# Patient Record
Sex: Female | Born: 1945 | Hispanic: No | Smoking: Former smoker
Health system: Southern US, Community
[De-identification: ages and names within clinical notes are randomized; demographics above are authoritative.]

## PROBLEM LIST (undated history)

## (undated) DIAGNOSIS — R0600 Dyspnea, unspecified: Secondary | ICD-10-CM

## (undated) DIAGNOSIS — I10 Essential (primary) hypertension: Secondary | ICD-10-CM

## (undated) DIAGNOSIS — I4891 Unspecified atrial fibrillation: Secondary | ICD-10-CM

## (undated) DIAGNOSIS — K900 Celiac disease: Secondary | ICD-10-CM

## (undated) DIAGNOSIS — R011 Cardiac murmur, unspecified: Secondary | ICD-10-CM

## (undated) DIAGNOSIS — R0609 Other forms of dyspnea: Secondary | ICD-10-CM

## (undated) DIAGNOSIS — K635 Polyp of colon: Secondary | ICD-10-CM

## (undated) DIAGNOSIS — M1990 Unspecified osteoarthritis, unspecified site: Secondary | ICD-10-CM

## (undated) DIAGNOSIS — I249 Acute ischemic heart disease, unspecified: Secondary | ICD-10-CM

## (undated) DIAGNOSIS — K219 Gastro-esophageal reflux disease without esophagitis: Secondary | ICD-10-CM

## (undated) DIAGNOSIS — Z8489 Family history of other specified conditions: Secondary | ICD-10-CM

## (undated) DIAGNOSIS — G4700 Insomnia, unspecified: Secondary | ICD-10-CM

## (undated) DIAGNOSIS — M4800 Spinal stenosis, site unspecified: Secondary | ICD-10-CM

## (undated) DIAGNOSIS — T7840XA Allergy, unspecified, initial encounter: Secondary | ICD-10-CM

## (undated) HISTORY — DX: Gastro-esophageal reflux disease without esophagitis: K21.9

## (undated) HISTORY — DX: Celiac disease: K90.0

## (undated) HISTORY — PX: CHOLECYSTECTOMY: SHX55

## (undated) HISTORY — DX: Polyp of colon: K63.5

## (undated) HISTORY — DX: Allergy, unspecified, initial encounter: T78.40XA

## (undated) HISTORY — DX: Insomnia, unspecified: G47.00

## (undated) HISTORY — PX: ABDOMINAL HYSTERECTOMY: SHX81

## (undated) HISTORY — DX: Unspecified osteoarthritis, unspecified site: M19.90

## (undated) HISTORY — DX: Other forms of dyspnea: R06.09

---
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
## 1967-06-02 HISTORY — PX: APPENDECTOMY: SHX54

## 1967-06-02 HISTORY — PX: CHOLECYSTECTOMY: SHX55

## 2000-07-20 DIAGNOSIS — I10 Essential (primary) hypertension: Secondary | ICD-10-CM | POA: Insufficient documentation

## 2000-07-20 HISTORY — DX: Essential (primary) hypertension: I10

## 2002-02-24 DIAGNOSIS — J309 Allergic rhinitis, unspecified: Secondary | ICD-10-CM | POA: Insufficient documentation

## 2002-02-24 HISTORY — DX: Allergic rhinitis, unspecified: J30.9

## 2002-02-28 DIAGNOSIS — M19041 Primary osteoarthritis, right hand: Secondary | ICD-10-CM

## 2002-02-28 DIAGNOSIS — G5603 Carpal tunnel syndrome, bilateral upper limbs: Secondary | ICD-10-CM | POA: Insufficient documentation

## 2002-02-28 DIAGNOSIS — M65319 Trigger thumb, unspecified thumb: Secondary | ICD-10-CM

## 2002-02-28 HISTORY — DX: Primary osteoarthritis, right hand: M19.041

## 2002-02-28 HISTORY — DX: Trigger thumb, unspecified thumb: M65.319

## 2002-02-28 HISTORY — DX: Carpal tunnel syndrome, bilateral upper limbs: G56.03

## 2004-03-04 DIAGNOSIS — N812 Incomplete uterovaginal prolapse: Secondary | ICD-10-CM

## 2004-03-04 HISTORY — DX: Incomplete uterovaginal prolapse: N81.2

## 2004-12-25 DIAGNOSIS — J45909 Unspecified asthma, uncomplicated: Secondary | ICD-10-CM

## 2004-12-25 HISTORY — DX: Unspecified asthma, uncomplicated: J45.909

## 2005-04-17 DIAGNOSIS — Z1211 Encounter for screening for malignant neoplasm of colon: Secondary | ICD-10-CM

## 2005-04-17 HISTORY — DX: Encounter for screening for malignant neoplasm of colon: Z12.11

## 2006-02-15 DIAGNOSIS — R053 Chronic cough: Secondary | ICD-10-CM | POA: Insufficient documentation

## 2006-02-15 HISTORY — DX: Chronic cough: R05.3

## 2006-05-20 DIAGNOSIS — I447 Left bundle-branch block, unspecified: Secondary | ICD-10-CM | POA: Insufficient documentation

## 2006-05-20 HISTORY — DX: Left bundle-branch block, unspecified: I44.7

## 2006-07-12 DIAGNOSIS — K900 Celiac disease: Secondary | ICD-10-CM | POA: Insufficient documentation

## 2008-06-01 HISTORY — PX: ABDOMINAL HYSTERECTOMY: SHX81

## 2008-12-20 DIAGNOSIS — Z860101 Personal history of adenomatous and serrated colon polyps: Secondary | ICD-10-CM

## 2008-12-20 DIAGNOSIS — Z86010 Personal history of colonic polyps: Secondary | ICD-10-CM | POA: Insufficient documentation

## 2008-12-20 HISTORY — DX: Personal history of colonic polyps: Z86.010

## 2008-12-20 HISTORY — DX: Personal history of adenomatous and serrated colon polyps: Z86.0101

## 2009-02-27 DIAGNOSIS — N8110 Cystocele, unspecified: Secondary | ICD-10-CM | POA: Insufficient documentation

## 2009-02-27 HISTORY — DX: Cystocele, unspecified: N81.10

## 2009-05-10 DIAGNOSIS — M5090 Cervical disc disorder, unspecified, unspecified cervical region: Secondary | ICD-10-CM | POA: Insufficient documentation

## 2009-05-10 HISTORY — DX: Cervical disc disorder, unspecified, unspecified cervical region: M50.90

## 2014-11-22 DIAGNOSIS — M5416 Radiculopathy, lumbar region: Secondary | ICD-10-CM

## 2014-11-22 DIAGNOSIS — M48061 Spinal stenosis, lumbar region without neurogenic claudication: Secondary | ICD-10-CM

## 2014-11-22 HISTORY — DX: Spinal stenosis, lumbar region without neurogenic claudication: M48.061

## 2014-11-22 HISTORY — DX: Radiculopathy, lumbar region: M54.16

## 2015-01-29 DIAGNOSIS — M51369 Other intervertebral disc degeneration, lumbar region without mention of lumbar back pain or lower extremity pain: Secondary | ICD-10-CM

## 2015-01-29 DIAGNOSIS — M5136 Other intervertebral disc degeneration, lumbar region: Secondary | ICD-10-CM

## 2015-01-29 HISTORY — DX: Other intervertebral disc degeneration, lumbar region: M51.36

## 2015-01-29 HISTORY — DX: Other intervertebral disc degeneration, lumbar region without mention of lumbar back pain or lower extremity pain: M51.369

## 2015-03-21 DIAGNOSIS — I340 Nonrheumatic mitral (valve) insufficiency: Secondary | ICD-10-CM

## 2015-03-21 DIAGNOSIS — Z8669 Personal history of other diseases of the nervous system and sense organs: Secondary | ICD-10-CM

## 2015-03-21 HISTORY — DX: Nonrheumatic mitral (valve) insufficiency: I34.0

## 2015-03-21 HISTORY — DX: Personal history of other diseases of the nervous system and sense organs: Z86.69

## 2015-04-01 DIAGNOSIS — G894 Chronic pain syndrome: Secondary | ICD-10-CM | POA: Insufficient documentation

## 2015-04-01 HISTORY — DX: Chronic pain syndrome: G89.4

## 2015-05-23 DIAGNOSIS — I119 Hypertensive heart disease without heart failure: Secondary | ICD-10-CM | POA: Insufficient documentation

## 2015-05-23 DIAGNOSIS — I517 Cardiomegaly: Secondary | ICD-10-CM

## 2015-05-23 HISTORY — DX: Hypertensive heart disease without heart failure: I11.9

## 2015-05-23 HISTORY — DX: Cardiomegaly: I51.7

## 2015-08-19 DIAGNOSIS — J301 Allergic rhinitis due to pollen: Secondary | ICD-10-CM | POA: Insufficient documentation

## 2015-08-19 DIAGNOSIS — F411 Generalized anxiety disorder: Secondary | ICD-10-CM | POA: Insufficient documentation

## 2015-08-19 HISTORY — DX: Generalized anxiety disorder: F41.1

## 2015-08-19 HISTORY — DX: Allergic rhinitis due to pollen: J30.1

## 2015-11-22 DIAGNOSIS — M810 Age-related osteoporosis without current pathological fracture: Secondary | ICD-10-CM | POA: Insufficient documentation

## 2015-11-22 DIAGNOSIS — M8580 Other specified disorders of bone density and structure, unspecified site: Secondary | ICD-10-CM

## 2015-11-22 HISTORY — DX: Other specified disorders of bone density and structure, unspecified site: M85.80

## 2015-11-22 HISTORY — DX: Age-related osteoporosis without current pathological fracture: M81.0

## 2015-11-26 DIAGNOSIS — G8929 Other chronic pain: Secondary | ICD-10-CM

## 2015-11-26 DIAGNOSIS — M5441 Lumbago with sciatica, right side: Secondary | ICD-10-CM | POA: Insufficient documentation

## 2015-11-26 HISTORY — DX: Other chronic pain: G89.29

## 2015-11-26 HISTORY — DX: Lumbago with sciatica, right side: M54.41

## 2015-12-30 ENCOUNTER — Ambulatory Visit (HOSPITAL_COMMUNITY)
Admission: EM | Admit: 2015-12-30 | Discharge: 2015-12-30 | Disposition: A | Discharge status: Home / Self Care | Payer: MEDICARE | Attending: Family Medicine | Admitting: Family Medicine

## 2015-12-30 ENCOUNTER — Encounter (HOSPITAL_COMMUNITY): Payer: Self-pay | Admitting: Emergency Medicine

## 2015-12-30 DIAGNOSIS — B029 Zoster without complications: Secondary | ICD-10-CM | POA: Diagnosis not present

## 2015-12-30 HISTORY — DX: Essential (primary) hypertension: I10

## 2015-12-30 HISTORY — DX: Spinal stenosis, site unspecified: M48.00

## 2015-12-30 MED ORDER — VALACYCLOVIR HCL 1 G PO TABS: 1000.0000 mg | ORAL_TABLET | Freq: Three times a day (TID) | ORAL | 0 refills | Status: AC

## 2015-12-30 MED ORDER — PREDNISONE 10 MG PO TABS: 20.0000 mg | ORAL_TABLET | Freq: Every day | ORAL | 0 refills | Status: DC

## 2015-12-30 MED ORDER — CAPSAICIN-MENTHOL-METHYL SAL 0.025-1-12 % EX CREA: 1.0000 | TOPICAL_CREAM | Freq: Four times a day (QID) | CUTANEOUS | 2 refills | Status: DC | PRN

## 2015-12-30 NOTE — ED Provider Notes (Signed)
CSN: [id number]     Arrival date & time 12/30/15  1153 History   None    No chief complaint on file.  (Consider location/radiation/quality/duration/timing/severity/associated sxs/prior Treatment) Patient awoke with painful rash right side of back 2 days ago.   The history is provided by the patient.  Rash  Location:  Torso Quality: blistering, burning, itchiness and redness   Severity:  Moderate Onset quality:  Sudden Duration:  2 days Timing:  Constant Progression:  Worsening Chronicity:  New Relieved by:  Nothing Worsened by:  Nothing Ineffective treatments:  None tried   No past medical history on file. No past surgical history on file. No family history on file. Social History  Substance Use Topics  . Smoking status: Not on file  . Smokeless tobacco: Not on file  . Alcohol use Not on file   OB History    No data available     Review of Systems  Constitutional: Negative.   HENT: Negative.   Eyes: Negative.   Respiratory: Negative.   Cardiovascular: Negative.   Gastrointestinal: Negative.   Endocrine: Negative.   Genitourinary: Negative.   Musculoskeletal: Negative.   Skin: Positive for rash.  Allergic/Immunologic: Negative.   Neurological: Positive for numbness.  Hematological: Negative.   Psychiatric/Behavioral: Negative.     Allergies  Review of patient's allergies indicates not on file.  Home Medications   Prior to Admission medications   Not on File   Meds Ordered and Administered this Visit  Medications - No data to display  BP 143/79 (BP Location: Left Arm)   Pulse 76   Temp 97.9 F (36.6 C) (Oral)   Resp 12   SpO2 95%  No data found.   Physical Exam  Constitutional: She appears well-developed and well-nourished.  HENT:  Head: Normocephalic and atraumatic.  Eyes: EOM are normal. Pupils are equal, round, and reactive to light.  Neck: Normal range of motion. Neck supple.  Cardiovascular: Normal rate, regular rhythm and normal  heart sounds.   Pulmonary/Chest: Effort normal and breath sounds normal.  Abdominal: Soft. Bowel sounds are normal.  Skin: Rash noted. There is erythema.  Erythematous vesicle patches right back  Nursing note and vitals reviewed.   Urgent Care Course   Clinical Course    Procedures (including critical care time)  Labs Review Labs Reviewed - No data to display  Imaging Review No results found.   Visual Acuity Review  Right Eye Distance:   Left Eye Distance:   Bilateral Distance:    Right Eye Near:   Left Eye Near:    Bilateral Near:         MDM  Herpes Zoster  Prednisone 10 mg 2 po bid x 4 days #16 Valtrex 1gram one po tid x 7 days #21 Continue using hydrocodone for pain Capsaicin cream apply  Qid prn pain #60 grams w/2rf      Lysbeth Penner, FNP 12/30/15 1305

## 2015-12-30 NOTE — ED Triage Notes (Signed)
Patient reports rash to right side of body noticed 7/29.  Patient concerned for shingles

## 2016-01-01 ENCOUNTER — Encounter (HOSPITAL_COMMUNITY): Payer: Self-pay | Admitting: Emergency Medicine

## 2016-01-01 ENCOUNTER — Ambulatory Visit (HOSPITAL_COMMUNITY)
Admission: EM | Admit: 2016-01-01 | Discharge: 2016-01-01 | Disposition: A | Discharge status: Home / Self Care | Payer: MEDICARE | Attending: Family Medicine | Admitting: Family Medicine

## 2016-01-01 DIAGNOSIS — K5903 Drug induced constipation: Secondary | ICD-10-CM

## 2016-01-01 DIAGNOSIS — B0229 Other postherpetic nervous system involvement: Secondary | ICD-10-CM | POA: Diagnosis not present

## 2016-01-01 MED ORDER — GABAPENTIN 300 MG PO CAPS: 300.0000 mg | ORAL_CAPSULE | Freq: Three times a day (TID) | ORAL | 0 refills | Status: DC

## 2016-01-01 MED ORDER — KETOROLAC TROMETHAMINE 30 MG/ML IJ SOLN
INTRAMUSCULAR | Status: AC
  Filled 2016-01-01: qty 1

## 2016-01-01 MED ORDER — KETOROLAC TROMETHAMINE 30 MG/ML IJ SOLN
30.0000 mg | Freq: Once | INTRAMUSCULAR | Status: AC
  Administered 2016-01-01: 30 mg via INTRAMUSCULAR

## 2016-01-01 NOTE — Discharge Instructions (Signed)
1. Drink at least 8 large glasses of water daily and continue regular activities as                 tolerated.  2. With each subsequent day with dealing with the constipation, please continue all the                ones that was/were started the previous day and add 1 new thing each day  A 1 cup of prune juice and orange juice (warmed) orally twice daily  B. mirlax 17 grams in large glass of water and follow with another glass of water                 up to twice daily.  C.senna-S 2 orally up to twice daily  D.mil of magnesia 30 ml orally up to twice daily  E. Fleet enema rectally as needed  3. Once your bowel has moved, you can cut down on the above to regular bowl habit. Most people should have a movement every 1-2 days 4. NOTE: Do not start a fiber supplement during an episode of constipation.

## 2016-01-01 NOTE — ED Triage Notes (Signed)
PT was seen here Monday and diagnosed with shingles. PT reports the pain medicine has made her constipated and she is having bad abdominal cramps. PT had a small BM today. PT has not taken pain medicine today due to constipation

## 2016-01-01 NOTE — ED Provider Notes (Signed)
CSN: [id number]     Arrival date & time 01/01/16  1218 History   First MD Initiated Contact with Patient 01/01/16 1400     Chief Complaint  Patient presents with  . Herpes Zoster   (Consider location/radiation/quality/duration/timing/severity/associated sxs/prior Treatment) HPI Patient is a 70 year old female with a history of shingles that was diagnosed at the urgent care yesterday. She states that she was offered an injection of ketorolac but declined at that time she would like to have it at this time. She also states that she is somewhat constipated from the hydrocodone that she takes on a regular basis states that she has used over-the-counter symptoms without any relief of symptoms at this time. Past Medical History:  Diagnosis Date  . Hypertension   . Spinal stenosis    Past Surgical History:  Procedure Laterality Date  . ABDOMINAL HYSTERECTOMY    . CHOLECYSTECTOMY     No family history on file. Social History  Substance Use Topics  . Smoking status: Former Smoker    Quit date: 12/31/1997  . Smokeless tobacco: Never Used  . Alcohol use No   OB History    No data available     Review of Systems  Denies: HEADACHE, NAUSEA,   CHEST PAIN, CONGESTION, DYSURIA, SHORTNESS OF BREATH  Allergies  Codeine  Home Medications   Prior to Admission medications   Medication Sig Start Date End Date Taking? Authorizing Provider  AMLODIPINE BESYLATE PO Take 25 mg by mouth.    Historical Provider, MD  Capsaicin-Menthol-Methyl Sal (CAPSAICIN-METHYL SAL-MENTHOL) 0.025-1-12 % CREA Apply 1 Tube topically 4 (four) times daily as needed. 12/30/15   Lysbeth Penner, FNP  hydrochlorothiazide (HYDRODIURIL) 50 MG tablet Take 50 mg by mouth daily.    Historical Provider, MD  HYDROcodone-acetaminophen (NORCO) 10-325 MG tablet Take 1 tablet by mouth every 6 (six) hours as needed.    Historical Provider, MD  hydrocortisone cream 0.5 % Apply 1 application topically 2 (two) times daily.    Historical  Provider, MD  ibuprofen (ADVIL,MOTRIN) 800 MG tablet Take 800 mg by mouth every 8 (eight) hours as needed.    Historical Provider, MD  LISINOPRIL PO Take 50 mg by mouth.    Historical Provider, MD  predniSONE (DELTASONE) 10 MG tablet Take 2 tablets (20 mg total) by mouth daily. 12/30/15   Lysbeth Penner, FNP  valACYclovir (VALTREX) 1000 MG tablet Take 1 tablet (1,000 mg total) by mouth 3 (three) times daily. 12/30/15 01/13/16  Lysbeth Penner, FNP   Meds Ordered and Administered this Visit  Medications - No data to display  BP 184/82   Pulse 99   Temp 98 F (36.7 C) (Oral)   Resp 16   SpO2 96%  No data found.   Physical Exam NURSES NOTES AND VITAL SIGNS REVIEWED. CONSTITUTIONAL: Well developed, well nourished, no acute distress HEENT: normocephalic, atraumatic EYES: Conjunctiva normal NECK:normal ROM, supple, no adenopathy PULMONARY:No respiratory distress, normal effort ABDOMINAL: Soft, ND, NT BS+, No CVAT, active shingle infection on the right abdomen. MUSCULOSKELETAL: Normal ROM of all extremities,  SKIN: warm and dry without rash PSYCHIATRIC: Mood and affect, behavior are normal  Urgent Care Course   Clinical Course    Procedures (including critical care time)  Labs Review Labs Reviewed - No data to display  Imaging Review No results found.   Visual Acuity Review  Right Eye Distance:   Left Eye Distance:   Bilateral Distance:    Right Eye Near:   Left Eye  Near:    Bilateral Near:        Gabapentin 300 mg provided  instructions on constipation care and relief are provided. MDM   1. Post herpetic neuralgia   2. Constipation due to pain medication     Patient is reassured that there are no issues that require transfer to higher level of care at this time or additional tests. Patient is advised to continue home symptomatic treatment. Patient is advised that if there are new or worsening symptoms to attend the emergency department, contact primary care  provider, or return to UC. Instructions of care provided discharged home in stable condition.    THIS NOTE WAS GENERATED USING A VOICE RECOGNITION SOFTWARE PROGRAM. ALL REASONABLE EFFORTS  WERE MADE TO PROOFREAD THIS DOCUMENT FOR ACCURACY.  I have verbally reviewed the discharge instructions with the patient. A printed AVS was given to the patient.  All questions were answered prior to discharge.      Konrad Felix, [place] 01/01/16 2048

## 2016-01-09 DIAGNOSIS — M5416 Radiculopathy, lumbar region: Secondary | ICD-10-CM | POA: Insufficient documentation

## 2016-01-09 HISTORY — DX: Radiculopathy, lumbar region: M54.16

## 2016-01-31 HISTORY — PX: SPINAL FUSION: SHX223

## 2016-11-04 DIAGNOSIS — G5631 Lesion of radial nerve, right upper limb: Secondary | ICD-10-CM

## 2016-11-04 HISTORY — DX: Lesion of radial nerve, right upper limb: G56.31

## 2017-07-27 ENCOUNTER — Ambulatory Visit (HOSPITAL_COMMUNITY)
Admission: EM | Admit: 2017-07-27 | Discharge: 2017-07-27 | Disposition: A | Discharge status: Home / Self Care | Payer: MEDICARE | Attending: Family Medicine | Admitting: Family Medicine

## 2017-07-27 ENCOUNTER — Encounter (HOSPITAL_COMMUNITY): Payer: Self-pay | Admitting: Emergency Medicine

## 2017-07-27 ENCOUNTER — Other Ambulatory Visit: Payer: Self-pay

## 2017-07-27 DIAGNOSIS — B029 Zoster without complications: Secondary | ICD-10-CM

## 2017-07-27 MED ORDER — VALACYCLOVIR HCL 1 G PO TABS: 1000.0000 mg | ORAL_TABLET | Freq: Three times a day (TID) | ORAL | 0 refills | Status: AC

## 2017-07-27 NOTE — ED Triage Notes (Addendum)
Rash on left hip.  Patient reports a rash on forehead last night.  Rash to left hip today and various areas are itching  Patient had a steroid injection to her back and hand last week

## 2017-07-27 NOTE — ED Provider Notes (Signed)
  [place]   [id number] 07/27/17 Arrival Time: 8270  ASSESSMENT & PLAN:  1. Herpes zoster without complication   -likely  Meds ordered this encounter  Medications  . valACYclovir (VALTREX) 1000 MG tablet    Sig: Take 1 tablet (1,000 mg total) by mouth 3 (three) times daily for 7 days.    Dispense:  21 tablet    Refill:  0   Prefers OTC analgesics if needed.  Reviewed expectations re: course of current medical issues. Questions answered. Outlined signs and symptoms indicating need for more acute intervention. Patient verbalized understanding. After Visit Summary given.   SUBJECTIVE:  Deanna [name] is a 72 y.o. female who presents with concern over possible shingles. History of in the past many years ago. Over the past 1-2 days she has felt a slight burning sensation of her L side at waistband area. Noticed a couple of small "bumps" there yesterday. Afebrile. No recent illnesses. No analgesics needed. "Just a little uncomfortable." No specific aggravating or alleviating factors reported.   ROS: As per HPI.  OBJECTIVE: Vitals:   07/27/17 1402 07/27/17 1404  BP:  135/60  Pulse: 86 87  Resp: 20 20  Temp: 98.1 F (36.7 C) 98.1 F (36.7 C)  TempSrc: Oral Oral  SpO2: 100% 100%    General appearance: alert; no distress Lungs: clear to auscultation bilaterally Heart: regular rate and rhythm Extremities: no edema Skin: warm and dry; a couple of nonspecific 2-75m raised, round lesions at L waistband; no definite vesicles; slight tenderness to touch Psychological: alert and cooperative; normal mood and affect  Allergies  Allergen Reactions  . Codeine   . Gabapentin     Past Medical History:  Diagnosis Date  . Hypertension   . Spinal stenosis    Social History   Socioeconomic History  . Marital status: Divorced    Spouse name: Not on file  . Number of children: Not on file  . Years of education: Not on file  . Highest education level: Not on file    Social Needs  . Financial resource strain: Not on file  . Food insecurity - worry: Not on file  . Food insecurity - inability: Not on file  . Transportation needs - medical: Not on file  . Transportation needs - non-medical: Not on file  Occupational History  . Not on file  Tobacco Use  . Smoking status: Former Smoker    Last attempt to quit: 12/31/1997    Years since quitting: 19.5  . Smokeless tobacco: Never Used  Substance and Sexual Activity  . Alcohol use: No  . Drug use: No  . Sexual activity: Not on file  Other Topics Concern  . Not on file  Social History Narrative  . Not on file    Past Surgical History:  Procedure Laterality Date  . ABDOMINAL HYSTERECTOMY    . CRosaura Carpenter MD 07/27/17 [phone]

## 2018-04-26 ENCOUNTER — Ambulatory Visit (HOSPITAL_COMMUNITY)
Admission: EM | Admit: 2018-04-26 | Discharge: 2018-04-26 | Disposition: A | Discharge status: Home / Self Care | Payer: MEDICARE

## 2018-04-26 NOTE — ED Triage Notes (Signed)
Pt to go to ED for further eval

## 2019-07-05 ENCOUNTER — Encounter: Payer: Self-pay | Admitting: Physician Assistant

## 2019-07-05 ENCOUNTER — Ambulatory Visit (INDEPENDENT_AMBULATORY_CARE_PROVIDER_SITE_OTHER): Payer: MEDICARE | Admitting: Physician Assistant

## 2019-07-05 VITALS — Ht 63.0 in | Wt 159.0 lb

## 2019-07-05 DIAGNOSIS — H9313 Tinnitus, bilateral: Secondary | ICD-10-CM

## 2019-07-05 DIAGNOSIS — K219 Gastro-esophageal reflux disease without esophagitis: Secondary | ICD-10-CM

## 2019-07-05 DIAGNOSIS — H903 Sensorineural hearing loss, bilateral: Secondary | ICD-10-CM

## 2019-07-05 DIAGNOSIS — I10 Essential (primary) hypertension: Secondary | ICD-10-CM

## 2019-07-05 HISTORY — DX: Sensorineural hearing loss, bilateral: H90.3

## 2019-07-05 HISTORY — DX: Tinnitus, bilateral: H93.13

## 2019-07-05 NOTE — Progress Notes (Signed)
I have discussed the procedure for the virtual visit with the patient who has given consent to proceed with assessment and treatment.   Jeiden Daughtridge S Deontez Klinke, CMA     

## 2019-07-05 NOTE — Progress Notes (Signed)
Virtual Visit via Video   I connected with patient on 07/05/19 at 10:30 AM EST by a video enabled telemedicine application and verified that I am speaking with the correct Deanna [name] using two identifiers.  Location patient: Home Location provider: [facility], Office Persons participating in the virtual visit: Patient, Provider, [place] (Patina Moore)  I discussed the limitations of evaluation and management by telemedicine and the availability of in [facility] appointments. The patient expressed understanding and agreed to proceed.  Subjective:   HPI:   Patient presents via doxy.need today to establish care.  Previously seen by Dr. Larene Beach.  Records have been requested.  Patient with history of hypertension, LBBB and VH, followed by cardiology.  Patient is currently on a regimen of amlodipine 10 mg daily, hydrochlorothiazide 25 mg twice daily and losartan 50 mg daily.  Endorses taking medications as directed.  States she feels like she cannot keep hydrated, with intermittent dry mouth and chapped lips.  Does states she tries to keep well-hydrated by drinking adequate amounts of water.  Denies any vomiting or diarrhea. Patient denies chest pain, palpitations, lightheadedness, dizziness, vision changes or frequent headaches.  Patient also noted to episodes of gagging due to some phlegm noted in the back of her throat.  Takes aloe vera which helps some with this.  Denies any chest congestion, chest tightness or wheezing.  Denies any voice hoarseness.  Denies nasal drainage.  Denies any noted heartburn but does have history of reflux.  Some occasional globus sensation.  Denies any PND orthopnea.  ROS:   See pertinent positives and negatives per HPI.  There are no problems to display for this patient.   Social History   Tobacco Use  . Smoking status: Former Smoker    Types: Cigarettes    Quit date: 12/31/1997    Years since quitting: 21.5  . Smokeless tobacco: Never Used  Substance  Use Topics  . Alcohol use: No    Current Outpatient Medications:  .  albuterol (VENTOLIN HFA) 108 (90 Base) MCG/ACT inhaler, Inhale into the lungs., Disp: , Rfl:  .  amLODipine (NORVASC) 10 MG tablet, TAKE 1 TABLET BY MOUTH EVERY DAY, Disp: , Rfl:  .  Ascorbic Acid (VITAMIN C) 1000 MG tablet, Take 1,000 mg by mouth daily., Disp: , Rfl:  .  azelastine (ASTELIN) 0.1 % nasal spray, Place into both nostrils 2 (two) times daily. Use in each nostril as directed, Disp: , Rfl:  .  Bromelains 500 MG TABS, Take by mouth., Disp: , Rfl:  .  Capsaicin-Menthol-Methyl Sal (CAPSAICIN-METHYL SAL-MENTHOL) 0.025-1-12 % CREA, Apply 1 Tube topically 4 (four) times daily as needed., Disp: 60 g, Rfl: 2 .  Cholecalciferol (VITAMIN D PO), Take 1,000 tablets by mouth daily. , Disp: , Rfl:  .  Cyanocobalamin (VITAMIN B 12 PO), Take by mouth., Disp: , Rfl:  .  hydrochlorothiazide (HYDRODIURIL) 25 MG tablet, Take 25 mg by mouth 2 (two) times daily., Disp: , Rfl:  .  hydrocortisone 2.5 % cream, Apply topically to rectal area BID prn., Disp: , Rfl:  .  LORazepam (ATIVAN) 0.5 MG tablet, Take 0.5 mg by mouth every 8 (eight) hours., Disp: , Rfl:  .  losartan (COZAAR) 50 MG tablet, Take 50 mg by mouth daily., Disp: , Rfl:  .  montelukast (SINGULAIR) 10 MG tablet, Take 10 mg by mouth at bedtime., Disp: , Rfl:  .  Multiple Vitamin (MULTI-VITAMIN) tablet, Take by mouth., Disp: , Rfl:  .  triamcinolone cream (KENALOG) 0.1 %,  Apply topically 2 (two) times daily., Disp: , Rfl:   Allergies  Allergen Reactions  . Gluten Meal Diarrhea and Other (See Comments)    Severe diarrhea per member   . Codeine   . Gabapentin   . Terbinafine Hcl Rash    Objective:   Ht _0  (1.6 m)   Wt 159 lb (72.1 kg)   BMI 28.17 kg/m   Patient is well-developed, well-nourished in no acute distress.  Resting comfortably at home.  Head is normocephalic, atraumatic.  No labored breathing.  Speech is clear and coherent with logical content.    Patient is alert and oriented at baseline.   Assessment and Plan:   1. Essential hypertension BP stable per patient report.  Little bit of concern of dryness from such a high dose of hydrochlorothiazide.  We will have her continue amlodipine and losartan at current doses.  Decrease HCTZ to once daily dosing.  Continue good hydration.  Continue DASH diet.  Lab visit scheduled to check electrolytes.  We will have nurse check BP at that time. - Comp Met (CMET); Future  2. Gastroesophageal reflux disease without esophagitis Suspect silent reflux as cause of her symptoms.  Dietary measures discussed.  Start Protonix once daily for 2-week trial.  Follow-up in office if symptoms do not improving/resolving.  Patient to schedule CPE/MWV when due.    Leeanne Rio, [place] 07/05/2019

## 2019-07-07 ENCOUNTER — Telehealth: Payer: Self-pay | Admitting: *Deleted

## 2019-07-07 MED ORDER — PANTOPRAZOLE SODIUM 40 MG PO TBEC: 40.0000 mg | DELAYED_RELEASE_TABLET | Freq: Every day | ORAL | 3 refills | Status: DC

## 2019-07-07 NOTE — Telephone Encounter (Signed)
I have resubmitted the Rx to her pharmacy. Thank you for bringing this to my attention.

## 2019-07-07 NOTE — Telephone Encounter (Signed)
Pt.notified

## 2019-07-07 NOTE — Telephone Encounter (Signed)
Pt stated per last visit Rx for acid reflux was supposed to be send to her pharmacy [facility]  In [place] Rx was not received. If is possible can it be send to her pharmacy.

## 2019-07-11 ENCOUNTER — Telehealth: Payer: Self-pay | Admitting: Physician Assistant

## 2019-07-11 NOTE — Telephone Encounter (Signed)
Let's see how she does overnight and check in tomorrow at her lab appointment. If there is any fever, URI symptoms developed we will need to reschedule the lab appointment.   Her lab orders are in the system to include routine labs due to her BP medications. She had mentioned wanting an A1C checked. Her insurance does not cover that as a routine screen but if her glucose levels (which we will check) is elevated, insurance will allow me to add-on an A1C.

## 2019-07-11 NOTE — Telephone Encounter (Signed)
Pt called afterhours/on call service 2/8 at 10pm. Started Pantoprazole on Saturday, started diarrhea Sunday and vomiting Monday. On call provider advised to push fluids at home and follow up with PCP, go to ER if symptoms worsened during the night.

## 2019-07-11 NOTE — Telephone Encounter (Signed)
Spoke with patient about her symptoms. Her vomiting and dry heaving stopped about 10:30pm last night. She is able to keep fluid down this morning. Advised to stopped the Protonix. Last dose of Protonix was yesterday at 10 am.  Has not had anymore diarrhea.  She does have a lab appointment scheduled 07/12/19 at 11 am.

## 2019-07-11 NOTE — Telephone Encounter (Signed)
Please call to check on patient today to see how she is feeling. Make sure she has stopped the Protonix.

## 2019-07-12 ENCOUNTER — Ambulatory Visit (INDEPENDENT_AMBULATORY_CARE_PROVIDER_SITE_OTHER): Payer: MEDICARE | Admitting: *Deleted

## 2019-07-12 ENCOUNTER — Other Ambulatory Visit: Payer: Self-pay

## 2019-07-12 DIAGNOSIS — I10 Essential (primary) hypertension: Secondary | ICD-10-CM | POA: Diagnosis not present

## 2019-07-12 LAB — COMPREHENSIVE METABOLIC PANEL
ALT: 20 U/L (ref 0–35)
AST: 17 U/L (ref 0–37)
Albumin: 4.3 g/dL (ref 3.5–5.2)
Alkaline Phosphatase: 53 U/L (ref 39–117)
BUN: 16 mg/dL (ref 6–23)
CO2: 31 mEq/L (ref 19–32)
Calcium: 9.5 mg/dL (ref 8.4–10.5)
Chloride: 102 mEq/L (ref 96–112)
Creatinine, Ser: 0.61 mg/dL (ref 0.40–1.20)
GFR: 96.34 mL/min (ref >60.00)
Glucose, Bld: 97 mg/dL (ref 70–99)
Potassium: 3.7 mEq/L (ref 3.5–5.1)
Sodium: 138 mEq/L (ref 135–145)
Total Bilirubin: 0.5 mg/dL (ref 0.2–1.2)
Total Protein: 6.9 g/dL (ref 6.0–8.3)

## 2019-07-12 NOTE — Telephone Encounter (Signed)
[facility] [employer] manager did ask all the COVID questions and patient stated she was not having any symptoms. Her previous symptoms vomiting and diarrhea has improved with stopping the Protonix

## 2019-07-12 NOTE — Progress Notes (Signed)
Pt present for BP check  Stated taking medication as prescribed

## 2019-08-02 DIAGNOSIS — M461 Sacroiliitis, not elsewhere classified: Secondary | ICD-10-CM | POA: Insufficient documentation

## 2019-08-02 HISTORY — DX: Sacroiliitis, not elsewhere classified: M46.1

## 2019-08-14 ENCOUNTER — Encounter: Payer: Self-pay | Admitting: Nurse Practitioner

## 2019-08-14 ENCOUNTER — Other Ambulatory Visit: Payer: Self-pay

## 2019-08-14 ENCOUNTER — Ambulatory Visit (INDEPENDENT_AMBULATORY_CARE_PROVIDER_SITE_OTHER): Payer: MEDICARE | Admitting: Nurse Practitioner

## 2019-08-14 VITALS — BP 122/72 | HR 88 | Temp 98.2°F | Ht 61.7 in | Wt 158.8 lb

## 2019-08-14 DIAGNOSIS — R7309 Other abnormal glucose: Secondary | ICD-10-CM | POA: Diagnosis not present

## 2019-08-14 DIAGNOSIS — I10 Essential (primary) hypertension: Secondary | ICD-10-CM | POA: Diagnosis not present

## 2019-08-14 DIAGNOSIS — K648 Other hemorrhoids: Secondary | ICD-10-CM | POA: Diagnosis not present

## 2019-08-14 DIAGNOSIS — M48061 Spinal stenosis, lumbar region without neurogenic claudication: Secondary | ICD-10-CM | POA: Diagnosis not present

## 2019-08-14 MED ORDER — HYDROCORTISONE ACETATE 25 MG RE SUPP: 25.0000 mg | Freq: Two times a day (BID) | RECTAL | 0 refills | Status: DC

## 2019-08-14 NOTE — Progress Notes (Signed)
This visit occurred during the SARS-CoV-2 public health emergency.  Safety protocols were in place, including screening questions prior to the visit, additional usage of staff PPE, and extensive cleaning of exam room while observing appropriate contact time as indicated for disinfecting solutions.  Subjective:     Patient ID: Deanna [name] , female    DOB: 1945/12/07 , 74 y.o.   MRN: [id number]   Chief Complaint  Patient presents with  . Establish Care  . Referral    patient wants to switch her doctors to one here she currently see someone here in [place]  . hemorriods  . disgestive issues    patient stated she keeps swallowing some mucus she stated it is different form her sinuses and it mkaes her cough    HPI  Here to establish care - was referred by Jaquelyn Bitter. She has been with the [facility] since 2016 since she relocated here from [place]. She relocated here after - dx with spinal stenosis, her daughter was here at [facility].  Divorced. She worked with [place] work. She has 2 daughters one still in [place] with 4 total grandchildren.    PMH - Spinal stenosis 2016, she had spinal fusion surgery at [facility] in 2017 was not 100% successful, continues to have leg pain done steroid shots last on Jan 28th.  She would like to have her spinal care transferred from [facility].  She is going to [facility] on [address] for her physical therapy - this is through [facility].  HTN (medications) she has a myocardial bridge (mitral valve regurgitation), she had seen a cardiologist in the past - 2007. Celiac disease in 2008 - has seen a GI provider. [facility]  She had a celiac workup last year with Dr. Thana Farr.  She does not eat gluten which has helped. She is also having digestion issues with gagging. She was told she had pancreatitis but did not have any treatments.  She feels like something is going on with her digestive tract.  She is not eating  cheese.    She reports a history of internal hemorrhoids - she feels she has to cleanse herself to help with the hemorrhoids.   Gastroesophageal Reflux She reports no abdominal pain, no coughing, no heartburn or no sore throat. She has tried a histamine-2 antagonist (pantoprazole - unable to take due to causing bleeding and diarrhea.) for the symptoms. The treatment provided no relief.     Past Medical History:  Diagnosis Date  . Arthritis   . Celiac disease   . Colon polyps   . GERD (gastroesophageal reflux disease)   . Hypertension   . Spinal stenosis      Family History  Problem Relation Age of Onset  . Arthritis Mother   . Cancer Mother   . Diabetes Mother   . Early death Father   . Cancer Father   . Early death Sister   . Cancer Sister   . Diabetes Sister      Current Outpatient Medications:  .  albuterol (VENTOLIN HFA) 108 (90 Base) MCG/ACT inhaler, Inhale into the lungs., Disp: , Rfl:  .  amLODipine (NORVASC) 10 MG tablet, TAKE 1 TABLET BY MOUTH EVERY DAY, Disp: , Rfl:  .  Ascorbic Acid (VITAMIN C) 1000 MG tablet, Take 1,000 mg by mouth daily., Disp: , Rfl:  .  azelastine (ASTELIN) 0.1 % nasal spray, Place into both nostrils 2 (two) times daily. Use in  each nostril as directed, Disp: , Rfl:  .  Bromelains 500 MG TABS, Take by mouth., Disp: , Rfl:  .  Cholecalciferol (VITAMIN D PO), Take 1,000 tablets by mouth daily. , Disp: , Rfl:  .  Cyanocobalamin (VITAMIN B 12 PO), Take by mouth., Disp: , Rfl:  .  hydrochlorothiazide (HYDRODIURIL) 25 MG tablet, Take 25 mg by mouth daily. , Disp: , Rfl:  .  hydrocortisone 2.5 % cream, Apply topically to rectal area BID prn., Disp: , Rfl:  .  LORazepam (ATIVAN) 0.5 MG tablet, Take 0.5 mg by mouth every 8 (eight) hours., Disp: , Rfl:  .  losartan (COZAAR) 50 MG tablet, Take 50 mg by mouth daily., Disp: , Rfl:  .  montelukast (SINGULAIR) 10 MG tablet, Take 10 mg by mouth at bedtime., Disp: , Rfl:  .  Multiple Vitamin (MULTI-VITAMIN)  tablet, Take by mouth., Disp: , Rfl:  .  Capsaicin-Menthol-Methyl Sal (CAPSAICIN-METHYL SAL-MENTHOL) 0.025-1-12 % CREA, Apply 1 Tube topically 4 (four) times daily as needed. (Patient not taking: Reported on 08/14/2019), Disp: 60 g, Rfl: 2 .  pantoprazole (PROTONIX) 40 MG tablet, Take 1 tablet (40 mg total) by mouth daily. (Patient not taking: Reported on 08/14/2019), Disp: 30 tablet, Rfl: 3 .  triamcinolone cream (KENALOG) 0.1 %, Apply topically 2 (two) times daily., Disp: , Rfl:    Allergies  Allergen Reactions  . Gluten Meal Diarrhea and Other (See Comments)    Severe diarrhea per member   . Codeine   . Gabapentin   . Terbinafine Hcl Rash     Review of Systems  Constitutional: Negative.   HENT: Negative for sore throat.   Respiratory: Negative.  Negative for cough.   Gastrointestinal: Negative for abdominal pain and heartburn.       Reports having hemorrhoids and the need to cleanse regularly.   Neurological: Negative for dizziness and headaches.  Psychiatric/Behavioral: Negative.      Today's Vitals   08/14/19 1137  BP: 122/72  Pulse: 88  Temp: 98.2 F (36.8 C)  TempSrc: Oral  SpO2: 95%  Weight: 158 lb 12.8 oz (72 kg)  Height: 5' 1.7" (1.567 m)  PainSc: 0-No pain   Body mass index is 29.33 kg/m.   Objective:  Physical Exam Constitutional:      General: She is not in acute distress.    Appearance: Normal appearance.  Cardiovascular:     Rate and Rhythm: Normal rate and regular rhythm.     Pulses: Normal pulses.     Heart sounds: Normal heart sounds. No murmur.  Pulmonary:     Effort: Pulmonary effort is normal.     Breath sounds: Normal breath sounds.  Skin:    Capillary Refill: Capillary refill takes less than 2 seconds.  Neurological:     General: No focal deficit present.     Mental Status: She is alert and oriented to [place], place, and time.  Psychiatric:        Mood and Affect: Mood normal.        Behavior: Behavior normal.        Thought Content:  Thought content normal.        Judgment: Judgment normal.         Assessment And Plan:     1. Spinal stenosis, lumbar region without neurogenic claudication   - Ambulatory referral to Spine Surgery  2. Essential hypertension . B/P is well controlled.  . CMP ordered to check renal function.  . The importance of regular  exercise and dietary modification was stressed to the patient.  - CMP14+EGFR - Amylase  3. Abnormal glucose  Chronic, controlled  Continue with current medications  Encouraged to limit intake of sugary foods and drinks  Encouraged to increase physical activity to 150 minutes per week as tolerated - Hemoglobin A1c - CBC  4. Internal hemorrhoids  She reports a history of internal hemorrhoids  Will treat with anusol to see if this is effective. - hydrocortisone (ANUSOL-HC) 25 MG suppository; Place 1 suppository (25 mg total) rectally 2 (two) times daily.  Dispense: 12 suppository; Refill: 0   [name], FNP    THE PATIENT IS ENCOURAGED TO PRACTICE SOCIAL DISTANCING DUE TO THE COVID-19 PANDEMIC.

## 2019-08-15 LAB — CMP14+EGFR
ALT: 22 IU/L (ref 0–32)
AST: 19 IU/L (ref 0–40)
Albumin/Globulin Ratio: 1.8 (ref 1.2–2.2)
Albumin: 4.5 g/dL (ref 3.7–4.7)
Alkaline Phosphatase: 72 IU/L (ref 39–117)
BUN/Creatinine Ratio: 17 (ref 12–28)
BUN: 11 mg/dL (ref 8–27)
Bilirubin Total: 0.4 mg/dL (ref 0.0–1.2)
CO2: 27 mmol/L (ref 20–29)
Calcium: 10 mg/dL (ref 8.7–10.3)
Chloride: 101 mmol/L (ref 96–106)
Creatinine, Ser: 0.65 mg/dL (ref 0.57–1.00)
GFR calc Af Amer: 103 mL/min/{1.73_m2} (ref >59)
GFR calc non Af Amer: 89 mL/min/{1.73_m2} (ref >59)
Globulin, Total: 2.5 g/dL (ref 1.5–4.5)
Glucose: 88 mg/dL (ref 65–99)
Potassium: 4 mmol/L (ref 3.5–5.2)
Sodium: 142 mmol/L (ref 134–144)
Total Protein: 7 g/dL (ref 6.0–8.5)

## 2019-08-15 LAB — HEMOGLOBIN A1C
Est. average glucose Bld gHb Est-mCnc: 126 mg/dL
Hgb A1c MFr Bld: 6 % — ABNORMAL HIGH (ref 4.8–5.6)

## 2019-08-15 LAB — CBC
Hematocrit: 42 % (ref 34.0–46.6)
Hemoglobin: 14.2 g/dL (ref 11.1–15.9)
MCH: 27.7 pg (ref 26.6–33.0)
MCHC: 33.8 g/dL (ref 31.5–35.7)
MCV: 82 fL (ref 79–97)
Platelets: 265 10*3/uL (ref 150–450)
RBC: 5.12 x10E6/uL (ref 3.77–5.28)
RDW: 13.9 % (ref 11.7–15.4)
WBC: 8.8 10*3/uL (ref 3.4–10.8)

## 2019-08-15 LAB — AMYLASE: Amylase: 47 U/L (ref 31–110)

## 2019-08-28 ENCOUNTER — Encounter: Payer: Self-pay | Admitting: Nurse Practitioner

## 2019-09-12 ENCOUNTER — Other Ambulatory Visit: Payer: Self-pay

## 2019-09-12 MED ORDER — DEXILANT 60 MG PO CPDR: 60.0000 mg | DELAYED_RELEASE_CAPSULE | Freq: Every day | ORAL | 0 refills | Status: DC

## 2019-09-12 NOTE — Progress Notes (Signed)
xi

## 2019-09-20 ENCOUNTER — Telehealth: Payer: Self-pay

## 2019-09-20 NOTE — Telephone Encounter (Signed)
I left pt v/m to call the office we needed to clarify whether she is taking the dexliant or pantoprazole because she cant take both. YL,RMA

## 2019-09-25 ENCOUNTER — Other Ambulatory Visit: Payer: Self-pay

## 2019-09-25 ENCOUNTER — Ambulatory Visit (INDEPENDENT_AMBULATORY_CARE_PROVIDER_SITE_OTHER): Payer: MEDICARE | Admitting: Nurse Practitioner

## 2019-09-25 ENCOUNTER — Encounter: Payer: Self-pay | Admitting: Nurse Practitioner

## 2019-09-25 VITALS — BP 112/80 | HR 83 | Temp 98.3°F | Ht 61.4 in | Wt 156.8 lb

## 2019-09-25 DIAGNOSIS — R413 Other amnesia: Secondary | ICD-10-CM | POA: Diagnosis not present

## 2019-09-25 DIAGNOSIS — K219 Gastro-esophageal reflux disease without esophagitis: Secondary | ICD-10-CM

## 2019-09-25 DIAGNOSIS — K648 Other hemorrhoids: Secondary | ICD-10-CM

## 2019-09-25 DIAGNOSIS — G4700 Insomnia, unspecified: Secondary | ICD-10-CM | POA: Diagnosis not present

## 2019-09-25 MED ORDER — BELSOMRA 5 MG PO TABS: 1.0000 | ORAL_TABLET | Freq: Every evening | ORAL | 1 refills | Status: DC | PRN

## 2019-09-25 MED ORDER — HYDROCORTISONE ACETATE 25 MG RE SUPP: 25.0000 mg | Freq: Two times a day (BID) | RECTAL | 3 refills | Status: DC | PRN

## 2019-09-25 NOTE — Progress Notes (Signed)
This visit occurred during the SARS-CoV-2 public health emergency.  Safety protocols were in place, including screening questions prior to the visit, additional usage of staff PPE, and extensive cleaning of exam room while observing appropriate contact time as indicated for disinfecting solutions.  Subjective:     Patient ID: Deanna [name] , female    DOB: 1945-12-04 , 74 y.o.   MRN: [id number]   Chief Complaint  Patient presents with  . Gastroesophageal Reflux    Patient presents today for a 4-6 week med check on dexilant she stated she has been having nausea and gagging    HPI  Recently she is having gagging.   She had improved bowels where she was not having an incomplete bowel movement.  She is having episodes in the last couple of days of incomplete emptying  Gastroesophageal Reflux She reports no abdominal pain, no chest pain, no coughing or no nausea. This is a chronic problem. The current episode started more than 1 year ago. The problem occurs constantly. The problem has been waxing and waning. Pertinent negatives include no weight loss. She has tried a PPI for the symptoms. The treatment provided moderate relief.  Insomnia Primary symptoms: sleep disturbance, no difficulty falling asleep, no malaise/fatigue.  The current episode started more than one month.     Past Medical History:  Diagnosis Date  . Arthritis   . Celiac disease   . Colon polyps   . GERD (gastroesophageal reflux disease)   . Hypertension   . Spinal stenosis      Family History  Problem Relation Age of Onset  . Arthritis Mother   . Cancer Mother   . Diabetes Mother   . Early death Father   . Cancer Father   . Early death Sister   . Cancer Sister   . Diabetes Sister      Current Outpatient Medications:  .  albuterol (VENTOLIN HFA) 108 (90 Base) MCG/ACT inhaler, Inhale into the lungs., Disp: , Rfl:  .  amLODipine (NORVASC) 10 MG tablet, TAKE 1 TABLET BY MOUTH EVERY DAY, Disp: , Rfl:  .   Ascorbic Acid (VITAMIN C) 1000 MG tablet, Take 1,000 mg by mouth daily., Disp: , Rfl:  .  azelastine (ASTELIN) 0.1 % nasal spray, Place into both nostrils 2 (two) times daily. Use in each nostril as directed, Disp: , Rfl:  .  Cholecalciferol (VITAMIN D PO), Take 1,000 tablets by mouth daily. , Disp: , Rfl:  .  Cyanocobalamin (VITAMIN B 12 PO), Take by mouth., Disp: , Rfl:  .  dexlansoprazole (DEXILANT) 60 MG capsule, Take 1 capsule (60 mg total) by mouth daily., Disp: 90 capsule, Rfl: 0 .  hydrochlorothiazide (HYDRODIURIL) 25 MG tablet, Take 25 mg by mouth daily. , Disp: , Rfl:  .  hydrocortisone (ANUSOL-HC) 25 MG suppository, Place 1 suppository (25 mg total) rectally 2 (two) times daily., Disp: 12 suppository, Rfl: 0 .  LORazepam (ATIVAN) 0.5 MG tablet, Take 0.5 mg by mouth every 8 (eight) hours., Disp: , Rfl:  .  losartan (COZAAR) 50 MG tablet, Take 50 mg by mouth daily., Disp: , Rfl:  .  montelukast (SINGULAIR) 10 MG tablet, Take 10 mg by mouth at bedtime., Disp: , Rfl:  .  Multiple Vitamin (MULTI-VITAMIN) tablet, Take by mouth., Disp: , Rfl:  .  triamcinolone cream (KENALOG) 0.1 %, Apply topically 2 (two) times daily., Disp: , Rfl:  .  Bromelains 500 MG TABS, Take by mouth., Disp: , Rfl:  .  Capsaicin-Menthol-Methyl  Sal (CAPSAICIN-METHYL SAL-MENTHOL) 0.025-1-12 % CREA, Apply 1 Tube topically 4 (four) times daily as needed. (Patient not taking: Reported on 08/14/2019), Disp: 60 g, Rfl: 2   Allergies  Allergen Reactions  . Gluten Meal Diarrhea and Other (See Comments)    Severe diarrhea per member   . Codeine   . Gabapentin   . Terbinafine Hcl Rash     Review of Systems  Constitutional: Negative.  Negative for malaise/fatigue and weight loss.  Respiratory: Negative.  Negative for cough.   Cardiovascular: Negative.  Negative for chest pain, palpitations and leg swelling.  Gastrointestinal: Negative for abdominal pain, diarrhea and nausea.  Neurological: Negative for dizziness and  headaches.  Psychiatric/Behavioral: Positive for sleep disturbance. The patient has insomnia. The patient is not nervous/anxious.      Today's Vitals   09/25/19 1149  BP: 112/80  Pulse: 83  Temp: 98.3 F (36.8 C)  TempSrc: Oral  SpO2: 97%  Weight: 156 lb 12.8 oz (71.1 kg)  Height: 5' 1.4" (1.56 m)  PainSc: 2   PainLoc: Back   Body mass index is 29.24 kg/m.   Objective:  Physical Exam Constitutional:      General: She is not in acute distress.    Appearance: Normal appearance. She is obese.  Cardiovascular:     Rate and Rhythm: Normal rate and regular rhythm.     Pulses: Normal pulses.     Heart sounds: Normal heart sounds. No murmur.  Skin:    General: Skin is warm.     Capillary Refill: Capillary refill takes less than 2 seconds.  Neurological:     General: No focal deficit present.     Mental Status: She is alert and oriented to Deanna [name], place, and time.     Cranial Nerves: No cranial nerve deficit.  Psychiatric:        Mood and Affect: Mood normal.        Behavior: Behavior normal.        Thought Content: Thought content normal.        Judgment: Judgment normal.       Assessment And Plan:     1. Internal hemorrhoids  She is doing well with the anusol can take as needed.  - hydrocortisone (ANUSOL-HC) 25 MG suppository; Place 1 suppository (25 mg total) rectally 2 (two) times daily as needed for hemorrhoids or anal itching.  Dispense: 12 suppository; Refill: 3  3. Gastroesophageal reflux disease without esophagitis  She has seen 2 GI   3. Insomnia, unspecified type  She has taken lorazepam in the past which was effective  She has tried Trazadone which caused her grogginess - Suvorexant (BELSOMRA) 5 MG TABS; Take 1 tablet by mouth at bedtime as needed.  Dispense: 30 tablet; Refill: 1  4. Memory changes  Mini cog is normal  Encouraged to continue with [place] and crossword puzzles  Continue with vitamin supplement  Will check for causes  - Vitamin  B12 - T pallidum Screening [facility] - TSH  [name], FNP    THE PATIENT IS ENCOURAGED TO PRACTICE SOCIAL DISTANCING DUE TO THE COVID-19 PANDEMIC.

## 2019-09-26 LAB — T PALLIDUM SCREENING CASCADE: T pallidum Antibodies (TP-PA): NONREACTIVE

## 2019-09-26 LAB — VITAMIN B12: Vitamin B-12: 556 pg/mL (ref 232–1245)

## 2019-09-26 LAB — TSH: TSH: 1.41 u[IU]/mL (ref 0.450–4.500)

## 2019-10-02 ENCOUNTER — Other Ambulatory Visit: Payer: Self-pay

## 2019-10-02 MED ORDER — AZELASTINE HCL 0.1 % NA SOLN: 2.0000 | Freq: Two times a day (BID) | NASAL | 1 refills | Status: DC

## 2019-10-26 ENCOUNTER — Other Ambulatory Visit: Payer: Self-pay | Admitting: Nurse Practitioner

## 2019-11-27 ENCOUNTER — Other Ambulatory Visit: Payer: Self-pay | Admitting: Nurse Practitioner

## 2019-11-28 ENCOUNTER — Ambulatory Visit (INDEPENDENT_AMBULATORY_CARE_PROVIDER_SITE_OTHER): Payer: MEDICARE | Admitting: Nurse Practitioner

## 2019-11-28 ENCOUNTER — Other Ambulatory Visit: Payer: Self-pay

## 2019-11-28 ENCOUNTER — Encounter: Payer: Self-pay | Admitting: Nurse Practitioner

## 2019-11-28 VITALS — BP 126/74 | HR 92 | Temp 98.1°F | Ht 61.4 in | Wt 153.6 lb

## 2019-11-28 DIAGNOSIS — G4700 Insomnia, unspecified: Secondary | ICD-10-CM | POA: Diagnosis not present

## 2019-11-28 DIAGNOSIS — R7309 Other abnormal glucose: Secondary | ICD-10-CM

## 2019-11-28 DIAGNOSIS — R21 Rash and other nonspecific skin eruption: Secondary | ICD-10-CM | POA: Diagnosis not present

## 2019-11-28 MED ORDER — TEMAZEPAM 15 MG PO CAPS: ORAL_CAPSULE | ORAL | 3 refills | Status: DC

## 2019-11-28 MED ORDER — MOMETASONE FUROATE 50 MCG/ACT NA SUSP
2.0000 | Freq: Every day | NASAL | 2 refills | Status: DC
Start: 2019-11-28 — End: 2020-03-06

## 2019-11-28 NOTE — Progress Notes (Signed)
This visit occurred during the SARS-CoV-2 public health emergency.  Safety protocols were in place, including screening questions prior to the visit, additional usage of staff PPE, and extensive cleaning of exam room while observing appropriate contact time as indicated for disinfecting solutions.  Subjective:     Patient ID: Deanna [name] , female    DOB: 11/02/1945 , 74 y.o.   MRN: [id number]   Chief Complaint  Patient presents with  . Prediabetes  . Rash    patient stated that the triamcinolone cream hasnt been working for the rash on her neck    HPI  She is exercising more and has a new physical therapist ([facility] in [place]).  She is 80% without pain. She has water aerobics scheduled in 2 weeks.   Rash This is a chronic problem. The current episode started more than 1 year ago. She was exposed to nothing. Past treatments include nothing. The treatment provided no relief. There is no history of allergies.  Insomnia Primary symptoms: sleep disturbance, no malaise/fatigue.  Past treatments include medication (belsomra ). PMH includes: associated symptoms present.     Past Medical History:  Diagnosis Date  . Arthritis   . Celiac disease   . Colon polyps   . GERD (gastroesophageal reflux disease)   . Hypertension   . Spinal stenosis      Family History  Problem Relation Age of Onset  . Arthritis Mother   . Cancer Mother   . Diabetes Mother   . Early death Father   . Cancer Father   . Early death Sister   . Cancer Sister   . Diabetes Sister      Current Outpatient Medications:  .  amLODipine (NORVASC) 10 MG tablet, TAKE 1 TABLET BY MOUTH EVERY DAY, Disp: , Rfl:  .  Ascorbic Acid (VITAMIN C) 1000 MG tablet, Take 1,000 mg by mouth daily., Disp: , Rfl:  .  azelastine (ASTELIN) 0.1 % nasal spray, PLACE 2 SPRAYS INTO BOTH NOSTRILS 2 TIMES DAILY AS DIRECTED, Disp: 30 mL, Rfl: 1 .  Cholecalciferol (VITAMIN D PO), Take 1,000 tablets by mouth daily. , Disp: , Rfl:  .   Cyanocobalamin (VITAMIN B 12 PO), Take by mouth., Disp: , Rfl:  .  DEXILANT 60 MG capsule, TAKE 1 CAPSULE BY MOUTH EVERY DAY, Disp: 30 capsule, Rfl: 2 .  hydrochlorothiazide (HYDRODIURIL) 25 MG tablet, Take 25 mg by mouth daily. , Disp: , Rfl:  .  hydrocortisone (ANUSOL-HC) 25 MG suppository, Place 1 suppository (25 mg total) rectally 2 (two) times daily as needed for hemorrhoids or anal itching., Disp: 12 suppository, Rfl: 3 .  LORazepam (ATIVAN) 0.5 MG tablet, Take 0.5 mg by mouth every 8 (eight) hours., Disp: , Rfl:  .  losartan (COZAAR) 50 MG tablet, Take 50 mg by mouth daily., Disp: , Rfl:  .  montelukast (SINGULAIR) 10 MG tablet, Take 10 mg by mouth at bedtime., Disp: , Rfl:  .  Multiple Vitamin (MULTI-VITAMIN) tablet, Take by mouth., Disp: , Rfl:  .  albuterol (VENTOLIN HFA) 108 (90 Base) MCG/ACT inhaler, Inhale into the lungs. (Patient not taking: Reported on 11/27/2019), Disp: , Rfl:  .  Bromelains 500 MG TABS, Take by mouth. (Patient not taking: Reported on 11/27/2019), Disp: , Rfl:  .  Capsaicin-Menthol-Methyl Sal (CAPSAICIN-METHYL SAL-MENTHOL) 0.025-1-12 % CREA, Apply 1 Tube topically 4 (four) times daily as needed. (Patient not taking: Reported on 08/14/2019), Disp: 60 g, Rfl: 2 .  Suvorexant (BELSOMRA) 5 MG TABS, Take 1  tablet by mouth at bedtime as needed. (Patient not taking: Reported on 11/27/2019), Disp: 30 tablet, Rfl: 1 .  triamcinolone cream (KENALOG) 0.1 %, Apply topically 2 (two) times daily. (Patient not taking: Reported on 11/27/2019), Disp: , Rfl:    Allergies  Allergen Reactions  . Gluten Meal Diarrhea and Other (See Comments)    Severe diarrhea per member   . Codeine   . Gabapentin   . Terbinafine Hcl Rash     Review of Systems  Constitutional: Negative.  Negative for malaise/fatigue.  Respiratory: Negative.   Cardiovascular: Negative.  Negative for chest pain and leg swelling.  Endocrine: Negative for polydipsia, polyphagia and polyuria.  Skin: Positive for rash  (posterior neck).  Neurological: Negative for dizziness and headaches.  Psychiatric/Behavioral: Positive for sleep disturbance. The patient has insomnia.      Today's Vitals   11/28/19 1136  BP: 126/74  Pulse: 92  Temp: 98.1 F (36.7 C)  TempSrc: Oral  Weight: 153 lb 9.6 oz (69.7 kg)  Height: 5' 1.4" (1.56 m)  PainSc: 0-No pain   Body mass index is 28.65 kg/m.   Objective:  Physical Exam Constitutional:      General: She is not in acute distress.    Appearance: Normal appearance. She is well-developed. She is obese.  Cardiovascular:     Rate and Rhythm: Normal rate and regular rhythm.     Pulses: Normal pulses.     Heart sounds: Normal heart sounds. No murmur heard.   Pulmonary:     Effort: Pulmonary effort is normal.     Breath sounds: Normal breath sounds.  Chest:     Chest wall: No tenderness.  Musculoskeletal:        General: Normal range of motion.  Skin:    General: Skin is warm and dry.     Capillary Refill: Capillary refill takes less than 2 seconds.     Findings: No rash (no obvious rash).  Neurological:     General: No focal deficit present.     Mental Status: She is alert and oriented to [place], place, and time.  Psychiatric:        Mood and Affect: Mood normal.        Behavior: Behavior normal.        Thought Content: Thought content normal.        Judgment: Judgment normal.         Assessment And Plan:     1. Insomnia, unspecified type  belsomra caused her nightmares  Will try her on temazepam vs lorazepam, discussed the risk for dependence and increased risk for falls with lorazepam. Also explained temazepam is a medication specifically for sleep - temazepam (RESTORIL) 15 MG capsule; Take 1 tablet by mouth at bedtime as needed may repeat x 1  Dispense: 30 capsule; Refill: 3  2. Rash and nonspecific skin eruption  No obvious rash on physical exam explains intermittent rash  Will treat with mometasone as triamcinolone has been  ineffective. - mometasone (NASONEX) 50 MCG/ACT nasal spray; Place 2 sprays into the nose daily.  Dispense: 17 g; Refill: 2  3. Abnormal glucose  No current medications  She has lost 3 lbs since her last office visit  Continue to eat a diet low in sugar and starches and stay physically active. - Hemoglobin A1c   [name], FNP    THE PATIENT IS ENCOURAGED TO PRACTICE SOCIAL DISTANCING DUE TO THE COVID-19 PANDEMIC.

## 2019-11-29 LAB — HEMOGLOBIN A1C
Est. average glucose Bld gHb Est-mCnc: 126 mg/dL
Hgb A1c MFr Bld: 6 % — ABNORMAL HIGH (ref 4.8–5.6)

## 2019-12-06 ENCOUNTER — Other Ambulatory Visit: Payer: Self-pay | Admitting: Nurse Practitioner

## 2019-12-14 ENCOUNTER — Telehealth: Payer: Self-pay

## 2019-12-14 NOTE — Telephone Encounter (Signed)
Spoke w/pt told her that her forms are ready and she can pick forms up on Monday 12/18/19 but stated she will come Monday to pick them up

## 2019-12-21 ENCOUNTER — Ambulatory Visit (INDEPENDENT_AMBULATORY_CARE_PROVIDER_SITE_OTHER): Payer: MEDICARE

## 2019-12-21 VITALS — Ht 62.5 in | Wt 153.0 lb

## 2019-12-21 DIAGNOSIS — Z0000 Encounter for general adult medical examination without abnormal findings: Secondary | ICD-10-CM | POA: Diagnosis not present

## 2019-12-21 NOTE — Progress Notes (Signed)
I connected with Deanna [name] today by telephone and verified that I am speaking with the correct Deanna [name] using two identifiers. Location patient: home Location provider: work Persons participating in the virtual visit: Najmo Angus, Glenna Durand LPN.   I discussed the limitations, risks, security and privacy concerns of performing an evaluation and management service by telephone and the availability of in [facility] appointments. I also discussed with the patient that there may be a patient responsible charge related to this service. The patient expressed understanding and verbally consented to this telephonic visit.    Interactive audio and video telecommunications were attempted between this provider and patient, however failed, due to patient having technical difficulties OR patient did not have access to video capability.  We continued and completed visit with audio only.    Vital signs may be patient reported or missing.   Subjective:   Deanna [name] is a 74 y.o. female who presents for Medicare Annual (Subsequent) preventive examination.  Review of Systems     Cardiac Risk Factors include: advanced age (>42mn, >>8women);hypertension     Objective:    Today's Vitals   12/21/19 1404  Weight: 153 lb (69.4 kg)  Height: 5' 2.5" (1.588 m)   Body mass index is 27.54 kg/m.  Advanced Directives 12/21/2019  Does Patient Have a Medical Advance Directive? No    Current Medications (verified) Outpatient Encounter Medications as of 12/21/2019  Medication Sig  . amLODipine (NORVASC) 10 MG tablet TAKE 1 TABLET BY MOUTH EVERY DAY  . Ascorbic Acid (VITAMIN C) 1000 MG tablet Take 1,000 mg by mouth daily.  .Marland Kitchenazelastine (ASTELIN) 0.1 % nasal spray PLACE 2 SPRAYS INTO BOTH NOSTRILS 2 TIMES DAILY AS DIRECTED  . Cholecalciferol (VITAMIN D PO) Take 1,000 tablets by mouth daily.   . Cyanocobalamin (VITAMIN B 12 PO) Take by mouth.  . DEXILANT 60 MG capsule TAKE 1 CAPSULE BY MOUTH EVERY DAY    . hydrochlorothiazide (HYDRODIURIL) 25 MG tablet Take 25 mg by mouth daily.   . hydrocortisone (ANUSOL-HC) 25 MG suppository Place 1 suppository (25 mg total) rectally 2 (two) times daily as needed for hemorrhoids or anal itching.  .Marland KitchenLORazepam (ATIVAN) 0.5 MG tablet Take 0.5 mg by mouth every 8 (eight) hours.  .Marland Kitchenlosartan (COZAAR) 50 MG tablet Take 50 mg by mouth daily.  . montelukast (SINGULAIR) 10 MG tablet Take 10 mg by mouth at bedtime.  . Multiple Vitamin (MULTI-VITAMIN) tablet Take by mouth.  . temazepam (RESTORIL) 15 MG capsule Take 1 tablet by mouth at bedtime as needed may repeat x 1  . albuterol (VENTOLIN HFA) 108 (90 Base) MCG/ACT inhaler Inhale into the lungs. (Patient not taking: Reported on 11/27/2019)  . Bromelains 500 MG TABS Take by mouth. (Patient not taking: Reported on 11/27/2019)  . Capsaicin-Menthol-Methyl Sal (CAPSAICIN-METHYL SAL-MENTHOL) 0.025-1-12 % CREA Apply 1 Tube topically 4 (four) times daily as needed. (Patient not taking: Reported on 08/14/2019)  . mometasone (NASONEX) 50 MCG/ACT nasal spray Place 2 sprays into the nose daily. (Patient not taking: Reported on 12/21/2019)  . Suvorexant (BELSOMRA) 5 MG TABS Take 1 tablet by mouth at bedtime as needed. (Patient not taking: Reported on 11/27/2019)   No facility-administered encounter medications on file as of 12/21/2019.    Allergies (verified) Gluten meal, Codeine, Gabapentin, and Terbinafine hcl   History: Past Medical History:  Diagnosis Date  . Arthritis   . Celiac disease   . Colon polyps   . GERD (gastroesophageal reflux disease)   .  Hypertension   . Spinal stenosis    Past Surgical History:  Procedure Laterality Date  . ABDOMINAL HYSTERECTOMY  2010  . APPENDECTOMY  1969  . CHOLECYSTECTOMY  1969  . SPINAL FUSION  01/2016  . TONSILLECTOMY AND ADENOIDECTOMY  1956   Family History  Problem Relation Age of Onset  . Arthritis Mother   . Cancer Mother   . Diabetes Mother   . Early death Father   .  Cancer Father   . Early death Sister   . Cancer Sister   . Diabetes Sister    Social History   Socioeconomic History  . Marital status: Divorced    Spouse name: Not on file  . Number of children: Not on file  . Years of education: Not on file  . Highest education level: Not on file  Occupational History  . Occupation: Retired  Tobacco Use  . Smoking status: Former Smoker    Types: Cigarettes    Quit date: 12/31/1997    Years since quitting: 21.9  . Smokeless tobacco: Never Used  Vaping Use  . Vaping Use: Never used  Substance and Sexual Activity  . Alcohol use: No  . Drug use: No  . Sexual activity: Not Currently  Other Topics Concern  . Not on file  Social History Narrative  . Not on file   Social Determinants of Health   Financial Resource Strain: Medium Risk  . Difficulty of Paying Living Expenses: Somewhat hard  Food Insecurity: No Food Insecurity  . Worried About Charity fundraiser in the Last Year: Never true  . Ran Out of Food in the Last Year: Never true  Transportation Needs: No Transportation Needs  . Lack of Transportation (Medical): No  . Lack of Transportation (Non-Medical): No  Physical Activity: Sufficiently Active  . Days of Exercise per Week: 3 days  . Minutes of Exercise per Session: 60 min  Stress: No Stress Concern Present  . Feeling of Stress : Not at all  Social Connections:   . Frequency of Communication with Friends and Family:   . Frequency of Social Gatherings with Friends and Family:   . Attends Religious Services:   . Active Member of Clubs or Organizations:   . Attends Archivist Meetings:   Marland Kitchen Marital Status:     Tobacco Counseling Counseling given: Not Answered   Clinical Intake:  Pre-visit preparation completed: Yes  Pain : No/denies pain     Nutritional Status: BMI 25 -29 Overweight Nutritional Risks: None Diabetes: No  How often do you need to have someone help you when you read instructions, pamphlets,  or other written materials from your doctor or pharmacy?: 1 - Never What is the last grade level you completed in school?: 2 yr college  Diabetic? no  Interpreter Needed?: No  Information entered by :: NAllen LPN   Activities of Daily Living In your present state of health, do you have any difficulty performing the following activities: 12/21/2019 07/05/2019  Hearing? N N  Vision? N N  Difficulty concentrating or making decisions? Y N  Comment some memory issues -  Walking or climbing stairs? N N  Dressing or bathing? N N  Doing errands, shopping? N N  Preparing Food and eating ? N -  Using the Toilet? N -  In the past six months, have you accidently leaked urine? N -  Do you have problems with loss of bowel control? N -  Managing your Medications? N -  Managing your Finances? N -  Housekeeping or managing your Housekeeping? N -  Some recent data might be hidden    Patient Care Team: [name], FNP as PCP - General (General Practice)  Indicate any recent Medical Services you may have received from other than [facility] providers in the past year (date may be approximate).     Assessment:   This is a routine wellness examination for Deanna [name].  Hearing/Vision screen  Hearing Screening   125Hz  250Hz  500Hz  1000Hz  2000Hz  3000Hz  4000Hz  6000Hz  8000Hz   Right ear:           Left ear:           Vision Screening Comments: Regular eye exams, [facility]  Dietary issues and exercise activities discussed: Current Exercise Habits: Home exercise routine, Type of exercise: strength training/weights (physical therapy), Time (Minutes): 60, Frequency (Times/Week): 3, Weekly Exercise (Minutes/Week): 180  Goals    . Patient Stated     12/21/2019, to be without pain not taking medication      Depression Screen PHQ 2/9 Scores 12/21/2019 08/14/2019 07/05/2019  PHQ - 2 Score 0 0 0    Fall Risk Fall Risk  12/21/2019 08/14/2019 07/05/2019  Falls in the past year? 0 0 0  Number falls in  past yr: - - 0  Injury with Fall? - - 0  Follow up Falls evaluation completed;Education provided;Falls prevention discussed - Falls evaluation completed    Any stairs in or around the home? No  If so, are there any without handrails? n/a Home free of loose throw rugs in walkways, pet beds, electrical cords, etc? Yes  Adequate lighting in your home to reduce risk of falls? Yes   ASSISTIVE DEVICES UTILIZED TO PREVENT FALLS:  Life alert? No  Use of a cane, walker or w/c? No  Grab bars in the bathroom? No  Shower chair or bench in shower? Yes  Elevated toilet seat or a handicapped toilet? Yes   TIMED UP AND GO:  Was the test performed? No . .  Cognitive Function:     6CIT Screen 12/21/2019  What Year? 0 points  What month? 0 points  What time? 0 points  Count back from 20 0 points  Months in reverse 0 points  Repeat phrase 0 points  Total Score 0    Immunizations Immunization History  Administered Date(s) Administered  . Influenza Split 05/20/2006  . Influenza, High Dose Seasonal PF 05/20/2006  . PFIZER SARS-COV-2 Vaccination 08/15/2019, 09/05/2019  . Pneumococcal Conjugate-13 05/31/2013, 05/31/2013  . Pneumococcal Polysaccharide-23 05/08/2016  . Td 03/10/2004  . Tdap 03/10/2004, 04/13/2014, 04/13/2014    TDAP status: Up to date Flu Vaccine status: Declined, Education has been provided regarding the importance of this vaccine but patient still declined. Advised may receive this vaccine at local pharmacy or Health Dept. Aware to provide a copy of the vaccination record if obtained from local pharmacy or Health Dept. Verbalized acceptance and understanding. Pneumococcal vaccine status: Up to date Covid-19 vaccine status: Completed vaccines  Qualifies for Shingles Vaccine? Yes   Zostavax completed No   Shingrix Completed?: No.    Education has been provided regarding the importance of this vaccine. Patient has been advised to call insurance company to determine out of  pocket expense if they have not yet received this vaccine. Advised may also receive vaccine at local pharmacy or Health Dept. Verbalized acceptance and understanding.  Screening Tests Health Maintenance  Topic Date Due  . Hepatitis C Screening  Never done  .  MAMMOGRAM  Never done  . COLONOSCOPY  Never done  . DEXA SCAN  Never done  . INFLUENZA VACCINE  12/31/2019  . TETANUS/TDAP  04/13/2024  . COVID-19 Vaccine  Completed  . PNA vac Low Risk Adult  Completed    Health Maintenance  Health Maintenance Due  Topic Date Due  . Hepatitis C Screening  Never done  . MAMMOGRAM  Never done  . COLONOSCOPY  Never done  . DEXA SCAN  Never done    Colorectal cancer screening: Completed 2019 . Repeat every 5 years Mammogram status: Completed 05/2019. Repeat every year Bone Density status: Completed 2020.  Lung Cancer Screening: (Low Dose CT Chest recommended if Age 92-80 years, 30 pack-year currently smoking OR have quit w/in 15years.) does not qualify.   Lung Cancer Screening Referral: no   Additional Screening:  Hepatitis C Screening: does qualify;   Vision Screening: Recommended annual ophthalmology exams for early detection of glaucoma and other disorders of the eye. Is the patient up to date with their annual eye exam?  Yes  Who is the provider or what is the name of the office in which the patient attends annual eye exams? [facility] If pt is not established with a provider, would they like to be referred to a provider to establish care? No .   Dental Screening: Recommended annual dental exams for proper oral hygiene  Community Resource Referral / Chronic Care Management: CRR required this visit?  Yes   CCM required this visit?  No      Plan:     I have personally reviewed and noted the following in the patient's chart:   . Medical and social history . Use of alcohol, tobacco or illicit drugs  . Current medications and supplements . Functional ability and  status . Nutritional status . Physical activity . Advanced directives . List of other physicians . Hospitalizations, surgeries, and ER visits in previous 12 months . Vitals . Screenings to include cognitive, depression, and falls . Referrals and appointments  In addition, I have reviewed and discussed with patient certain preventive protocols, quality metrics, and best practice recommendations. A written personalized care plan for preventive services as well as general preventive health recommendations were provided to patient.   Due to this being a telephonic visit, the after visit summary with patients personalized plan was offered to patient via mail or my-chart. Patient preferred to pick up at office at next visit.   Kellie Simmering, LPN   06/17/3565   Nurse Notes:

## 2019-12-21 NOTE — Patient Instructions (Signed)
Deanna [name] , Thank you for taking time to come for your Medicare Wellness Visit. I appreciate your ongoing commitment to your health goals. Please review the following plan we discussed and let me know if I can assist you in the future.   Screening recommendations/referrals: Colonoscopy: states had in 2019 Mammogram: states had 05/2019 Bone Density: states had 2020 Recommended yearly ophthalmology/optometry visit for glaucoma screening and checkup Recommended yearly dental visit for hygiene and checkup  Vaccinations: Influenza vaccine: decline Pneumococcal vaccine: completed 05/08/2016 Tdap vaccine: completed 04/13/2014, due 04/13/2024 Shingles vaccine: discussed   Covid-19:09/05/2019, 08/15/2019  Advanced directives: Advance directive discussed with you today.    Conditions/risks identified: none  Next appointment: 03/05/2020 at 11:15 Follow up in one year for your annual wellness visit    Preventive Care 65 Years and Older, Female Preventive care refers to lifestyle choices and visits with your health care provider that can promote health and wellness. What does preventive care include?  A yearly physical exam. This is also called an annual well check.  Dental exams once or twice a year.  Routine eye exams. Ask your health care provider how often you should have your eyes checked.  Personal lifestyle choices, including:  Daily care of your teeth and gums.  Regular physical activity.  Eating a healthy diet.  Avoiding tobacco and drug use.  Limiting alcohol use.  Practicing safe sex.  Taking low-dose aspirin every day.  Taking vitamin and mineral supplements as recommended by your health care provider. What happens during an annual well check? The services and screenings done by your health care provider during your annual well check will depend on your age, overall health, lifestyle risk factors, and family history of disease. Counseling  Your health care provider  may ask you questions about your:  Alcohol use.  Tobacco use.  Drug use.  Emotional well-being.  Home and relationship well-being.  Sexual activity.  Eating habits.  History of falls.  Memory and ability to understand (cognition).  Work and work Statistician.  Reproductive health. Screening  You may have the following tests or measurements:  Height, weight, and BMI.  Blood pressure.  Lipid and cholesterol levels. These may be checked every 5 years, or more frequently if you are over 48 years old.  Skin check.  Lung cancer screening. You may have this screening every year starting at age 38 if you have a 30-pack-year history of smoking and currently smoke or have quit within the past 15 years.  Fecal occult blood test (FOBT) of the stool. You may have this test every year starting at age 71.  Flexible sigmoidoscopy or colonoscopy. You may have a sigmoidoscopy every 5 years or a colonoscopy every 10 years starting at age 29.  Hepatitis C blood test.  Hepatitis B blood test.  Sexually transmitted disease (STD) testing.  Diabetes screening. This is done by checking your blood sugar (glucose) after you have not eaten for a while (fasting). You may have this done every 1-3 years.  Bone density scan. This is done to screen for osteoporosis. You may have this done starting at age 40.  Mammogram. This may be done every 1-2 years. Talk to your health care provider about how often you should have regular mammograms. Talk with your health care provider about your test results, treatment options, and if necessary, the need for more tests. Vaccines  Your health care provider may recommend certain vaccines, such as:  Influenza vaccine. This is recommended every year.  Tetanus,  diphtheria, and acellular pertussis (Tdap, Td) vaccine. You may need a Td booster every 10 years.  Zoster vaccine. You may need this after age 28.  Pneumococcal 13-valent conjugate (PCV13) vaccine.  One dose is recommended after age 43.  Pneumococcal polysaccharide (PPSV23) vaccine. One dose is recommended after age 21. Talk to your health care provider about which screenings and vaccines you need and how often you need them. This information is not intended to replace advice given to you by your health care provider. Make sure you discuss any questions you have with your health care provider. Document Released: 06/14/2015 Document Revised: 02/05/2016 Document Reviewed: 03/19/2015 Elsevier Interactive Patient Education  2017 [employer] Prevention in the Home Falls can cause injuries. They can happen to people of all ages. There are many things you can do to make your home safe and to help prevent falls. What can I do on the outside of my home?  Regularly fix the edges of walkways and driveways and fix any cracks.  Remove anything that might make you trip as you walk through a door, such as a raised step or threshold.  Trim any bushes or trees on the path to your home.  Use bright outdoor lighting.  Clear any walking paths of anything that might make someone trip, such as rocks or tools.  Regularly check to see if handrails are loose or broken. Make sure that both sides of any steps have handrails.  Any raised decks and porches should have guardrails on the edges.  Have any leaves, snow, or ice cleared regularly.  Use sand or salt on walking paths during winter.  Clean up any spills in your garage right away. This includes oil or grease spills. What can I do in the bathroom?  Use night lights.  Install grab bars by the toilet and in the tub and shower. Do not use towel bars as grab bars.  Use non-skid mats or decals in the tub or shower.  If you need to sit down in the shower, use a plastic, non-slip stool.  Keep the floor dry. Clean up any water that spills on the floor as soon as it happens.  Remove soap buildup in the tub or shower regularly.  Attach bath  mats securely with double-sided non-slip rug tape.  Do not have throw rugs and other things on the floor that can make you trip. What can I do in the bedroom?  Use night lights.  Make sure that you have a light by your bed that is easy to reach.  Do not use any sheets or blankets that are too big for your bed. They should not hang down onto the floor.  Have a firm chair that has side arms. You can use this for support while you get dressed.  Do not have throw rugs and other things on the floor that can make you trip. What can I do in the kitchen?  Clean up any spills right away.  Avoid walking on wet floors.  Keep items that you use a lot in easy-to-reach places.  If you need to reach something above you, use a strong step stool that has a grab bar.  Keep electrical cords out of the way.  Do not use floor polish or wax that makes floors slippery. If you must use wax, use non-skid floor wax.  Do not have throw rugs and other things on the floor that can make you trip. What can I do with  my stairs?  Do not leave any items on the stairs.  Make sure that there are handrails on both sides of the stairs and use them. Fix handrails that are broken or loose. Make sure that handrails are as long as the stairways.  Check any carpeting to make sure that it is firmly attached to the stairs. Fix any carpet that is loose or worn.  Avoid having throw rugs at the top or bottom of the stairs. If you do have throw rugs, attach them to the floor with carpet tape.  Make sure that you have a light switch at the top of the stairs and the bottom of the stairs. If you do not have them, ask someone to add them for you. What else can I do to help prevent falls?  Wear shoes that:  Do not have high heels.  Have rubber bottoms.  Are comfortable and fit you well.  Are closed at the toe. Do not wear sandals.  If you use a stepladder:  Make sure that it is fully opened. Do not climb a closed  stepladder.  Make sure that both sides of the stepladder are locked into place.  Ask someone to hold it for you, if possible.  Clearly mark and make sure that you can see:  Any grab bars or handrails.  First and last steps.  Where the edge of each step is.  Use tools that help you move around (mobility aids) if they are needed. These include:  Canes.  Walkers.  Scooters.  Crutches.  Turn on the lights when you go into a dark area. Replace any light bulbs as soon as they burn out.  Set up your furniture so you have a clear path. Avoid moving your furniture around.  If any of your floors are uneven, fix them.  If there are any pets around you, be aware of where they are.  Review your medicines with your doctor. Some medicines can make you feel dizzy. This can increase your chance of falling. Ask your doctor what other things that you can do to help prevent falls. This information is not intended to replace advice given to you by your health care provider. Make sure you discuss any questions you have with your health care provider. Document Released: 03/14/2009 Document Revised: 10/24/2015 Document Reviewed: 06/22/2014 Elsevier Interactive Patient Education  2017 [employer].

## 2019-12-28 ENCOUNTER — Telehealth: Payer: Self-pay

## 2019-12-28 NOTE — Telephone Encounter (Signed)
Spoke w/pt her forms are up front and [facility] faxed them again. Pt stated she will pick forms up as well

## 2020-01-25 ENCOUNTER — Telehealth: Payer: Self-pay | Admitting: Nurse Practitioner

## 2020-01-29 ENCOUNTER — Telehealth: Payer: Self-pay | Admitting: Nurse Practitioner

## 2020-03-05 ENCOUNTER — Ambulatory Visit: Payer: MEDICARE | Admitting: Nurse Practitioner

## 2020-03-06 ENCOUNTER — Ambulatory Visit (INDEPENDENT_AMBULATORY_CARE_PROVIDER_SITE_OTHER): Payer: MEDICARE | Admitting: Nurse Practitioner

## 2020-03-06 ENCOUNTER — Encounter: Payer: Self-pay | Admitting: Nurse Practitioner

## 2020-03-06 ENCOUNTER — Other Ambulatory Visit: Payer: Self-pay

## 2020-03-06 VITALS — BP 130/76 | HR 80 | Temp 98.5°F | Ht 61.4 in | Wt 151.6 lb

## 2020-03-06 DIAGNOSIS — R059 Cough, unspecified: Secondary | ICD-10-CM

## 2020-03-06 DIAGNOSIS — R7309 Other abnormal glucose: Secondary | ICD-10-CM | POA: Insufficient documentation

## 2020-03-06 DIAGNOSIS — H6690 Otitis media, unspecified, unspecified ear: Secondary | ICD-10-CM | POA: Diagnosis not present

## 2020-03-06 DIAGNOSIS — I10 Essential (primary) hypertension: Secondary | ICD-10-CM

## 2020-03-06 DIAGNOSIS — G4700 Insomnia, unspecified: Secondary | ICD-10-CM | POA: Diagnosis not present

## 2020-03-06 DIAGNOSIS — R0982 Postnasal drip: Secondary | ICD-10-CM

## 2020-03-06 HISTORY — DX: Other abnormal glucose: R73.09

## 2020-03-06 MED ORDER — LORAZEPAM 0.5 MG PO TABS: 0.5000 mg | ORAL_TABLET | Freq: Three times a day (TID) | ORAL | 0 refills | Status: DC

## 2020-03-06 MED ORDER — AMOXICILLIN 875 MG PO TABS: 875.0000 mg | ORAL_TABLET | Freq: Two times a day (BID) | ORAL | 0 refills | Status: DC

## 2020-03-06 NOTE — Progress Notes (Signed)
I,Yamilka Roman [employer] as a Education administrator for [employer], FNP.,have documented all relevant documentation on the behalf of [name], FNP,as directed by  [name], FNP while in the presence of [name], [place]. This visit occurred during the SARS-CoV-2 public health emergency.  Safety protocols were in place, including screening questions prior to the visit, additional usage of staff PPE, and extensive cleaning of exam room while observing appropriate contact time as indicated for disinfecting solutions.  Subjective:     Patient ID: Deanna [name] , female    DOB: [birth year] , 74 y.o.   MRN: [id number]   Chief Complaint  Patient presents with  . ear pain    patient stated she has been having pain in her right ear. she stated the aching comes and goes.     HPI  She does feel like she is increasing the volume on the tv at times.   Otalgia  There is pain in the right ear. This is a new problem. The current episode started yesterday. The problem occurs every few hours (feels like is occurring more frequently). There has been no fever. The pain is at a severity of 5/10. The pain is moderate. Associated symptoms include coughing. Pertinent negatives include no abdominal pain, headaches or sore throat. Associated symptoms comments: Post nasal drainage 3-4 days ago noticed.  . She has tried NSAIDs for the symptoms.     Past Medical History:  Diagnosis Date  . Arthritis   . Celiac disease   . Colon polyps   . GERD (gastroesophageal reflux disease)   . Hypertension   . Spinal stenosis      Family History  Problem Relation Age of Onset  . Arthritis Mother   . Cancer Mother   . Diabetes Mother   . Early death Father   . Cancer Father   . Early death Sister   . Cancer Sister   . Diabetes Sister      Current Outpatient Medications:  .  amLODipine (NORVASC) 10 MG tablet, TAKE 1 TABLET BY MOUTH EVERY DAY, Disp: , Rfl:  .  Ascorbic Acid (VITAMIN C) 1000 MG tablet, Take 1,000 mg  by mouth daily., Disp: , Rfl:  .  azelastine (ASTELIN) 0.1 % nasal spray, PLACE 2 SPRAYS INTO BOTH NOSTRILS 2 TIMES DAILY AS DIRECTED, Disp: 30 mL, Rfl: 1 .  Cholecalciferol (VITAMIN D PO), Take 1,000 tablets by mouth daily. , Disp: , Rfl:  .  Cyanocobalamin (VITAMIN B 12 PO), Take by mouth., Disp: , Rfl:  .  DEXILANT 60 MG capsule, TAKE 1 CAPSULE BY MOUTH EVERY DAY, Disp: 30 capsule, Rfl: 2 .  hydrochlorothiazide (HYDRODIURIL) 25 MG tablet, Take 25 mg by mouth daily. , Disp: , Rfl:  .  hydrocortisone (ANUSOL-HC) 25 MG suppository, Place 1 suppository (25 mg total) rectally 2 (two) times daily as needed for hemorrhoids or anal itching., Disp: 12 suppository, Rfl: 3 .  losartan (COZAAR) 50 MG tablet, Take 50 mg by mouth daily., Disp: , Rfl:  .  montelukast (SINGULAIR) 10 MG tablet, Take 10 mg by mouth at bedtime., Disp: , Rfl:  .  Multiple Vitamin (MULTI-VITAMIN) tablet, Take by mouth., Disp: , Rfl:  .  amoxicillin (AMOXIL) 875 MG tablet, Take 1 tablet (875 mg total) by mouth 2 (two) times daily., Disp: 14 tablet, Rfl: 0 .  LORazepam (ATIVAN) 0.5 MG tablet, Take 1 tablet (0.5 mg total) by mouth every 8 (eight) hours., Disp: 20 tablet, Rfl: 0   Allergies  Allergen  Reactions  . Gluten Meal Diarrhea and Other (See Comments)    Severe diarrhea per member   . Codeine   . Gabapentin   . Terbinafine Hcl Rash     Review of Systems  Constitutional: Negative.  Negative for fatigue.  HENT: Positive for ear pain. Negative for sore throat.   Respiratory: Positive for cough.   Cardiovascular: Negative.   Gastrointestinal: Negative.  Negative for abdominal pain.  Genitourinary: Negative.   Musculoskeletal: Negative.   Skin: Negative.   Neurological: Negative for headaches.  Psychiatric/Behavioral: Negative.      Today's Vitals   03/06/20 1545  BP: 130/76  Pulse: 80  Temp: 98.5 F (36.9 C)  TempSrc: Oral  Weight: 151 lb 9.6 oz (68.8 kg)  Height: 5' 1.4" (1.56 m)  PainSc: 4   PainLoc: Ear    Body mass index is 28.27 kg/m.   Objective:  Physical Exam Constitutional:      General: She is not in acute distress.    Appearance: Normal appearance.  Cardiovascular:     Rate and Rhythm: Normal rate and regular rhythm.  Pulmonary:     Effort: Pulmonary effort is normal. No respiratory distress.     Breath sounds: Normal breath sounds. No wheezing.  Neurological:     General: No focal deficit present.     Mental Status: She is alert and oriented to [place], place, and time.     Cranial Nerves: No cranial nerve deficit.  Psychiatric:        Mood and Affect: Mood normal.        Behavior: Behavior normal.        Thought Content: Thought content normal.        Judgment: Judgment normal.         Assessment And Plan:     1. Abnormal glucose  Chronic, stable  Will check HgbA1c - Hemoglobin A1c  2. Insomnia, unspecified type  I will send for a sleep study to evaluate for any sleep disorders  She is also given lorazepam limited supply as the belsomra was ineffective - Ambulatory referral to Sleep Studies - LORazepam (ATIVAN) 0.5 MG tablet; Take 1 tablet (0.5 mg total) by mouth every 8 (eight) hours.  Dispense: 20 tablet; Refill: 0  3. Essential hypertension  Chronic, fair control  Will check kidney functions - BMP8+eGFR  4. Ear infection  Right ear with erythema and pale TM  Will treat with amoxicillin  5. Cough  Will check covid 19  She is encouraged to remain isolated until has results of lab - Novel Coronavirus, NAA ([facility])  6. Postnasal drip  Will check covid 19  She is encouraged to remain isolated until has results of lab - Novel Coronavirus, NAA ([facility])    Patient was given opportunity to ask questions. Patient verbalized understanding of the plan and was able to repeat key elements of the plan. All questions were answered to their satisfaction.   Teola Bradley, FNP, have reviewed all documentation for this visit. The documentation on  03/18/20 for the exam, diagnosis, procedures, and orders are all accurate and complete.   THE PATIENT IS ENCOURAGED TO PRACTICE SOCIAL DISTANCING DUE TO THE COVID-19 PANDEMIC.

## 2020-03-07 LAB — BMP8+EGFR
BUN/Creatinine Ratio: 19 (ref 12–28)
BUN: 12 mg/dL (ref 8–27)
CO2: 25 mmol/L (ref 20–29)
Calcium: 10.1 mg/dL (ref 8.7–10.3)
Chloride: 103 mmol/L (ref 96–106)
Creatinine, Ser: 0.63 mg/dL (ref 0.57–1.00)
GFR calc Af Amer: 104 mL/min/{1.73_m2} (ref >59)
GFR calc non Af Amer: 90 mL/min/{1.73_m2} (ref >59)
Glucose: 104 mg/dL — ABNORMAL HIGH (ref 65–99)
Potassium: 3.7 mmol/L (ref 3.5–5.2)
Sodium: 142 mmol/L (ref 134–144)

## 2020-03-07 LAB — HEMOGLOBIN A1C
Est. average glucose Bld gHb Est-mCnc: 120 mg/dL
Hgb A1c MFr Bld: 5.8 % — ABNORMAL HIGH (ref 4.8–5.6)

## 2020-03-07 LAB — NOVEL CORONAVIRUS, NAA: SARS-CoV-2, NAA: NOT DETECTED

## 2020-03-07 LAB — SARS-COV-2, NAA 2 DAY TAT

## 2020-03-11 ENCOUNTER — Ambulatory Visit: Payer: MEDICARE | Admitting: Nurse Practitioner

## 2020-03-24 ENCOUNTER — Other Ambulatory Visit: Payer: Self-pay | Admitting: Nurse Practitioner

## 2020-04-29 ENCOUNTER — Ambulatory Visit (INDEPENDENT_AMBULATORY_CARE_PROVIDER_SITE_OTHER): Payer: MEDICARE | Admitting: Neurology

## 2020-04-29 ENCOUNTER — Encounter: Payer: Self-pay | Admitting: Neurology

## 2020-04-29 VITALS — BP 106/63 | HR 84 | Ht 62.5 in | Wt 149.5 lb

## 2020-04-29 DIAGNOSIS — F518 Other sleep disorders not due to a substance or known physiological condition: Secondary | ICD-10-CM

## 2020-04-29 DIAGNOSIS — R0683 Snoring: Secondary | ICD-10-CM | POA: Insufficient documentation

## 2020-04-29 DIAGNOSIS — G4709 Other insomnia: Secondary | ICD-10-CM | POA: Diagnosis not present

## 2020-04-29 HISTORY — DX: Snoring: R06.83

## 2020-04-29 HISTORY — DX: Other sleep disorders not due to a substance or known physiological condition: F51.8

## 2020-04-29 NOTE — Progress Notes (Signed)
SLEEP MEDICINE CLINIC    Provider:  Larey Seat, MD  Primary Care Physician:  [name], [place] [address]     Referring Provider: [name], [place],  [place] [address]          Chief Complaint according to patient   Patient presents with:    . New Patient (Initial Visit)     Report insomnia 2-3 nights each week. She also tends to wake up frequently. Still doing paperwork.       HISTORY OF PRESENT ILLNESS:  Deanna [name] is a 74 y.o.female patient and seen here upon a referral on 04/29/2020 from Darleen Crocker, NP.   Chief concern according to patient :    I have the pleasure of seeing Deanna [name] today, a right -handed  female with a possible sleep disorder.   She  has a past medical history of Arthritis, Celiac disease, Colon polyps, GERD (gastroesophageal reflux disease), Hypertension, Insomnia, and Spinal stenosis.  She had never had a sleep study. No RLS, No Apnea reported.   Sleep relevant medical history: Nocturia 3 times nightly, Tonsillectomy at age 37. short sleeper all her life.   Family medical /sleep history: No other family member on CPAP with OSA, insomnia, sleep walkers.    Social history:  Patient is  retired from [employer]  and lives in a household alone, no pets.  She has 2 adult daughters. Tobacco use- 22 years ago .   ETOH use seldomly, Caffeine intake in form of Coffee(1 cup a day) Soda( /) Tea ( /) or energy drinks. Regular exercise- not now     Sleep habits are as follows: The patient's dinner time is between 7 PM.  The patient goes to bed at 11 PM and struggles to fall asleep- stay asleep-she usually continues to sleep for 2-3 hours, wakes for an hour break, the first time at 3-4 AM.  She gets a total of 6-7 hours on a good night. A couple of times each week she wil take Benadryl. The preferred sleep position is supine and on either side, with the support of 1  pillow.  Dreams are reportedly infrequent.   7 AM is the usual rise time. The patient wakes up spontaneously.  She reports not feeling refreshed or restored in AM, with symptoms such as dry eye but not residual fatigue.  Naps are not taken.   Review of Systems: Out of a complete 14 system review, the patient complains of only the following symptoms, and all other reviewed systems are negative.:  Fatigue, sleepiness , snoring, fragmented sleep, Insomnia - 2-3 days each week. Combats this with benadryl and deep breathing exercises.    How likely are you to doze in the following situations: 0 = not likely, 1 = slight chance, 2 = moderate chance, 3 = high chance   Sitting and Reading? Watching Television? Sitting inactive in a public place (theater or meeting)? As a passenger in a car for an hour without a break? Lying down in the afternoon when circumstances permit? Sitting and talking to someone? Sitting quietly after lunch without alcohol? In a car, while stopped for a few minutes in traffic?   Total = 7/ 24 points   FSS endorsed at 12/ 63 points.  DS 0/ 15   Social History   Socioeconomic History  . Marital status: Divorced    Spouse name: Not on file  . Number  of children: 2  . Years of education: 2 years college  . Highest education level: Not on file  Occupational History  . Occupation: Retired  Tobacco Use  . Smoking status: Former Smoker    Types: Cigarettes    Quit date: 12/31/1997    Years since quitting: 22.3  . Smokeless tobacco: Never Used  Vaping Use  . Vaping Use: Never used  Substance and Sexual Activity  . Alcohol use: Yes    Comment: social only  . Drug use: No  . Sexual activity: Not Currently  Other Topics Concern  . Not on file  Social History Narrative   Lives alone.   Right-handed.   One cup caffeine per day.   Social Determinants of Health   Financial Resource Strain: Medium Risk  . Difficulty of Paying Living Expenses: Somewhat hard    Food Insecurity: No Food Insecurity  . Worried About Charity fundraiser in the Last Year: Never true  . Ran Out of Food in the Last Year: Never true  Transportation Needs: No Transportation Needs  . Lack of Transportation (Medical): No  . Lack of Transportation (Non-Medical): No  Physical Activity: Sufficiently Active  . Days of Exercise per Week: 3 days  . Minutes of Exercise per Session: 60 min  Stress: No Stress Concern Present  . Feeling of Stress : Not at all  Social Connections:   . Frequency of Communication with Friends and Family: Not on file  . Frequency of Social Gatherings with Friends and Family: Not on file  . Attends Religious Services: Not on file  . Active Member of Clubs or Organizations: Not on file  . Attends Archivist Meetings: Not on file  . Marital Status: Not on file    Family History  Problem Relation Age of Onset  . Arthritis Mother   . Diabetes Mother   . Pancreatic cancer Mother   . Early death Father   . Leukemia Father   . Early death Sister   . Diabetes Sister     Past Medical History:  Diagnosis Date  . Arthritis   . Celiac disease   . Colon polyps   . GERD (gastroesophageal reflux disease)   . Hypertension   . Insomnia   . Spinal stenosis     Past Surgical History:  Procedure Laterality Date  . ABDOMINAL HYSTERECTOMY  2010  . APPENDECTOMY  1969  . CHOLECYSTECTOMY  1969  . SPINAL FUSION  01/2016  . TONSILLECTOMY AND ADENOIDECTOMY  1956     Current Outpatient Medications on File Prior to Visit  Medication Sig Dispense Refill  . amLODipine (NORVASC) 10 MG tablet TAKE 1 TABLET BY MOUTH EVERY DAY    . Ascorbic Acid (VITAMIN C) 1000 MG tablet Take 1,000 mg by mouth daily.    Marland Kitchen azelastine (ASTELIN) 0.1 % nasal spray PLACE 2 SPRAYS INTO BOTH NOSTRILS 2 TIMES DAILY AS DIRECTED 30 mL 1  . b complex vitamins capsule Take 1 capsule by mouth daily.    . Calcium-Magnesium-Vitamin D (CALCIUM MAGNESIUM PO) Take 1 tablet by mouth  daily.    . Cholecalciferol (VITAMIN D PO) Take 1,000 tablets by mouth daily.     Marland Kitchen DEXILANT 60 MG capsule TAKE 1 CAPSULE BY MOUTH EVERY DAY 30 capsule 2  . hydrochlorothiazide (HYDRODIURIL) 25 MG tablet Take 25 mg by mouth daily.     . hydrocortisone (ANUSOL-HC) 25 MG suppository Place 1 suppository (25 mg total) rectally 2 (two) times daily as  needed for hemorrhoids or anal itching. 12 suppository 3  . LORazepam (ATIVAN) 0.5 MG tablet Take 1 tablet (0.5 mg total) by mouth every 8 (eight) hours. 20 tablet 0  . losartan (COZAAR) 50 MG tablet Take 50 mg by mouth daily.    . montelukast (SINGULAIR) 10 MG tablet Take 10 mg by mouth at bedtime.    . Multiple Vitamin (MULTI-VITAMIN) tablet Take by mouth.     No current facility-administered medications on file prior to visit.    Allergies  Allergen Reactions  . Gluten Meal Diarrhea and Other (See Comments)    Severe diarrhea per member   . Codeine   . Dust Mite Extract   . Gabapentin   . Terbinafine Hcl Rash    Physical exam:  Today's Vitals   04/29/20 0859  BP: 106/63  Pulse: 84  Weight: 149 lb 8 oz (67.8 kg)  Height: 5' 2.5" (1.588 m)   Body mass index is 26.91 kg/m.   Wt Readings from Last 3 Encounters:  04/29/20 149 lb 8 oz (67.8 kg)  03/06/20 151 lb 9.6 oz (68.8 kg)  12/21/19 153 lb (69.4 kg)     Ht Readings from Last 3 Encounters:  04/29/20 5' 2.5" (1.588 m)  03/06/20 5' 1.4" (1.56 m)  12/21/19 5' 2.5" (1.588 m)      General: The patient is awake, alert and appears not in acute distress. The patient is well groomed. Head: Normocephalic, atraumatic. Neck is supple. Mallampati 2,  neck circumference:15 inches.  Nasal airflow  patent.  Retrognathia is  seen.  Dental status: intact  Cardiovascular:  Regular rate and cardiac rhythm by pulse,  without distended neck veins. Respiratory: Lungs are clear to auscultation.  Skin:  Without evidence of ankle edema, or rash. Trunk: The patient's posture is erect.     Neurologic exam : The patient is awake and alert, oriented to place and time.   Memory subjective described as intact.  Attention span & concentration ability appears normal.  Speech is fluent,  without  dysarthria, dysphonia or aphasia.  Mood and affect are appropriate.   Cranial nerves: no loss of smell or taste reported  Pupils are equal and briskly reactive to light. Funduscopic exam deferred. .  Extraocular movements in vertical and horizontal planes were intact and without nystagmus. No Diplopia. Visual fields by finger perimetry are intact. Hearing was intact to soft voice and finger rubbing.   Facial sensation intact to fine touch. Facial motor strength is symmetric and tongue and uvula move midline.  Neck ROM : rotation, tilt and flexion extension were normal for age and shoulder shrug was symmetrical.    Motor exam:  Symmetric bulk, tone and ROM.   Normal tone without cog- wheeling, symmetric grip strength .   Sensory:  Fine touch, pinprick and vibration were tested  and  normal.  Proprioception tested in the upper extremities was normal.   Coordination: Rapid alternating movements in the fingers/hands were of normal speed.  The Finger-to-nose maneuver was intact without evidence of ataxia, dysmetria or tremor.   Gait and station: Patient could rise unassisted from a seated position, walked without assistive device.  Stance is of normal width/ base . Deep tendon reflexes: in the  upper and lower extremities are symmetric and intact.  Babinski response was deferred .      After spending a total time of 38mnutes face to face and additional time for physical and neurologic examination, review of laboratory studies,  personal review of  imaging studies, reports and results of other testing and review of referral information / records as far as provided in visit, I have established the following assessments:  1) Short sleeper all her life 2) Some insomnia since perimenopause.   3)  No depression, pain or discomfort reported. No RLS or GERD.    My Plan is to proceed with:  1) screening test to rule out OSA,  She is snoring. She has woken up from snoring.  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  3) Failed Melatonin 5 mg or less at night- she reports feeling groggy. .   I would like to thank [name], [place] and [name], [place],  [place] [address] for allowing me to meet with and to take care of this pleasant patient.   In short, Deanna [name] is presenting with insomnia , a symptom that can be attributed to snoring ,  I plan to follow up either personally or through our NP within 4 month.   CC: I will share my notes with PCP.   Electronically signed by: Larey Seat, MD 04/29/2020 9:16 AM  [facility] Board certified by The [employer] of Sleep Medicine and Diplomate of the [employer] of Sleep Medicine. Board certified In Neurology through the [place], Fellow of the [employer] of Neurology. Medical Director of [facility].

## 2020-04-29 NOTE — Patient Instructions (Signed)
Please remember to try to maintain good sleep hygiene, which means: Keep a regular sleep and wake schedule, try not to exercise or have a meal within 2 hours of your bedtime, try to keep your bedroom conducive for sleep, that is, cool and dark, without light distractors such as an illuminated alarm clock, and refrain from watching TV right before sleep or in the middle of the night and do not keep the TV or radio on during the night. Also, try not to use or play on electronic devices at bedtime, such as your cell phone, tablet PC or laptop. If you like to read at bedtime on an electronic device, try to dim the background light as much as possible. Do not eat in the middle of the night.   We will request a sleep study.    We will look for leg twitching and snoring or sleep apnea.   For chronic insomnia, you are best followed by a psychiatrist and/or sleep psychologist.   We will call you with the sleep study results and make a follow up appointment if needed.     Insomnia Insomnia is a sleep disorder that makes it difficult to fall asleep or stay asleep. Insomnia can cause fatigue, low energy, difficulty concentrating, mood swings, and poor performance at work or school. There are three different ways to classify insomnia:  Difficulty falling asleep.  Difficulty staying asleep.  Waking up too early in the morning. Any type of insomnia can be long-term (chronic) or short-term (acute). Both are common. Short-term insomnia usually lasts for three months or less. Chronic insomnia occurs at least three times a week for longer than three months. What are the causes? Insomnia may be caused by another condition, situation, or substance, such as:  Anxiety.  Certain medicines.  Gastroesophageal reflux disease (GERD) or other gastrointestinal conditions.  Asthma or other breathing conditions.  Restless legs syndrome, sleep apnea, or other sleep disorders.  Chronic  pain.  Menopause.  Stroke.  Abuse of alcohol, tobacco, or illegal drugs.  Mental health conditions, such as depression.  Caffeine.  Neurological disorders, such as Alzheimer's disease.  An overactive thyroid (hyperthyroidism). Sometimes, the cause of insomnia may not be known. What increases the risk? Risk factors for insomnia include:  Gender. Women are affected more often than men.  Age. Insomnia is more common as you get older.  Stress.  Lack of exercise.  Irregular work schedule or working night shifts.  Traveling between different time zones.  Certain medical and mental health conditions. What are the signs or symptoms? If you have insomnia, the main symptom is having trouble falling asleep or having trouble staying asleep. This may lead to other symptoms, such as:  Feeling fatigued or having low energy.  Feeling nervous about going to sleep.  Not feeling rested in the morning.  Having trouble concentrating.  Feeling irritable, anxious, or depressed. How is this diagnosed? This condition may be diagnosed based on:  Your symptoms and medical history. Your health care provider may ask about: ? Your sleep habits. ? Any medical conditions you have. ? Your mental health.  A physical exam. How is this treated? Treatment for insomnia depends on the cause. Treatment may focus on treating an underlying condition that is causing insomnia. Treatment may also include:  Medicines to help you sleep.  Counseling or therapy.  Lifestyle adjustments to help you sleep better. Follow these instructions at home: Eating and drinking   Limit or avoid alcohol, caffeinated beverages, and cigarettes,  especially close to bedtime. These can disrupt your sleep.  Do not eat a large meal or eat spicy foods right before bedtime. This can lead to digestive discomfort that can make it hard for you to sleep. Sleep habits   Keep a sleep diary to help you and your health care  provider figure out what could be causing your insomnia. Write down: ? When you sleep. ? When you wake up during the night. ? How well you sleep. ? How rested you feel the next day. ? Any side effects of medicines you are taking. ? What you eat and drink.  Make your bedroom a dark, comfortable place where it is easy to fall asleep. ? Put up shades or blackout curtains to block light from outside. ? Use a white noise machine to block noise. ? Keep the temperature cool.  Limit screen use before bedtime. This includes: ? Watching TV. ? Using your smartphone, tablet, or computer.  Stick to a routine that includes going to bed and waking up at the same times every day and night. This can help you fall asleep faster. Consider making a quiet activity, such as reading, part of your nighttime routine.  Try to avoid taking naps during the day so that you sleep better at night.  Get out of bed if you are still awake after 15 minutes of trying to sleep. Keep the lights down, but try reading or doing a quiet activity. When you feel sleepy, go back to bed. General instructions  Take over-the-counter and prescription medicines only as told by your health care provider.  Exercise regularly, as told by your health care provider. Avoid exercise starting several hours before bedtime.  Use relaxation techniques to manage stress. Ask your health care provider to suggest some techniques that may work well for you. These may include: ? Breathing exercises. ? Routines to release muscle tension. ? Visualizing peaceful scenes.  Make sure that you drive carefully. Avoid driving if you feel very sleepy.  Keep all follow-up visits as told by your health care provider. This is important. Contact a health care provider if:  You are tired throughout the day.  You have trouble in your daily routine due to sleepiness.  You continue to have sleep problems, or your sleep problems get worse. Get help right  away if:  You have serious thoughts about hurting yourself or someone else. If you ever feel like you may hurt yourself or others, or have thoughts about taking your own life, get help right away. You can go to your nearest emergency department or call:  Your local emergency services (911 in the [place]  A suicide crisis helpline, such as the [place] at 314-217-3376. This is open 24 hours a day. Summary  Insomnia is a sleep disorder that makes it difficult to fall asleep or stay asleep.  Insomnia can be long-term (chronic) or short-term (acute).  Treatment for insomnia depends on the cause. Treatment may focus on treating an underlying condition that is causing insomnia.  Keep a sleep diary to help you and your health care provider figure out what could be causing your insomnia. This information is not intended to replace advice given to you by your health care provider. Make sure you discuss any questions you have with your health care provider. Document Revised: 04/30/2017 Document Reviewed: 02/25/2017 Elsevier Patient Education  2020 [employer].

## 2020-05-15 ENCOUNTER — Telehealth: Payer: Self-pay

## 2020-05-15 NOTE — Telephone Encounter (Signed)
Patient came to sleep lab for PSG last night and did not want to stay. She does not think she has a sleep disorder.She wasn't sure why she needed this and wanted to go home. Technician explained the doctors order and she still wanted to leave. No hook up was done.

## 2020-05-16 NOTE — Telephone Encounter (Signed)
Please dismiss.  To have [place] go through sleep study preparaion, authorization and a consult with her, to then show up and leave while sleep tech is ready and assigned to her is unacceptable.   I will send an FYI to her primary provider.  Larey Seat, MD

## 2020-05-21 ENCOUNTER — Encounter: Payer: Self-pay | Admitting: Neurology

## 2020-06-06 ENCOUNTER — Other Ambulatory Visit: Payer: Self-pay

## 2020-06-06 MED ORDER — LOSARTAN POTASSIUM 50 MG PO TABS
50.0000 mg | ORAL_TABLET | Freq: Every day | ORAL | 0 refills | Status: DC
Start: 2020-06-06 — End: 2020-08-29

## 2020-06-06 MED ORDER — AMLODIPINE BESYLATE 10 MG PO TABS
10.0000 mg | ORAL_TABLET | Freq: Every day | ORAL | 0 refills | Status: DC
Start: 2020-06-06 — End: 2020-07-08

## 2020-07-08 ENCOUNTER — Other Ambulatory Visit: Payer: Self-pay

## 2020-07-08 ENCOUNTER — Ambulatory Visit (INDEPENDENT_AMBULATORY_CARE_PROVIDER_SITE_OTHER): Payer: MEDICARE | Admitting: Nurse Practitioner

## 2020-07-08 ENCOUNTER — Encounter: Payer: Self-pay | Admitting: Nurse Practitioner

## 2020-07-08 VITALS — BP 140/76 | HR 89 | Temp 98.5°F | Ht 61.0 in | Wt 151.8 lb

## 2020-07-08 DIAGNOSIS — I10 Essential (primary) hypertension: Secondary | ICD-10-CM

## 2020-07-08 DIAGNOSIS — K219 Gastro-esophageal reflux disease without esophagitis: Secondary | ICD-10-CM

## 2020-07-08 DIAGNOSIS — R7309 Other abnormal glucose: Secondary | ICD-10-CM | POA: Diagnosis not present

## 2020-07-08 DIAGNOSIS — K648 Other hemorrhoids: Secondary | ICD-10-CM

## 2020-07-08 DIAGNOSIS — G4700 Insomnia, unspecified: Secondary | ICD-10-CM

## 2020-07-08 DIAGNOSIS — E2839 Other primary ovarian failure: Secondary | ICD-10-CM

## 2020-07-08 DIAGNOSIS — F411 Generalized anxiety disorder: Secondary | ICD-10-CM

## 2020-07-08 MED ORDER — AMLODIPINE BESYLATE 10 MG PO TABS: 10.0000 mg | ORAL_TABLET | Freq: Every day | ORAL | 0 refills | Status: DC

## 2020-07-08 MED ORDER — HYDROCORTISONE ACETATE 25 MG RE SUPP: 25.0000 mg | Freq: Two times a day (BID) | RECTAL | 3 refills | Status: DC | PRN

## 2020-07-08 MED ORDER — DEXLANSOPRAZOLE 60 MG PO CPDR: 60.0000 mg | DELAYED_RELEASE_CAPSULE | Freq: Every day | ORAL | 5 refills | Status: DC

## 2020-07-08 NOTE — Progress Notes (Signed)
I,Yamilka Roman [employer] as a Education administrator for [employer], FNP.,have documented all relevant documentation on the behalf of [name], FNP,as directed by  [name], FNP while in the presence of [name], [place]. This visit occurred during the SARS-CoV-2 public health emergency.  Safety protocols were in place, including screening questions prior to the visit, additional usage of staff PPE, and extensive cleaning of exam room while observing appropriate contact time as indicated for disinfecting solutions.  Subjective:     Patient ID: Deanna [name] , female    DOB: 01-09-1946 , 75 y.o.   MRN: [id number]   Chief Complaint  Patient presents with  . abnormal glucose    HPI  Patient presents today for a f/u on her abnormal glucose.  She is going to get her Covid Booster at her next convenience  She did go to the consultation with the sleep provider and was to have a sleep study done. However on the day of the sleep study she left prior to the testing.  She continues to have problems with insomnia and has been taking advil pm with relief. She was concerned that when the sleep provider gave her the possible diagnosis she did not include sleep apnea. She does report she has always been a short sleeper. She does not feel as though she is depressed and has not been to counseling since her daughter was young.      Past Medical History:  Diagnosis Date  . Arthritis   . Celiac disease   . Colon polyps   . GERD (gastroesophageal reflux disease)   . Hypertension   . Insomnia   . Spinal stenosis      Family History  Problem Relation Age of Onset  . Arthritis Mother   . Diabetes Mother   . Pancreatic cancer Mother   . Early death Father   . Leukemia Father   . Early death Sister   . Diabetes Sister      Current Outpatient Medications:  .  Ascorbic Acid (VITAMIN C) 1000 MG tablet, Take 1,000 mg by mouth daily., Disp: , Rfl:  .  azelastine (ASTELIN) 0.1 % nasal spray, PLACE 2  SPRAYS INTO BOTH NOSTRILS 2 TIMES DAILY AS DIRECTED, Disp: 30 mL, Rfl: 1 .  b complex vitamins capsule, Take 1 capsule by mouth daily., Disp: , Rfl:  .  Calcium-Magnesium-Vitamin D (CALCIUM MAGNESIUM PO), Take 1 tablet by mouth daily., Disp: , Rfl:  .  Cholecalciferol (VITAMIN D PO), Take 1,000 tablets by mouth daily. , Disp: , Rfl:  .  hydrochlorothiazide (HYDRODIURIL) 25 MG tablet, Take 25 mg by mouth daily. , Disp: , Rfl:  .  LORazepam (ATIVAN) 0.5 MG tablet, Take 1 tablet (0.5 mg total) by mouth every 8 (eight) hours., Disp: 20 tablet, Rfl: 0 .  losartan (COZAAR) 50 MG tablet, Take 1 tablet (50 mg total) by mouth daily., Disp: 90 tablet, Rfl: 0 .  montelukast (SINGULAIR) 10 MG tablet, Take 10 mg by mouth at bedtime., Disp: , Rfl:  .  Multiple Vitamin (MULTI-VITAMIN) tablet, Take by mouth., Disp: , Rfl:  .  amLODipine (NORVASC) 10 MG tablet, Take 1 tablet (10 mg total) by mouth daily., Disp: 90 tablet, Rfl: 0 .  dexlansoprazole (DEXILANT) 60 MG capsule, Take 1 capsule (60 mg total) by mouth daily., Disp: 30 capsule, Rfl: 5 .  hydrocortisone (ANUSOL-HC) 25 MG suppository, Place 1 suppository (25 mg total) rectally 2 (two) times daily as needed for hemorrhoids or anal itching., Disp: 12  suppository, Rfl: 3   Allergies  Allergen Reactions  . Gluten Meal Diarrhea and Other (See Comments)    Severe diarrhea per member   . Codeine   . Dust Mite Extract   . Gabapentin   . Terbinafine Hcl Rash     Review of Systems  Constitutional: Negative.   Respiratory: Negative.  Negative for cough and shortness of breath.   Cardiovascular: Negative.  Negative for chest pain, palpitations and leg swelling.  Neurological: Negative for dizziness and headaches.  Psychiatric/Behavioral: Positive for sleep disturbance (difficulty falling asleep and waking up throughout the night). The patient is nervous/anxious.      Today's Vitals   07/08/20 1130  BP: (!) 144/70  Pulse: 89  Temp: 98.5 F (36.9 C)   TempSrc: Oral  Weight: 151 lb 12.8 oz (68.9 kg)  Height: 5' 1"  (1.549 m)  PainSc: 0-No pain   Body mass index is 28.68 kg/m.   Objective:  Physical Exam Constitutional:      General: She is not in acute distress.    Appearance: Normal appearance. She is normal weight.  Cardiovascular:     Rate and Rhythm: Normal rate and regular rhythm.     Pulses: Normal pulses.     Heart sounds: Normal heart sounds.  Pulmonary:     Effort: Pulmonary effort is normal. No respiratory distress.     Breath sounds: Normal breath sounds. No wheezing.  Abdominal:     General: Abdomen is flat. Bowel sounds are normal.     Palpations: Abdomen is soft.  Musculoskeletal:     Cervical back: Normal range of motion and neck supple.  Skin:    General: Skin is warm and dry.     Capillary Refill: Capillary refill takes less than 2 seconds.     Coloration: Skin is not jaundiced.  Neurological:     General: No focal deficit present.     Mental Status: She is alert and oriented to [place], place, and time.     Cranial Nerves: No cranial nerve deficit.  Psychiatric:        Mood and Affect: Mood normal.        Behavior: Behavior normal.        Thought Content: Thought content normal.        Judgment: Judgment normal.         Assessment And Plan:     1. Abnormal glucose  Chronic, good control with diet and exercise  No current medications - Hemoglobin A1c  2. Essential hypertension . B/P was slightly elevated, repeat is slightly better. Her blood pressure can be affected by poor sleep.   . Kidney functions are normal  . The importance of regular exercise and dietary modification was stressed to the patient.  - amLODipine (NORVASC) 10 MG tablet; Take 1 tablet (10 mg total) by mouth daily.  Dispense: 90 tablet; Refill: 0  3. Gastroesophageal reflux disease without esophagitis  Chronic, she is tolerating dexilant well - dexlansoprazole (DEXILANT) 60 MG capsule; Take 1 capsule (60 mg total) by  mouth daily.  Dispense: 30 capsule; Refill: 5  4. Insomnia, unspecified type  She is willing to go for a sleep study with another provider  I also had an extensive conversation with her about depression and anxiety affecting sleep.   I talked with her about counseling and possibly dealing with issues subconciously affecting her sleep.  She is willing to also go for counseling. Referral placed for counseling and sleep provider  I explained to her the only way to know for sure she does not have sleep apnea is to do a sleep study  5. Internal hemorrhoids  Requesting refill on anusol - hydrocortisone (ANUSOL-HC) 25 MG suppository; Place 1 suppository (25 mg total) rectally 2 (two) times daily as needed for hemorrhoids or anal itching.  Dispense: 12 suppository; Refill: 3  6. Decreased estrogen level - DG Bone Density; Future  7. GAD (generalized anxiety disorder)  Chronic, she has not been using her lorazepam often   Will refer for counseling   Patient was given opportunity to ask questions. Patient verbalized understanding of the plan and was able to repeat key elements of the plan. All questions were answered to their satisfaction.  [name], FNP   I, [name], FNP, have reviewed all documentation for this visit. The documentation on 07/08/20 for the exam, diagnosis, procedures, and orders are all accurate and complete.   THE PATIENT IS ENCOURAGED TO PRACTICE SOCIAL DISTANCING DUE TO THE COVID-19 PANDEMIC.

## 2020-07-09 LAB — HEMOGLOBIN A1C
Est. average glucose Bld gHb Est-mCnc: 123 mg/dL
Hgb A1c MFr Bld: 5.9 % — ABNORMAL HIGH (ref 4.8–5.6)

## 2020-08-07 ENCOUNTER — Ambulatory Visit (INDEPENDENT_AMBULATORY_CARE_PROVIDER_SITE_OTHER): Payer: MEDICARE | Admitting: Psychology

## 2020-08-07 DIAGNOSIS — F5101 Primary insomnia: Secondary | ICD-10-CM

## 2020-08-07 DIAGNOSIS — F4322 Adjustment disorder with anxiety: Secondary | ICD-10-CM | POA: Diagnosis not present

## 2020-08-26 ENCOUNTER — Other Ambulatory Visit: Payer: Self-pay

## 2020-08-26 ENCOUNTER — Ambulatory Visit (INDEPENDENT_AMBULATORY_CARE_PROVIDER_SITE_OTHER): Payer: MEDICARE | Admitting: Nurse Practitioner

## 2020-08-26 ENCOUNTER — Encounter: Payer: Self-pay | Admitting: Nurse Practitioner

## 2020-08-26 VITALS — BP 130/78 | HR 75 | Temp 98.2°F | Ht 61.8 in | Wt 149.0 lb

## 2020-08-26 DIAGNOSIS — I119 Hypertensive heart disease without heart failure: Secondary | ICD-10-CM

## 2020-08-26 DIAGNOSIS — I10 Essential (primary) hypertension: Secondary | ICD-10-CM

## 2020-08-26 DIAGNOSIS — I447 Left bundle-branch block, unspecified: Secondary | ICD-10-CM

## 2020-08-26 DIAGNOSIS — I340 Nonrheumatic mitral (valve) insufficiency: Secondary | ICD-10-CM | POA: Diagnosis not present

## 2020-08-26 DIAGNOSIS — Z01818 Encounter for other preprocedural examination: Secondary | ICD-10-CM

## 2020-08-26 NOTE — Progress Notes (Signed)
I,Yamilka [employer] as a Education administrator for [employer], FNP.,have documented all relevant documentation on the behalf of [name], FNP,as directed by  [name], FNP while in the presence of [name], [place]. This visit occurred during the SARS-CoV-2 public health emergency.  Safety protocols were in place, including screening questions prior to the visit, additional usage of staff PPE, and extensive cleaning of exam room while observing appropriate contact time as indicated for disinfecting solutions.  Subjective:     Patient ID: Deanna [name] , female    DOB: 1946-02-08 , 75 y.o.   MRN: [id number]   No chief complaint on file.   HPI  Patient presents today for a pre op. She is planned to have left hip surgery on April 19th.  She has a history of "myocardial bridge". she has not seen a Film/video editor at [facility].      Past Medical History:  Diagnosis Date  . Arthritis   . Celiac disease   . Colon polyps   . GERD (gastroesophageal reflux disease)   . Hypertension   . Insomnia   . Spinal stenosis      Family History  Problem Relation Age of Onset  . Arthritis Mother   . Diabetes Mother   . Pancreatic cancer Mother   . Early death Father   . Leukemia Father   . Early death Sister   . Diabetes Sister      Current Outpatient Medications:  .  amLODipine (NORVASC) 10 MG tablet, Take 1 tablet (10 mg total) by mouth daily., Disp: 90 tablet, Rfl: 0 .  Ascorbic Acid (VITAMIN C) 1000 MG tablet, Take 1,000 mg by mouth daily., Disp: , Rfl:  .  azelastine (ASTELIN) 0.1 % nasal spray, PLACE 2 SPRAYS INTO BOTH NOSTRILS 2 TIMES DAILY AS DIRECTED, Disp: 30 mL, Rfl: 1 .  b complex vitamins capsule, Take 1 capsule by mouth daily., Disp: , Rfl:  .  Calcium-Magnesium-Vitamin D (CALCIUM MAGNESIUM PO), Take 1 tablet by mouth daily., Disp: , Rfl:  .  Cholecalciferol (VITAMIN D PO), Take 1,000 tablets by mouth daily. , Disp: , Rfl:  .  dexlansoprazole  (DEXILANT) 60 MG capsule, Take 1 capsule (60 mg total) by mouth daily., Disp: 30 capsule, Rfl: 5 .  hydrochlorothiazide (HYDRODIURIL) 25 MG tablet, Take 25 mg by mouth daily. , Disp: , Rfl:  .  hydrocortisone (ANUSOL-HC) 25 MG suppository, Place 1 suppository (25 mg total) rectally 2 (two) times daily as needed for hemorrhoids or anal itching., Disp: 12 suppository, Rfl: 3 .  LORazepam (ATIVAN) 0.5 MG tablet, Take 1 tablet (0.5 mg total) by mouth every 8 (eight) hours., Disp: 20 tablet, Rfl: 0 .  losartan (COZAAR) 50 MG tablet, Take 1 tablet (50 mg total) by mouth daily., Disp: 90 tablet, Rfl: 0 .  montelukast (SINGULAIR) 10 MG tablet, Take 10 mg by mouth at bedtime., Disp: , Rfl:  .  Multiple Vitamin (MULTI-VITAMIN) tablet, Take by mouth., Disp: , Rfl:    Allergies  Allergen Reactions  . Gluten Meal Diarrhea and Other (See Comments)    Severe diarrhea per member   . Codeine   . Dust Mite Extract   . Gabapentin   . Terbinafine Hcl Rash     Review of Systems  Constitutional: Negative.   Respiratory: Negative.   Cardiovascular: Negative.  Negative for chest pain, palpitations and leg swelling.  Neurological: Negative for dizziness and headaches.  Psychiatric/Behavioral: Negative.      Today's Vitals  08/26/20 1048  BP: 130/78  Pulse: 75  Temp: 98.2 F (36.8 C)  TempSrc: Oral  SpO2: 95%  Weight: 149 lb (67.6 kg)  Height: 5' 1.8" (1.57 m)  PainSc: 4   PainLoc: Hip   Body mass index is 27.43 kg/m.   Objective:  Physical Exam Constitutional:      General: She is not in acute distress.    Appearance: Normal appearance.  Cardiovascular:     Rate and Rhythm: Regular rhythm.  Neurological:     Mental Status: She is alert.         Assessment And Plan:     1. Essential hypertension  Chronic, good control   Continue with current medications - EKG 12-Lead - Ambulatory referral to Cardiology  2. Pre-op exam  She is scheduled for hip surgery on 4/19, her EKG shows  right bundle branch block and right atrial enlargement. She reports a previous history of this but has not seen the cardiologist in at 3-4 years.  I will refer her to Cardiology here in [place]  From a medical standpoint she is cleared for surgery with low risk - EKG 12-Lead - Ambulatory referral to Cardiology  3. Hypertensive left ventricular hypertrophy, without heart failure - EKG 12-Lead - Ambulatory referral to Cardiology  4. Non-rheumatic mitral regurgitation - EKG 12-Lead - Ambulatory referral to Cardiology  5. Left bundle-branch block  EKG done with sinus rhythm, left bundle branch block and right atrial enlargement - EKG 12-Lead - Ambulatory referral to Cardiology     Patient was given opportunity to ask questions. Patient verbalized understanding of the plan and was able to repeat key elements of the plan. All questions were answered to their satisfaction.  [name], FNP   I, [name], FNP, have reviewed all documentation for this visit. The documentation on 08/26/20 for the exam, diagnosis, procedures, and orders are all accurate and complete.   IF YOU HAVE BEEN REFERRED TO A SPECIALIST, IT MAY TAKE 1-2 WEEKS TO SCHEDULE/PROCESS THE REFERRAL. IF YOU HAVE NOT HEARD FROM US/SPECIALIST IN TWO WEEKS, PLEASE GIVE [place] A CALL AT [phone].   THE PATIENT IS ENCOURAGED TO PRACTICE SOCIAL DISTANCING DUE TO THE COVID-19 PANDEMIC.

## 2020-08-28 ENCOUNTER — Other Ambulatory Visit: Payer: Self-pay | Admitting: Nurse Practitioner

## 2020-09-01 ENCOUNTER — Encounter: Payer: Self-pay | Admitting: Nurse Practitioner

## 2020-09-02 ENCOUNTER — Other Ambulatory Visit: Payer: Self-pay

## 2020-09-02 DIAGNOSIS — M1990 Unspecified osteoarthritis, unspecified site: Secondary | ICD-10-CM | POA: Insufficient documentation

## 2020-09-02 DIAGNOSIS — K219 Gastro-esophageal reflux disease without esophagitis: Secondary | ICD-10-CM | POA: Insufficient documentation

## 2020-09-02 DIAGNOSIS — M4800 Spinal stenosis, site unspecified: Secondary | ICD-10-CM | POA: Insufficient documentation

## 2020-09-02 DIAGNOSIS — I10 Essential (primary) hypertension: Secondary | ICD-10-CM | POA: Insufficient documentation

## 2020-09-02 DIAGNOSIS — K635 Polyp of colon: Secondary | ICD-10-CM | POA: Insufficient documentation

## 2020-09-03 ENCOUNTER — Ambulatory Visit (INDEPENDENT_AMBULATORY_CARE_PROVIDER_SITE_OTHER): Payer: MEDICARE | Admitting: Cardiology

## 2020-09-03 ENCOUNTER — Encounter: Payer: Self-pay | Admitting: Cardiology

## 2020-09-03 ENCOUNTER — Other Ambulatory Visit: Payer: Self-pay

## 2020-09-03 ENCOUNTER — Ambulatory Visit (INDEPENDENT_AMBULATORY_CARE_PROVIDER_SITE_OTHER): Payer: MEDICARE | Admitting: Psychology

## 2020-09-03 VITALS — BP 130/60 | HR 84 | Ht 62.6 in | Wt 151.0 lb

## 2020-09-03 DIAGNOSIS — F5101 Primary insomnia: Secondary | ICD-10-CM

## 2020-09-03 DIAGNOSIS — Z01810 Encounter for preprocedural cardiovascular examination: Secondary | ICD-10-CM

## 2020-09-03 DIAGNOSIS — I10 Essential (primary) hypertension: Secondary | ICD-10-CM | POA: Diagnosis not present

## 2020-09-03 DIAGNOSIS — F4322 Adjustment disorder with anxiety: Secondary | ICD-10-CM

## 2020-09-03 DIAGNOSIS — I447 Left bundle-branch block, unspecified: Secondary | ICD-10-CM

## 2020-09-03 DIAGNOSIS — I340 Nonrheumatic mitral (valve) insufficiency: Secondary | ICD-10-CM | POA: Diagnosis not present

## 2020-09-03 HISTORY — DX: Encounter for preprocedural cardiovascular examination: Z01.810

## 2020-09-03 NOTE — Patient Instructions (Signed)
Medication Instructions:  No medication changes. *If you need a refill on your cardiac medications before your next appointment, please call your pharmacy*   Lab Work: None ordered If you have labs (blood work) drawn today and your tests are completely normal, you will receive your results only by: Marland Kitchen MyChart Message (if you have MyChart) OR . A paper copy in the mail If you have any lab test that is abnormal or we need to change your treatment, we will call you to review the results.   Testing/Procedures: Your physician has requested that you have an echocardiogram. Echocardiography is a painless test that uses sound waves to create images of your heart. It provides your doctor with information about the size and shape of your heart and how well your heart's chambers and valves are working. This procedure takes approximately one hour. There are no restrictions for this procedure.  Your physician has requested that you have a lexiscan myoview. For further information please visit [email or web address]. Please follow instruction sheet, as given.  The test will take approximately 3 to 4 hours to complete; you may bring reading material.  If someone comes with you to your appointment, they will need to remain in the main lobby due to limited space in the testing area.   How to prepare for your Myocardial Perfusion Test: . Do not eat or drink 3 hours prior to your test, except you may have water. . Do not consume products containing caffeine (regular or decaffeinated) 12 hours prior to your test. (ex: coffee, chocolate, sodas, tea). . Do bring a list of your current medications with you.  If not listed below, you may take your medications as normal. . Do wear comfortable clothes (no dresses or overalls) and walking shoes, tennis shoes preferred (No heels or open toe shoes are allowed). . Do NOT wear cologne, perfume, aftershave, or lotions (deodorant is allowed). . If these instructions are not  followed, your test will have to be rescheduled.    Follow-Up: At [facility], you and your health needs are our priority.  As part of our continuing mission to provide you with exceptional heart care, we have created designated Provider Care Teams.  These Care Teams include your primary Cardiologist (physician) and Advanced Practice Providers (APPs -  Physician Assistants and Nurse Practitioners) who all work together to provide you with the care you need, when you need it.  We recommend signing up for the patient portal called "MyChart".  Sign up information is provided on this After Visit Summary.  MyChart is used to connect with patients for Virtual Visits (Telemedicine).  Patients are able to view lab/test results, encounter notes, upcoming appointments, etc.  Non-urgent messages can be sent to your provider as well.   To learn more about what you can do with MyChart, go to [email or web address].    Your next appointment:   6 month(s)  The format for your next appointment:   In [place]  Provider:   Jyl Heinz, MD   Other Instructions  Cardiac Nuclear Scan  A cardiac nuclear scan is a test that is done to check the flow of blood to your heart. It is done when you are resting and when you are exercising. The test looks for problems such as:  Not enough blood reaching a portion of the heart.  The heart muscle not working as it should. You may need this test if:  You have heart disease.  You have had lab results that are not  normal.  You have had heart surgery or a balloon procedure to open up blocked arteries (angioplasty).  You have chest pain.  You have shortness of breath. In this test, a special dye (tracer) is put into your bloodstream. The tracer will travel to your heart. A camera will then take pictures of your heart to see how the tracer moves through your heart. This test is usually done at a hospital and takes 2-4 hours. Tell a doctor about:  Any  allergies you have.  All medicines you are taking, including vitamins, herbs, eye drops, creams, and over-the-counter medicines.  Any problems you or family members have had with anesthetic medicines.  Any blood disorders you have.  Any surgeries you have had.  Any medical conditions you have.  Whether you are pregnant or may be pregnant. What are the risks? Generally, this is a safe test. However, problems may occur, such as:  Serious chest pain and heart attack. This is only a risk if the stress portion of the test is done.  Rapid heartbeat.  A feeling of warmth in your chest. This feeling usually does not last long.  Allergic reaction to the tracer. What happens before the test?  Ask your doctor about changing or stopping your normal medicines. This is important.  Follow instructions from your doctor about what you cannot eat or drink.  Remove your jewelry on the day of the test. What happens during the test? 1. An IV tube will be inserted into one of your veins. 2. Your doctor will give you a small amount of tracer through the IV tube. 3. You will wait for 20-40 minutes while the tracer moves through your bloodstream. 4. Your heart will be monitored with an electrocardiogram (ECG). 5. You will lie down on an exam table. 6. Pictures of your heart will be taken for about 15-20 minutes. 7. You may also have a stress test. For this test, one of these things may be done: ? You will be asked to exercise on a treadmill or a stationary bike. ? You will be given medicines that will make your heart work harder. This is done if you are unable to exercise. 8. When blood flow to your heart has peaked, a tracer will again be given through the IV tube. 9. After 20-40 minutes, you will get back on the exam table. More pictures will be taken of your heart. 10. Depending on the tracer that is used, more pictures may need to be taken 3-4 hours later. 11. Your IV tube will be removed when  the test is over. The test may vary among doctors and hospitals. What happens after the test? 1. Ask your doctor: ? Whether you can return to your normal schedule, including diet, activities, and medicines. ? Whether you should drink more fluids. This will help to remove the tracer from your body. Drink enough fluid to keep your pee (urine) pale yellow. 2. Ask your doctor, or the department that is doing the test: ? When will my results be ready? ? How will I get my results? Summary  A cardiac nuclear scan is a test that is done to check the flow of blood to your heart.  Tell your doctor whether you are pregnant or may be pregnant.  Before the test, ask your doctor about changing or stopping your normal medicines. This is important.  Ask your doctor whether you can return to your normal activities. You may be asked to drink more fluids. This  information is not intended to replace advice given to you by your health care provider. Make sure you discuss any questions you have with your health care provider. Document Revised: 09/07/2018 Document Reviewed: 11/01/2017 Elsevier Patient Education  [place].  Echocardiogram An echocardiogram is a procedure that uses painless sound waves (ultrasound) to produce an image of the heart. Images from an echocardiogram can provide important information about:  Signs of coronary artery disease (CAD).  Aneurysm detection. An aneurysm is a weak or damaged part of an artery wall that bulges out from the normal force of blood pumping through the body.  Heart size and shape. Changes in the size or shape of the heart can be associated with certain conditions, including heart failure, aneurysm, and CAD.  Heart muscle function.  Heart valve function.  Signs of a past heart attack.  Fluid buildup around the heart.  Thickening of the heart muscle.  A tumor or infectious growth around the heart valves. Tell a health care provider  about:  Any allergies you have.  All medicines you are taking, including vitamins, herbs, eye drops, creams, and over-the-counter medicines.  Any blood disorders you have.  Any surgeries you have had.  Any medical conditions you have.  Whether you are pregnant or may be pregnant. What are the risks? Generally, this is a safe procedure. However, problems may occur, including:  Allergic reaction to dye (contrast) that may be used during the procedure. What happens before the procedure? No specific preparation is needed. You may eat and drink normally. What happens during the procedure?    An IV tube may be inserted into one of your veins.  You may receive contrast through this tube. A contrast is an injection that improves the quality of the pictures from your heart.  A gel will be applied to your chest.  A wand-like tool (transducer) will be moved over your chest. The gel will help to transmit the sound waves from the transducer.  The sound waves will harmlessly bounce off of your heart to allow the heart images to be captured in real-time motion. The images will be recorded on a computer. The procedure may vary among health care providers and hospitals. What happens after the procedure?  You may return to your normal, everyday life, including diet, activities, and medicines, unless your health care provider tells you not to do that. Summary  An echocardiogram is a procedure that uses painless sound waves (ultrasound) to produce an image of the heart.  Images from an echocardiogram can provide important information about the size and shape of your heart, heart muscle function, heart valve function, and fluid buildup around your heart.  You do not need to do anything to prepare before this procedure. You may eat and drink normally.  After the echocardiogram is completed, you may return to your normal, everyday life, unless your health care provider tells you not to do  that. This information is not intended to replace advice given to you by your health care provider. Make sure you discuss any questions you have with your health care provider. Document Revised: 09/08/2018 Document Reviewed: 06/20/2016 Elsevier Patient Education  [place].

## 2020-09-03 NOTE — Progress Notes (Signed)
Cardiology Office Note:    Date:  09/03/2020   ID:  Deanna [name], DOB June 13, 1945, MRN [id number]  PCP:  [name], FNP  Cardiologist:  Jenean Lindau, MD   Referring MD: [name], FNP    ASSESSMENT:    1. Left bundle-branch block   2. Pre-operative cardiovascular examination   3. Primary hypertension   4. Non-rheumatic mitral regurgitation    PLAN:    In order of problems listed above:  1. Primary prevention stressed to the patient.  Importance of compliance with diet medication stressed and she vocalized understanding. 2. Essential hypertension: Blood pressure stable.  Diet, lifestyle modification urged and she understands. 3. Left bundle branch block: Stable at this time.  We will continue to monitor. 4. Preop assessment: Patient has multiple risk factors for coronary artery disease and leads a sedentary lifestyle.  I discussed Lexiscan sestamibi and she is agreeable.  If this is negative then she is not at high risk for coronary events during the aforementioned surgery.  Meticulous hemodynamic monitoring will further reduce the risk of coronary events. 5. Mitral regurgitation: Patient gives history and has appointment we will do an echocardiogram to follow this. 6. Patient will be seen in follow-up appointment in 6 months or earlier if the patient has any concerns    Medication Adjustments/Labs and Tests Ordered: Current medicines are reviewed at length with the patient today.  Concerns regarding medicines are outlined above.  No orders of the defined types were placed in this encounter.  No orders of the defined types were placed in this encounter.    No chief complaint on file.    History of Present Illness:    Deanna [name] is a 75 y.o. female.  Patient is referred for preop assessment for hip replacement surgery.  Patient has history of essential hypertension, left bundle branch block.  She has history of mitral regurgitation.  She mentions to me that  because of orthopedic issues she leads a sedentary lifestyle.  No chest pain orthopnea or PND.  Her hemoglobin A1c was mildly elevated.  For this reason she sent here for evaluation.  At the time of my evaluation, the patient is alert awake oriented and in no distress.  Past Medical History:  Diagnosis Date  . Abnormal glucose 03/06/2020  . Age-related osteoporosis without current pathological fracture 11/22/2015   Overview:  Femoral neck  Formatting of this note might be different from the original. Femoral neck  . Allergic rhinitis due to pollen 08/19/2015  . Arthritis   . Carpal tunnel syndrome, bilateral 02/28/2002   Overview:  H/O CTS  . Celiac disease   . Cervical disc disorder 05/10/2009  . Chronic cough 02/15/2006  . 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 Added automatically from request for surgery [id number]  . Chronic pain syndrome 04/01/2015  . Colon polyps   . Essential hypertension 07/20/2000   Overview:  HBP  . Female cystocele 02/27/2009  . GAD (generalized anxiety disorder) 08/19/2015  . GERD (gastroesophageal reflux disease)   . History of Bell's palsy 03/21/2015  . Hx of adenomatous colonic polyps 12/20/2008   Overview:  Colonoscopy 07/2003 - LMD - polyps removed - recommended repeat in 3 years Colonoscopy 09/2011 - Dr. Marian Sorrow - hyperplastic polyp(s) were removed - repeat in 5 years  . Hypertension   . Incomplete uterovaginal prolapse 03/04/2004  . Insomnia   . Left bundle-branch block 05/20/2006  .  Lumbar radiculopathy 01/09/2016   Formatting of this note might be different from the original. Added automatically from request for surgery [id number]  . LVH (left ventricular 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 05/23/2015  . Non-rheumatic mitral regurgitation 03/21/2015  . Osteopenia determined by x-ray 11/22/2015   Formatting of this note might be different from the original. Femoral neck   . Other intervertebral disc degeneration, lumbar region 01/29/2015  . Primary osteoarthritis, right hand 02/28/2002   Overview:  OA HANDS, ESP THUMBS  . Radial tunnel syndrome, right 11/04/2016  . Radiculopathy, lumbar region 11/22/2014   Overview:  Added automatically from request for surgery [id number]  . Rhinitis, allergic 02/24/2002   Overview:  ALLERGY SYMPTOMS - states was seen by allergist in past and informed allergic to dust  . Sacroiliitis ([place]) 08/02/2019  . Screening for colon cancer 04/17/2005   Formatting of this note might be different from the original. Colonoscopy 2005  . Sensorineural hearing loss (SNHL) of both ears 07/05/2019  . Short sleeper 04/29/2020  . Snoring 04/29/2020  . Spinal stenosis   . Spinal stenosis, lumbar region without neurogenic claudication 11/22/2014  . Tinnitus aurium, bilateral 07/05/2019  . Trigger thumb, unspecified thumb 02/28/2002   Formatting of this note might be different from the original. TRIGGER THUMBS - started spring 2003. Injected 02/28/02  . Unspecified asthma, uncomplicated 0/37/0488    Past Surgical History:  Procedure Laterality Date  . ABDOMINAL HYSTERECTOMY  2010  . APPENDECTOMY  1969  . CHOLECYSTECTOMY  1969  . SPINAL FUSION  01/2016  . TONSILLECTOMY AND ADENOIDECTOMY  1956    Current Medications: Current Meds  Medication Sig  . azelastine (ASTELIN) 0.1 % nasal spray Place 2 sprays into both nostrils 2 (two) times daily. Use in each nostril as directed  . LORazepam (ATIVAN) 0.5 MG tablet Take 0.5 mg by mouth every 8 (eight) hours as needed for anxiety.  . MULTIPLE VITAMIN PO Take 1 tablet by mouth daily.     Allergies:   Gluten meal, Codeine, Dust mite extract, Gabapentin, and Terbinafine hcl   Social History   Socioeconomic History  . Marital status: Divorced    Spouse name: Not on file  . Number of children: 2  . Years of education: 2 years college  . Highest education level: Not on file  Occupational History  .  Occupation: Retired  Tobacco Use  . Smoking status: Former Smoker    Types: Cigarettes    Quit date: 12/31/1997    Years since quitting: 22.6  . Smokeless tobacco: Never Used  Vaping Use  . Vaping Use: Never used  Substance and Sexual Activity  . Alcohol use: Yes    Comment: social only  . Drug use: No  . Sexual activity: Not Currently  Other Topics Concern  . Not on file  Social History Narrative   Lives alone.   Right-handed.   One cup caffeine per day.   Social Determinants of Health   Financial Resource Strain: Medium Risk  . Difficulty of Paying Living Expenses: Somewhat hard  Food Insecurity: No Food Insecurity  . Worried About Charity fundraiser in the Last Year: Never true  . Ran Out of Food in the Last Year: Never true  Transportation Needs: No Transportation Needs  . Lack of Transportation (Medical): No  . Lack of Transportation (Non-Medical): No  Physical Activity: Sufficiently Active  . Days of Exercise per Week: 3 days  . Minutes of Exercise per Session: 60 min  Stress: No  Stress Concern Present  . Feeling of Stress : Not at all  Social Connections: Not on file     Family History: The patient's family history includes Arthritis in her mother; Diabetes in her mother and sister; Early death in her father and sister; Leukemia in her father; Pancreatic cancer in her mother.  ROS:   Please see the history of present illness.    All other systems reviewed and are negative.  EKGs/Labs/Other Studies Reviewed:    The following studies were reviewed today: EKG reveals sinus rhythm and left bundle branch block.   Recent Labs: 09/25/2019: TSH 1.410 03/06/2020: BUN 12; Creatinine, Ser 0.63; Potassium 3.7; Sodium 142  Recent Lipid Panel No results found for: CHOL, TRIG, HDL, CHOLHDL, VLDL, LDLCALC, LDLDIRECT  Physical Exam:    VS:  BP 130/60   Pulse 84   Ht 5' 2.6" (1.59 m)   Wt 151 lb (68.5 kg)   SpO2 97%   BMI 27.09 kg/m     Wt Readings from Last 3  Encounters:  09/03/20 151 lb (68.5 kg)  08/26/20 149 lb (67.6 kg)  07/08/20 151 lb 12.8 oz (68.9 kg)     GEN: Patient is in no acute distress HEENT: Normal NECK: No JVD; No carotid bruits LYMPHATICS: No lymphadenopathy CARDIAC: Hear sounds regular, 2/6 systolic murmur at the apex. RESPIRATORY:  Clear to auscultation without rales, wheezing or rhonchi  ABDOMEN: Soft, non-tender, non-distended MUSCULOSKELETAL:  No edema; No deformity  SKIN: Warm and dry NEUROLOGIC:  Alert and oriented x 3 PSYCHIATRIC:  Normal affect   Signed, Jenean Lindau, MD  09/03/2020 4:24 PM    [facility]

## 2020-09-04 ENCOUNTER — Telehealth (HOSPITAL_COMMUNITY): Payer: Self-pay

## 2020-09-04 NOTE — Addendum Note (Signed)
Addended by: Jyl Heinz R on: 09/04/2020 09:13 AM   Modules accepted: Orders

## 2020-09-04 NOTE — Telephone Encounter (Signed)
Detailed instructions left on the patient's answering machine. Asked to call back with any questions. S.Denaly Gatling EMTP 

## 2020-09-04 NOTE — Addendum Note (Signed)
Addended by: Truddie Hidden on: 09/04/2020 09:12 AM   Modules accepted: Orders

## 2020-09-05 ENCOUNTER — Other Ambulatory Visit: Payer: Self-pay

## 2020-09-05 ENCOUNTER — Ambulatory Visit (HOSPITAL_COMMUNITY): Payer: MEDICARE

## 2020-09-05 ENCOUNTER — Encounter (HOSPITAL_COMMUNITY): Payer: Self-pay | Admitting: *Deleted

## 2020-09-05 NOTE — Progress Notes (Signed)
Patient ID: Deanna [name], female   DOB: Aug 15, 1945, 75 y.o.   MRN: [id number]  Patient arrived for Nuclear Stress Test.  She tested positive for Covid on 09/01/20.  Patient rescheduled for Stress test for 09/13/20.  Kirstie Peri, RN

## 2020-09-06 ENCOUNTER — Ambulatory Visit (HOSPITAL_BASED_OUTPATIENT_CLINIC_OR_DEPARTMENT_OTHER)
Admission: RE | Admit: 2020-09-06 | Discharge: 2020-09-06 | Disposition: A | Discharge status: Home / Self Care | Payer: MEDICARE | Source: Ambulatory Visit | Attending: Cardiology | Admitting: Cardiology

## 2020-09-06 DIAGNOSIS — Z01810 Encounter for preprocedural cardiovascular examination: Secondary | ICD-10-CM | POA: Diagnosis not present

## 2020-09-06 DIAGNOSIS — I10 Essential (primary) hypertension: Secondary | ICD-10-CM | POA: Diagnosis not present

## 2020-09-06 DIAGNOSIS — I340 Nonrheumatic mitral (valve) insufficiency: Secondary | ICD-10-CM

## 2020-09-09 ENCOUNTER — Telehealth: Payer: Self-pay

## 2020-09-09 LAB — ECHOCARDIOGRAM COMPLETE
Area-P 1/2: 2.56 cm2
MV M vel: 4.42 m/s
MV Peak grad: 78.1 mmHg
S' Lateral: 1.89 cm

## 2020-09-09 NOTE — Telephone Encounter (Signed)
Patient requesting update on echocardiogram results for preop.

## 2020-09-09 NOTE — Telephone Encounter (Signed)
FYI.it was not read on Friday for some reason. Unremarkable echo. Impaired relaxation. Mild MR and TR.

## 2020-09-10 NOTE — Telephone Encounter (Signed)
Results reviewed with pt as per Dr. Julien Nordmann note.  Pt verbalized understanding and had no additional questions. Routed to PCP.

## 2020-09-10 NOTE — Patient Instructions (Addendum)
DUE TO COVID-19 ONLY ONE VISITOR IS ALLOWED TO COME WITH YOU AND STAY IN THE WAITING ROOM ONLY DURING PRE OP AND PROCEDURE DAY OF SURGERY. THE 2 VISITORS  MAY VISIT WITH YOU AFTER SURGERY IN YOUR PRIVATE ROOM DURING VISITING HOURS ONLY!  ,  PLEASE BEGIN THE QUARANTINE INSTRUCTIONS AS OUTLINED IN YOUR HANDOUT.                Deanna [name]   Your procedure is scheduled on: 09/17/20   Report to [facility] Main  Entrance   Report to admitting at 7:35 AM     Call this number if you have problems the morning of surgery [place], NO CHEWING GUM [place].   No food after midnight.    You may have clear liquid until 7:00 AM.    At 6:30 AM drink pre surgery drink  . Nothing by mouth after 7:00 AM.   Take these medicines the morning of surgery with A SIP OF WATER: Amlodipine                                 You may not have any metal on your body including               piercings  Do not wear jewelry, lotions, powders, perfumes or deodorant     Do not bring valuables to the hospital. [place].  Contacts, dentures or bridgework may not be worn into surgery.       Special Instructions: N/A              Please read over the following fact sheets you were given: _____________________________________________________________________             [facility] - Preparing for Surgery Before surgery, you can play an important role.  Because skin is not sterile, your skin needs to be as free of germs as possible.  You can reduce the number of germs on your skin by washing with CHG (chlorahexidine gluconate) soap before surgery.  CHG is an antiseptic cleaner which kills germs and bonds with the skin to continue killing germs even after washing. Please DO NOT use if you have an allergy to CHG or antibacterial soaps.  If your skin becomes reddened/irritated stop  using the CHG and inform your nurse when you arrive at Short Stay. Do not shave (including legs and underarms) for at least 48 hours prior to the first CHG shower. . Please follow these instructions carefully:   1.  Shower with CHG Soap the night before surgery and the  morning of Surgery.  2.  If you choose to wash your hair, wash your hair first as usual with your  normal  shampoo.  3.  After you shampoo, rinse your hair and body thoroughly to remove the  shampoo.                                        4.  Use CHG as you would any other liquid soap.  You can apply chg directly  to the skin and wash  Gently with a scrungie or clean washcloth.  5.  Apply the CHG Soap to your body ONLY FROM THE NECK DOWN.   Do not use on face/ open                           Wound or open sores. Avoid contact with eyes, ears mouth and genitals (private parts).                       Wash face,  Genitals (private parts) with your normal soap.             6.  Wash thoroughly, paying special attention to the area where your surgery  will be performed.  7.  Thoroughly rinse your body with warm water from the neck down.  8.  DO NOT shower/wash with your normal soap after using and rinsing off  the CHG Soap.             9.  Pat yourself dry with a clean towel.            10.  Wear clean pajamas.            11.  Place clean sheets on your bed the night of your first shower and do not  sleep with pets. Day of Surgery : Do not apply any lotions/deodorants the morning of surgery.  Please wear clean clothes to the hospital/surgery center.  FAILURE TO FOLLOW THESE INSTRUCTIONS MAY RESULT IN THE CANCELLATION OF YOUR SURGERY PATIENT SIGNATURE_________________________________  NURSE SIGNATURE__________________________________  ________________________________________________________________________   Deanna [name]  An incentive spirometer is a tool that can help keep your lungs clear and  active. This tool measures how well you are filling your lungs with each breath. Taking long deep breaths may help reverse or decrease the chance of developing breathing (pulmonary) problems (especially infection) following:  A long period of time when you are unable to move or be active. BEFORE THE PROCEDURE   If the spirometer includes an indicator to show your best effort, your nurse or respiratory therapist will set it to a desired goal.  If possible, sit up straight or lean slightly forward. Try not to slouch.  Hold the incentive spirometer in an upright position. INSTRUCTIONS FOR USE  1. Sit on the edge of your bed if possible, or sit up as far as you can in bed or on a chair. 2. Hold the incentive spirometer in an upright position. 3. Breathe out normally. 4. Place the mouthpiece in your mouth and seal your lips tightly around it. 5. Breathe in slowly and as deeply as possible, raising the piston or the ball toward the top of the column. 6. Hold your breath for 3-5 seconds or for as long as possible. Allow the piston or ball to fall to the bottom of the column. 7. Remove the mouthpiece from your mouth and breathe out normally. 8. Rest for a few seconds and repeat Steps 1 through 7 at least 10 times every 1-2 hours when you are awake. Take your time and take a few normal breaths between deep breaths. 9. The spirometer may include an indicator to show your best effort. Use the indicator as a goal to work toward during each repetition. 10. After each set of 10 deep breaths, practice coughing to be sure your lungs are clear. If you have an incision (the cut made at the time of surgery), support your incision  when coughing by placing a pillow or rolled up towels firmly against it. Once you are able to get out of bed, walk around indoors and cough well. You may stop using the incentive spirometer when instructed by your caregiver.  RISKS AND COMPLICATIONS  Take your time so you do not get  dizzy or light-headed.  If you are in pain, you may need to take or ask for pain medication before doing incentive spirometry. It is harder to take a deep breath if you are having pain. AFTER USE  Rest and breathe slowly and easily.  It can be helpful to keep track of a log of your progress. Your caregiver can provide you with a simple table to help with this. If you are using the spirometer at home, follow these instructions: [place] IF:   You are having difficultly using the spirometer.  You have trouble using the spirometer as often as instructed.  Your pain medication is not giving enough relief while using the spirometer.  You develop fever of 100.5 F (38.1 C) or higher. SEEK IMMEDIATE MEDICAL CARE IF:   You cough up bloody sputum that had not been present before.  You develop fever of 102 F (38.9 C) or greater.  You develop worsening pain at or near the incision site. MAKE SURE YOU:   Understand these instructions.  Will watch your condition.  Will get help right away if you are not doing well or get worse. Document Released: 09/28/2006 Document Revised: 08/10/2011 Document Reviewed: 11/29/2006 [facility] Patient Information 2014 [place], [place].   ________________________________________________________________________

## 2020-09-11 ENCOUNTER — Encounter (HOSPITAL_COMMUNITY): Payer: Self-pay

## 2020-09-11 ENCOUNTER — Encounter (HOSPITAL_COMMUNITY)
Admission: RE | Admit: 2020-09-11 | Discharge: 2020-09-11 | Disposition: A | Discharge status: Home / Self Care | Payer: MEDICARE | Source: Ambulatory Visit | Attending: Orthopedic Surgery | Admitting: Orthopedic Surgery

## 2020-09-11 ENCOUNTER — Telehealth (HOSPITAL_COMMUNITY): Payer: Self-pay | Admitting: *Deleted

## 2020-09-11 ENCOUNTER — Other Ambulatory Visit: Payer: Self-pay

## 2020-09-11 DIAGNOSIS — Z01812 Encounter for preprocedural laboratory examination: Secondary | ICD-10-CM | POA: Insufficient documentation

## 2020-09-11 NOTE — Progress Notes (Signed)
COVID Vaccine Completed:yes Date COVID Vaccine completed:09/05/19-booster 07/20/20 COVID vaccine manufacturer: Pfizer     PCP - [name] FNP Cardiologist - Dr. Alfonso Patten. [place]  Chest x-ray - no EKG - 09/03/20-epic Stress Test - no ECHO - 09/06/20-epic Cardiac Cath - no Pacemaker/ICD device last checked:NA  Sleep Study - no CPAP -   Fasting Blood Sugar - NA Checks Blood Sugar _____ times a day  Blood Thinner Instructions:NA Aspirin Instructions: Last Dose:  Anesthesia review:   Patient denies shortness of breath, fever, cough and chest pain at PAT appointment  yes Patient verbalized understanding of instructions that were given to them at the PAT appointment. Patient was also instructed that they will need to review over the PAT instructions again at home before surgery.yes Pt doesn't climb stairs but has no SOB doing housework or with ADLs" unless it is hurting bad." She had Covid 09/01/20. Documentation is on the chart.

## 2020-09-11 NOTE — Telephone Encounter (Signed)
Patient given detailed instructions per Myocardial Perfusion Study Information Sheet for the test on 09/13/20 at 7:15. Patient notified to arrive 15 minutes early and that it is imperative to arrive on time for appointment to keep from having the test rescheduled.  If you need to cancel or reschedule your appointment, please call the office within 24 hours of your appointment. . Patient verbalized understanding.Deanna [name]

## 2020-09-13 ENCOUNTER — Ambulatory Visit (HOSPITAL_COMMUNITY): Payer: MEDICARE | Attending: Internal Medicine

## 2020-09-13 ENCOUNTER — Other Ambulatory Visit: Payer: Self-pay

## 2020-09-13 VITALS — Ht 62.0 in | Wt 151.0 lb

## 2020-09-13 DIAGNOSIS — I447 Left bundle-branch block, unspecified: Secondary | ICD-10-CM | POA: Insufficient documentation

## 2020-09-13 DIAGNOSIS — Z01810 Encounter for preprocedural cardiovascular examination: Secondary | ICD-10-CM

## 2020-09-13 LAB — MYOCARDIAL PERFUSION IMAGING
LV dias vol: 76 mL (ref 46–106)
LV sys vol: 32 mL
Peak HR: 106 {beats}/min
Rest HR: 85 {beats}/min
SDS: 0
SRS: 0
SSS: 0
TID: 1.11

## 2020-09-13 MED ORDER — TECHNETIUM TC 99M TETROFOSMIN IV KIT
29.4000 | PACK | Freq: Once | INTRAVENOUS | Status: AC | PRN
  Administered 2020-09-13: 29.4 via INTRAVENOUS
  Filled 2020-09-13: qty 30

## 2020-09-13 MED ORDER — REGADENOSON 0.4 MG/5ML IV SOLN
0.4000 mg | Freq: Once | INTRAVENOUS | Status: AC
Start: 2020-09-13 — End: 2020-09-13
  Administered 2020-09-13: 0.4 mg via INTRAVENOUS

## 2020-09-13 MED ORDER — TECHNETIUM TC 99M TETROFOSMIN IV KIT
9.4000 | PACK | Freq: Once | INTRAVENOUS | Status: AC | PRN
  Administered 2020-09-13: 9.4 via INTRAVENOUS
  Filled 2020-09-13: qty 10

## 2020-09-16 ENCOUNTER — Encounter (HOSPITAL_COMMUNITY)
Admission: RE | Admit: 2020-09-16 | Discharge: 2020-09-16 | Disposition: A | Discharge status: Home / Self Care | Payer: MEDICARE | Source: Ambulatory Visit | Attending: Orthopedic Surgery | Admitting: Orthopedic Surgery

## 2020-09-16 ENCOUNTER — Telehealth: Payer: Self-pay | Admitting: Cardiology

## 2020-09-16 ENCOUNTER — Other Ambulatory Visit: Payer: Self-pay

## 2020-09-16 DIAGNOSIS — Z01812 Encounter for preprocedural laboratory examination: Secondary | ICD-10-CM | POA: Insufficient documentation

## 2020-09-16 LAB — COMPREHENSIVE METABOLIC PANEL
ALT: 19 U/L (ref 0–44)
AST: 22 U/L (ref 15–41)
Albumin: 4.5 g/dL (ref 3.5–5.0)
Alkaline Phosphatase: 72 U/L (ref 38–126)
Anion gap: 9 (ref 5–15)
BUN: 16 mg/dL (ref 8–23)
CO2: 26 mmol/L (ref 22–32)
Calcium: 10.2 mg/dL (ref 8.9–10.3)
Chloride: 107 mmol/L (ref 98–111)
Creatinine, Ser: 0.46 mg/dL (ref 0.44–1.00)
GFR, Estimated: >60 mL/min (ref >60)
Glucose, Bld: 113 mg/dL — ABNORMAL HIGH (ref 70–99)
Potassium: 3.7 mmol/L (ref 3.5–5.1)
Sodium: 142 mmol/L (ref 135–145)
Total Bilirubin: 0.6 mg/dL (ref 0.3–1.2)
Total Protein: 7.9 g/dL (ref 6.5–8.1)

## 2020-09-16 LAB — CBC
HCT: 42.9 % (ref 36.0–46.0)
Hemoglobin: 14 g/dL (ref 12.0–15.0)
MCH: 27.5 pg (ref 26.0–34.0)
MCHC: 32.6 g/dL (ref 30.0–36.0)
MCV: 84.3 fL (ref 80.0–100.0)
Platelets: 243 10*3/uL (ref 150–400)
RBC: 5.09 MIL/uL (ref 3.87–5.11)
RDW: 13.5 % (ref 11.5–15.5)
WBC: 6.6 10*3/uL (ref 4.0–10.5)
nRBC: 0 % (ref 0.0–0.2)

## 2020-09-16 LAB — SURGICAL PCR SCREEN
MRSA, PCR: NEGATIVE
Staphylococcus aureus: NEGATIVE

## 2020-09-16 LAB — PROTIME-INR
INR: 1.1 (ref 0.8–1.2)
Prothrombin Time: 13.8 seconds (ref 11.4–15.2)

## 2020-09-16 LAB — APTT: aPTT: 26 seconds (ref 24–36)

## 2020-09-16 NOTE — Telephone Encounter (Signed)
Spoke with Deanna [name] who is aware that the stress and echo were normal and pt is cleared for her surgery.

## 2020-09-16 NOTE — H&P (Signed)
TOTAL HIP ADMISSION H&P  Patient is admitted for left total hip arthroplasty.  Subjective:  Chief Complaint: left hip pain  HPI: Deanna [name], 75 y.o. female, has a history of pain and functional disability in the left hip(s) due to arthritis and patient has failed non-surgical conservative treatments for greater than 12 weeks to include NSAID's and/or analgesics and activity modification.  Onset of symptoms was gradual starting 2 years ago with gradually worsening course since that time.The patient noted no past surgery on the left hip(s).  Patient currently rates pain in the left hip at 7 out of 10 with activity. Patient has worsening of pain with activity and weight bearing and pain that interfers with activities of daily living. Patient has evidence of joint space narrowing by imaging studies. This condition presents safety issues increasing the risk of falls..  There is no current active infection.  Patient Active Problem List   Diagnosis Date Noted  . Pre-operative cardiovascular examination 09/03/2020  . Arthritis   . Colon polyps   . GERD (gastroesophageal reflux disease)   . Hypertension   . Spinal stenosis   . Short sleeper 04/29/2020  . Snoring 04/29/2020  . Insomnia 03/06/2020  . Abnormal glucose 03/06/2020  . Sacroiliitis ([place]) 08/02/2019  . Sensorineural hearing loss (SNHL) of both ears 07/05/2019  . Tinnitus aurium, bilateral 07/05/2019  . Radial tunnel syndrome, right 11/04/2016  . Lumbar radiculopathy 01/09/2016  . 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 11/26/2015  . Age-related osteoporosis without current pathological fracture 11/22/2015  . Osteopenia determined by x-ray 11/22/2015  . Allergic rhinitis due to pollen 08/19/2015  . GAD (generalized anxiety disorder) 08/19/2015  . LVH (left ventricular 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 05/23/2015  . Chronic pain syndrome 04/01/2015  . Non-rheumatic mitral regurgitation 03/21/2015  .  History of Bell's palsy 03/21/2015  . Other intervertebral disc degeneration, lumbar region 01/29/2015  . Spinal stenosis, lumbar region without neurogenic claudication 11/22/2014  . Radiculopathy, lumbar region 11/22/2014  . Cervical disc disorder 05/10/2009  . Female cystocele 02/27/2009  . Hx of adenomatous colonic polyps 12/20/2008  . Celiac disease 07/12/2006  . Left bundle-branch block 05/20/2006  . Chronic cough 02/15/2006  . Screening for colon cancer 04/17/2005  . Unspecified asthma, uncomplicated 88/28/0034  . Incomplete uterovaginal prolapse 03/04/2004  . Primary osteoarthritis, right hand 02/28/2002  . Carpal tunnel syndrome, bilateral 02/28/2002  . Trigger thumb, unspecified thumb 02/28/2002  . Rhinitis, allergic 02/24/2002  . Essential hypertension 07/20/2000   Past Medical History:  Diagnosis Date  . Abnormal glucose 03/06/2020  . Age-related osteoporosis without current pathological fracture 11/22/2015   Overview:  Femoral neck  Formatting of this note might be different from the original. Femoral neck  . Allergic rhinitis due to pollen 08/19/2015  . Arthritis   . Carpal tunnel syndrome, bilateral 02/28/2002   Overview:  H/O CTS  . Celiac disease   . Cervical disc disorder 05/10/2009  . 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 Added automatically from request for surgery [phone]  . Chronic pain syndrome 04/01/2015  . Colon polyps   . Essential hypertension 07/20/2000   Overview:  HBP  . Female cystocele 02/27/2009  . GAD (generalized anxiety disorder) 08/19/2015  . GERD (gastroesophageal reflux disease)   . History of Bell's palsy 03/21/2015  . Hx of adenomatous colonic polyps 12/20/2008   Overview:  Colonoscopy 07/2003 - LMD - polyps removed -  recommended repeat in 3 years Colonoscopy 09/2011 - Dr. Marian Sorrow - hyperplastic polyp(s) were removed - repeat in 5 years  . Hypertension    . Incomplete uterovaginal prolapse 03/04/2004  . Insomnia   . Left bundle-branch block 05/20/2006  . Lumbar radiculopathy 01/09/2016   Formatting of this note might be different from the original. Added automatically from request for surgery [phone]  . LVH (left ventricular 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 05/23/2015  . Non-rheumatic mitral regurgitation 03/21/2015  . Osteopenia determined by x-ray 11/22/2015   Formatting of this note might be different from the original. Femoral neck  . Other intervertebral disc degeneration, lumbar region 01/29/2015  . Primary osteoarthritis, right hand 02/28/2002   Overview:  OA HANDS, ESP THUMBS  . Radial tunnel syndrome, right 11/04/2016  . Radiculopathy, lumbar region 11/22/2014   Overview:  Added automatically from request for surgery [phone]  . Rhinitis, allergic 02/24/2002   Overview:  ALLERGY SYMPTOMS - states was seen by allergist in past and informed allergic to dust  . Sacroiliitis ([place]) 08/02/2019  . Screening for colon cancer 04/17/2005   Formatting of this note might be different from the original. Colonoscopy 2005  . Sensorineural hearing loss (SNHL) of both ears 07/05/2019   pt can hear normally  . Short sleeper 04/29/2020  . Snoring 04/29/2020  . Spinal stenosis   . Spinal stenosis, lumbar region without neurogenic claudication 11/22/2014  . Tinnitus aurium, bilateral 07/05/2019  . Trigger thumb, unspecified thumb 02/28/2002   Formatting of this note might be different from the original. TRIGGER THUMBS - started spring 2003. Injected 02/28/02    Past Surgical History:  Procedure Laterality Date  . ABDOMINAL HYSTERECTOMY  2010  . APPENDECTOMY  1969  . CHOLECYSTECTOMY  1969  . SPINAL FUSION  01/2016  . TONSILLECTOMY AND ADENOIDECTOMY  1956    No current facility-administered medications for this encounter.   Current Outpatient Medications  Medication Sig Dispense Refill Last Dose  . amLODipine (NORVASC) 10 MG tablet Take 1 tablet  (10 mg total) by mouth daily. 90 tablet 0   . Ascorbic Acid (VITAMIN C) 1000 MG tablet Take 2,000 mg by mouth in the morning and at bedtime.     Marland Kitchen b complex vitamins capsule Take 1 capsule by mouth daily.     Marland Kitchen BLACK ELDERBERRY PO Take 15 mLs by mouth daily.     . Calcium-Magnesium (CAL-MAG PO) Take 2 tablets by mouth daily.     . Cholecalciferol (VITAMIN D PO) Take 2,000 Units by mouth daily.     Marland Kitchen dexlansoprazole (DEXILANT) 60 MG capsule Take 1 capsule (60 mg total) by mouth daily. 30 capsule 5   . hydrochlorothiazide (HYDRODIURIL) 25 MG tablet Take 25 mg by mouth daily.      . hydrocortisone (ANUSOL-HC) 25 MG suppository Place 1 suppository (25 mg total) rectally 2 (two) times daily as needed for hemorrhoids or anal itching. 12 suppository 3   . Magnesium Salicylate 825 MG TABS Take 325 mg by mouth daily.     . montelukast (SINGULAIR) 10 MG tablet Take 10 mg by mouth at bedtime.     Marland Kitchen OVER THE COUNTER MEDICATION Take 2,000 mg by mouth daily. Pau-D'arco     . Vitamin A 2400 MCG (8000 UT) CAPS Take 8,000 Units by mouth daily.     Marland Kitchen azelastine (ASTELIN) 0.1 % nasal spray Place 2 sprays into both nostrils 2 (two) times daily. Use in each nostril as directed     . LORazepam (  ATIVAN) 0.5 MG tablet Take 0.5 mg by mouth every 8 (eight) hours as needed for anxiety.     Marland Kitchen losartan (COZAAR) 50 MG tablet Take 50 mg by mouth daily.     . MULTIPLE VITAMIN PO Take 1 tablet by mouth daily.      Allergies  Allergen Reactions  . Gluten Meal Diarrhea and Other (See Comments)    Severe diarrhea per member   . Codeine     Chest pains, acid reflux   . Dust Mite Extract     Upper Respiratory issues   . Gabapentin     Eyes went numb and blurry  . Terbinafine Hcl Rash    Social History   Tobacco Use  . Smoking status: Former Smoker    Types: Cigarettes    Quit date: 12/31/1997    Years since quitting: 22.7  . Smokeless tobacco: Never Used  Substance Use Topics  . Alcohol use: Yes    Comment: social  only    Family History  Problem Relation Age of Onset  . Arthritis Mother   . Diabetes Mother   . Pancreatic cancer Mother   . Early death Father   . Leukemia Father   . Early death Sister   . Diabetes Sister      Review of Systems  Constitutional: Negative for chills and fever.  Respiratory: Negative for cough and shortness of breath.   Cardiovascular: Negative for chest pain.  Gastrointestinal: Negative for nausea and vomiting.  Musculoskeletal: Positive for arthralgias.    Objective:  Physical Exam Well nourished and well developed. General: Alert and oriented x3, cooperative and pleasant, no acute distress. Head: normocephalic, atraumatic, neck supple. Eyes: EOMI.  Musculoskeletal: ROM in the patient's left hip is limited due to pain and guarding. Decreased with flexion, abduction, internal rotationand external rotation. Swelling appears to be none. No erythema, no ecchymosis and no abnormal warmth. No signs of infection. No instability noted. Patient is WBAT .  Calves soft and nontender. Motor function intact in LE. Strength 5/5 LE bilaterally. Neuro: Distal pulses 2+. Sensation to light touch intact in LE.  Vital signs in last 24 hours: Weight:  [70.3 kg] 70.3 kg (04/18 1401)  Labs:   Estimated body mass index is 25.4 kg/m as calculated from the following:   Height as of 09/16/20: 5' 5.5" (1.664 m).   Weight as of 09/16/20: 70.3 kg.   Imaging Review Plain radiographs demonstrate severe degenerative joint disease of the left hip(s). The bone quality appears to be adequate for age and reported activity level.  Assessment/Plan:  End stage arthritis, left hip(s)  The patient history, physical examination, clinical judgement of the provider and imaging studies are consistent with end stage degenerative joint disease of the left hip(s) and total hip arthroplasty is deemed medically necessary. The treatment options including medical management, injection therapy,  arthroscopy and arthroplasty were discussed at length. The risks and benefits of total hip arthroplasty were presented and reviewed. The risks due to aseptic loosening, infection, stiffness, dislocation/subluxation,  thromboembolic complications and other imponderables were discussed.  The patient acknowledged the explanation, agreed to proceed with the plan and consent was signed. Patient is being admitted for inpatient treatment for surgery, pain control, PT, OT, prophylactic antibiotics, VTE prophylaxis, progressive ambulation and ADL's and discharge planning.The patient is planning to be discharged home.  Therapy Plans: HEP Disposition: Home with daughter Planned DVT Prophylaxis: aspirin 86m BID DME needed: none PCP: Dr. RGlendale Chard awaiting cardiology clearance Cardiologist: Dr.  Revankar TXA: IV Allergies: codeine - acid reflux (chest & back pain), Celiac disease - no gluten Anesthesia Concerns: none BMI: 26.9 Not diabetic. Other:  - Norco is okay - Hx of LBBB & mitral regurgitation, awaiting Cardio eval   Griffith Citron, PA-C Orthopedic Surgery [facility] [phone]

## 2020-09-16 NOTE — Telephone Encounter (Signed)
Deanna [name] callin gfor [facility] checking the status of the stree test and if the pt is cleared to get her procedure tomorrow.Please advise

## 2020-09-17 ENCOUNTER — Ambulatory Visit (HOSPITAL_COMMUNITY): Payer: MEDICARE

## 2020-09-17 ENCOUNTER — Ambulatory Visit (HOSPITAL_COMMUNITY): Payer: MEDICARE | Admitting: Anesthesiology

## 2020-09-17 ENCOUNTER — Observation Stay (HOSPITAL_COMMUNITY)
Admission: RE | Admit: 2020-09-17 | Discharge: 2020-09-18 | Disposition: A | Discharge status: Home / Self Care | Payer: MEDICARE | Attending: Orthopedic Surgery | Admitting: Orthopedic Surgery

## 2020-09-17 ENCOUNTER — Encounter (HOSPITAL_COMMUNITY)
Admission: RE | Disposition: A | Discharge status: Home / Self Care | Payer: Self-pay | Source: Home / Self Care | Attending: Orthopedic Surgery

## 2020-09-17 ENCOUNTER — Encounter (HOSPITAL_COMMUNITY): Payer: Self-pay | Admitting: Orthopedic Surgery

## 2020-09-17 DIAGNOSIS — Z96649 Presence of unspecified artificial hip joint: Secondary | ICD-10-CM

## 2020-09-17 DIAGNOSIS — Z79899 Other long term (current) drug therapy: Secondary | ICD-10-CM | POA: Diagnosis not present

## 2020-09-17 DIAGNOSIS — M1612 Unilateral primary osteoarthritis, left hip: Principal | ICD-10-CM | POA: Insufficient documentation

## 2020-09-17 DIAGNOSIS — I10 Essential (primary) hypertension: Secondary | ICD-10-CM | POA: Diagnosis not present

## 2020-09-17 DIAGNOSIS — Z419 Encounter for procedure for purposes other than remedying health state, unspecified: Secondary | ICD-10-CM

## 2020-09-17 DIAGNOSIS — Z87891 Personal history of nicotine dependence: Secondary | ICD-10-CM | POA: Insufficient documentation

## 2020-09-17 HISTORY — DX: Presence of unspecified artificial hip joint: Z96.649

## 2020-09-17 HISTORY — PX: TOTAL HIP ARTHROPLASTY: SHX124

## 2020-09-17 LAB — TYPE AND SCREEN
ABO/RH(D): A POS
Antibody Screen: NEGATIVE

## 2020-09-17 LAB — ABO/RH: ABO/RH(D): A POS

## 2020-09-17 SURGERY — ARTHROPLASTY, HIP, TOTAL, ANTERIOR APPROACH
Anesthesia: General | Site: Hip | Laterality: Left

## 2020-09-17 MED ORDER — DOCUSATE SODIUM 100 MG PO CAPS
100.0000 mg | ORAL_CAPSULE | Freq: Two times a day (BID) | ORAL | Status: DC
  Administered 2020-09-17 – 2020-09-18 (×2): 100 mg via ORAL
  Filled 2020-09-17 (×2): qty 1

## 2020-09-17 MED ORDER — FENTANYL CITRATE (PF) 100 MCG/2ML IJ SOLN
25.0000 ug | INTRAMUSCULAR | Status: DC | PRN
  Administered 2020-09-17 (×3): 50 ug via INTRAVENOUS

## 2020-09-17 MED ORDER — HYDROMORPHONE HCL 1 MG/ML IJ SOLN
INTRAMUSCULAR | Status: DC | PRN
  Administered 2020-09-17: .5 mg via INTRAVENOUS
  Administered 2020-09-17: 1 mg via INTRAVENOUS
  Administered 2020-09-17: .5 mg via INTRAVENOUS

## 2020-09-17 MED ORDER — METHOCARBAMOL 500 MG PO TABS
500.0000 mg | ORAL_TABLET | Freq: Four times a day (QID) | ORAL | Status: DC | PRN
  Administered 2020-09-17: 500 mg via ORAL
  Filled 2020-09-17: qty 1

## 2020-09-17 MED ORDER — PROPOFOL 10 MG/ML IV BOLUS
INTRAVENOUS | Status: DC | PRN
  Administered 2020-09-17: 100 mg via INTRAVENOUS
  Administered 2020-09-17 (×3): 20 mg via INTRAVENOUS

## 2020-09-17 MED ORDER — CARBOXYMETHYLCELLUL-GLYCERIN 1-0.25 % OP SOLN: 1.0000 [drp] | Freq: Every day | OPHTHALMIC | Status: DC

## 2020-09-17 MED ORDER — METOCLOPRAMIDE HCL 5 MG/ML IJ SOLN: 5.0000 mg | Freq: Three times a day (TID) | INTRAMUSCULAR | Status: DC | PRN

## 2020-09-17 MED ORDER — ACETAMINOPHEN 325 MG PO TABS: 325.0000 mg | ORAL_TABLET | ORAL | Status: DC | PRN

## 2020-09-17 MED ORDER — SODIUM CHLORIDE 0.9 % IR SOLN
Status: DC | PRN
  Administered 2020-09-17: 1000 mL

## 2020-09-17 MED ORDER — HYDROMORPHONE HCL 2 MG/ML IJ SOLN
INTRAMUSCULAR | Status: AC
  Filled 2020-09-17: qty 1

## 2020-09-17 MED ORDER — MIDAZOLAM HCL 5 MG/5ML IJ SOLN
INTRAMUSCULAR | Status: DC | PRN
  Administered 2020-09-17: 2 mg via INTRAVENOUS

## 2020-09-17 MED ORDER — MIDAZOLAM HCL 2 MG/2ML IJ SOLN
INTRAMUSCULAR | Status: AC
  Filled 2020-09-17: qty 2

## 2020-09-17 MED ORDER — PANTOPRAZOLE SODIUM 40 MG PO TBEC: 40.0000 mg | DELAYED_RELEASE_TABLET | Freq: Every day | ORAL | Status: DC

## 2020-09-17 MED ORDER — OXYCODONE HCL 5 MG/5ML PO SOLN: 5.0000 mg | Freq: Once | ORAL | Status: DC | PRN

## 2020-09-17 MED ORDER — PHENOL 1.4 % MT LIQD: 1.0000 | OROMUCOSAL | Status: DC | PRN

## 2020-09-17 MED ORDER — METOCLOPRAMIDE HCL 5 MG PO TABS: 5.0000 mg | ORAL_TABLET | Freq: Three times a day (TID) | ORAL | Status: DC | PRN

## 2020-09-17 MED ORDER — PHENYLEPHRINE HCL (PRESSORS) 10 MG/ML IV SOLN
INTRAVENOUS | Status: AC
  Filled 2020-09-17: qty 1

## 2020-09-17 MED ORDER — ONDANSETRON HCL 4 MG/2ML IJ SOLN: 4.0000 mg | Freq: Once | INTRAMUSCULAR | Status: DC | PRN

## 2020-09-17 MED ORDER — MEPERIDINE HCL 50 MG/ML IJ SOLN: 6.2500 mg | INTRAMUSCULAR | Status: DC | PRN

## 2020-09-17 MED ORDER — CHLORHEXIDINE GLUCONATE 0.12 % MT SOLN
15.0000 mL | Freq: Once | OROMUCOSAL | Status: AC
Start: 2020-09-17 — End: 2020-09-17

## 2020-09-17 MED ORDER — LIDOCAINE 2% (20 MG/ML) 5 ML SYRINGE
INTRAMUSCULAR | Status: AC
  Filled 2020-09-17: qty 5

## 2020-09-17 MED ORDER — FENTANYL CITRATE (PF) 250 MCG/5ML IJ SOLN
INTRAMUSCULAR | Status: AC
  Filled 2020-09-17: qty 5

## 2020-09-17 MED ORDER — METHOCARBAMOL 500 MG IVPB - SIMPLE MED
500.0000 mg | Freq: Four times a day (QID) | INTRAVENOUS | Status: DC | PRN
  Filled 2020-09-17: qty 50

## 2020-09-17 MED ORDER — HYDROCORTISONE ACETATE 25 MG RE SUPP: 25.0000 mg | Freq: Two times a day (BID) | RECTAL | Status: DC | PRN

## 2020-09-17 MED ORDER — LIDOCAINE 2% (20 MG/ML) 5 ML SYRINGE
INTRAMUSCULAR | Status: DC | PRN
  Administered 2020-09-17: 75 mg via INTRAVENOUS

## 2020-09-17 MED ORDER — PANTOPRAZOLE SODIUM 40 MG PO TBEC
40.0000 mg | DELAYED_RELEASE_TABLET | Freq: Every day | ORAL | Status: DC
  Administered 2020-09-17 – 2020-09-18 (×2): 40 mg via ORAL
  Filled 2020-09-17 (×2): qty 1

## 2020-09-17 MED ORDER — ONDANSETRON HCL 4 MG/2ML IJ SOLN
4.0000 mg | Freq: Four times a day (QID) | INTRAMUSCULAR | Status: DC | PRN
  Filled 2020-09-17: qty 2

## 2020-09-17 MED ORDER — AMLODIPINE BESYLATE 10 MG PO TABS
10.0000 mg | ORAL_TABLET | Freq: Every day | ORAL | Status: DC
  Administered 2020-09-18: 10 mg via ORAL
  Filled 2020-09-17: qty 1

## 2020-09-17 MED ORDER — FENTANYL CITRATE (PF) 100 MCG/2ML IJ SOLN
INTRAMUSCULAR | Status: AC
  Filled 2020-09-17: qty 4

## 2020-09-17 MED ORDER — ORAL CARE MOUTH RINSE
15.0000 mL | Freq: Once | OROMUCOSAL | Status: AC
  Administered 2020-09-17: 15 mL via OROMUCOSAL

## 2020-09-17 MED ORDER — SUGAMMADEX SODIUM 200 MG/2ML IV SOLN
INTRAVENOUS | Status: DC | PRN
  Administered 2020-09-17: 200 mg via INTRAVENOUS

## 2020-09-17 MED ORDER — POVIDONE-IODINE 10 % EX SWAB
2.0000 "application " | Freq: Once | CUTANEOUS | Status: AC
  Administered 2020-09-17: 2 via TOPICAL

## 2020-09-17 MED ORDER — POLYETHYLENE GLYCOL 3350 17 G PO PACK: 17.0000 g | PACK | Freq: Every day | ORAL | Status: DC | PRN

## 2020-09-17 MED ORDER — MORPHINE SULFATE (PF) 4 MG/ML IV SOLN: 0.5000 mg | INTRAVENOUS | Status: DC | PRN

## 2020-09-17 MED ORDER — DEXAMETHASONE SODIUM PHOSPHATE 10 MG/ML IJ SOLN
8.0000 mg | Freq: Once | INTRAMUSCULAR | Status: AC
  Administered 2020-09-17: 10 mg via INTRAVENOUS

## 2020-09-17 MED ORDER — CEFAZOLIN SODIUM-DEXTROSE 2-4 GM/100ML-% IV SOLN
2.0000 g | INTRAVENOUS | Status: AC
  Administered 2020-09-17: 2 g via INTRAVENOUS
  Filled 2020-09-17: qty 100

## 2020-09-17 MED ORDER — HYDROCODONE-ACETAMINOPHEN 5-325 MG PO TABS
1.0000 | ORAL_TABLET | ORAL | Status: DC | PRN
  Administered 2020-09-17: 2 via ORAL
  Administered 2020-09-17: 1 via ORAL
  Administered 2020-09-18: 2 via ORAL
  Filled 2020-09-17 (×3): qty 2
  Filled 2020-09-17: qty 1
  Filled 2020-09-17: qty 2

## 2020-09-17 MED ORDER — DIPHENHYDRAMINE HCL 12.5 MG/5ML PO ELIX: 12.5000 mg | ORAL_SOLUTION | ORAL | Status: DC | PRN

## 2020-09-17 MED ORDER — ONDANSETRON HCL 4 MG/2ML IJ SOLN
INTRAMUSCULAR | Status: DC | PRN
  Administered 2020-09-17: 4 mg via INTRAVENOUS

## 2020-09-17 MED ORDER — TRANEXAMIC ACID-NACL 1000-0.7 MG/100ML-% IV SOLN
1000.0000 mg | Freq: Once | INTRAVENOUS | Status: AC
  Administered 2020-09-17: 1000 mg via INTRAVENOUS
  Filled 2020-09-17: qty 100

## 2020-09-17 MED ORDER — CEFAZOLIN SODIUM-DEXTROSE 2-4 GM/100ML-% IV SOLN
2.0000 g | Freq: Four times a day (QID) | INTRAVENOUS | Status: AC
  Administered 2020-09-17 (×2): 2 g via INTRAVENOUS
  Filled 2020-09-17 (×2): qty 100

## 2020-09-17 MED ORDER — ROCURONIUM BROMIDE 10 MG/ML (PF) SYRINGE
PREFILLED_SYRINGE | INTRAVENOUS | Status: DC | PRN
  Administered 2020-09-17: 60 mg via INTRAVENOUS

## 2020-09-17 MED ORDER — ROCURONIUM BROMIDE 10 MG/ML (PF) SYRINGE
PREFILLED_SYRINGE | INTRAVENOUS | Status: AC
  Filled 2020-09-17: qty 10

## 2020-09-17 MED ORDER — FENTANYL CITRATE (PF) 100 MCG/2ML IJ SOLN
INTRAMUSCULAR | Status: DC | PRN
  Administered 2020-09-17 (×5): 50 ug via INTRAVENOUS

## 2020-09-17 MED ORDER — FERROUS SULFATE 325 (65 FE) MG PO TABS
325.0000 mg | ORAL_TABLET | Freq: Three times a day (TID) | ORAL | Status: DC
  Administered 2020-09-17 – 2020-09-18 (×3): 325 mg via ORAL
  Filled 2020-09-17 (×3): qty 1

## 2020-09-17 MED ORDER — ONDANSETRON HCL 4 MG PO TABS: 4.0000 mg | ORAL_TABLET | Freq: Four times a day (QID) | ORAL | Status: DC | PRN

## 2020-09-17 MED ORDER — OXYCODONE HCL 5 MG PO TABS
5.0000 mg | ORAL_TABLET | Freq: Once | ORAL | Status: DC | PRN
Start: 2020-09-17 — End: 2020-09-17

## 2020-09-17 MED ORDER — MONTELUKAST SODIUM 10 MG PO TABS
10.0000 mg | ORAL_TABLET | Freq: Every day | ORAL | Status: DC
  Administered 2020-09-17: 10 mg via ORAL
  Filled 2020-09-17: qty 1

## 2020-09-17 MED ORDER — PROPOFOL 10 MG/ML IV BOLUS
INTRAVENOUS | Status: AC
  Filled 2020-09-17: qty 20

## 2020-09-17 MED ORDER — MENTHOL 3 MG MT LOZG: 1.0000 | LOZENGE | OROMUCOSAL | Status: DC | PRN

## 2020-09-17 MED ORDER — LOSARTAN POTASSIUM 50 MG PO TABS
50.0000 mg | ORAL_TABLET | Freq: Every day | ORAL | Status: DC
  Administered 2020-09-17 – 2020-09-18 (×2): 50 mg via ORAL
  Filled 2020-09-17 (×2): qty 1

## 2020-09-17 MED ORDER — BISACODYL 10 MG RE SUPP: 10.0000 mg | Freq: Every day | RECTAL | Status: DC | PRN

## 2020-09-17 MED ORDER — LACTATED RINGERS IV SOLN: INTRAVENOUS | Status: DC

## 2020-09-17 MED ORDER — ACETAMINOPHEN 325 MG PO TABS: 325.0000 mg | ORAL_TABLET | Freq: Four times a day (QID) | ORAL | Status: DC | PRN

## 2020-09-17 MED ORDER — TRANEXAMIC ACID-NACL 1000-0.7 MG/100ML-% IV SOLN
1000.0000 mg | INTRAVENOUS | Status: AC
  Administered 2020-09-17: 1000 mg via INTRAVENOUS
  Filled 2020-09-17: qty 100

## 2020-09-17 MED ORDER — ONDANSETRON HCL 4 MG/2ML IJ SOLN
INTRAMUSCULAR | Status: AC
  Filled 2020-09-17: qty 2

## 2020-09-17 MED ORDER — PHENYLEPHRINE HCL-NACL 10-0.9 MG/250ML-% IV SOLN
INTRAVENOUS | Status: DC | PRN
  Administered 2020-09-17: 15 ug/min via INTRAVENOUS

## 2020-09-17 MED ORDER — HYDROCODONE-ACETAMINOPHEN 7.5-325 MG PO TABS
1.0000 | ORAL_TABLET | ORAL | Status: DC | PRN
  Administered 2020-09-18 (×2): 2 via ORAL
  Filled 2020-09-17 (×3): qty 2

## 2020-09-17 MED ORDER — DEXAMETHASONE SODIUM PHOSPHATE 10 MG/ML IJ SOLN
10.0000 mg | Freq: Once | INTRAMUSCULAR | Status: AC
  Administered 2020-09-18: 10 mg via INTRAVENOUS
  Filled 2020-09-17: qty 1

## 2020-09-17 MED ORDER — SODIUM CHLORIDE 0.9 % IV SOLN: INTRAVENOUS | Status: DC

## 2020-09-17 MED ORDER — ACETAMINOPHEN 160 MG/5ML PO SOLN: 325.0000 mg | ORAL | Status: DC | PRN

## 2020-09-17 MED ORDER — DEXAMETHASONE SODIUM PHOSPHATE 10 MG/ML IJ SOLN
INTRAMUSCULAR | Status: AC
  Filled 2020-09-17: qty 1

## 2020-09-17 MED ORDER — ASPIRIN 81 MG PO CHEW
81.0000 mg | CHEWABLE_TABLET | Freq: Two times a day (BID) | ORAL | Status: DC
  Administered 2020-09-17 – 2020-09-18 (×2): 81 mg via ORAL
  Filled 2020-09-17 (×2): qty 1

## 2020-09-17 MED ORDER — HYDROCHLOROTHIAZIDE 25 MG PO TABS
25.0000 mg | ORAL_TABLET | Freq: Every day | ORAL | Status: DC
  Administered 2020-09-18: 25 mg via ORAL
  Filled 2020-09-17: qty 1

## 2020-09-17 SURGICAL SUPPLY — 41 items
BAG DECANTER FOR FLEXI CONT (MISCELLANEOUS) IMPLANT
BAG ZIPLOCK 12X15 (MISCELLANEOUS) IMPLANT
BALL HIP CERAMIC (Hips) ×1 IMPLANT
BLADE SAG 18X100X1.27 (BLADE) ×2 IMPLANT
COVER PERINEAL POST (MISCELLANEOUS) ×2 IMPLANT
COVER SURGICAL LIGHT HANDLE (MISCELLANEOUS) ×2 IMPLANT
COVER WAND RF STERILE (DRAPES) IMPLANT
CUP ACET PINNACLE SECTR 50MM (Hips) ×1 IMPLANT
DERMABOND ADVANCED (GAUZE/BANDAGES/DRESSINGS) ×1
DERMABOND ADVANCED .7 DNX12 (GAUZE/BANDAGES/DRESSINGS) ×1 IMPLANT
DRAPE STERI IOBAN 125X83 (DRAPES) ×2 IMPLANT
DRAPE U-SHAPE 47X51 STRL (DRAPES) ×4 IMPLANT
DRESSING AQUACEL AG SP 3.5X10 (GAUZE/BANDAGES/DRESSINGS) ×1 IMPLANT
DRSG AQUACEL AG SP 3.5X10 (GAUZE/BANDAGES/DRESSINGS) ×2
DURAPREP 26ML APPLICATOR (WOUND CARE) ×2 IMPLANT
ELECT REM PT RETURN 15FT ADLT (MISCELLANEOUS) ×2 IMPLANT
ELIMINATOR HOLE APEX DEPUY (Hips) ×2 IMPLANT
GLOVE ORTHO TXT STRL SZ7.5 (GLOVE) ×4 IMPLANT
GLOVE SURG ENC MOIS LTX SZ6 (GLOVE) ×4 IMPLANT
GLOVE SURG UNDER POLY LF SZ6.5 (GLOVE) ×2 IMPLANT
GLOVE SURG UNDER POLY LF SZ7.5 (GLOVE) ×2 IMPLANT
GOWN STRL REUS W/TWL LRG LVL3 (GOWN DISPOSABLE) ×4 IMPLANT
HIP BALL CERAMIC (Hips) ×2 IMPLANT
HOLDER FOLEY CATH W/STRAP (MISCELLANEOUS) IMPLANT
KIT TURNOVER KIT A (KITS) ×2 IMPLANT
LINER ACET PNNCL PLUS4 NEUTRAL (Hips) ×1 IMPLANT
PACK ANTERIOR HIP CUSTOM (KITS) ×2 IMPLANT
PENCIL SMOKE EVACUATOR (MISCELLANEOUS) ×2 IMPLANT
PINNACLE PLUS 4 NEUTRAL (Hips) ×2 IMPLANT
PINNACLE SECTOR CUP 50MM (Hips) ×2 IMPLANT
SCREW 6.5MMX25MM (Screw) ×2 IMPLANT
STEM FEM ACTIS STD SZ4 (Stem) ×2 IMPLANT
SUT MNCRL AB 4-0 PS2 18 (SUTURE) ×2 IMPLANT
SUT STRATAFIX 0 PDS 27 VIOLET (SUTURE) ×2
SUT VIC AB 1 CT1 36 (SUTURE) ×6 IMPLANT
SUT VIC AB 2-0 CT1 27 (SUTURE) ×3
SUT VIC AB 2-0 CT1 TAPERPNT 27 (SUTURE) ×3 IMPLANT
SUTURE STRATFX 0 PDS 27 VIOLET (SUTURE) ×1 IMPLANT
TRAY FOLEY MTR SLVR 16FR STAT (SET/KITS/TRAYS/PACK) IMPLANT
TUBE SUCTION HIGH CAP CLEAR NV (SUCTIONS) ×2 IMPLANT
WATER STERILE IRR 1000ML POUR (IV SOLUTION) ×2 IMPLANT

## 2020-09-17 NOTE — Op Note (Signed)
NAME:  Deanna [name]                ACCOUNT NO.: [id number]      MEDICAL RECORD NO.: [id number]      FACILITY:  [facility]      PHYSICIAN:  Mauri Pole  DATE OF BIRTH:  Apr 02, 1946     DATE OF PROCEDURE:  09/17/2020                                 OPERATIVE REPORT         PREOPERATIVE DIAGNOSIS: Left  hip osteoarthritis.      POSTOPERATIVE DIAGNOSIS:  Left hip osteoarthritis.      PROCEDURE:  Left total hip replacement through an anterior approach   utilizing DePuy THR system, component size 50 mm pinnacle cup, a size 32+4 neutral   Altrex liner, a size 4 standard Actis stem with a 32+5 delta ceramic   ball.      SURGEON:  Pietro Cassis. Alvan Dame, M.D.      ASSISTANT:  Griffith Citron, PA-C     ANESTHESIA:  Regional.      SPECIMENS:  None.      COMPLICATIONS:  None.      BLOOD LOSS:  350 cc     DRAINS:  None.      INDICATION OF THE PROCEDURE:  Deanna [name] is a 75 y.o. female who had   presented to office for evaluation of left hip pain.  Radiographs revealed   progressive degenerative changes with bone-on-bone   articulation of the  hip joint, including subchondral cystic changes and osteophytes.  The patient had painful limited range of   motion significantly affecting their overall quality of life and function.  The patient was failing to    respond to conservative measures including medications and/or injections and activity modification and at this point was ready   to proceed with more definitive measures.  Consent was obtained for   benefit of pain relief.  Specific risks of infection, DVT, component   failure, dislocation, neurovascular injury, and need for revision surgery were reviewed in the office as well discussion of   the anterior versus posterior approach were reviewed.     PROCEDURE IN DETAIL:  The patient was brought to operative theater.   Once adequate anesthesia, preoperative antibiotics, 2 gm of Ancef, 1 gm of Tranexamic Acid, and 10  mg of Decadron were administered, the patient was positioned supine on the [employer] table.  Once the patient was safely positioned with adequate padding of boney prominences we predraped out the hip, and used fluoroscopy to confirm orientation of the pelvis.      The left hip was then prepped and draped from proximal iliac crest to   mid thigh with a shower curtain technique.      Time-out was performed identifying the patient, planned procedure, and the appropriate extremity.     An incision was then made 2 cm lateral to the   anterior superior iliac spine extending over the orientation of the   tensor fascia lata muscle and sharp dissection was carried down to the   fascia of the muscle.      The fascia was then incised.  The muscle belly was identified and swept   laterally and retractor placed along the superior neck.  Following   cauterization of the circumflex vessels and removing some pericapsular  fat, a second cobra retractor was placed on the inferior neck.  A T-capsulotomy was made along the line of the   superior neck to the trochanteric fossa, then extended proximally and   distally.  Tag sutures were placed and the retractors were then placed   intracapsular.  We then identified the trochanteric fossa and   orientation of my neck cut and then made a neck osteotomy with the femur on traction.  The femoral   head was removed without difficulty or complication.  Traction was let   off and retractors were placed posterior and anterior around the   acetabulum.      The labrum and foveal tissue were debrided.  I began reaming with a 44 mm   reamer and reamed up to 49 mm reamer with good bony bed preparation and a 50 mm  cup was chosen.  The final 50 mm Pinnacle cup was then impacted under fluoroscopy to confirm the depth of penetration and orientation with respect to   Abduction and forward flexion.  A screw was placed into the ilium followed by the hole eliminator.  The final    32+4 neutral Altrex liner was impacted with good visualized rim fit.  The cup was positioned anatomically within the acetabular portion of the pelvis.      At this point, the femur was rolled to 100 degrees.  Further capsule was   released off the inferior aspect of the femoral neck.  I then   released the superior capsule proximally.  With the leg in a neutral position the hook was placed laterally   along the femur under the vastus lateralis origin and elevated manually and then held in position using the hook attachment on the bed.  The leg was then extended and adducted with the leg rolled to 100   degrees of external rotation.  Retractors were placed along the medial calcar and posteriorly over the greater trochanter.  Once the proximal femur was fully   exposed, I used a box osteotome to set orientation.  I then began   broaching with the starting chili pepper broach and passed this by hand and then broached up to 4.  With the 4 broach in place I chose a standard versus high offset neck and did several trial reductions.  The offset was appropriate, leg lengths   appeared to be equal best matched with the +5 head ball trial confirmed radiographically.   Given these findings, I went ahead and dislocated the hip, repositioned all   retractors and positioned the right hip in the extended and abducted position.  The final 4 standard Actis stem was   chosen and it was impacted down to the level of neck cut.  Based on this   and the trial reductions, a final 32+5 delta ceramic ball was chosen and   impacted onto a clean and dry trunnion, and the hip was reduced.  The   hip had been irrigated throughout the case again at this point.  I did   reapproximate the superior capsular leaflet to the anterior leaflet   using #1 Vicryl.  The fascia of the   tensor fascia lata muscle was then reapproximated using #1 Vicryl and #0 Stratafix sutures.  The   remaining wound was closed with 2-0 Vicryl and running  4-0 Monocryl.   The hip was cleaned, dried, and dressed sterilely using Dermabond and   Aquacel dressing.  The patient was then brought   to recovery  room in stable condition tolerating the procedure well.    Griffith Citron, PA-C was present for the entirety of the case involved from   preoperative positioning, perioperative retractor management, general   facilitation of the case, as well as primary wound closure as assistant.            Pietro Cassis Alvan Dame, M.D.        09/17/2020 9:36 AM

## 2020-09-17 NOTE — Anesthesia Procedure Notes (Signed)
Procedure Name: Intubation Date/Time: 09/17/2020 9:42 AM Performed by: Lissa Morales, CRNA Pre-anesthesia Checklist: Patient identified, Emergency Drugs available, Suction available and Patient being monitored Patient Re-evaluated:Patient Re-evaluated prior to induction Oxygen Delivery Method: Circle system utilized Preoxygenation: Pre-oxygenation with 100% oxygen Induction Type: IV induction Ventilation: Mask ventilation without difficulty Laryngoscope Size: Mac and 4 Grade View: Grade II Tube type: Oral Tube size: 7.0 mm Number of attempts: 1 Airway Equipment and Method: Stylet and Oral airway Placement Confirmation: ETT inserted through vocal cords under direct vision,  positive ETCO2 and breath sounds checked- equal and bilateral Secured at: 21 cm Tube secured with: Tape Dental Injury: Teeth and Oropharynx as per pre-operative assessment

## 2020-09-17 NOTE — Discharge Instructions (Signed)
INSTRUCTIONS AFTER JOINT REPLACEMENT   o Remove items at home which could result in a fall. This includes throw rugs or furniture in walking pathways o ICE to the affected joint every three hours while awake for 30 minutes at a time, for at least the first 3-5 days, and then as needed for pain and swelling.  Continue to use ice for pain and swelling. You may notice swelling that will progress down to the foot and ankle.  This is normal after surgery.  Elevate your leg when you are not up walking on it.   o Continue to use the breathing machine you got in the hospital (incentive spirometer) which will help keep your temperature down.  It is common for your temperature to cycle up and down following surgery, especially at night when you are not up moving around and exerting yourself.  The breathing machine keeps your lungs expanded and your temperature down.   DIET:  As you were doing prior to hospitalization, we recommend a well-balanced diet.  DRESSING / WOUND CARE / SHOWERING  Keep the surgical dressing until follow up.  The dressing is water proof, so you can shower without any extra covering.  IF THE DRESSING FALLS OFF or the wound gets wet inside, change the dressing with sterile gauze.  Please use good hand washing techniques before changing the dressing.  Do not use any lotions or creams on the incision until instructed by your surgeon.    ACTIVITY  o Increase activity slowly as tolerated, but follow the weight bearing instructions below.   o No driving for 6 weeks or until further direction given by your physician.  You cannot drive while taking narcotics.  o No lifting or carrying greater than 10 lbs. until further directed by your surgeon. o Avoid periods of inactivity such as sitting longer than an hour when not asleep. This helps prevent blood clots.  o You may return to work once you are authorized by your doctor.     WEIGHT BEARING   Weight bearing as tolerated with assist  device (walker, cane, etc) as directed, use it as long as suggested by your surgeon or therapist, typically at least 4-6 weeks.   EXERCISES  Results after joint replacement surgery are often greatly improved when you follow the exercise, range of motion and muscle strengthening exercises prescribed by your doctor. Safety measures are also important to protect the joint from further injury. Any time any of these exercises cause you to have increased pain or swelling, decrease what you are doing until you are comfortable again and then slowly increase them. If you have problems or questions, call your caregiver or physical therapist for advice.   Rehabilitation is important following a joint replacement. After just a few days of immobilization, the muscles of the leg can become weakened and shrink (atrophy).  These exercises are designed to build up the tone and strength of the thigh and leg muscles and to improve motion. Often times heat used for twenty to thirty minutes before working out will loosen up your tissues and help with improving the range of motion but do not use heat for the first two weeks following surgery (sometimes heat can increase post-operative swelling).   These exercises can be done on a training (exercise) mat, on the floor, on a table or on a bed. Use whatever works the best and is most comfortable for you.    Use music or television while you are exercising so that  the exercises are a pleasant break in your day. This will make your life better with the exercises acting as a break in your routine that you can look forward to.   Perform all exercises about fifteen times, three times per day or as directed.  You should exercise both the operative leg and the other leg as well.  Exercises include:   . Quad Sets - Tighten up the muscle on the front of the thigh (Quad) and hold for 5-10 seconds.   . Straight Leg Raises - With your knee straight (if you were given a brace, keep it on),  lift the leg to 60 degrees, hold for 3 seconds, and slowly lower the leg.  Perform this exercise against resistance later as your leg gets stronger.  . Leg Slides: Lying on your back, slowly slide your foot toward your buttocks, bending your knee up off the floor (only go as far as is comfortable). Then slowly slide your foot back down until your leg is flat on the floor again.  [place] Haring Wings: Lying on your back spread your legs to the side as far apart as you can without causing discomfort.  . Hamstring Strength:  Lying on your back, push your heel against the floor with your leg straight by tightening up the muscles of your buttocks.  Repeat, but this time bend your knee to a comfortable angle, and push your heel against the floor.  You may put a pillow under the heel to make it more comfortable if necessary.   A rehabilitation program following joint replacement surgery can speed recovery and prevent re-injury in the future due to weakened muscles. Contact your doctor or a physical therapist for more information on knee rehabilitation.    CONSTIPATION  Constipation is defined medically as fewer than three stools per week and severe constipation as less than one stool per week.  Even if you have a regular bowel pattern at home, your normal regimen is likely to be disrupted due to multiple reasons following surgery.  Combination of anesthesia, postoperative narcotics, change in appetite and fluid intake all can affect your bowels.   YOU MUST use at least one of the following options; they are listed in order of increasing strength to get the job done.  They are all available over the counter, and you may need to use some, POSSIBLY even all of these options:    Drink plenty of fluids (prune juice may be helpful) and high fiber foods Colace 100 mg by mouth twice a day  Senokot for constipation as directed and as needed Dulcolax (bisacodyl), take with full glass of water  Miralax (polyethylene glycol)  once or twice a day as needed.  If you have tried all these things and are unable to have a bowel movement in the first 3-4 days after surgery call either your surgeon or your primary doctor.    If you experience loose stools or diarrhea, hold the medications until you stool forms back up.  If your symptoms do not get better within 1 week or if they get worse, check with your doctor.  If you experience "the worst abdominal pain ever" or develop nausea or vomiting, please contact the office immediately for further recommendations for treatment.   ITCHING:  If you experience itching with your medications, try taking only a single pain pill, or even half a pain pill at a time.  You can also use Benadryl over the counter for itching or also to  help with sleep.   TED HOSE STOCKINGS:  Use stockings on both legs until for at least 2 weeks or as directed by physician office. They may be removed at night for sleeping.  MEDICATIONS:  See your medication summary on the "After Visit Summary" that nursing will review with you.  You may have some home medications which will be placed on hold until you complete the course of blood thinner medication.  It is important for you to complete the blood thinner medication as prescribed.  PRECAUTIONS:  If you experience chest pain or shortness of breath - call 911 immediately for transfer to the hospital emergency department.   If you develop a fever greater that 101 F, purulent drainage from wound, increased redness or drainage from wound, foul odor from the wound/dressing, or calf pain - CONTACT YOUR SURGEON.                                                   FOLLOW-UP APPOINTMENTS:  If you do not already have a post-op appointment, please call the office for an appointment to be seen by your surgeon.  Guidelines for how soon to be seen are listed in your "After Visit Summary", but are typically between 1-4 weeks after surgery.  OTHER INSTRUCTIONS:   Knee  Replacement:  Do not place pillow under knee, focus on keeping the knee straight while resting. CPM instructions: 0-90 degrees, 2 hours in the morning, 2 hours in the afternoon, and 2 hours in the evening. Place foam block, curve side up under heel at all times except when in CPM or when walking.  DO NOT modify, tear, cut, or change the foam block in any way.  POST-OPERATIVE OPIOID TAPER INSTRUCTIONS: . It is important to wean off of your opioid medication as soon as possible. If you do not need pain medication after your surgery it is ok to stop day one. Marland Kitchen Opioids include: o Codeine, Hydrocodone(Norco, Vicodin), Oxycodone(Percocet, oxycontin) and hydromorphone amongst others.  . Long term and even short term use of opiods can cause: o Increased pain response o Dependence o Constipation o Depression o Respiratory depression o And more.  . Withdrawal symptoms can include o Flu like symptoms o Nausea, vomiting o And more . Techniques to manage these symptoms o Hydrate well o Eat regular healthy meals o Stay active o Use relaxation techniques(deep breathing, meditating, yoga) . Do Not substitute Alcohol to help with tapering . If you have been on opioids for less than two weeks and do not have pain than it is ok to stop all together.  . Plan to wean off of opioids o This plan should start within one week post op of your joint replacement. o Maintain the same interval or time between taking each dose and first decrease the dose.  o Cut the total daily intake of opioids by one tablet each day o Next start to increase the time between doses. o The last dose that should be eliminated is the evening dose.     MAKE SURE YOU:  . Understand these instructions.  . Get help right away if you are not doing well or get worse.    Thank you for letting us be a part of your medical care team.  It is a privilege we respect greatly.  We hope these instructions will  help you stay on track for a fast  and full recovery!

## 2020-09-17 NOTE — Progress Notes (Signed)
Chaplain engaged in an initial visit with Deanna [name], who was resting peacefully, and her two daughters.  Chaplain introduced herself and her role, and offered support to them.    Chaplain will follow-up as needed.     09/17/20 1500  Clinical Encounter Type  Visited With Patient and family together  Visit Type Initial

## 2020-09-17 NOTE — Transfer of Care (Signed)
Immediate Anesthesia Transfer of Care Note  Patient: Deanna [name]  Procedure(s) Performed: TOTAL HIP ARTHROPLASTY ANTERIOR APPROACH (Left Hip)  Patient Location: PACU  Anesthesia Type:General  Level of Consciousness: awake, alert , oriented and patient cooperative  Airway & Oxygen Therapy: Patient Spontanous Breathing and Patient connected to face mask oxygen  Post-op Assessment: Report given to RN, Post -op Vital signs reviewed and stable and Patient moving all extremities X 4  Post vital signs: stable  Last Vitals:  Vitals Value Taken Time  BP 149/64 09/17/20 1130  Temp    Pulse 92 09/17/20 1132  Resp 19 09/17/20 1132  SpO2 100 % 09/17/20 1132  Vitals shown include unvalidated device data.  Last Pain:  Vitals:   09/17/20 0757  TempSrc: Oral         Complications: No complications documented.

## 2020-09-17 NOTE — Evaluation (Signed)
Physical Therapy Evaluation Patient Details Name: Deanna [name] MRN: [id number] DOB: [birth year] Today's Date: 09/17/2020   History of Present Illness  Patient is 75 y.o. female s/p Lt THA anterior approach on 09/17/20 with PMH significant for OA, LBBB, HTN, GERD, anxiety, celiac disease.    Clinical Impression  Deanna [name] is a 75 y.o. female POD 0 s/p Lt THA. Patient reports independence with use of SPC for mobility due to pain. Patient is now limited by functional impairments (see PT problem list below) and requires min-mod assist for transfers with RW. She was limited this session by pain but was able to complete stand step/pivot transfer with mod assist to use BSC and then sit in recliner. Patient will benefit from continued skilled PT interventions to address impairments and progress towards PLOF. Acute PT will follow to progress mobility and stair training in preparation for safe discharge home.     Follow Up Recommendations Follow surgeon's recommendation for DC plan and follow-up therapies;Home health PT    Equipment Recommendations  3in1 (PT)    Recommendations for Other Services       Precautions / Restrictions Precautions Precautions: Fall Restrictions Weight Bearing Restrictions: No Other Position/Activity Restrictions: WBAT      Mobility  Bed Mobility Overal bed mobility: Needs Assistance Bed Mobility: Supine to Sit     Supine to sit: Mod assist;HOB elevated     General bed mobility comments: pt requires extra time encouragement, and assist for Lt LE to move to EOB. Mod cues for sequencing and use of bed rail and to pivot fully to EOB.    Transfers Overall transfer level: Needs assistance Equipment used: Rolling walker (2 wheeled) Transfers: Sit to/from [facility] Sit to Stand: Min assist;From elevated surface Stand pivot transfers: Mod assist;From elevated surface       General transfer comment: Min assist with cues for hand placement  for safe power up from EOB and BSC. Mod assist to sequence and steady for small side steps to move Bed>BSC>recliner.  Ambulation/Gait                Stairs            Wheelchair Mobility    Modified Rankin (Stroke Patients Only)       Balance Overall balance assessment: Needs assistance Sitting-balance support: Feet supported Sitting balance-Leahy Scale: Good     Standing balance support: During functional activity;Bilateral upper extremity supported Standing balance-Leahy Scale: Poor                               Pertinent Vitals/Pain Pain Assessment: Faces Faces Pain Scale: Hurts even more Pain Location: Lt hip Pain Descriptors / Indicators: Aching;Discomfort;Grimacing;Guarding Pain Intervention(s): Limited activity within patient's tolerance;Monitored during session;Repositioned;Ice applied;Premedicated before session    Home Living Family/patient expects to be discharged to:: Private residence Living Arrangements: Alone Available Help at Discharge: Family (daughter will stay until Saturday) Type of Home: House Home Access: Stairs to enter Entrance Stairs-Rails: None Entrance Stairs-Number of Steps: 1 Home Layout: One level Home Equipment: Shower seat - built in;Walker - 2 wheels;Toilet riser      Prior Function Level of Independence: Independent         Comments: uses cane occasionally due to pain.     Hand Dominance   Dominant Hand: Right    Extremity/Trunk Assessment   Upper Extremity Assessment Upper Extremity Assessment: Overall WFL for tasks assessed    Lower  Extremity Assessment Lower Extremity Assessment: Generalized weakness    Cervical / Trunk Assessment Cervical / Trunk Assessment: Normal  Communication   Communication: No difficulties  Cognition Arousal/Alertness: Awake/alert Behavior During Therapy: WFL for tasks assessed/performed Overall Cognitive Status: Within Functional Limits for tasks assessed                                         General Comments      Exercises     Assessment/Plan    PT Assessment Patient needs continued PT services  PT Problem List Decreased strength;Decreased activity tolerance;Decreased range of motion;Decreased balance;Decreased mobility;Decreased knowledge of use of DME;Decreased knowledge of precautions;Pain;Obesity       PT Treatment Interventions DME instruction;Gait training;Stair training;Therapeutic activities;Therapeutic exercise;Functional mobility training;Balance training;Patient/family education    PT Goals (Current goals can be found in the Care Plan section)  Acute Rehab PT Goals Patient Stated Goal: stop hurting PT Goal Formulation: With patient Time For Goal Achievement: 09/24/20 Potential to Achieve Goals: Good    Frequency 7X/week   Barriers to discharge        Co-evaluation               AM-PAC PT "6 Clicks" Mobility  Outcome Measure Help needed turning from your back to your side while in a flat bed without using bedrails?: None Help needed moving from lying on your back to sitting on the side of a flat bed without using bedrails?: A Little Help needed moving to and from a bed to a chair (including a wheelchair)?: A Little Help needed standing up from a chair using your arms (e.g., wheelchair or bedside chair)?: A Little Help needed to walk in hospital room?: A Little Help needed climbing 3-5 steps with a railing? : A Little 6 Click Score: 19    End of Session Equipment Utilized During Treatment: Gait belt Activity Tolerance: Patient tolerated treatment well Patient left: in chair;with call bell/phone within reach;with chair alarm set;with family/visitor present Nurse Communication: Mobility status PT Visit Diagnosis: Muscle weakness (generalized) (M62.81);Difficulty in walking, not elsewhere classified (R26.2)    Time: 3837-7939 PT Time Calculation (min) (ACUTE ONLY): 44 min   Charges:   PT  Evaluation $PT Eval Low Complexity: 1 Low PT Treatments $Therapeutic Activity: 23-37 mins        Verner Mould, DPT Acute Rehabilitation Services Office [phone] Pager [phone]    Jacques Navy 09/17/2020, 6:40 PM

## 2020-09-17 NOTE — Interval H&P Note (Signed)
History and Physical Interval Note:  09/17/2020 8:31 AM  Deanna [name]  has presented today for surgery, with the diagnosis of Left hip osteoarthritis.  The various methods of treatment have been discussed with the patient and family. After consideration of risks, benefits and other options for treatment, the patient has consented to  Procedure(s) with comments: TOTAL HIP ARTHROPLASTY ANTERIOR APPROACH (Left) - 70 mins as a surgical intervention.  The patient's history has been reviewed, patient examined, no change in status, stable for surgery.  I have reviewed the patient's chart and labs.  Questions were answered to the patient's satisfaction.     Mauri Pole

## 2020-09-17 NOTE — Plan of Care (Signed)
Discussed with patient and daughters about plan of care for post-op day 0. Teaching will continue as this patient is drowsy after anesthesia and PACU pain medications.   Will continue to monitor patient.    [place], Therapist, sports

## 2020-09-17 NOTE — Anesthesia Preprocedure Evaluation (Addendum)
Anesthesia Evaluation  Patient identified by MRN, date of birth, ID band Patient awake    Reviewed: Allergy & Precautions, H&P , NPO status , Patient's Chart, lab work & pertinent test results, reviewed documented beta blocker date and time   Airway Mallampati: I  TM Distance: >3 FB Neck ROM: full    Dental no notable dental hx. (+) Teeth Intact, Caps, Dental Advisory Given   Pulmonary former smoker,    Pulmonary exam normal breath sounds clear to auscultation       Cardiovascular Exercise Tolerance: Good hypertension, Pt. on medications + dysrhythmias  Rhythm:regular Rate:Normal  ECHO 4/22 Left Ventricle: Left ventricular ejection fraction, by estimation, is 60  to 65%. The left ventricle has normal function. The left ventricle has no  regional wall motion abnormalities. The left ventricular internal cavity  size was normal in size. There is  no left ventricular hypertrophy. Left ventricular diastolic parameters  are consistent with Grade I diastolic dysfunction (impaired relaxation).    Neuro/Psych PSYCHIATRIC DISORDERS Anxiety    GI/Hepatic GERD  Medicated and Controlled,  Endo/Other    Renal/GU   negative genitourinary   Musculoskeletal  (+) Arthritis , Osteoarthritis,    Abdominal   Peds  Hematology   Anesthesia Other Findings   Reproductive/Obstetrics negative OB ROS                            Anesthesia Physical Anesthesia Plan  ASA: III  Anesthesia Plan: General   Post-op Pain Management:    Induction: Intravenous  PONV Risk Score and Plan: 3 and Ondansetron, Dexamethasone and Treatment may vary due to age or medical condition  Airway Management Planned: Oral ETT  Additional Equipment: None  Intra-op Plan:   Post-operative Plan: Extubation in OR  Informed Consent: I have reviewed the patients History and Physical, chart, labs and discussed the procedure including  the risks, benefits and alternatives for the proposed anesthesia with the patient or authorized representative who has indicated his/her understanding and acceptance.     Dental Advisory Given  Plan Discussed with: CRNA and Anesthesiologist  Anesthesia Plan Comments: (  )        Anesthesia Quick Evaluation

## 2020-09-18 ENCOUNTER — Encounter (HOSPITAL_COMMUNITY): Payer: Self-pay | Admitting: Orthopedic Surgery

## 2020-09-18 DIAGNOSIS — M1612 Unilateral primary osteoarthritis, left hip: Secondary | ICD-10-CM | POA: Diagnosis not present

## 2020-09-18 LAB — CBC
HCT: 33.3 % — ABNORMAL LOW (ref 36.0–46.0)
Hemoglobin: 10.9 g/dL — ABNORMAL LOW (ref 12.0–15.0)
MCH: 27.6 pg (ref 26.0–34.0)
MCHC: 32.7 g/dL (ref 30.0–36.0)
MCV: 84.3 fL (ref 80.0–100.0)
Platelets: 202 10*3/uL (ref 150–400)
RBC: 3.95 MIL/uL (ref 3.87–5.11)
RDW: 13.6 % (ref 11.5–15.5)
WBC: 10.9 10*3/uL — ABNORMAL HIGH (ref 4.0–10.5)
nRBC: 0 % (ref 0.0–0.2)

## 2020-09-18 LAB — BASIC METABOLIC PANEL
Anion gap: 7 (ref 5–15)
BUN: 15 mg/dL (ref 8–23)
CO2: 26 mmol/L (ref 22–32)
Calcium: 8.9 mg/dL (ref 8.9–10.3)
Chloride: 104 mmol/L (ref 98–111)
Creatinine, Ser: 0.46 mg/dL (ref 0.44–1.00)
GFR, Estimated: >60 mL/min (ref >60)
Glucose, Bld: 138 mg/dL — ABNORMAL HIGH (ref 70–99)
Potassium: 4.1 mmol/L (ref 3.5–5.1)
Sodium: 137 mmol/L (ref 135–145)

## 2020-09-18 MED ORDER — ASPIRIN 81 MG PO CHEW: 81.0000 mg | CHEWABLE_TABLET | Freq: Two times a day (BID) | ORAL | 0 refills | Status: AC

## 2020-09-18 MED ORDER — HYDROCODONE-ACETAMINOPHEN 7.5-325 MG PO TABS: 1.0000 | ORAL_TABLET | Freq: Four times a day (QID) | ORAL | 0 refills | Status: DC | PRN

## 2020-09-18 MED ORDER — POLYETHYLENE GLYCOL 3350 17 G PO PACK: 17.0000 g | PACK | Freq: Every day | ORAL | 0 refills | Status: DC | PRN

## 2020-09-18 MED ORDER — DOCUSATE SODIUM 100 MG PO CAPS: 100.0000 mg | ORAL_CAPSULE | Freq: Two times a day (BID) | ORAL | 0 refills | Status: DC

## 2020-09-18 MED ORDER — METHOCARBAMOL 500 MG PO TABS: 500.0000 mg | ORAL_TABLET | Freq: Four times a day (QID) | ORAL | 0 refills | Status: DC | PRN

## 2020-09-18 NOTE — TOC Transition Note (Signed)
Transition of Care [facility]) - CM/SW Discharge Note  Patient Details  Name: Deanna [name] MRN: [id number] Date of Birth: 1945-09-04  [facility] CM/SW Contact:  Sherie Don, LCSW Phone Number: 09/18/2020, 11:06 AM  Clinical Narrative: Patient expected to discharge today after working with PT. CSW met with patient to review discharge plan. Per patient, the plan was to discharge home with an HEP, but she would like to discharge home with HHPT. Patient has a rolling walker and raised toilet seat at home, so there are no DME needs.  CSW contacted the PA, who is agreeable to HHPT. Orders have been placed. CSW made referral to [facility] with [facility] for HHPT. Referral accepted. HH added to AVS. CSW updated patient. TOC signing off.  Final next level of care: [place] Barriers to Discharge: No Barriers Identified  Patient Goals and CMS Choice Patient states their goals for this hospitalization and ongoing recovery are:: Discharge home with [place] CMS Medicare.gov Compare Post Acute Care list provided to:: Patient Choice offered to / list presented to : Patient  Discharge Plan and Services        DME Arranged: N/A DME Agency: NA HH Arranged: PT [place] Agency: [place] ([facility]) Date [place]: 09/18/20 Time [place]: 0413 Representative spoke with at [place]: Ramond Marrow  Readmission Risk Interventions No flowsheet data found.

## 2020-09-18 NOTE — Progress Notes (Signed)
Physical Therapy Treatment Patient Details Name: Deanna [name] MRN: [id number] DOB: 11/21/1945 Today's Date: 09/18/2020    History of Present Illness Patient is 75 y.o. female s/p Lt THA anterior approach on 09/17/20 with PMH significant for OA, LBBB, HTN, GERD, anxiety, celiac disease.    PT Comments    Progressing with mobility. Pt is ready for d/c with family assist from PT standpoint.  Verbally reviewed up/down one step with pt and dtr (if pt decides to Get in her bed at home whnch has one step d/t bed ht)  Follow Up Recommendations  Follow surgeon's recommendation for DC plan and follow-up therapies;Home health PT     Equipment Recommendations  3in1 (PT)    Recommendations for Other Services       Precautions / Restrictions Precautions Precautions: Fall Restrictions LLE Weight Bearing: Weight bearing as tolerated    Mobility  Bed Mobility Overal bed mobility: Needs Assistance Bed Mobility: Sit to Supine       Sit to supine: Min assist   General bed mobility comments: assist with surgical LE    Transfers Overall transfer level: Needs assistance Equipment used: Rolling walker (2 wheeled) Transfers: Sit to/from Stand Sit to Stand: Supervision         General transfer comment: cues for hand placement and LLE position  Ambulation/Gait Ambulation/Gait assistance: Min guard;Supervision Gait Distance (Feet): 68 Feet Assistive device: Rolling walker (2 wheeled) Gait Pattern/deviations: Step-through pattern;Step-to pattern;Decreased stride length;Decreased stance time - left     General Gait Details: cues for sequence, RW position and overall safety. good stability with walker support   Stairs             Wheelchair Mobility    Modified Rankin (Stroke Patients Only)       Balance                                            Cognition Arousal/Alertness: Awake/alert Behavior During Therapy: WFL for tasks  assessed/performed Overall Cognitive Status: Within Functional Limits for tasks assessed                                        Exercises Total Joint Exercises Ankle Circles/Pumps: AROM;Both;5 reps Quad Sets: AROM;Left;5 reps Heel Slides: AAROM;Left;10 reps    General Comments        Pertinent Vitals/Pain Pain Assessment: Faces Faces Pain Scale: Hurts even more Pain Location: Lt hip Pain Descriptors / Indicators: Aching;Discomfort;Grimacing;Guarding Pain Intervention(s): Limited activity within patient's tolerance;Monitored during session;Premedicated before session;Repositioned    Home Living                      Prior Function            PT Goals (current goals can now be found in the care plan section) Acute Rehab PT Goals Patient Stated Goal: stop hurting PT Goal Formulation: With patient Time For Goal Achievement: 09/24/20 Potential to Achieve Goals: Good Progress towards PT goals: Progressing toward goals    Frequency    7X/week      PT Plan Current plan remains appropriate    Co-evaluation              AM-PAC PT "6 Clicks" Mobility   Outcome Measure  Help needed turning from your  back to your side while in a flat bed without using bedrails?: None Help needed moving from lying on your back to sitting on the side of a flat bed without using bedrails?: A Little Help needed moving to and from a bed to a chair (including a wheelchair)?: A Little Help needed standing up from a chair using your arms (e.g., wheelchair or bedside chair)?: A Little Help needed to walk in hospital room?: A Little Help needed climbing 3-5 steps with a railing? : A Little 6 Click Score: 19    End of Session Equipment Utilized During Treatment: Gait belt Activity Tolerance: Patient tolerated treatment well Patient left: in chair;with call bell/phone within reach;with chair alarm set;with family/visitor present Nurse Communication: Mobility  status PT Visit Diagnosis: Muscle weakness (generalized) (M62.81);Difficulty in walking, not elsewhere classified (R26.2)     Time: 8921-1941 PT Time Calculation (min) (ACUTE ONLY): 28 min  Charges:  $Gait Training: 8-22 mins $Therapeutic Activity: 8-22 mins                     Baxter Flattery, PT  Acute Rehab Dept ([place]) [phone] Pager [phone]  09/18/2020    [facility] 09/18/2020, 4:36 PM

## 2020-09-18 NOTE — Progress Notes (Signed)
Physical Therapy Treatment Patient Details Name: Deanna [name] MRN: [id number] DOB: [birth year] Today's Date: 09/18/2020    History of Present Illness Patient is 75 y.o. female s/p Lt THA anterior approach on 09/17/20 with PMH significant for OA, LBBB, HTN, GERD, anxiety, celiac disease.    PT Comments    progressing with mobility. Will see for second session and should be ready to d/c later today with family assist   Follow Up Recommendations  Follow surgeon's recommendation for DC plan and follow-up therapies;Home health PT     Equipment Recommendations  3in1 (PT)    Recommendations for Other Services       Precautions / Restrictions Precautions Precautions: Fall Restrictions Weight Bearing Restrictions: Yes LLE Weight Bearing: Weight bearing as tolerated    Mobility  Bed Mobility               General bed mobility comments: in recliner    Transfers Overall transfer level: Needs assistance Equipment used: Rolling walker (2 wheeled) Transfers: Sit to/from Stand Sit to Stand: Min guard;Supervision         General transfer comment: cues for hand placement and LLE position  Ambulation/Gait Ambulation/Gait assistance: Min guard Gait Distance (Feet): 60 Feet (10') Assistive device: Rolling walker (2 wheeled) Gait Pattern/deviations: Step-through pattern;Step-to pattern;Decreased stride length;Decreased stance time - left     General Gait Details: cues for sequence, RW position and overall safety   Stairs             Wheelchair Mobility    Modified Rankin (Stroke Patients Only)       Balance                                            Cognition Arousal/Alertness: Awake/alert Behavior During Therapy: WFL for tasks assessed/performed Overall Cognitive Status: Within Functional Limits for tasks assessed                                        Exercises      General Comments        Pertinent  Vitals/Pain Pain Assessment: Faces Faces Pain Scale: Hurts even more Pain Location: Lt hip Pain Descriptors / Indicators: Aching;Discomfort;Grimacing;Guarding    Home Living                      Prior Function            PT Goals (current goals can now be found in the care plan section) Acute Rehab PT Goals Patient Stated Goal: stop hurting PT Goal Formulation: With patient Time For Goal Achievement: 09/24/20 Potential to Achieve Goals: Good Progress towards PT goals: Progressing toward goals    Frequency    7X/week      PT Plan Current plan remains appropriate    Co-evaluation              AM-PAC PT "6 Clicks" Mobility   Outcome Measure  Help needed turning from your back to your side while in a flat bed without using bedrails?: None Help needed moving from lying on your back to sitting on the side of a flat bed without using bedrails?: A Little Help needed moving to and from a bed to a chair (including a wheelchair)?: A Little Help needed standing up  from a chair using your arms (e.g., wheelchair or bedside chair)?: A Little Help needed to walk in hospital room?: A Little Help needed climbing 3-5 steps with a railing? : A Little 6 Click Score: 19    End of Session Equipment Utilized During Treatment: Gait belt Activity Tolerance: Patient tolerated treatment well Patient left: in chair;with call bell/phone within reach;with chair alarm set;with family/visitor present Nurse Communication: Mobility status PT Visit Diagnosis: Muscle weakness (generalized) (M62.81);Difficulty in walking, not elsewhere classified (R26.2)     Time: 6606-3016 PT Time Calculation (min) (ACUTE ONLY): 24 min  Charges:  $Gait Training: 23-37 mins                     Baxter Flattery, PT  Acute Rehab Dept ([place]) [phone] Pager [phone]  09/18/2020    [facility] 09/18/2020, 11:51 AM

## 2020-09-18 NOTE — Progress Notes (Signed)
Subjective: 1 Day Post-Op Procedure(s) (LRB): TOTAL HIP ARTHROPLASTY ANTERIOR APPROACH (Left) Patient reports pain as mild.   Patient seen in rounds for Dr. Alvan Dame. Patient is resting in bed on exam this morning. She was limited in PT yesterday secondary to pain. Foley catheter removed. She is motivated to get home today. She denies CP, SHOB, N/V.  We will continue therapy today.   Objective: Vital signs in last 24 hours: Temp:  [97.6 F (36.4 C)-98.3 F (36.8 C)] 97.6 F (36.4 C) (04/20 0536) Pulse Rate:  [89-101] 90 (04/20 0536) Resp:  [10-18] 18 (04/20 0536) BP: (117-157)/(60-76) 125/73 (04/20 0536) SpO2:  [93 %-100 %] 98 % (04/20 0536)  Intake/Output from previous day:  Intake/Output Summary (Last 24 hours) at 09/18/2020 0855 Last data filed at 09/18/2020 0745 Gross per 24 hour  Intake 3718.53 ml  Output 1100 ml  Net 2618.53 ml     Intake/Output this shift: Total I/O In: -  Out: 200 [Urine:200]  Labs: Recent Labs    09/16/20 1354 09/18/20 0259  HGB 14.0 10.9*   Recent Labs    09/16/20 1354 09/18/20 0259  WBC 6.6 10.9*  RBC 5.09 3.95  HCT 42.9 33.3*  PLT 243 202   Recent Labs    09/16/20 1354 09/18/20 0259  NA 142 137  K 3.7 4.1  CL 107 104  CO2 26 26  BUN 16 15  CREATININE 0.46 0.46  GLUCOSE 113* 138*  CALCIUM 10.2 8.9   Recent Labs    09/16/20 1354  INR 1.1    Exam: General - Patient is Alert and Oriented Extremity - Neurologically intact Sensation intact distally Intact pulses distally Dorsiflexion/Plantar flexion intact Dressing - dressing C/D/I Motor Function - intact, moving foot and toes well on exam.   Past Medical History:  Diagnosis Date  . Abnormal glucose 03/06/2020  . Age-related osteoporosis without current pathological fracture 11/22/2015   Overview:  Femoral neck  Formatting of this note might be different from the original. Femoral neck  . Allergic rhinitis due to pollen 08/19/2015  . Arthritis   . Carpal tunnel  syndrome, bilateral 02/28/2002   Overview:  H/O CTS  . Celiac disease   . Cervical disc disorder 05/10/2009  . 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 Added automatically from request for surgery [id number]  . Chronic pain syndrome 04/01/2015  . Colon polyps   . Essential hypertension 07/20/2000   Overview:  HBP  . Female cystocele 02/27/2009  . GAD (generalized anxiety disorder) 08/19/2015  . GERD (gastroesophageal reflux disease)   . History of Bell's palsy 03/21/2015  . Hx of adenomatous colonic polyps 12/20/2008   Overview:  Colonoscopy 07/2003 - LMD - polyps removed - recommended repeat in 3 years Colonoscopy 09/2011 - Dr. Marian Sorrow - hyperplastic polyp(s) were removed - repeat in 5 years  . Hypertension   . Incomplete uterovaginal prolapse 03/04/2004  . Insomnia   . Left bundle-branch block 05/20/2006  . Lumbar radiculopathy 01/09/2016   Formatting of this note might be different from the original. Added automatically from request for surgery [id number]  . LVH (left ventricular 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 05/23/2015  . Non-rheumatic mitral regurgitation 03/21/2015  . Osteopenia determined by x-ray 11/22/2015   Formatting of this note might be different from the original. Femoral neck  . Other intervertebral disc degeneration, lumbar region 01/29/2015  . Primary osteoarthritis, right hand 02/28/2002   Overview:  OA HANDS, ESP THUMBS  . Radial tunnel syndrome, right 11/04/2016  . Radiculopathy, lumbar region 11/22/2014   Overview:  Added automatically from request for surgery [id number]  . Rhinitis, allergic 02/24/2002   Overview:  ALLERGY SYMPTOMS - states was seen by allergist in past and informed allergic to dust  . Sacroiliitis ([place]) 08/02/2019  . Screening for colon cancer 04/17/2005   Formatting of this note might be different from the original. Colonoscopy 2005  . Sensorineural hearing loss (SNHL)  of both ears 07/05/2019   pt can hear normally  . Short sleeper 04/29/2020  . Snoring 04/29/2020  . Spinal stenosis   . Spinal stenosis, lumbar region without neurogenic claudication 11/22/2014  . Tinnitus aurium, bilateral 07/05/2019  . Trigger thumb, unspecified thumb 02/28/2002   Formatting of this note might be different from the original. TRIGGER THUMBS - started spring 2003. Injected 02/28/02    Assessment/Plan: 1 Day Post-Op Procedure(s) (LRB): TOTAL HIP ARTHROPLASTY ANTERIOR APPROACH (Left) Active Problems:   S/P left total hip arthroplasty  Estimated body mass index is 25.4 kg/m as calculated from the following:   Height as of this encounter: 5' 5.5" (1.664 m).   Weight as of this encounter: 70.3 kg. Advance diet Up with therapy D/C IV fluids  DVT Prophylaxis - Aspirin Weight bearing as tolerated.  Plan is to go Home after hospital stay. Plan for discharge today after 1-2 sessions of therapy as long as she is meeting her goals. Follow up in the office in 2 weeks.   Griffith Citron, PA-C Orthopedic Surgery [phone] 09/18/2020, 8:55 AM

## 2020-09-18 NOTE — Anesthesia Postprocedure Evaluation (Signed)
Anesthesia Post Note  Patient: Deanna [name]  Procedure(s) Performed: TOTAL HIP ARTHROPLASTY ANTERIOR APPROACH (Left Hip)     Patient location during evaluation: PACU Anesthesia Type: General Level of consciousness: awake and alert Pain management: pain level controlled Vital Signs Assessment: post-procedure vital signs reviewed and stable Respiratory status: spontaneous breathing, nonlabored ventilation, respiratory function stable and patient connected to nasal cannula oxygen Cardiovascular status: blood pressure returned to baseline and stable Postop Assessment: no apparent nausea or vomiting Anesthetic complications: no   No complications documented.  Last Vitals:  Vitals:   09/18/20 0216 09/18/20 0536  BP: 117/63 125/73  Pulse: 94 90  Resp: 17 18  Temp: 36.7 C 36.4 C  SpO2: 100% 98%    Last Pain:  Vitals:   09/18/20 0726  TempSrc:   PainSc: 4                  Shaylynn Nulty

## 2020-09-19 ENCOUNTER — Ambulatory Visit: Payer: MEDICARE | Admitting: Psychology

## 2020-09-23 NOTE — Discharge Summary (Signed)
Physician Discharge Summary   Patient ID: Deanna [name] MRN: [id number] DOB/AGE: 1945/10/19 75 y.o.  Admit date: 09/17/2020 Discharge date: 09/18/2020  Primary Diagnosis: Left hip osteoarthritis.   Admission Diagnoses:  Past Medical History:  Diagnosis Date  . Abnormal glucose 03/06/2020  . Age-related osteoporosis without current pathological fracture 11/22/2015   Overview:  Femoral neck  Formatting of this note might be different from the original. Femoral neck  . Allergic rhinitis due to pollen 08/19/2015  . Arthritis   . Carpal tunnel syndrome, bilateral 02/28/2002   Overview:  H/O CTS  . Celiac disease   . Cervical disc disorder 05/10/2009  . 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 Added automatically from request for surgery [phone]  . Chronic pain syndrome 04/01/2015  . Colon polyps   . Essential hypertension 07/20/2000   Overview:  HBP  . Female cystocele 02/27/2009  . GAD (generalized anxiety disorder) 08/19/2015  . GERD (gastroesophageal reflux disease)   . History of Bell's palsy 03/21/2015  . Hx of adenomatous colonic polyps 12/20/2008   Overview:  Colonoscopy 07/2003 - LMD - polyps removed - recommended repeat in 3 years Colonoscopy 09/2011 - Dr. Marian Sorrow - hyperplastic polyp(s) were removed - repeat in 5 years  . Hypertension   . Incomplete uterovaginal prolapse 03/04/2004  . Insomnia   . Left bundle-branch block 05/20/2006  . Lumbar radiculopathy 01/09/2016   Formatting of this note might be different from the original. Added automatically from request for surgery [phone]  . LVH (left ventricular 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 05/23/2015  . Non-rheumatic mitral regurgitation 03/21/2015  . Osteopenia determined by x-ray 11/22/2015   Formatting of this note might be different from the original. Femoral neck  . Other intervertebral disc degeneration, lumbar region 01/29/2015  .  Primary osteoarthritis, right hand 02/28/2002   Overview:  OA HANDS, ESP THUMBS  . Radial tunnel syndrome, right 11/04/2016  . Radiculopathy, lumbar region 11/22/2014   Overview:  Added automatically from request for surgery [phone]  . Rhinitis, allergic 02/24/2002   Overview:  ALLERGY SYMPTOMS - states was seen by allergist in past and informed allergic to dust  . Sacroiliitis ([place]) 08/02/2019  . Screening for colon cancer 04/17/2005   Formatting of this note might be different from the original. Colonoscopy 2005  . Sensorineural hearing loss (SNHL) of both ears 07/05/2019   pt can hear normally  . Short sleeper 04/29/2020  . Snoring 04/29/2020  . Spinal stenosis   . Spinal stenosis, lumbar region without neurogenic claudication 11/22/2014  . Tinnitus aurium, bilateral 07/05/2019  . Trigger thumb, unspecified thumb 02/28/2002   Formatting of this note might be different from the original. TRIGGER THUMBS - started spring 2003. Injected 02/28/02   Discharge Diagnoses:   Active Problems:   S/P left total hip arthroplasty  Estimated body mass index is 25.4 kg/m as calculated from the following:   Height as of this encounter: 5' 5.5" (1.664 m).   Weight as of this encounter: 70.3 kg.  Procedure:  Procedure(s) (LRB): TOTAL HIP ARTHROPLASTY ANTERIOR APPROACH (Left)   Consults: None  HPI: Deanna [name] is a 76 y.o. female who had   presented to office for evaluation of left hip pain.  Radiographs revealed   progressive degenerative changes with bone-on-bone   articulation of the  hip joint, including subchondral cystic changes and osteophytes.  The patient had painful limited range of   motion significantly  affecting their overall quality of life and function.  The patient was failing to    respond to conservative measures including medications and/or injections and activity modification and at this point was ready   to proceed with more definitive measures.  Consent was obtained for   benefit  of pain relief.  Specific risks of infection, DVT, component   failure, dislocation, neurovascular injury, and need for revision surgery were reviewed in the office as well discussion of   the anterior versus posterior approach were reviewed.  Laboratory Data: Admission on 09/17/2020, Discharged on 09/18/2020  Component Date Value Ref Range Status  . ABO/RH(D) 09/17/2020    Final                   Value:A POS Performed at [facility], [place] [address], [place], [place] [address]   . WBC 09/18/2020 10.9* 4.0 - 10.5 K/uL Final  . RBC 09/18/2020 3.95  3.87 - 5.11 MIL/uL Final  . Hemoglobin 09/18/2020 10.9* 12.0 - 15.0 g/dL Final  . HCT 09/18/2020 33.3* 36.0 - 46.0 % Final  . MCV 09/18/2020 84.3  80.0 - 100.0 fL Final  . MCH 09/18/2020 27.6  26.0 - 34.0 pg Final  . MCHC 09/18/2020 32.7  30.0 - 36.0 g/dL Final  . RDW 09/18/2020 13.6  11.5 - 15.5 % Final  . Platelets 09/18/2020 202  150 - 400 K/uL Final  . nRBC 09/18/2020 0.0  0.0 - 0.2 % Final   Performed at [facility], [place] [address], [place], [place] [address]  . Sodium 09/18/2020 137  135 - 145 mmol/L Final  . Potassium 09/18/2020 4.1  3.5 - 5.1 mmol/L Final  . Chloride 09/18/2020 104  98 - 111 mmol/L Final  . CO2 09/18/2020 26  22 - 32 mmol/L Final  . Glucose, Bld 09/18/2020 138* 70 - 99 mg/dL Final   Glucose reference range applies only to samples taken after fasting for at least 8 hours.  . BUN 09/18/2020 15  8 - 23 mg/dL Final  . Creatinine, Ser 09/18/2020 0.46  0.44 - 1.00 mg/dL Final  . Calcium 09/18/2020 8.9  8.9 - 10.3 mg/dL Final  . GFR, Estimated 09/18/2020 >60  >60 mL/min Final   Comment: (NOTE) Calculated using the CKD-EPI Creatinine Equation (2021)   . Anion gap 09/18/2020 7  5 - 15 Final   Performed at [facility], [place] [address] [place], [place] [address]  Hospital Outpatient Visit on 09/16/2020  Component Date Value Ref Range Status  . WBC 09/16/2020 6.6   4.0 - 10.5 K/uL Final  . RBC 09/16/2020 5.09  3.87 - 5.11 MIL/uL Final  . Hemoglobin 09/16/2020 14.0  12.0 - 15.0 g/dL Final  . HCT 09/16/2020 42.9  36.0 - 46.0 % Final  . MCV 09/16/2020 84.3  80.0 - 100.0 fL Final  . MCH 09/16/2020 27.5  26.0 - 34.0 pg Final  . MCHC 09/16/2020 32.6  30.0 - 36.0 g/dL Final  . RDW 09/16/2020 13.5  11.5 - 15.5 % Final  . Platelets 09/16/2020 243  150 - 400 K/uL Final  . nRBC 09/16/2020 0.0  0.0 - 0.2 % Final   Performed at [facility], [place] [address], [place], [place] [address]  . Sodium 09/16/2020 142  135 - 145 mmol/L Final  . Potassium 09/16/2020 3.7  3.5 - 5.1 mmol/L Final  . Chloride 09/16/2020 107  98 - 111 mmol/L Final  . CO2 09/16/2020  26  22 - 32 mmol/L Final  . Glucose, Bld 09/16/2020 113* 70 - 99 mg/dL Final   Glucose reference range applies only to samples taken after fasting for at least 8 hours.  . BUN 09/16/2020 16  8 - 23 mg/dL Final  . Creatinine, Ser 09/16/2020 0.46  0.44 - 1.00 mg/dL Final  . Calcium 09/16/2020 10.2  8.9 - 10.3 mg/dL Final  . Total Protein 09/16/2020 7.9  6.5 - 8.1 g/dL Final  . Albumin 09/16/2020 4.5  3.5 - 5.0 g/dL Final  . AST 09/16/2020 22  15 - 41 U/L Final  . ALT 09/16/2020 19  0 - 44 U/L Final  . Alkaline Phosphatase 09/16/2020 72  38 - 126 U/L Final  . Total Bilirubin 09/16/2020 0.6  0.3 - 1.2 mg/dL Final  . GFR, Estimated 09/16/2020 >60  >60 mL/min Final   Comment: (NOTE) Calculated using the CKD-EPI Creatinine Equation (2021)   . Anion gap 09/16/2020 9  5 - 15 Final   Performed at [facility], [place] [address] [place], [place] [address]  . Prothrombin Time 09/16/2020 13.8  11.4 - 15.2 seconds Final  . INR 09/16/2020 1.1  0.8 - 1.2 Final   Comment: (NOTE) INR goal varies based on device and disease states. Performed at [facility], [place] [address] [place], [place] [address]   . aPTT 09/16/2020 26  24 - 36 seconds Final   Performed at [facility], [place] [address], [place], [place] [address]  . ABO/RH(D) 09/16/2020 A POS   Final  . Antibody Screen 09/16/2020 NEG   Final  . Sample Expiration 09/16/2020 09/20/2020,2359   Final  . Extend sample reason 09/16/2020    Final                   Value:NO TRANSFUSIONS OR PREGNANCY IN THE PAST 3 MONTHS Performed at [facility], [place] [address] [place], [place] [address]   . MRSA, PCR 09/16/2020 NEGATIVE  NEGATIVE Final  . Staphylococcus aureus 09/16/2020 NEGATIVE  NEGATIVE Final   Comment: (NOTE) The Xpert SA Assay (FDA approved for NASAL specimens in patients 60 years of age and older), is one component of a comprehensive surveillance program. It is not intended to diagnose infection nor to guide or monitor treatment. Performed at [facility], [place] [address], [place], [place] [address]   Hospital Outpatient Visit on 09/06/2020  Component Date Value Ref Range Status  . S' Lateral 09/06/2020 1.89  cm Final  . Area-P 1/2 09/06/2020 2.56  cm2 Final  . MV M vel 09/06/2020 4.42  m/s Final  . MV Peak grad 09/06/2020 78.1  mmHg Final  Appointment on 09/05/2020  Component Date Value Ref Range Status  . Rest HR 09/13/2020 85  bpm Final  . Rest BP 09/13/2020 167/82  mmHg Final  . Peak HR 09/13/2020 106  bpm Final  . Peak BP 09/13/2020 158/71  mmHg Final  . SSS 09/13/2020 0   Final  . SRS 09/13/2020 0   Final  . SDS 09/13/2020 0   Final  . TID 09/13/2020 1.11   Final  . LV sys vol 09/13/2020 32  mL Final  . LV dias vol 09/13/2020 76  46 - 106 mL Final     X-Rays:DG C-Arm 1-60 Min-No Report  Result Date: 09/17/2020 CLINICAL DATA:  Left hip replacement. EXAM: OPERATIVE LEFT HIP (WITH PELVIS IF PERFORMED) 4 VIEWS TECHNIQUE: Fluoroscopic spot image(s)  were submitted for interpretation post-operatively. COMPARISON:  No prior. FINDINGS: Postsurgical changes of left hip replacement. Visualized hardware intact. Anatomic alignment. Four  views obtained. IMPRESSION: Postsurgical changes of left hip replacement. Electronically Signed   By: Marcello Moores  Register   On: 09/17/2020 12:45   MYOCARDIAL PERFUSION IMAGING  Result Date: 09/13/2020  Nuclear stress EF: 58%.  The left ventricular ejection fraction is normal (55-65%).  There was no ST segment deviation noted during stress.  The study is normal.  This is a low risk study.  No ischemia or infarction on perfusion images. Normal wall motion.  ECHOCARDIOGRAM COMPLETE  Result Date: 09/09/2020    ECHOCARDIOGRAM REPORT   Patient Name:   Jania Chismar Date of Exam: 09/06/2020 Medical Rec #:  [id number]     Height:       62.6 in Accession #:    [id number]    Weight:       151.0 lb Date of Birth:  1946/04/04     BSA:          1.708 m Patient Age:    68 years      BP:           60/130 mmHg Patient Gender: F             HR:           81 bpm. Exam Location:  [place] Procedure: 2D Echo, Cardiac Doppler and Color Doppler Indications:    Preop  History:        Patient has no prior history of Echocardiogram examinations.  Sonographer:    Geradine Girt Referring Phys: Waverly Ferrari [facility] IMPRESSIONS  1. Left ventricular ejection fraction, by estimation, is 60 to 65%. The left ventricle has normal function. The left ventricle has no regional wall motion abnormalities. Left ventricular diastolic parameters are consistent with Grade I diastolic dysfunction (impaired relaxation).  2. Right ventricular systolic function is normal. The right ventricular size is normal. There is normal pulmonary artery systolic pressure.  3. The mitral valve is normal in structure. Mild mitral valve regurgitation. No evidence of mitral stenosis.  4. The aortic valve is normal in structure. Aortic valve regurgitation is not visualized. Mild aortic valve sclerosis is present, with no evidence of aortic valve stenosis.  5. The inferior vena cava is normal in size with greater than 50% respiratory variability, suggesting right atrial  pressure of 3 mmHg. FINDINGS  Left Ventricle: Left ventricular ejection fraction, by estimation, is 60 to 65%. The left ventricle has normal function. The left ventricle has no regional wall motion abnormalities. The left ventricular internal cavity size was normal in size. There is  no left ventricular hypertrophy. Left ventricular diastolic parameters are consistent with Grade I diastolic dysfunction (impaired relaxation). Right Ventricle: The right ventricular size is normal. No increase in right ventricular wall thickness. Right ventricular systolic function is normal. There is normal pulmonary artery systolic pressure. The tricuspid regurgitant velocity is 2.55 m/s, and  with an assumed right atrial pressure of 3 mmHg, the estimated right ventricular systolic pressure is 02.7 mmHg. Left Atrium: Left atrial size was normal in size. Right Atrium: Right atrial size was normal in size. Pericardium: There is no evidence of pericardial effusion. Mitral Valve: The mitral valve is normal in structure. Mild mitral valve regurgitation. No evidence of mitral valve stenosis. Tricuspid Valve: The tricuspid valve is normal in structure. Tricuspid valve regurgitation is trivial. No evidence of tricuspid stenosis. Aortic Valve: The aortic valve is normal  in structure. Aortic valve regurgitation is not visualized. Mild aortic valve sclerosis is present, with no evidence of aortic valve stenosis. Pulmonic Valve: The pulmonic valve was normal in structure. Pulmonic valve regurgitation is not visualized. No evidence of pulmonic stenosis. Aorta: The aortic root is normal in size and structure. Venous: The inferior vena cava is normal in size with greater than 50% respiratory variability, suggesting right atrial pressure of 3 mmHg. IAS/Shunts: No atrial level shunt detected by color flow Doppler.  LEFT VENTRICLE PLAX 2D LVIDd:         3.86 cm  Diastology LVIDs:         1.89 cm  LV e' medial:    4.13 cm/s LV PW:         1.01 cm  LV  E/e' medial:  15.4 LV IVS:        2.23 cm  LV e' lateral:   6.74 cm/s LVOT diam:     1.90 cm  LV E/e' lateral: 9.5 LV SV:         83 LV SV Index:   49 LVOT Area:     2.84 cm  RIGHT VENTRICLE RV S prime:     16.30 cm/s TAPSE (M-mode): 2.6 cm LEFT ATRIUM             Index       RIGHT ATRIUM          Index LA diam:        3.70 cm 2.17 cm/m  RA Area:     6.30 cm LA Vol (A2C):   21.4 ml 12.53 ml/m RA Volume:   9.51 ml  5.57 ml/m LA Vol (A4C):   25.6 ml 14.99 ml/m LA Biplane Vol: 23.7 ml 13.87 ml/m  AORTIC VALVE LVOT Vmax:   131.00 cm/s LVOT Vmean:  101.000 cm/s LVOT VTI:    0.294 m  AORTA Ao Root diam: 2.70 cm Ao Asc diam:  3.00 cm MITRAL VALVE               TRICUSPID VALVE MV Area (PHT): 2.56 cm    TR Peak grad:   26.0 mmHg MV Decel Time: 296 msec    TR Vmax:        255.00 cm/s MR Peak grad: 78.1 mmHg MR Vmax:      442.00 cm/s  SHUNTS MV E velocity: 63.80 cm/s  Systemic VTI:  0.29 m MV A velocity: 86.50 cm/s  Systemic Diam: 1.90 cm MV E/A ratio:  0.74 Jyl Heinz MD Electronically signed by Jyl Heinz MD Signature Date/Time: 09/09/2020/5:32:28 PM    Final    DG HIP OPERATIVE UNILAT W OR W/O PELVIS LEFT  Result Date: 09/17/2020 CLINICAL DATA:  Left hip replacement. EXAM: OPERATIVE LEFT HIP (WITH PELVIS IF PERFORMED) 4 VIEWS TECHNIQUE: Fluoroscopic spot image(s) were submitted for interpretation post-operatively. COMPARISON:  No prior. FINDINGS: Postsurgical changes of left hip replacement. Visualized hardware intact. Anatomic alignment. Four views obtained. IMPRESSION: Postsurgical changes of left hip replacement. Electronically Signed   By: Marcello Moores  Register   On: 09/17/2020 12:45    EKG: Orders placed or performed in visit on 09/03/20  . EKG 12-Lead     Hospital Course: Marquasha Brutus is a 75 y.o. who was admitted to [facility]. They were brought to the operating room on 09/17/2020 and underwent Procedure(s): [place].  Patient tolerated the procedure  well and was later transferred to the recovery room and then to the orthopaedic floor  for postoperative care. They were given PO and IV analgesics for pain control following their surgery. They were given 24 hours of postoperative antibiotics of  Anti-infectives (From admission, onward)   Start     Dose/Rate Route Frequency Ordered Stop   09/17/20 1530  ceFAZolin (ANCEF) IVPB 2g/100 mL premix        2 g 200 mL/hr over 30 Minutes Intravenous Every 6 hours 09/17/20 1247 09/17/20 2151   09/17/20 0800  ceFAZolin (ANCEF) IVPB 2g/100 mL premix        2 g 200 mL/hr over 30 Minutes Intravenous On call to O.R. 09/17/20 0750 09/17/20 0929     and started on DVT prophylaxis in the form of Aspirin.   PT and OT were ordered for total joint protocol. Discharge planning consulted to help with postop disposition and equipment needs.  Patient had a good night on the evening of surgery. They started to get up OOB with therapy on POD #0. Pt was seen during rounds and was ready to go home pending progress with therapy.She worked with therapy on POD #1 and was meeting her goals. Pt was discharged to home later that day in stable condition.  Diet: Regular diet Activity: WBAT Follow-up: in 2 weeks Disposition: Home Discharged Condition: good   Discharge Instructions    Call MD / Call 911   Complete by: As directed    If you experience chest pain or shortness of breath, CALL 911 and be transported to the hospital emergency room.  If you develope a fever above 101 F, pus (white drainage) or increased drainage or redness at the wound, or calf pain, call your surgeon's office.   Change dressing   Complete by: As directed    Maintain surgical dressing until follow up in the clinic. If the edges start to pull up, may reinforce with tape. If the dressing is no longer working, may remove and cover with gauze and tape, but must keep the area dry and clean.  Call with any questions or concerns.   Constipation Prevention    Complete by: As directed    Drink plenty of fluids.  Prune juice may be helpful.  You may use a stool softener, such as Colace (over the counter) 100 mg twice a day.  Use MiraLax (over the counter) for constipation as needed.   Diet - low sodium heart healthy   Complete by: As directed    Increase activity slowly as tolerated   Complete by: As directed    Weight bearing as tolerated with assist device (walker, cane, etc) as directed, use it as long as suggested by your surgeon or therapist, typically at least 4-6 weeks.   Post-operative opioid taper instructions:   Complete by: As directed    POST-OPERATIVE OPIOID TAPER INSTRUCTIONS: It is important to wean off of your opioid medication as soon as possible. If you do not need pain medication after your surgery it is ok to stop day one. Opioids include: Codeine, Hydrocodone(Norco, Vicodin), Oxycodone(Percocet, oxycontin) and hydromorphone amongst others.  Long term and even short term use of opiods can cause: Increased pain response Dependence Constipation Depression Respiratory depression And more.  Withdrawal symptoms can include Flu like symptoms Nausea, vomiting And more Techniques to manage these symptoms Hydrate well Eat regular healthy meals Stay active Use relaxation techniques(deep breathing, meditating, yoga) Do Not substitute Alcohol to help with tapering If you have been on opioids for less than two weeks and do not have pain than it  is ok to stop all together.  Plan to wean off of opioids This plan should start within one week post op of your joint replacement. Maintain the same interval or time between taking each dose and first decrease the dose.  Cut the total daily intake of opioids by one tablet each day Next start to increase the time between doses. The last dose that should be eliminated is the evening dose.      TED hose   Complete by: As directed    Use stockings (TED hose) for 2 weeks on both leg(s).   You may remove them at night for sleeping.     Allergies as of 09/18/2020      Reactions   Gluten Meal Diarrhea, Other (See Comments)   Severe diarrhea per member   Codeine    Chest pains, acid reflux    Dust Mite Extract    Upper Respiratory issues    Gabapentin    Eyes went numb and blurry   Terbinafine Hcl Rash      Medication List    TAKE these medications   amLODipine 10 MG tablet Commonly known as: NORVASC Take 1 tablet (10 mg total) by mouth daily.   aspirin 81 MG chewable tablet Chew 1 tablet (81 mg total) by mouth 2 (two) times daily for 28 days.   CAL-MAG PO Take 2,000 mg by mouth at bedtime. 1000 mg   CLEAR EYES FOR DRY EYES OP Place 1 drop into both eyes daily.   dexlansoprazole 60 MG capsule Commonly known as: Dexilant Take 1 capsule (60 mg total) by mouth daily.   docusate sodium 100 MG capsule Commonly known as: COLACE Take 1 capsule (100 mg total) by mouth 2 (two) times daily.   hydrochlorothiazide 25 MG tablet Commonly known as: HYDRODIURIL Take 25 mg by mouth daily.   HYDROcodone-acetaminophen 7.5-325 MG tablet Commonly known as: NORCO Take 1-2 tablets by mouth every 6 (six) hours as needed for severe pain (pain score 7-10).   hydrocortisone 25 MG suppository Commonly known as: ANUSOL-HC Place 1 suppository (25 mg total) rectally 2 (two) times daily as needed for hemorrhoids or anal itching.   losartan 50 MG tablet Commonly known as: COZAAR Take 50 mg by mouth daily.   Magnesium Salicylate 342 MG Tabs Take 325 mg by mouth daily.   methocarbamol 500 MG tablet Commonly known as: ROBAXIN Take 1 tablet (500 mg total) by mouth every 6 (six) hours as needed for muscle spasms.   montelukast 10 MG tablet Commonly known as: SINGULAIR Take 10 mg by mouth at bedtime.   OVER THE COUNTER MEDICATION Take 2,000 mg by mouth daily. Pau-D'arco 1000 mg each   polyethylene glycol 17 g packet Commonly known as: MIRALAX / GLYCOLAX Take 17 g by mouth  daily as needed for mild constipation.   PROBIOTIC DAILY PO Take 1 tablet by mouth daily.   Vitamin A 2400 MCG (8000 UT) Caps Take 16,000 Units by mouth daily. 8000 units   vitamin C 1000 MG tablet Take 2,000 mg by mouth daily.   VITAMIN D PO Take 2,000 Units by mouth daily.            Discharge Care Instructions  (From admission, onward)         Start     Ordered   09/18/20 0000  Change dressing       Comments: Maintain surgical dressing until follow up in the clinic. If the edges start to pull up, may  reinforce with tape. If the dressing is no longer working, may remove and cover with gauze and tape, but must keep the area dry and clean.  Call with any questions or concerns.   09/18/20 0900          Follow-up Information    Paralee Cancel, MD. Schedule an appointment as soon as possible for a visit in 2 weeks.   Specialty: Orthopedic Surgery Contact information: [address]  [place] [address] [phone]        [facility] Follow up.   Specialty: Home Health Services Why: PT              Signed: Griffith Citron, PA-C Orthopedic Surgery 09/23/2020, 8:12 AM

## 2020-09-28 ENCOUNTER — Other Ambulatory Visit: Payer: Self-pay | Admitting: Nurse Practitioner

## 2020-09-28 DIAGNOSIS — I10 Essential (primary) hypertension: Secondary | ICD-10-CM

## 2020-10-03 ENCOUNTER — Ambulatory Visit (INDEPENDENT_AMBULATORY_CARE_PROVIDER_SITE_OTHER): Payer: MEDICARE | Admitting: Psychology

## 2020-10-03 DIAGNOSIS — F5101 Primary insomnia: Secondary | ICD-10-CM | POA: Diagnosis not present

## 2020-10-03 DIAGNOSIS — F4322 Adjustment disorder with anxiety: Secondary | ICD-10-CM | POA: Diagnosis not present

## 2020-10-07 ENCOUNTER — Other Ambulatory Visit: Payer: Self-pay

## 2020-10-07 ENCOUNTER — Ambulatory Visit (INDEPENDENT_AMBULATORY_CARE_PROVIDER_SITE_OTHER): Payer: MEDICARE | Admitting: Nurse Practitioner

## 2020-10-07 ENCOUNTER — Encounter: Payer: Self-pay | Admitting: Nurse Practitioner

## 2020-10-07 VITALS — BP 122/66 | HR 85 | Temp 98.2°F | Ht 61.4 in | Wt 151.4 lb

## 2020-10-07 DIAGNOSIS — R7309 Other abnormal glucose: Secondary | ICD-10-CM

## 2020-10-07 DIAGNOSIS — Z9989 Dependence on other enabling machines and devices: Secondary | ICD-10-CM

## 2020-10-07 DIAGNOSIS — E663 Overweight: Secondary | ICD-10-CM | POA: Diagnosis not present

## 2020-10-07 DIAGNOSIS — Z96642 Presence of left artificial hip joint: Secondary | ICD-10-CM

## 2020-10-07 DIAGNOSIS — Z8616 Personal history of COVID-19: Secondary | ICD-10-CM

## 2020-10-07 DIAGNOSIS — Z6828 Body mass index (BMI) 28.0-28.9, adult: Secondary | ICD-10-CM

## 2020-10-07 DIAGNOSIS — I10 Essential (primary) hypertension: Secondary | ICD-10-CM | POA: Diagnosis not present

## 2020-10-07 LAB — CBC
Hematocrit: 34 % (ref 34.0–46.6)
Hemoglobin: 11.5 g/dL (ref 11.1–15.9)
MCH: 27.6 pg (ref 26.6–33.0)
MCHC: 33.8 g/dL (ref 31.5–35.7)
MCV: 82 fL (ref 79–97)
Platelets: 368 10*3/uL (ref 150–450)
RBC: 4.16 x10E6/uL (ref 3.77–5.28)
RDW: 13.1 % (ref 11.7–15.4)
WBC: 5.4 10*3/uL (ref 3.4–10.8)

## 2020-10-07 LAB — HEMOGLOBIN A1C
Est. average glucose Bld gHb Est-mCnc: 120 mg/dL
Hgb A1c MFr Bld: 5.8 % — ABNORMAL HIGH (ref 4.8–5.6)

## 2020-10-07 MED ORDER — LOSARTAN POTASSIUM 50 MG PO TABS: 1.0000 | ORAL_TABLET | Freq: Every day | ORAL | 1 refills | Status: DC

## 2020-10-07 MED ORDER — HYDROCHLOROTHIAZIDE 25 MG PO TABS: 1.0000 | ORAL_TABLET | Freq: Two times a day (BID) | ORAL | 3 refills | Status: DC

## 2020-10-07 MED ORDER — AMLODIPINE BESYLATE 10 MG PO TABS: 1.0000 | ORAL_TABLET | Freq: Every day | ORAL | 1 refills | Status: DC

## 2020-10-07 MED ORDER — MONTELUKAST SODIUM 10 MG PO TABS: 10.0000 mg | ORAL_TABLET | Freq: Every day | ORAL | 1 refills | Status: DC

## 2020-10-07 NOTE — Patient Instructions (Signed)
Preventing Type 2 Diabetes Mellitus Type 2 diabetes, also called type 2 diabetes mellitus, is a long-term (chronic) disease that affects sugar (glucose) levels in your blood. Normally, a hormone called insulin allows glucose to enter cells in your body. The cells use glucose for energy. With type 2 diabetes, you will have one or both of these problems:  Your pancreas does not make enough insulin.  Cells in your body do not respond properly to insulin that your body makes (insulin resistance). Insulin resistance or lack of insulin causes extra glucose to build up in the blood instead of going into cells. As a result, high blood glucose (hyperglycemia) develops. That can cause many complications. Being overweight or obese and having an inactive (sedentary) lifestyle can increase your risk for diabetes. Type 2 diabetes can be delayed or prevented by making certain nutrition and lifestyle changes. How can this condition affect me? If you do not take steps to prevent diabetes, your blood glucose levels may keep increasing over time. Too much glucose in your blood for a long time can damage your blood vessels, heart, kidneys, nerves, and eyes. Type 2 diabetes can lead to chronic health problems and complications, such as:  Heart disease.  Stroke.  Blindness.  Kidney disease.  Depression.  Poor circulation in your feet and legs. In severe cases, a foot or leg may need to be surgically removed (amputated). What can increase my risk? You may be more likely to develop type 2 diabetes if you:  Have type 2 diabetes in your family.  Are overweight or obese.  Have a sedentary lifestyle.  Have insulin resistance or a history of prediabetes.  Have a history of pregnancy-related (gestational) diabetes or polycystic ovary syndrome (PCOS). What actions can I take to prevent this? It can be difficult to recognize signs of type 2 diabetes. Taking action to prevent the disease before you develop  symptoms is the best way to avoid possible damage to your body. Making certain nutrition and lifestyle changes may prevent or delay the disease and related health problems. Nutrition  Eat healthy meals and snacks regularly. Do not skip meals. Fruit or a handful of nuts is a healthy snack between meals.  Drink water throughout the day. Avoid drinks that contain added sugar, such as soda or sweetened tea. Drink enough fluid to keep your urine pale yellow.  Follow instructions from your health care provider about eating or drinking restrictions.  Limit the amount of food you eat by: ? Controlling how much you eat at a time (portion size). ? Checking food labels for the serving sizes of food. ? Using a kitchen scale to weigh amounts of food.  Saut or steam food instead of frying it. Cook with water or broth instead of oils or butter.  Limit saturated fat and salt (sodium) in your diet. Have no more than 1 tsp (2,400 mg) of sodium a day. If you have heart disease or high blood pressure, use less than ? tsp (1,500 mg) of sodium a day.   Lifestyle  Lose weight if needed and as told. Your health care provider can determine how much weight loss is best for you and can help you lose weight safely.  If you are overweight or obese, you may be told to lose at least 5?7% of your body weight.  Manage blood pressure, cholesterol, and stress. Your health care provider will help determine the best treatment for you.  Do not use any products that contain nicotine or tobacco,  such as cigarettes, e-cigarettes, and chewing tobacco. If you need help quitting, ask your health care provider.   Activity  Do physical activity that makes your heart beat faster and makes you sweat (moderate intensity). Do this for at least 30 minutes on at least 5 days of the week, or as much as told by your health care provider.  Ask your health care provider what activities are safe for you. A mix of activities may be best,  such as walking, swimming, cycling, and strength training.  Try to add physical activity into your day. For example: ? Park your car farther away than usual so that you walk more. ? Take a walk during your lunch break. ? Use stairs instead of elevators or escalators. ? Walk or bike to work instead of driving.   Alcohol use If you drink alcohol:  Limit how much you use to: ? 0?1 drink a day for women who are not pregnant. ? 0?2 drinks a day for men.  Be aware of how much alcohol is in your drink. In the [place] one drink equals one 12 oz bottle of beer (355 mL), one 5 oz glass of wine (148 mL), or one 1 oz glass of hard liquor (44 mL). General information  Talk with your health care provider about your risk factors and how you can reduce your risk for diabetes.  Have your blood glucose tested regularly, as told by your health care provider.  Get screening tests as told by your health care provider. You may have these regularly, especially if you have certain risk factors for type 2 diabetes.  Make an appointment with a registered dietitian. This diet and nutrition specialist can help you make a healthy eating plan and help you understand portion sizes and food labels. Where to find support  Ask your health care provider to recommend a registered dietitian, a certified diabetes care and education specialist, or a weight loss program.  Look for local or online weight loss groups.  Join a gym, fitness club, or outdoor activity group, such as a walking club. Where to find more information To learn more about diabetes and diabetes prevention, visit:  American Diabetes Association (ADA): www.diabetes.[employer] of Diabetes and Digestive and Kidney Diseases: [employer] To learn more about healthy eating, visit:  [place].S. Department of Agriculture Scientist, research (physical sciences)): [place]B[employer].no  Office of Disease Prevention and Health Promotion (ODPHP): [place] Summary  You can  delay or prevent type 2 diabetes by eating healthy foods, losing weight if needed, and increasing your physical activity.  Talk with your health care provider about your risk factors for type 2 diabetes and how you can reduce your risk.  It can be difficult to recognize the signs of type 2 diabetes. The best way to avoid possible damage to your body is to take action to prevent the disease before you develop symptoms.  Get screening tests as told by your health care provider. This information is not intended to replace advice given to you by your health care provider. Make sure you discuss any questions you have with your health care provider. Document Revised: 12/13/2019 Document Reviewed: 12/13/2019 Elsevier Patient Education  [place].

## 2020-10-07 NOTE — Progress Notes (Signed)
I,[name],acting as a Education administrator for [employer], FNP.,have documented all relevant documentation on the behalf of [name], FNP,as directed by  [name], FNP while in the presence of [name], [place].  This visit occurred during the SARS-CoV-2 public health emergency.  Safety protocols were in place, including screening questions prior to the visit, additional usage of staff PPE, and extensive cleaning of exam room while observing appropriate contact time as indicated for disinfecting solutions.  Subjective:     Patient ID: Deanna [name] , female    DOB: 1945/09/06 , 75 y.o.   MRN: [id number]   Chief Complaint  Patient presents with  . abnormal glucose    HPI  Patient is here for abnormal glucose follow up. She had hip replacement surgery in April 2022. She states that she feels good and has no complaints that this time. She is taking pain medications which has her constipated.  She tested positive for covid in April, all she had was a runny nose.   She has PT 3 days a week and will decreasing to 2 days this week.     Past Medical History:  Diagnosis Date  . Abnormal glucose 03/06/2020  . Age-related osteoporosis without current pathological fracture 11/22/2015   Overview:  Femoral neck  Formatting of this note might be different from the original. Femoral neck  . Allergic rhinitis due to pollen 08/19/2015  . Arthritis   . Carpal tunnel syndrome, bilateral 02/28/2002   Overview:  H/O CTS  . Celiac disease   . Cervical disc disorder 05/10/2009  . 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 Added automatically from request for surgery [id number]  . Chronic pain syndrome 04/01/2015  . Colon polyps   . Essential hypertension 07/20/2000   Overview:  HBP  . Female cystocele 02/27/2009  . GAD (generalized anxiety disorder) 08/19/2015  . GERD (gastroesophageal reflux disease)   . History of  Bell's palsy 03/21/2015  . Hx of adenomatous colonic polyps 12/20/2008   Overview:  Colonoscopy 07/2003 - [facility] - polyps removed - recommended repeat in 3 years Colonoscopy 09/2011 - Dr. Marian Sorrow - hyperplastic polyp(s) were removed - repeat in 5 years  . Hypertension   . Incomplete uterovaginal prolapse 03/04/2004  . Insomnia   . Left bundle-branch block 05/20/2006  . Lumbar radiculopathy 01/09/2016   Formatting of this note might be different from the original. Added automatically from request for surgery [id number]  . LVH (left ventricular 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 05/23/2015  . Non-rheumatic mitral regurgitation 03/21/2015  . Osteopenia determined by x-ray 11/22/2015   Formatting of this note might be different from the original. Femoral neck  . Other intervertebral disc degeneration, lumbar region 01/29/2015  . Primary osteoarthritis, right hand 02/28/2002   Overview:  OA HANDS, ESP THUMBS  . Radial tunnel syndrome, right 11/04/2016  . Radiculopathy, lumbar region 11/22/2014   Overview:  Added automatically from request for surgery [id number]  . Rhinitis, allergic 02/24/2002   Overview:  ALLERGY SYMPTOMS - states was seen by allergist in past and informed allergic to dust  . Sacroiliitis ([place]) 08/02/2019  . Screening for colon cancer 04/17/2005   Formatting of this note might be different from the original. Colonoscopy 2005  . Sensorineural hearing loss (SNHL) of both ears 07/05/2019   pt can hear normally  . Short sleeper 04/29/2020  . Snoring 04/29/2020  . Spinal stenosis   . Spinal stenosis,  lumbar region without neurogenic claudication 11/22/2014  . Tinnitus aurium, bilateral 07/05/2019  . Trigger thumb, unspecified thumb 02/28/2002   Formatting of this note might be different from the original. TRIGGER THUMBS - started spring 2003. Injected 02/28/02     Family History  Problem Relation Age of Onset  . Arthritis Mother   . Diabetes Mother   . Pancreatic cancer Mother   . Early death  Father   . Leukemia Father   . Early death Sister   . Diabetes Sister      Current Outpatient Medications:  .  Ascorbic Acid (VITAMIN C) 1000 MG tablet, Take 2,000 mg by mouth daily., Disp: , Rfl:  .  aspirin 81 MG chewable tablet, Chew 1 tablet (81 mg total) by mouth 2 (two) times daily for 28 days., Disp: 56 tablet, Rfl: 0 .  Calcium-Magnesium (CAL-MAG PO), Take 2,000 mg by mouth at bedtime. 1000 mg, Disp: , Rfl:  .  Carboxymethylcellul-Glycerin (CLEAR EYES FOR DRY EYES OP), Place 1 drop into both eyes daily., Disp: , Rfl:  .  Cholecalciferol (VITAMIN D PO), Take 2,000 Units by mouth daily., Disp: , Rfl:  .  dexlansoprazole (DEXILANT) 60 MG capsule, Take 1 capsule (60 mg total) by mouth daily., Disp: 30 capsule, Rfl: 5 .  docusate sodium (COLACE) 100 MG capsule, TAKE 1 CAPSULE BY MOUTH TWICE A DAY, Disp: 10 capsule, Rfl: 0 .  HYDROcodone-acetaminophen (NORCO/VICODIN) 5-325 MG tablet, Take 1 tablet by mouth every 6 (six) hours as needed., Disp: , Rfl:  .  hydrocortisone (ANUSOL-HC) 25 MG suppository, Place 1 suppository (25 mg total) rectally 2 (two) times daily as needed for hemorrhoids or anal itching., Disp: 12 suppository, Rfl: 3 .  Magnesium Salicylate 814 MG TABS, Take 325 mg by mouth daily., Disp: , Rfl:  .  methocarbamol (ROBAXIN) 500 MG tablet, Take 1 tablet (500 mg total) by mouth every 6 (six) hours as needed for muscle spasms., Disp: 40 tablet, Rfl: 0 .  OVER THE COUNTER MEDICATION, Take 2,000 mg by mouth daily. Pau-D'arco 1000 mg each, Disp: , Rfl:  .  polyethylene glycol (MIRALAX / GLYCOLAX) 17 g packet, Take 17 g by mouth daily as needed for mild constipation., Disp: 14 each, Rfl: 0 .  Probiotic Product (PROBIOTIC DAILY PO), Take 1 tablet by mouth daily., Disp: , Rfl:  .  Vitamin A 2400 MCG (8000 UT) CAPS, Take 16,000 Units by mouth daily. 8000 units, Disp: , Rfl:  .  amLODipine (NORVASC) 10 MG tablet, Take 1 tablet (10 mg total) by mouth daily., Disp: 90 tablet, Rfl: 1 .   hydrochlorothiazide (HYDRODIURIL) 25 MG tablet, Take 1 tablet (25 mg total) by mouth 2 (two) times daily., Disp: 180 tablet, Rfl: 3 .  losartan (COZAAR) 50 MG tablet, Take 1 tablet (50 mg total) by mouth daily., Disp: 90 tablet, Rfl: 1 .  montelukast (SINGULAIR) 10 MG tablet, Take 1 tablet (10 mg total) by mouth at bedtime., Disp: 90 tablet, Rfl: 1   Allergies  Allergen Reactions  . Gluten Meal Diarrhea and Other (See Comments)    Severe diarrhea per member   . Codeine     Chest pains, acid reflux   . Dust Mite Extract     Upper Respiratory issues   . Gabapentin     Eyes went numb and blurry  . Terbinafine Hcl Rash     Review of Systems  Constitutional: Negative.   Respiratory: Negative.   Cardiovascular: Negative.   Gastrointestinal: Negative.  Endocrine: Negative for polydipsia, polyphagia and polyuria.  Musculoskeletal: Negative.   Neurological: Negative.   Psychiatric/Behavioral: Negative.      Today's Vitals   10/07/20 1058  BP: 122/66  Pulse: 85  Temp: 98.2 F (36.8 C)  TempSrc: Oral  Weight: 151 lb 6.4 oz (68.7 kg)  Height: 5' 1.4" (1.56 m)   Body mass index is 28.24 kg/m.  Wt Readings from Last 3 Encounters:  10/07/20 151 lb 6.4 oz (68.7 kg)  09/17/20 155 lb (70.3 kg)  09/16/20 155 lb (70.3 kg)    Objective:  Physical Exam Vitals reviewed.  Constitutional:      General: She is not in acute distress.    Appearance: Normal appearance. She is well-developed.  HENT:     Head: Normocephalic and atraumatic.  Eyes:     Pupils: Pupils are equal, round, and reactive to light.  Cardiovascular:     Rate and Rhythm: Normal rate and regular rhythm.     Pulses: Normal pulses.     Heart sounds: Normal heart sounds. No murmur heard.   Pulmonary:     Effort: Pulmonary effort is normal. No respiratory distress.     Breath sounds: Normal breath sounds. No wheezing.  Musculoskeletal:        General: Tenderness (left hip replacement) present. Normal range of  motion.     Comments: Using a cane at this time  Skin:    General: Skin is warm and dry.     Capillary Refill: Capillary refill takes less than 2 seconds.  Neurological:     General: No focal deficit present.     Mental Status: She is alert and oriented to [place], place, and time.     Cranial Nerves: No cranial nerve deficit.  Psychiatric:        Mood and Affect: Mood normal.        Behavior: Behavior normal.        Thought Content: Thought content normal.        Judgment: Judgment normal.         Assessment And Plan:     1. Abnormal glucose  Chronic, stable  Continue with current medications  Encouraged to limit intake of sugary foods and drinks  She is currently in PT after her hip replacement - Hemoglobin A1c - CBC  2. Essential hypertension . B/P is well controlled.  . She has had a kidney function done which was normal  . The importance of regular exercise and dietary modification was stressed to the patient.  - CBC  3. Overweight with body mass index (BMI) of 28 to 28.9 in adult  Chronic  Discussed healthy diet and regular exercise options   Encouraged to exercise as tolerated when she is cleared from orthopedics  She is encouraged to strive for BMI less than 26 to decrease cardiac risk. Advised to aim for at least 150 minutes of exercise per week.  4. History of COVID-19  5. Status post total replacement of left hip Currently in PT and using cane, reports she is doing well overall     Patient was given opportunity to ask questions. Patient verbalized understanding of the plan and was able to repeat key elements of the plan. All questions were answered to their satisfaction.  [name], FNP   I, [name], FNP, have reviewed all documentation for this visit. The documentation on 10/07/20 for the exam, diagnosis, procedures, and orders are all accurate and complete.   IF YOU HAVE BEEN  REFERRED TO A SPECIALIST, IT MAY TAKE 1-2 WEEKS TO  SCHEDULE/PROCESS THE REFERRAL. IF YOU HAVE NOT HEARD FROM US/SPECIALIST IN TWO WEEKS, PLEASE GIVE [place] A CALL AT [phone] X [phone].   THE PATIENT IS ENCOURAGED TO PRACTICE SOCIAL DISTANCING DUE TO THE COVID-19 PANDEMIC.

## 2020-10-16 ENCOUNTER — Encounter: Payer: Self-pay | Admitting: Nurse Practitioner

## 2020-10-17 ENCOUNTER — Ambulatory Visit (INDEPENDENT_AMBULATORY_CARE_PROVIDER_SITE_OTHER): Payer: MEDICARE | Admitting: Psychology

## 2020-10-17 ENCOUNTER — Ambulatory Visit: Payer: MEDICARE | Admitting: Psychology

## 2020-10-17 DIAGNOSIS — F4322 Adjustment disorder with anxiety: Secondary | ICD-10-CM

## 2020-10-17 DIAGNOSIS — F5101 Primary insomnia: Secondary | ICD-10-CM

## 2020-10-31 ENCOUNTER — Other Ambulatory Visit: Payer: Self-pay

## 2020-10-31 ENCOUNTER — Telehealth (INDEPENDENT_AMBULATORY_CARE_PROVIDER_SITE_OTHER): Payer: MEDICARE | Admitting: Nurse Practitioner

## 2020-10-31 ENCOUNTER — Encounter: Payer: Self-pay | Admitting: Nurse Practitioner

## 2020-10-31 VITALS — BP 150/81 | HR 80 | Ht 61.4 in | Wt 150.0 lb

## 2020-10-31 DIAGNOSIS — J301 Allergic rhinitis due to pollen: Secondary | ICD-10-CM | POA: Diagnosis not present

## 2020-10-31 DIAGNOSIS — J019 Acute sinusitis, unspecified: Secondary | ICD-10-CM

## 2020-10-31 MED ORDER — AMOXICILLIN-POT CLAVULANATE 875-125 MG PO TABS
1.0000 | ORAL_TABLET | Freq: Two times a day (BID) | ORAL | 0 refills | Status: AC
Start: 2020-10-31 — End: 2020-11-07

## 2020-10-31 MED ORDER — FEXOFENADINE HCL 180 MG PO TABS: 180.0000 mg | ORAL_TABLET | Freq: Every day | ORAL | 2 refills | Status: DC

## 2020-10-31 MED ORDER — MOMETASONE FUROATE 50 MCG/ACT NA SUSP: 2.0000 | Freq: Every day | NASAL | 2 refills | Status: DC

## 2020-10-31 NOTE — Progress Notes (Signed)
Virtual Visit via Mychart   This visit type was conducted due to national recommendations for restrictions regarding the COVID-19 Pandemic (e.g. social distancing) in an effort to limit this patient's exposure and mitigate transmission in our community.  Due to her co-morbid illnesses, this patient is at least at moderate risk for complications without adequate follow up.  This format is felt to be most appropriate for this patient at this time.  All issues noted in this document were discussed and addressed.  A limited physical exam was performed with this format.    This visit type was conducted due to national recommendations for restrictions regarding the COVID-19 Pandemic (e.g. social distancing) in an effort to limit this patient's exposure and mitigate transmission in our community.  Patients identity confirmed using two different identifiers.  This format is felt to be most appropriate for this patient at this time.  All issues noted in this document were discussed and addressed.  No physical exam was performed (except for noted visual exam findings with Video Visits).    Date:  10/31/2020   ID:  Deanna [name], DOB 1945-09-07, MRN [id number]  Patient Location:  Home - spoke with Deanna [name]  Provider location:   Office    Chief Complaint:  Allergies, coughing  History of Present Illness:    Deanna [name] is a 75 y.o. female who presents via video conferencing for a telehealth visit today.    The patient does have symptoms concerning for COVID-19 infection (fever, chills, cough, or new shortness of breath).   She is having an increase in mucous and nasal drainage. She is blowing her nose constantly. She started with the symptoms one week ago and her last covid test was done yesterday which was negative. She does take Singular but had stopped because she thought it was worse. She does not have a loss of taste or smell. She will cough up clear mucous.      Past Medical History:   Diagnosis Date  . Abnormal glucose 03/06/2020  . Age-related osteoporosis without current pathological fracture 11/22/2015   Overview:  Femoral neck  Formatting of this note might be different from the original. Femoral neck  . Allergic rhinitis due to pollen 08/19/2015  . Arthritis   . Carpal tunnel syndrome, bilateral 02/28/2002   Overview:  H/O CTS  . Celiac disease   . Cervical disc disorder 05/10/2009  . 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 Added automatically from request for surgery [id number]  . Chronic pain syndrome 04/01/2015  . Colon polyps   . Essential hypertension 07/20/2000   Overview:  HBP  . Female cystocele 02/27/2009  . GAD (generalized anxiety disorder) 08/19/2015  . GERD (gastroesophageal reflux disease)   . History of Bell's palsy 03/21/2015  . Hx of adenomatous colonic polyps 12/20/2008   Overview:  Colonoscopy 07/2003 - [facility] - polyps removed - recommended repeat in 3 years Colonoscopy 09/2011 - Dr. Marian Sorrow - hyperplastic polyp(s) were removed - repeat in 5 years  . Hypertension   . Incomplete uterovaginal prolapse 03/04/2004  . Insomnia   . Left bundle-branch block 05/20/2006  . Lumbar radiculopathy 01/09/2016   Formatting of this note might be different from the original. Added automatically from request for surgery [id number]  . LVH (left ventricular 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 05/23/2015  . Non-rheumatic mitral regurgitation 03/21/2015  . Osteopenia determined by x-ray 11/22/2015   Formatting of  this note might be different from the original. Femoral neck  . Other intervertebral disc degeneration, lumbar region 01/29/2015  . Primary osteoarthritis, right hand 02/28/2002   Overview:  OA HANDS, ESP THUMBS  . Radial tunnel syndrome, right 11/04/2016  . Radiculopathy, lumbar region 11/22/2014   Overview:  Added automatically from request for surgery [id number]  . Rhinitis,  allergic 02/24/2002   Overview:  ALLERGY SYMPTOMS - states was seen by allergist in past and informed allergic to dust  . Sacroiliitis ([place]) 08/02/2019  . Screening for colon cancer 04/17/2005   Formatting of this note might be different from the original. Colonoscopy 2005  . Sensorineural hearing loss (SNHL) of both ears 07/05/2019   pt can hear normally  . Short sleeper 04/29/2020  . Snoring 04/29/2020  . Spinal stenosis   . Spinal stenosis, lumbar region without neurogenic claudication 11/22/2014  . Tinnitus aurium, bilateral 07/05/2019  . Trigger thumb, unspecified thumb 02/28/2002   Formatting of this note might be different from the original. TRIGGER THUMBS - started spring 2003. Injected 02/28/02   Past Surgical History:  Procedure Laterality Date  . ABDOMINAL HYSTERECTOMY  2010  . APPENDECTOMY  1969  . CHOLECYSTECTOMY  1969  . SPINAL FUSION  01/2016  . TONSILLECTOMY AND ADENOIDECTOMY  1956  . TOTAL HIP ARTHROPLASTY Left 09/17/2020   Procedure: TOTAL HIP ARTHROPLASTY ANTERIOR APPROACH;  Surgeon: Paralee Cancel, MD;  Location: [facility];  Service: Orthopedics;  Laterality: Left;  70 mins     Current Meds  Medication Sig  . amLODipine (NORVASC) 10 MG tablet Take 1 tablet (10 mg total) by mouth daily.  Marland Kitchen amoxicillin-clavulanate (AUGMENTIN) 875-125 MG tablet Take 1 tablet by mouth 2 (two) times daily for 7 days.  . Ascorbic Acid (VITAMIN C) 1000 MG tablet Take 2,000 mg by mouth daily.  . Calcium-Magnesium (CAL-MAG PO) Take 2,000 mg by mouth at bedtime. 1000 mg  . Carboxymethylcellul-Glycerin (CLEAR EYES FOR DRY EYES OP) Place 1 drop into both eyes daily.  . Cholecalciferol (VITAMIN D PO) Take 2,000 Units by mouth daily.  Marland Kitchen dexlansoprazole (DEXILANT) 60 MG capsule Take 1 capsule (60 mg total) by mouth daily.  Marland Kitchen docusate sodium (COLACE) 100 MG capsule TAKE 1 CAPSULE BY MOUTH TWICE A DAY  . fexofenadine (ALLEGRA ALLERGY) 180 MG tablet Take 1 tablet (180 mg total) by mouth daily.  .  hydrochlorothiazide (HYDRODIURIL) 25 MG tablet Take 1 tablet (25 mg total) by mouth 2 (two) times daily.  Marland Kitchen HYDROcodone-acetaminophen (NORCO/VICODIN) 5-325 MG tablet Take 1 tablet by mouth every 6 (six) hours as needed.  . hydrocortisone (ANUSOL-HC) 25 MG suppository Place 1 suppository (25 mg total) rectally 2 (two) times daily as needed for hemorrhoids or anal itching.  . losartan (COZAAR) 50 MG tablet Take 1 tablet (50 mg total) by mouth daily.  . Magnesium Salicylate 932 MG TABS Take 325 mg by mouth daily.  . methocarbamol (ROBAXIN) 500 MG tablet Take 1 tablet (500 mg total) by mouth every 6 (six) hours as needed for muscle spasms.  . mometasone (NASONEX) 50 MCG/ACT nasal spray Place 2 sprays into the nose daily.  . montelukast (SINGULAIR) 10 MG tablet Take 1 tablet (10 mg total) by mouth at bedtime.  Marland Kitchen OVER THE COUNTER MEDICATION Take 2,000 mg by mouth daily. Pau-D'arco 1000 mg each  . polyethylene glycol (MIRALAX / GLYCOLAX) 17 g packet Take 17 g by mouth daily as needed for mild constipation.  . Probiotic Product (PROBIOTIC DAILY PO) Take 1 tablet by mouth  daily.  . Vitamin A 2400 MCG (8000 UT) CAPS Take 16,000 Units by mouth daily. 8000 units     Allergies:   Gluten meal, Codeine, Dust mite extract, Gabapentin, and Terbinafine hcl   Social History   Tobacco Use  . Smoking status: Former Smoker    Types: Cigarettes    Quit date: 12/31/1997    Years since quitting: 22.8  . Smokeless tobacco: Never Used  Vaping Use  . Vaping Use: Never used  Substance Use Topics  . Alcohol use: Yes  . Drug use: No     Family Hx: The patient's family history includes Arthritis in her mother; Diabetes in her mother and sister; Early death in her father and sister; Leukemia in her father; Pancreatic cancer in her mother.  ROS:   Please see the history of present illness.    Review of Systems  Constitutional: Negative.   Respiratory: Positive for cough. Negative for sputum production, shortness  of breath and wheezing.   Cardiovascular: Negative.   Neurological: Negative for dizziness and tingling.  Psychiatric/Behavioral: Negative.     All other systems reviewed and are negative.   Labs/Other Tests and Data Reviewed:    Recent Labs: 09/16/2020: ALT 19 09/18/2020: BUN 15; Creatinine, Ser 0.46; Potassium 4.1; Sodium 137 10/07/2020: Hemoglobin 11.5; Platelets 368   Recent Lipid Panel No results found for: CHOL, TRIG, HDL, CHOLHDL, LDLCALC, LDLDIRECT  Wt Readings from Last 3 Encounters:  10/31/20 150 lb (68 kg)  10/07/20 151 lb 6.4 oz (68.7 kg)  09/17/20 155 lb (70.3 kg)     Exam:    Vital Signs:  BP (!) 150/81 (BP Location: Right Arm, Patient Position: Sitting, Cuff Size: Small)   Pulse 80   Ht 5' 1.4" (1.56 m)   Wt 150 lb (68 kg)   BMI 27.97 kg/m     Physical Exam Vitals reviewed.  Constitutional:      General: She is not in acute distress.    Appearance: Normal appearance.  HENT:     Nose:     Right Sinus: Frontal sinus tenderness (when she palpates) present.     Left Sinus: Frontal sinus tenderness (when she palpates) present.  Pulmonary:     Effort: No respiratory distress.     Breath sounds: No wheezing.  Neurological:     General: No focal deficit present.     Mental Status: She is alert and oriented to [place], place, and time.     Cranial Nerves: No cranial nerve deficit.  Psychiatric:        Mood and Affect: Mood normal.        Behavior: Behavior normal.        Thought Content: Thought content normal.        Judgment: Judgment normal.     ASSESSMENT & PLAN:    1. Seasonal allergic rhinitis due to pollen  She is to try allegra and nasonex nasal spray and continue with montelukast - fexofenadine (ALLEGRA ALLERGY) 180 MG tablet; Take 1 tablet (180 mg total) by mouth daily.  Dispense: 30 tablet; Refill: 2 - mometasone (NASONEX) 50 MCG/ACT nasal spray; Place 2 sprays into the nose daily.  Dispense: 1 each; Refill: 2  2. Acute non-recurrent  sinusitis, unspecified location  She will be at 10 days with symptoms on Saturday and has frontal sinus pain ,will treat with augmentin if the allegra/nasonex is not effective - amoxicillin-clavulanate (AUGMENTIN) 875-125 MG tablet; Take 1 tablet by mouth 2 (two) times daily for 7  days.  Dispense: 14 tablet; Refill: 0   COVID-19 Education: The signs and symptoms of COVID-19 were discussed with the patient and how to seek care for testing (follow up with PCP or arrange E-visit).  The importance of social distancing was discussed today.  Patient Risk:   After full review of this patients clinical status, I feel that they are at least moderate risk at this time.  Time:   Today, I have spent 10.30 minutes/ seconds with the patient with telehealth technology discussing above diagnoses.     Medication Adjustments/Labs and Tests Ordered: Current medicines are reviewed at length with the patient today.  Concerns regarding medicines are outlined above.   Tests Ordered: No orders of the defined types were placed in this encounter.   Medication Changes: Meds ordered this encounter  Medications  . fexofenadine (ALLEGRA ALLERGY) 180 MG tablet    Sig: Take 1 tablet (180 mg total) by mouth daily.    Dispense:  30 tablet    Refill:  2  . mometasone (NASONEX) 50 MCG/ACT nasal spray    Sig: Place 2 sprays into the nose daily.    Dispense:  1 each    Refill:  2  . amoxicillin-clavulanate (AUGMENTIN) 875-125 MG tablet    Sig: Take 1 tablet by mouth 2 (two) times daily for 7 days.    Dispense:  14 tablet    Refill:  0    Disposition:  Follow up prn  Signed, [name], FNP

## 2020-11-11 ENCOUNTER — Other Ambulatory Visit: Payer: Self-pay

## 2020-11-11 ENCOUNTER — Ambulatory Visit (HOSPITAL_BASED_OUTPATIENT_CLINIC_OR_DEPARTMENT_OTHER): Payer: MEDICARE | Attending: Orthopedic Surgery | Admitting: Physical Therapy

## 2020-11-11 DIAGNOSIS — M25552 Pain in left hip: Secondary | ICD-10-CM

## 2020-11-11 DIAGNOSIS — R2689 Other abnormalities of gait and mobility: Secondary | ICD-10-CM | POA: Insufficient documentation

## 2020-11-11 NOTE — Patient Instructions (Signed)
ccess Code: [email or web address] URL: [email or web address] Date: 11/11/2020 Prepared by: Carolyne Littles  Exercises Standing March with Counter Support - 1 x daily - 7 x weekly - 3 sets - 10 reps Heel rises with counter support - 1 x daily - 7 x weekly - 3 sets - 10 reps Supine Bridge - 1 x daily - 7 x weekly - 3 sets - 10 reps Standing Upper Trapezius Mobilization with Small Ball - 1 x daily - 7 x weekly - 3 sets - 10 reps

## 2020-11-12 NOTE — Therapy (Signed)
[facility] [address] [place], [place], [address] Phone: [phone]   Fax:  [phone]  Physical Therapy Evaluation  Patient Details  Name: Deanna [name] MRN: [id number] Date of Birth: 1946-02-14 Referring Provider (PT): Dr Paralee Cancel   Encounter Date: 11/11/2020   PT End of Session - 11/12/20 1118     Visit Number 1    Number of Visits 6    Date for PT Re-Evaluation 12/24/20    Authorization Type [employer] progress note at 10 visits; KX at 15 visits.    Activity Tolerance Patient tolerated treatment well    Behavior During Therapy [facility] for tasks assessed/performed             Past Medical History:  Diagnosis Date   Abnormal glucose 03/06/2020   Age-related osteoporosis without current pathological fracture 11/22/2015   Overview:  Femoral neck  Formatting of this note might be different from the original. 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 Added automatically from request for surgery [id number]   Chronic pain syndrome 04/01/2015   Colon polyps    Essential hypertension 07/20/2000   Overview:  HBP   Female cystocele 02/27/2009   GAD (generalized anxiety disorder) 08/19/2015   GERD (gastroesophageal reflux disease)    History of Bell's palsy 03/21/2015   Hx of adenomatous colonic polyps 12/20/2008   Overview:  Colonoscopy 07/2003 - LMD - polyps removed - recommended repeat in 3 years Colonoscopy 09/2011 - Dr. Marian Sorrow - hyperplastic polyp(s) were removed - repeat in 5 years   Hypertension    Incomplete uterovaginal prolapse 03/04/2004   Insomnia    Left bundle-branch block 05/20/2006   Lumbar radiculopathy 01/09/2016   Formatting of this  note might be different from the original. 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 Femoral neck   Other intervertebral disc degeneration, lumbar region 01/29/2015   Primary osteoarthritis, right hand 02/28/2002   Overview:  OA HANDS, ESP THUMBS   Radial tunnel syndrome, right 11/04/2016   Radiculopathy, lumbar region 11/22/2014   Overview:  Added automatically from request for surgery [id number]   Rhinitis, allergic 02/24/2002   Overview:  ALLERGY SYMPTOMS - states was seen by allergist in past and informed allergic to dust   Sacroiliitis ([place]) 08/02/2019   Screening for colon cancer 04/17/2005   Formatting of this note might be different from the original. 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 TRIGGER THUMBS - started spring 2003. Injected 02/28/02    Past Surgical History:  Procedure Laterality Date   ABDOMINAL HYSTERECTOMY  2010   [place]   SPINAL  FUSION  01/2016   TONSILLECTOMY AND ADENOIDECTOMY  1956   TOTAL HIP ARTHROPLASTY Left 09/17/2020   Procedure: TOTAL HIP ARTHROPLASTY ANTERIOR APPROACH;  Surgeon: Paralee Cancel, MD;  Location: [facility];  Service: Orthopedics;  Laterality: Left;  70 mins    There were no vitals filed for this visit.    Subjective Assessment - 11/11/20 1352     Subjective Patient had a left THA on 09/17/2020. She has recently been released form  the MD for a yar. She has had home PT but continues to favor the left side. She has occasional pain into her left thigh and into her left buttck.    Currently in Pain? Yes    Pain Score 5     Pain Location Hip    Pain Orientation Left    Pain Descriptors / Indicators Aching    Pain Type Chronic pain    Pain Radiating Towards radiates into the right quad    Pain Onset 1 to 4 weeks ago    Pain Frequency Intermittent    Aggravating Factors  any prolinged positioning    Pain Relieving Factors rest    Effect of Pain on Daily Activities difficulty perfromign ADL's    Multiple Pain Sites No                [facility] PT Assessment - 11/12/20 0001       Assessment   Medical Diagnosis Left THA    Referring Provider (PT) Dr Paralee Cancel    Onset Date/Surgical Date 11/18/20    Hand Dominance Right    Next MD Visit 1 year follow up    Prior Therapy None      Precautions   Precautions None      Restrictions   Weight Bearing Restrictions No      Balance Screen   Has the patient fallen in the past 6 months No    Has the patient had a decrease in activity level because of a fear of falling?  No    Is the patient reluctant to leave their home because of a fear of falling?  No      Home Environment   Additional Comments No stairs in her house      Prior Function   Level of Independence Independent   keeps a cane in the car   Vocation Retired    [employer] works at [employer]; cooks food    Leisure walking;      Cognition   Overall Cognitive Status Within Functional Limits for tasks assessed    Attention Focused    Focused Attention Appears intact    Memory Appears intact    Awareness Appears intact    Problem Solving Appears intact      Sensation   Light Touch Appears Intact    Additional Comments Numbness into both hands. History of bilateral carpel tunnel      Coordination   Gross Motor Movements are Fluid and Coordinated Yes    Fine Motor Movements are Fluid and  Coordinated Yes      Functional Tests   Functional tests Squat;Single leg stance      Squat   Comments can perfrom mini suqat with good control      Single Leg Stance   Comments limited single leg stance on the left      PROM   Overall PROM Comments full passivre      Strength   Overall Strength Comments right side 5/5  Left Hip Flexion 4+/5    Left Hip ABduction 4+/5    Left Hip ADduction 4+/5      Palpation   Palpation comment mild tenderness to palpation in the right gluteal and lumbar spine      Transfers   Comments able to stand without use of hands      Ambulation/Gait   Gait Comments mild trendelenburgh; decreased left single leg stance on the left                        Objective measurements completed on examination: See above findings.       [facility] Adult PT Treatment/Exercise - 11/12/20 0001       Exercises   Exercises Knee/Hip      Knee/Hip Exercises: Standing   Heel Raises Limitations working on weight shift across the toes x20    Hip Flexion Limitations slow march with cuing to keep pelvis stable      Knee/Hip Exercises: Supine   Other Supine Knee/Hip Exercises bridge 2x10                      PT Short Term Goals - 11/12/20 0953       PT SHORT TERM GOAL #1   Title Patient will increase left single leg stance to 5/5    Time 3    Period Weeks    Status New    Target Date 12/03/20      PT SHORT TERM GOAL #2   Title Patient will increase left gross LE  strength to 5/5    Time 4    Period Weeks    Status New    Target Date 12/10/20      PT SHORT TERM GOAL #3   Title Patient will be indepdnent with basic HEP    Time 4    Period Weeks    Status New    Target Date 12/10/20               PT Long Term Goals - 11/12/20 0956       PT LONG TERM GOAL #1   Title Pateitn will return to walking for exercises without any increase in pain    Time 6    Period Weeks    Status New    Target Date 12/24/20       PT LONG TERM GOAL #2   Title Patient will self report standing for an hour without pain in order to return to work.    Time 6    Period Weeks    Status New    Target Date 12/24/20                    Plan - 11/11/20 1431     Clinical Impression Statement Patient is a 75 year old female S/P rleft anterior hip replacement on 09/17/2020. She is doing very well att this time. She has full range of motion and only mild strength defcitis. She has single leg stance instability and a mild trendelenburgh when she walks. She reports decreased ability to ambualte for exercises compared to her baseline. She is very active and would like to return to walking for exercise. Patient plans on returning to work at [employer] where she will be on her feet for periods of time.    Personal Factors and Comorbidities Comorbidity 1    Comorbidities lumbar fusion 2017    Examination-Activity Limitations Locomotion Level;Squat;Stairs;Stand  Examination-Participation Restrictions Cleaning;Community Activity;Shop;School;Occupation    Stability/Clinical Decision Making Stable/Uncomplicated    Clinical Decision Making Low    Rehab Potential Good    PT Frequency 1x / week    PT Duration 6 weeks    PT Treatment/Interventions ADLs/Self Care Home Management;Moist Heat;Cryotherapy;Electrical Stimulation;Traction;Ultrasound;Stair training;Functional mobility training;Therapeutic activities;Gait training;Therapeutic exercise;Neuromuscular re-education;Patient/family education;Manual techniques;Passive range of motion;Taping;Aquatic Therapy    PT Next Visit Plan work on gait training; consider moderate to higher level exercises if tolerated. consder steps; light leg press and mini squats at some point; review HEP; modalities PRN. consider clamshell; patient reports a feeling of hip fleor tightness; may benefit from stretching. Patient had anterior approach;    PT Home Exercise Plan ccess Code: [email or web address]  URL:  [email or web address]  Date: 11/11/2020  Prepared by: Carolyne Littles    Exercises  Standing March with Counter Support - 1 x daily - 7 x weekly - 3 sets - 10 reps  Heel rises with counter support - 1 x daily - 7 x weekly - 3 sets - 10 reps  Supine Bridge - 1 x daily - 7 x weekly - 3 sets - 10 reps  Standing Upper Trapezius Mobilization with Small Ball - 1 x daily - 7 x weekly - 3 sets - 10 reps    Consulted and Agree with Plan of Care Patient             Patient will benefit from skilled therapeutic intervention in order to improve the following deficits and impairments:  Abnormal gait, Decreased endurance, Increased muscle spasms, Pain, Decreased activity tolerance, Difficulty walking  Visit Diagnosis: Pain in left hip  Other abnormalities of gait and mobility     Problem List Patient Active Problem List   Diagnosis Date Noted   S/P left total hip arthroplasty 09/17/2020   Pre-operative cardiovascular examination 09/03/2020   Arthritis    Colon polyps    GERD (gastroesophageal reflux disease)    Hypertension    Spinal stenosis    Short sleeper 04/29/2020   Snoring 04/29/2020   Insomnia 03/06/2020   Abnormal glucose 03/06/2020   Sacroiliitis ([place]) 08/02/2019   Sensorineural hearing loss (SNHL) of both ears 07/05/2019   Tinnitus aurium, bilateral 07/05/2019   Radial tunnel syndrome, right 11/04/2016   Lumbar radiculopathy 01/09/2016   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 11/26/2015   Age-related osteoporosis without current pathological fracture 11/22/2015   Osteopenia determined by x-ray 11/22/2015   Allergic rhinitis due to pollen 08/19/2015   GAD (generalized anxiety disorder) 08/19/2015   LVH (left ventricular 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 05/23/2015   Chronic pain syndrome 04/01/2015   Non-rheumatic mitral regurgitation 03/21/2015   History of Bell's palsy 03/21/2015   Other intervertebral disc degeneration, lumbar  region 01/29/2015   Spinal stenosis, lumbar region without neurogenic claudication 11/22/2014   Radiculopathy, lumbar region 11/22/2014   Cervical disc disorder 05/10/2009   Female cystocele 02/27/2009   Hx of adenomatous colonic polyps 12/20/2008   Celiac disease 07/12/2006   Left bundle-branch block 05/20/2006   Chronic cough 02/15/2006   Screening for colon cancer 04/17/2005   Unspecified asthma, uncomplicated 56/25/6389   Incomplete uterovaginal prolapse 03/04/2004   Primary osteoarthritis, right hand 02/28/2002   Carpal tunnel syndrome, bilateral 02/28/2002   Trigger thumb, unspecified thumb 02/28/2002   Rhinitis, allergic 02/24/2002   Essential hypertension 07/20/2000    Carney Living PT DPT  11/12/2020, 11:19 AM  [facility] [place], [place],  [address] Phone: [phone]   Fax:  [phone]  Name: Deanna [name] MRN: [id number] Date of Birth: 02/03/1946

## 2020-11-19 ENCOUNTER — Other Ambulatory Visit: Payer: Self-pay

## 2020-11-19 ENCOUNTER — Ambulatory Visit (HOSPITAL_BASED_OUTPATIENT_CLINIC_OR_DEPARTMENT_OTHER): Payer: MEDICARE | Admitting: Physical Therapy

## 2020-11-19 DIAGNOSIS — M25552 Pain in left hip: Secondary | ICD-10-CM

## 2020-11-19 DIAGNOSIS — R2689 Other abnormalities of gait and mobility: Secondary | ICD-10-CM

## 2020-11-20 ENCOUNTER — Encounter (HOSPITAL_BASED_OUTPATIENT_CLINIC_OR_DEPARTMENT_OTHER): Payer: Self-pay | Admitting: Physical Therapy

## 2020-11-20 NOTE — Therapy (Signed)
[facility] [address] [place], [place], [address] Phone: [phone]   Fax:  [phone]  Physical Therapy Treatment  Patient Details  Name: Deanna [name] MRN: [id number] Date of Birth: March 23, 1946 Referring Provider (PT): Dr Paralee Cancel   Encounter Date: 11/19/2020   PT End of Session - 11/19/20 1014     Visit Number 2    Number of Visits 6    Date for PT Re-Evaluation 12/24/20    Authorization Type [employer] progress note at 10 visits; KX at 15 visits.    Authorization - Visit Number 1    PT Start Time [phone]    PT Stop Time 3716    PT Time Calculation (min) 38 min    Activity Tolerance Patient tolerated treatment well    Behavior During Therapy [facility] for tasks assessed/performed             Past Medical History:  Diagnosis Date   Abnormal glucose 03/06/2020   Age-related osteoporosis without current pathological fracture 11/22/2015   Overview:  Femoral neck  Formatting of this note might be different from the original. 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 Added automatically from request for surgery [id number]   Chronic pain syndrome 04/01/2015   Colon polyps    Essential hypertension 07/20/2000   Overview:  HBP   Female cystocele 02/27/2009   GAD (generalized anxiety disorder) 08/19/2015   GERD (gastroesophageal reflux disease)    History of Bell's palsy 03/21/2015   Hx of adenomatous colonic polyps 12/20/2008   Overview:  Colonoscopy 07/2003 - LMD - polyps removed - recommended repeat in 3 years Colonoscopy 09/2011 - Dr. Marian Sorrow - hyperplastic polyp(s) were removed - repeat in 5 years   Hypertension    Incomplete uterovaginal prolapse  03/04/2004   Insomnia    Left bundle-branch block 05/20/2006   Lumbar radiculopathy 01/09/2016   Formatting of this note might be different from the original. Added automatically from request for surgery [id number]   LVH (left ventricular 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 05/23/2015   Non-rheumatic mitral regurgitation 03/21/2015   Osteopenia determined by x-ray 11/22/2015   Formatting of this note might be different from the original. Femoral neck   Other intervertebral disc degeneration, lumbar region 01/29/2015   Primary osteoarthritis, right hand 02/28/2002   Overview:  OA HANDS, ESP THUMBS   Radial tunnel syndrome, right 11/04/2016   Radiculopathy, lumbar region 11/22/2014   Overview:  Added automatically from request for surgery [id number]   Rhinitis, allergic 02/24/2002   Overview:  ALLERGY SYMPTOMS - states was seen by allergist in past and informed allergic to dust   Sacroiliitis ([place]) 08/02/2019   Screening for colon cancer 04/17/2005   Formatting of this note might be different from the original. 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 TRIGGER THUMBS - started spring 2003.  Injected 02/28/02    Past Surgical History:  Procedure Laterality Date   ABDOMINAL HYSTERECTOMY  2010   [place]   SPINAL FUSION  01/2016   TONSILLECTOMY AND ADENOIDECTOMY  1956   TOTAL HIP ARTHROPLASTY Left 09/17/2020   Procedure: TOTAL HIP ARTHROPLASTY ANTERIOR APPROACH;  Surgeon: Paralee Cancel, MD;  Location: [facility];  Service: Orthopedics;  Laterality: Left;  70 mins    There were no vitals filed for this visit.   Subjective  Assessment - 11/19/20 0945     Subjective Pt states things are going well. She has no pain, just tightness this morning.    Currently in Pain? No/denies    Pain Onset 1 to 4 weeks ago                               [facility] Adult PT Treatment/Exercise - 11/20/20 0001       Ambulation/Gait   Gait Comments pt ambulating with slow cadence, dereased trunk rotation and hip extension noted. PT provided verbal cues and demonstration to increase patient's cadence, this resulted in improvements in Lt glute activation and hip extension as well as trunk rotation. Pt reported this felt "much better"      Knee/Hip Exercises: Stretches   Hip Flexor Stretch Left;3 reps;20 seconds    Hip Flexor Stretch Limitations at stairs      Knee/Hip Exercises: Standing   SLS with Vectors Rt toe tap side/back 5x3 sec hold- BUE support    Other Standing Knee Exercises Parallel bars: forward step over hurdle with intermittent UE support forward and side step 2x10 reps-seated rest break between    Other Standing Knee Exercises wall march with Rt LE/UE flexion to enourage Lt glute activation 2x10 reps      Knee/Hip Exercises: Supine   Bridges Strengthening;Both;2 sets;5 reps    Bridges Limitations Lt LE closer to body                    PT Education - 11/20/20 0759     Education Details technique with therex; gait mechanics    Shanker(s) Educated Patient    Methods Explanation;Verbal cues    Comprehension Verbalized understanding;Returned demonstration              PT Short Term Goals - 11/20/20 0801       PT SHORT TERM GOAL #1   Title Patient will increase left single leg stance to 5/5    Time 3    Period Weeks    Status New    Target Date 12/03/20      PT SHORT TERM GOAL #2   Title Patient will increase left gross LE  strength to 5/5    Time 4    Period Weeks    Status New    Target Date 12/10/20      PT SHORT TERM GOAL #3   Title Patient will be indepdnent with  basic HEP    Time 4    Period Weeks    Status Achieved    Target Date 12/10/20               PT Long Term Goals - 11/12/20 0956       PT LONG TERM GOAL #1   Title Pateitn will return to walking for exercises without any increase in pain    Time 6    Period Weeks  Status New    Target Date 12/24/20      PT LONG TERM GOAL #2   Title Patient will self report standing for an hour without pain in order to return to work.    Time 6    Period Weeks    Status New    Target Date 12/24/20                   Plan - 11/19/20 1017     Clinical Impression Statement Pt arrived with complaints of primarily stiffness in the Lt hip. She has been completing her HEP as instructed and denies any issues with this. Session focused heavily on increasing weight acceptance on the Lt in standing. Pt required PT verbal cuing to increase Lt glute activation in standing, but this was greatly improved with wall marches. PT encouraged pt to ambulate with increased cadence and this lead to increased step length bilaterally. Pt noted glute fatigue by the end of the session, but she denied pain.    Personal Factors and Comorbidities Comorbidity 1    Comorbidities lumbar fusion 2017    Examination-Activity Limitations Locomotion Level;Squat;Stairs;Stand    Examination-Participation Restrictions Cleaning;Community Activity;Shop;School;Occupation    Stability/Clinical Decision Making Stable/Uncomplicated    Rehab Potential Good    PT Frequency 1x / week    PT Duration 6 weeks    PT Treatment/Interventions ADLs/Self Care Home Management;Moist Heat;Cryotherapy;Electrical Stimulation;Traction;Ultrasound;Stair training;Functional mobility training;Therapeutic activities;Gait training;Therapeutic exercise;Neuromuscular re-education;Patient/family education;Manual techniques;Passive range of motion;Taping;Aquatic Therapy    PT Next Visit Plan continue to work on gait training; progress moderate to higher  level exercises if tolerated. light leg press and mini squats at some point; update HEP; Patient had anterior approach;    PT Home Exercise Plan ccess Code: [email or web address]  URL: [email or web address]  Date: 11/11/2020  Prepared by: Carolyne Littles    Exercises  Standing March with Counter Support - 1 x daily - 7 x weekly - 3 sets - 10 reps  Heel rises with counter support - 1 x daily - 7 x weekly - 3 sets - 10 reps  Supine Bridge - 1 x daily - 7 x weekly - 3 sets - 10 reps  Standing Upper Trapezius Mobilization with Small Ball - 1 x daily - 7 x weekly - 3 sets - 10 reps    Consulted and Agree with Plan of Care Patient             Patient will benefit from skilled therapeutic intervention in order to improve the following deficits and impairments:  Abnormal gait, Decreased endurance, Increased muscle spasms, Pain, Decreased activity tolerance, Difficulty walking  Visit Diagnosis: Pain in left hip  Other abnormalities of gait and mobility     Problem List Patient Active Problem List   Diagnosis Date Noted   S/P left total hip arthroplasty 09/17/2020   Pre-operative cardiovascular examination 09/03/2020   Arthritis    Colon polyps    GERD (gastroesophageal reflux disease)    Hypertension    Spinal stenosis    Short sleeper 04/29/2020   Snoring 04/29/2020   Insomnia 03/06/2020   Abnormal glucose 03/06/2020   Sacroiliitis ([place]) 08/02/2019   Sensorineural hearing loss (SNHL) of both ears 07/05/2019   Tinnitus aurium, bilateral 07/05/2019   Radial tunnel syndrome, right 11/04/2016   Lumbar radiculopathy 01/09/2016   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 11/26/2015   Age-related osteoporosis without current pathological fracture 11/22/2015   Osteopenia determined by x-ray 11/22/2015   Allergic rhinitis  due to pollen 08/19/2015   GAD (generalized anxiety disorder) 08/19/2015   LVH (left ventricular 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 05/23/2015    Chronic pain syndrome 04/01/2015   Non-rheumatic mitral regurgitation 03/21/2015   History of Bell's palsy 03/21/2015   Other intervertebral disc degeneration, lumbar region 01/29/2015   Spinal stenosis, lumbar region without neurogenic claudication 11/22/2014   Radiculopathy, lumbar region 11/22/2014   Cervical disc disorder 05/10/2009   Female cystocele 02/27/2009   Hx of adenomatous colonic polyps 12/20/2008   Celiac disease 07/12/2006   Left bundle-branch block 05/20/2006   Chronic cough 02/15/2006   Screening for colon cancer 04/17/2005   Unspecified asthma, uncomplicated 82/10/154   Incomplete uterovaginal prolapse 03/04/2004   Primary osteoarthritis, right hand 02/28/2002   Carpal tunnel syndrome, bilateral 02/28/2002   Trigger thumb, unspecified thumb 02/28/2002   Rhinitis, allergic 02/24/2002   Essential hypertension 07/20/2000   8:06 AM,11/20/20 Sherol Dade PT, DPT [facility] [place], [place], [address] Phone: [phone]   Fax:  [phone]  Name: Wilmina Maxham MRN: [id number] Date of Birth: 1945/08/08

## 2020-11-25 ENCOUNTER — Encounter (HOSPITAL_BASED_OUTPATIENT_CLINIC_OR_DEPARTMENT_OTHER): Payer: Self-pay | Admitting: Physical Therapy

## 2020-11-25 ENCOUNTER — Ambulatory Visit (HOSPITAL_BASED_OUTPATIENT_CLINIC_OR_DEPARTMENT_OTHER): Payer: MEDICARE | Admitting: Physical Therapy

## 2020-11-25 ENCOUNTER — Other Ambulatory Visit: Payer: Self-pay

## 2020-11-25 DIAGNOSIS — R2689 Other abnormalities of gait and mobility: Secondary | ICD-10-CM

## 2020-11-25 DIAGNOSIS — M25552 Pain in left hip: Secondary | ICD-10-CM

## 2020-11-26 ENCOUNTER — Ambulatory Visit: Payer: MEDICARE | Admitting: Psychology

## 2020-11-26 ENCOUNTER — Encounter (HOSPITAL_BASED_OUTPATIENT_CLINIC_OR_DEPARTMENT_OTHER): Payer: Self-pay | Admitting: Physical Therapy

## 2020-11-26 NOTE — Therapy (Signed)
[facility] [address] [place], [place], [address] Phone: [phone]   Fax:  [phone]  Physical Therapy Treatment  Patient Details  Name: Deanna [name] MRN: [id number] Date of Birth: [birth year] Referring Provider (PT): Dr Paralee Cancel   Encounter Date: 11/25/2020   PT End of Session - 11/25/20 1522     Visit Number 3    Number of Visits 6    Date for PT Re-Evaluation 12/24/20    Authorization Type [employer] progress note at 10 visits; KX at 15 visits.    PT Start Time 6237    PT Stop Time 1518    PT Time Calculation (min) 41 min    Activity Tolerance Patient tolerated treatment well    Behavior During Therapy [facility] for tasks assessed/performed             Past Medical History:  Diagnosis Date   Abnormal glucose 03/06/2020   Age-related osteoporosis without current pathological fracture 11/22/2015   Overview:  Femoral neck  Formatting of this note might be different from the original. 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 Added automatically from request for surgery [id number]   Chronic pain syndrome 04/01/2015   Colon polyps    Essential hypertension 07/20/2000   Overview:  HBP   Female cystocele 02/27/2009   GAD (generalized anxiety disorder) 08/19/2015   GERD (gastroesophageal reflux disease)    History of Bell's palsy 03/21/2015   Hx of adenomatous colonic polyps 12/20/2008   Overview:  Colonoscopy 07/2003 - LMD - polyps removed - recommended repeat in 3 years Colonoscopy 09/2011 - Dr. Marian Sorrow - hyperplastic polyp(s) were removed - repeat in 5 years   Hypertension    Incomplete uterovaginal prolapse 03/04/2004   Insomnia    Left  bundle-branch block 05/20/2006   Lumbar radiculopathy 01/09/2016   Formatting of this note might be different from the original. Added automatically from request for surgery [id number]   LVH (left ventricular 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 05/23/2015   Non-rheumatic mitral regurgitation 03/21/2015   Osteopenia determined by x-ray 11/22/2015   Formatting of this note might be different from the original. Femoral neck   Other intervertebral disc degeneration, lumbar region 01/29/2015   Primary osteoarthritis, right hand 02/28/2002   Overview:  OA HANDS, ESP THUMBS   Radial tunnel syndrome, right 11/04/2016   Radiculopathy, lumbar region 11/22/2014   Overview:  Added automatically from request for surgery [id number]   Rhinitis, allergic 02/24/2002   Overview:  ALLERGY SYMPTOMS - states was seen by allergist in past and informed allergic to dust   Sacroiliitis ([place]) 08/02/2019   Screening for colon cancer 04/17/2005   Formatting of this note might be different from the original. 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 TRIGGER THUMBS - started spring 2003. Injected 02/28/02    Past Surgical History:  Procedure Laterality Date   ABDOMINAL HYSTERECTOMY  2010   [place]   SPINAL FUSION  01/2016   TONSILLECTOMY AND ADENOIDECTOMY  1956   TOTAL HIP ARTHROPLASTY Left 09/17/2020   Procedure: TOTAL HIP ARTHROPLASTY ANTERIOR APPROACH;  Surgeon: Paralee Cancel, MD;  Location: [facility];  Service: Orthopedics;  Laterality: Left;  70 mins    There were no vitals filed for this visit.   Subjective Assessment - 11/25/20 1440      Subjective Pt states she is feeling well. She reports no pain except for soreness after last session. She reports that she fatigues easily and want to increase strength to not get as tired as easily.    Pertinent History HIP replacement    Currently in Pain? No/denies    Multiple Pain Sites No                   [facility] Adult PT Treatment/Exercise - 11/26/20 0001       Ambulation/Gait   Gait Comments PT provided verbal cues and demonstration to increase patient's cadence, this resulted in improvements in Lt glute activation and hip extension as well as trunk rotation.      Knee/Hip Exercises: Stretches   Active Hamstring Stretch Left;3 reps;20 seconds    Hip Flexor Stretch Left;3 reps;20 seconds      Knee/Hip Exercises: Aerobic   Nustep 4 mins, 2 speed      Knee/Hip Exercises: Standing   Hip Flexion Limitations slow march with cuing to keep pelvis stable    Other Standing Knee Exercises Parallel bars: forward step over hurdle with intermittent UE support forward and side step 3x10 reps-seated rest break between    Other Standing Knee Exercises double leg calf raise 2x10      Knee/Hip Exercises: Supine   Bridges Strengthening;Both;2 sets;10 reps    Bridges Limitations Lt LE closer to body    Other Supine Knee/Hip Exercises clamshells 2x10, with red band                    PT Education - 11/25/20 1445     Education Details Reviewed HEP and gait mechanics    [place](s) Educated Patient    Methods Explanation;Verbal cues    Comprehension Verbalized understanding;Returned demonstration              PT Short Term Goals - 11/20/20 0801       PT SHORT TERM GOAL #1   Title Patient will increase left single leg stance to 5/5    Time 3    Period Weeks    Status New    Target Date 12/03/20      PT SHORT TERM GOAL #2   Title Patient will increase left gross LE  strength to 5/5    Time 4    Period Weeks    Status New    Target Date 12/10/20      PT SHORT  TERM GOAL #3   Title Patient will be indepdnent with basic HEP    Time 4    Period Weeks    Status Achieved    Target Date 12/10/20               PT Long Term Goals - 11/12/20 0956       PT LONG TERM GOAL #1   Title Pateitn will return to walking for exercises without any increase in pain    Time 6  Period Weeks    Status New    Target Date 12/24/20      PT LONG TERM GOAL #2   Title Patient will self report standing for an hour without pain in order to return to work.    Time 6    Period Weeks    Status New    Target Date 12/24/20                   Plan - 11/25/20 1525     Clinical Impression Statement Pt continues to progress with HEP focused on correct gait mechanics, hip strengthing, and stretches. Therapy focused on increasing load on L hip while walking and emphazing correct muscle activation. Patient completed mutiple cycles of hurdles lateral and forward to improve ambulation. Patient noted fatigue at the end of the session, but stated that she felt no pain and felt she was getting stronger. Patient should continue to progress HEP with emphasis on stair training during the next visit.    Personal Factors and Comorbidities Comorbidity 1    Examination-Activity Limitations Locomotion Level;Squat;Stairs;Stand    Examination-Participation Restrictions Cleaning;Community Activity;Shop;School;Occupation    Stability/Clinical Decision Making Stable/Uncomplicated    Clinical Decision Making Low    Rehab Potential Good    PT Frequency 1x / week    PT Duration 6 weeks    PT Treatment/Interventions ADLs/Self Care Home Management;Moist Heat;Cryotherapy;Electrical Stimulation;Traction;Ultrasound;Stair training;Functional mobility training;Therapeutic activities;Gait training;Therapeutic exercise;Neuromuscular re-education;Patient/family education;Manual techniques;Passive range of motion;Taping;Aquatic Therapy    PT Next Visit Plan continue to work on gait training;  progress moderate to higher level exercises if tolerated. Stair training light leg press and mini squats at some point; update HEP; Patient had anterior approach;    PT Home Exercise Plan ccess Code: [email or web address]  URL: [email or web address]  Date: 11/11/2020  Prepared by: Carolyne Littles    Exercises  Standing March with Counter Support - 1 x daily - 7 x weekly - 3 sets - 10 reps  Heel rises with counter support - 1 x daily - 7 x weekly - 3 sets - 10 reps  Supine Bridge - 1 x daily - 7 x weekly - 3 sets - 10 reps  Standing Upper Trapezius Mobilization with Small Ball - 1 x daily - 7 x weekly - 3 sets - 10 reps, Supine Clam shells 3x10    Consulted and Agree with Plan of Care Patient             Patient will benefit from skilled therapeutic intervention in order to improve the following deficits and impairments:  Abnormal gait, Decreased endurance, Increased muscle spasms, Pain, Decreased activity tolerance, Difficulty walking  Visit Diagnosis: Pain in left hip  Other abnormalities of gait and mobility     Problem List Patient Active Problem List   Diagnosis Date Noted   S/P left total hip arthroplasty 09/17/2020   Pre-operative cardiovascular examination 09/03/2020   Arthritis    Colon polyps    GERD (gastroesophageal reflux disease)    Hypertension    Spinal stenosis    Short sleeper 04/29/2020   Snoring 04/29/2020   Insomnia 03/06/2020   Abnormal glucose 03/06/2020   Sacroiliitis ([place]) 08/02/2019   Sensorineural hearing loss (SNHL) of both ears 07/05/2019   Tinnitus aurium, bilateral 07/05/2019   Radial tunnel syndrome, right 11/04/2016   Lumbar radiculopathy 01/09/2016   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 11/26/2015   Age-related osteoporosis without current pathological fracture 11/22/2015   Osteopenia determined by x-ray 11/22/2015  Allergic rhinitis due to pollen 08/19/2015   GAD (generalized anxiety disorder) 08/19/2015   LVH (left  ventricular 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 05/23/2015   Chronic pain syndrome 04/01/2015   Non-rheumatic mitral regurgitation 03/21/2015   History of Bell's palsy 03/21/2015   Other intervertebral disc degeneration, lumbar region 01/29/2015   Spinal stenosis, lumbar region without neurogenic claudication 11/22/2014   Radiculopathy, lumbar region 11/22/2014   Cervical disc disorder 05/10/2009   Female cystocele 02/27/2009   Hx of adenomatous colonic polyps 12/20/2008   Celiac disease 07/12/2006   Left bundle-branch block 05/20/2006   Chronic cough 02/15/2006   Screening for colon cancer 04/17/2005   Unspecified asthma, uncomplicated 32/95/1884   Incomplete uterovaginal prolapse 03/04/2004   Primary osteoarthritis, right hand 02/28/2002   Carpal tunnel syndrome, bilateral 02/28/2002   Trigger thumb, unspecified thumb 02/28/2002   Rhinitis, allergic 02/24/2002   Essential hypertension 07/20/2000   Carolyne Littles PT DPT  11/26/2020   During this treatment session, the therapist was present, participating in and directing the treatment.   Billey Co SPT 11/26/2020, 9:49 AM  [facility] [place], [place], [address] Phone: [phone]   Fax:  [phone]  Name: Deanna [name] MRN: [id number] Date of Birth: Nov 25, 1945

## 2020-11-29 ENCOUNTER — Ambulatory Visit
Admission: RE | Admit: 2020-11-29 | Discharge: 2020-11-29 | Disposition: A | Discharge status: Home / Self Care | Payer: MEDICARE | Source: Ambulatory Visit | Attending: Nurse Practitioner | Admitting: Nurse Practitioner

## 2020-11-29 ENCOUNTER — Other Ambulatory Visit: Payer: Self-pay

## 2020-11-29 DIAGNOSIS — E2839 Other primary ovarian failure: Secondary | ICD-10-CM

## 2020-12-03 ENCOUNTER — Other Ambulatory Visit: Payer: Self-pay

## 2020-12-03 ENCOUNTER — Encounter (HOSPITAL_BASED_OUTPATIENT_CLINIC_OR_DEPARTMENT_OTHER): Payer: Self-pay | Admitting: Physical Therapy

## 2020-12-03 ENCOUNTER — Ambulatory Visit (HOSPITAL_BASED_OUTPATIENT_CLINIC_OR_DEPARTMENT_OTHER): Payer: MEDICARE | Attending: Orthopedic Surgery | Admitting: Physical Therapy

## 2020-12-03 DIAGNOSIS — M25552 Pain in left hip: Secondary | ICD-10-CM | POA: Diagnosis not present

## 2020-12-03 DIAGNOSIS — R2689 Other abnormalities of gait and mobility: Secondary | ICD-10-CM | POA: Diagnosis present

## 2020-12-04 ENCOUNTER — Encounter (HOSPITAL_BASED_OUTPATIENT_CLINIC_OR_DEPARTMENT_OTHER): Payer: Self-pay | Admitting: Physical Therapy

## 2020-12-04 NOTE — Therapy (Signed)
[facility] [address] [place], [place], [address] Phone: [phone]   Fax:  [phone]  Physical Therapy Treatment  Patient Details  Name: Deanna [name] MRN: [id number] Date of Birth: Oct 27, 1945 Referring Provider (PT): Dr Paralee Cancel   Encounter Date: 12/03/2020   PT End of Session - 12/03/20 1447     Visit Number 4    Number of Visits 6    Date for PT Re-Evaluation 12/24/20    Authorization Type [employer] progress note at 10 visits; KX at 15 visits.    PT Start Time 1430    PT Stop Time 1512    PT Time Calculation (min) 42 min    Activity Tolerance Patient tolerated treatment well    Behavior During Therapy [facility] for tasks assessed/performed             Past Medical History:  Diagnosis Date   Abnormal glucose 03/06/2020   Age-related osteoporosis without current pathological fracture 11/22/2015   Overview:  Femoral neck  Formatting of this note might be different from the original. 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 Added automatically from request for surgery [id number]   Chronic pain syndrome 04/01/2015   Colon polyps    Essential hypertension 07/20/2000   Overview:  HBP   Female cystocele 02/27/2009   GAD (generalized anxiety disorder) 08/19/2015   GERD (gastroesophageal reflux disease)    History of Bell's palsy 03/21/2015   Hx of adenomatous colonic polyps 12/20/2008   Overview:  Colonoscopy 07/2003 - LMD - polyps removed - recommended repeat in 3 years Colonoscopy 09/2011 - Dr. Marian Sorrow - hyperplastic polyp(s) were removed - repeat in 5 years   Hypertension    Incomplete uterovaginal prolapse 03/04/2004   Insomnia    Left  bundle-branch block 05/20/2006   Lumbar radiculopathy 01/09/2016   Formatting of this note might be different from the original. Added automatically from request for surgery [phone]   LVH (left ventricular 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 05/23/2015   Non-rheumatic mitral regurgitation 03/21/2015   Osteopenia determined by x-ray 11/22/2015   Formatting of this note might be different from the original. Femoral neck   Other intervertebral disc degeneration, lumbar region 01/29/2015   Primary osteoarthritis, right hand 02/28/2002   Overview:  OA HANDS, ESP THUMBS   Radial tunnel syndrome, right 11/04/2016   Radiculopathy, lumbar region 11/22/2014   Overview:  Added automatically from request for surgery [id number]   Rhinitis, allergic 02/24/2002   Overview:  ALLERGY SYMPTOMS - states was seen by allergist in past and informed allergic to dust   Sacroiliitis ([place]) 08/02/2019   Screening for colon cancer 04/17/2005   Formatting of this note might be different from the original. 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 TRIGGER THUMBS - started spring 2003. Injected 02/28/02    Past Surgical  History:  Procedure Laterality Date   ABDOMINAL HYSTERECTOMY  2010   [place]   SPINAL FUSION  01/2016   TONSILLECTOMY AND ADENOIDECTOMY  1956   TOTAL HIP ARTHROPLASTY Left 09/17/2020   Procedure: TOTAL HIP ARTHROPLASTY ANTERIOR APPROACH;  Surgeon: Paralee Cancel, MD;  Location: [facility];  Service: Orthopedics;  Laterality: Left;  70 mins    There were no vitals filed for this visit.   Subjective Assessment - 12/03/20 1436      Subjective Patient reports discomfort from tightness and stiffness. Patient reports no pain over the weekend but does feel sore with increased standing such as cooking or cleaning. Walking is similar but not as much as standing.    Pertinent History HIP replacement    Limitations Walking;Standing    How long can you stand comfortably? 30-40    How long can you walk comfortably? 45 mins    Currently in Pain? No/denies    Pain Location --    Pain Orientation --    Pain Descriptors / Indicators --    Multiple Pain Sites No                               [facility] Adult PT Treatment/Exercise - 12/04/20 0001       Ambulation/Gait   Gait Comments PT provided verbal cues and demonstration to increase patient's gait mechanics.      Knee/Hip Exercises: Stretches   Active Hamstring Stretch Left;3 reps;20 seconds    Active Hamstring Stretch Limitations reviewed on step    Other Knee/Hip Stretches anterior hip stretch reviewed in standing for home 3x20 sec hold.      Knee/Hip Exercises: Aerobic   Nustep 5 min L2      Knee/Hip Exercises: Standing   Hip Flexion Limitations slow march with cuing to keep pelvis stable    Other Standing Knee Exercises Parallel bars: forward step over hurdle with intermittent UE support forward and side step 3x10 reps-seated rest break between; step up 2x10 4 inch step    Other Standing Knee Exercises double leg calf raise 2x10      Knee/Hip Exercises: Supine   Bridges Strengthening;Both;2 sets;10 reps    Bridges Limitations Lt LE closer to body, with red band    Other Supine Knee/Hip Exercises clamshells 2x10, with red band                    PT Education - 12/03/20 1446     Education Details Reviewed HEP and stretches for hip flexor and hamstring.    [place](s) Educated Patient    Methods Explanation;Verbal cues    Comprehension Verbalized understanding;Returned demonstration              PT Short Term Goals - 11/20/20 0801        PT SHORT TERM GOAL #1   Title Patient will increase left single leg stance to 5/5    Time 3    Period Weeks    Status New    Target Date 12/03/20      PT SHORT TERM GOAL #2   Title Patient will increase left gross LE  strength to 5/5    Time 4    Period Weeks    Status New    Target Date 12/10/20      PT SHORT TERM GOAL #3   Title Patient will be indepdnent with basic HEP  Time 4    Period Weeks    Status Achieved    Target Date 12/10/20               PT Long Term Goals - 11/12/20 0956       PT LONG TERM GOAL #1   Title Pateitn will return to walking for exercises without any increase in pain    Time 6    Period Weeks    Status New    Target Date 12/24/20      PT LONG TERM GOAL #2   Title Patient will self report standing for an hour without pain in order to return to work.    Time 6    Period Weeks    Status New    Target Date 12/24/20                   Plan - 12/03/20 1445     Clinical Impression Statement Pt continues to progress with HEP with more challenging thera-ex's. Patient completed hip strengthen exercises: supine clam shells wth red band, red banded glute bridge. Therapy focused on improving load on L hip with lateral hurdle walks, forward step ups on box, and weighted shift. Patient completed HEP during session with decreased fatigue but still required rest between walking exercises. Patient felt tightness on area of surgery but responded well to a hip flexor stretch and stated no increase in pain during session. Patient should continue to progress HEP with precaution on any anterior hip discomfort.    Personal Factors and Comorbidities Comorbidity 1    Comorbidities lumbar fusion 2017    Examination-Activity Limitations Locomotion Level;Squat;Stairs;Stand    Examination-Participation Restrictions Cleaning;Community Activity;Shop;School;Occupation    Stability/Clinical Decision Making Stable/Uncomplicated    Clinical Decision  Making Low    Rehab Potential Good    PT Frequency 1x / week    PT Duration 6 weeks    PT Treatment/Interventions ADLs/Self Care Home Management;Moist Heat;Cryotherapy;Electrical Stimulation;Traction;Ultrasound;Stair training;Functional mobility training;Therapeutic activities;Gait training;Therapeutic exercise;Neuromuscular re-education;Patient/family education;Manual techniques;Passive range of motion;Taping;Aquatic Therapy    PT Next Visit Plan continue to work on gait training; progress moderate to higher level exercises if tolerated. Stair training light leg press and mini squats at some point; update HEP; Patient had anterior approach;    PT Home Exercise Plan Access Code: [email or web address],  quad sets 2x10, supine clam shells 3x10, banded bridge 3x10 with red band, lateral and forward hurdle walks 2x10, step ups 2x10, weighted shift 2x10, standing hip flexor stretch and hamstring stretch.    Consulted and Agree with Plan of Care Patient             Patient will benefit from skilled therapeutic intervention in order to improve the following deficits and impairments:  Abnormal gait, Decreased endurance, Increased muscle spasms, Pain, Decreased activity tolerance, Difficulty walking  Visit Diagnosis: Pain in left hip  Other abnormalities of gait and mobility     Problem List Patient Active Problem List   Diagnosis Date Noted   S/P left total hip arthroplasty 09/17/2020   Pre-operative cardiovascular examination 09/03/2020   Arthritis    Colon polyps    GERD (gastroesophageal reflux disease)    Hypertension    Spinal stenosis    Short sleeper 04/29/2020   Snoring 04/29/2020   Insomnia 03/06/2020   Abnormal glucose 03/06/2020   Sacroiliitis ([place]) 08/02/2019   Sensorineural hearing loss (SNHL) of both ears 07/05/2019   Tinnitus aurium, bilateral 07/05/2019   Radial tunnel syndrome, right 11/04/2016  Lumbar radiculopathy 01/09/2016   Chronic left-sided low back pain with  right-sided sciatica 11/26/2015   Age-related osteoporosis without current pathological fracture 11/22/2015   Osteopenia determined by x-ray 11/22/2015   Allergic rhinitis due to pollen 08/19/2015   GAD (generalized anxiety disorder) 08/19/2015   LVH (left ventricular 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 05/23/2015   Chronic pain syndrome 04/01/2015   Non-rheumatic mitral regurgitation 03/21/2015   History of Bell's palsy 03/21/2015   Other intervertebral disc degeneration, lumbar region 01/29/2015   Spinal stenosis, lumbar region without neurogenic claudication 11/22/2014   Radiculopathy, lumbar region 11/22/2014   Cervical disc disorder 05/10/2009   Female cystocele 02/27/2009   Hx of adenomatous colonic polyps 12/20/2008   Celiac disease 07/12/2006   Left bundle-branch block 05/20/2006   Chronic cough 02/15/2006   Screening for colon cancer 04/17/2005   Unspecified asthma, uncomplicated 49/20/1007   Incomplete uterovaginal prolapse 03/04/2004   Primary osteoarthritis, right hand 02/28/2002   Carpal tunnel syndrome, bilateral 02/28/2002   Trigger thumb, unspecified thumb 02/28/2002   Rhinitis, allergic 02/24/2002   Essential hypertension 07/20/2000    Carney Living PT DPT  12/04/2020, 1:40 PM  Geralyn Flash SPT 12/04/2020 1:40  During this treatment session, the therapist was present, participating in and directing the treatment.   [facility] [address] [place], [place], [address] Phone: [phone]   Fax:  [phone]  Name: Deanna [name] MRN: [id number] Date of Birth: Aug 03, 1945

## 2020-12-09 ENCOUNTER — Other Ambulatory Visit: Payer: Self-pay

## 2020-12-09 ENCOUNTER — Ambulatory Visit (HOSPITAL_BASED_OUTPATIENT_CLINIC_OR_DEPARTMENT_OTHER): Payer: MEDICARE | Admitting: Physical Therapy

## 2020-12-09 DIAGNOSIS — M25552 Pain in left hip: Secondary | ICD-10-CM | POA: Diagnosis not present

## 2020-12-09 DIAGNOSIS — R2689 Other abnormalities of gait and mobility: Secondary | ICD-10-CM

## 2020-12-10 NOTE — Therapy (Signed)
[facility] [address] [place], [place], [address] Phone: [phone]   Fax:  [phone]  Physical Therapy Treatment  Patient Details  Name: Deanna [name] MRN: [id number] Date of Birth: 19-Nov-1945 Referring Provider (PT): Dr Paralee Cancel   Encounter Date: 12/09/2020   PT End of Session - 12/09/20 1040     Visit Number 5    Number of Visits 6    Date for PT Re-Evaluation 12/24/20    Authorization Type [employer] progress note at 10 visits; KX at 15 visits.    PT Start Time 1022    PT Stop Time 1100    PT Time Calculation (min) 38 min    Activity Tolerance Patient tolerated treatment well    Behavior During Therapy WFL for tasks assessed/performed             Past Medical History:  Diagnosis Date   Abnormal glucose 03/06/2020   Age-related osteoporosis without current pathological fracture 11/22/2015   Overview:  Femoral neck  Formatting of this note might be different from the original. 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 Added automatically from request for surgery [id number]   Chronic pain syndrome 04/01/2015   Colon polyps    Essential hypertension 07/20/2000   Overview:  HBP   Female cystocele 02/27/2009   GAD (generalized anxiety disorder) 08/19/2015   GERD (gastroesophageal reflux disease)    History of Bell's palsy 03/21/2015   Hx of adenomatous colonic polyps 12/20/2008   Overview:  Colonoscopy 07/2003 - LMD - polyps removed - recommended repeat in 3 years Colonoscopy 09/2011 - Dr. Marian Sorrow - hyperplastic polyp(s) were removed - repeat in 5 years   Hypertension    Incomplete uterovaginal prolapse 03/04/2004   Insomnia    Left  bundle-branch block 05/20/2006   Lumbar radiculopathy 01/09/2016   Formatting of this note might be different from the original. Added automatically from request for surgery [id number]   LVH (left ventricular 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 05/23/2015   Non-rheumatic mitral regurgitation 03/21/2015   Osteopenia determined by x-ray 11/22/2015   Formatting of this note might be different from the original. Femoral neck   Other intervertebral disc degeneration, lumbar region 01/29/2015   Primary osteoarthritis, right hand 02/28/2002   Overview:  OA HANDS, ESP THUMBS   Radial tunnel syndrome, right 11/04/2016   Radiculopathy, lumbar region 11/22/2014   Overview:  Added automatically from request for surgery [id number]   Rhinitis, allergic 02/24/2002   Overview:  ALLERGY SYMPTOMS - states was seen by allergist in past and informed allergic to dust   Sacroiliitis ([place]) 08/02/2019   Screening for colon cancer 04/17/2005   Formatting of this note might be different from the original. 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 TRIGGER THUMBS - started spring 2003. Injected 02/28/02    Past Surgical  History:  Procedure Laterality Date   ABDOMINAL HYSTERECTOMY  2010   [place]   SPINAL FUSION  01/2016   TONSILLECTOMY AND ADENOIDECTOMY  1956   TOTAL HIP ARTHROPLASTY Left 09/17/2020   Procedure: TOTAL HIP ARTHROPLASTY ANTERIOR APPROACH;  Surgeon: Paralee Cancel, MD;  Location: [facility];  Service: Orthopedics;  Laterality: Left;  70 mins    There were no vitals filed for this visit.   Subjective Assessment - 12/09/20 1026      Subjective Patient reports feeling better at time of visit but notes stiffness and tightness in lower back after 2 days of work. She states that she felt more discomfort when standing but was able to get relief with sitting.    Pertinent History HIP replacement    Limitations Walking;Standing    How long can you stand comfortably? 30-40    How long can you walk comfortably? 45 mins    Currently in Pain? No/denies    Multiple Pain Sites No                               [facility] Adult PT Treatment/Exercise - 12/10/20 0001       Knee/Hip Exercises: Stretches   Active Hamstring Stretch Left;3 reps;20 seconds    Active Hamstring Stretch Limitations reviewed on step    Hip Flexor Stretch Left;3 reps;20 seconds    Other Knee/Hip Stretches anterior hip stretch reviewed in standing for home 3x20 sec hold.      Knee/Hip Exercises: Aerobic   Recumbent Bike 5 min L2      Knee/Hip Exercises: Standing   Heel Raises 2 sets;10 reps;Both    Forward Step Up Both;2 sets;10 reps    Forward Step Up Limitations 4 inch step    Functional Squat 2 sets;10 reps;Other (comment)    Functional Squat Limitations elevated seat      Knee/Hip Exercises: Supine   Quad Sets Strengthening;Both;2 sets;10 reps    Bridges with Clamshell Strengthening;Both;2 sets;10 reps    Bridges with Clamshell --   with greenband   Other Supine Knee/Hip Exercises clamshells 2x10, with green band                    PT Education - 12/09/20 1050     Education Details Reviwed HEP and more dynamic strengthing exercises to complete at home.    [place](s) Educated Patient    Methods Explanation;Demonstration    Comprehension Verbalized understanding;Returned demonstration              PT Short Term Goals - 11/20/20 0801       PT SHORT TERM GOAL #1   Title Patient will increase left single leg stance to 5/5    Time 3    Period Weeks    Status New    Target Date 12/03/20      PT SHORT TERM  GOAL #2   Title Patient will increase left gross LE  strength to 5/5    Time 4    Period Weeks    Status New    Target Date 12/10/20      PT SHORT TERM GOAL #3   Title Patient will be indepdnent with basic HEP    Time 4    Period Weeks    Status Achieved    Target Date 12/10/20  PT Long Term Goals - 11/12/20 0956       PT LONG TERM GOAL #1   Title Pateitn will return to walking for exercises without any increase in pain    Time 6    Period Weeks    Status New    Target Date 12/24/20      PT LONG TERM GOAL #2   Title Patient will self report standing for an hour without pain in order to return to work.    Time 6    Period Weeks    Status New    Target Date 12/24/20                   Plan - 12/09/20 1044     Clinical Impression Statement Pt continues to progress with HEP with more challenging thera-ex. Patient completed hip strengthen exercises with green band, stair training, and sit to stand with no increase in pain. Patient should continue to progress HEP to include emphasis on correct mechanics with gait and prolonged standing and continue strengthening.    Personal Factors and Comorbidities Comorbidity 1    Comorbidities lumbar fusion 2017    Examination-Activity Limitations Locomotion Level;Squat;Stairs;Stand    Examination-Participation Restrictions Cleaning;Community Activity;Shop;School;Occupation    Stability/Clinical Decision Making Stable/Uncomplicated    Clinical Decision Making Low    Rehab Potential Good    PT Frequency 1x / week    PT Duration 6 weeks    PT Treatment/Interventions ADLs/Self Care Home Management;Moist Heat;Cryotherapy;Electrical Stimulation;Traction;Ultrasound;Stair training;Functional mobility training;Therapeutic activities;Gait training;Therapeutic exercise;Neuromuscular re-education;Patient/family education;Manual techniques;Passive range of motion;Taping;Aquatic Therapy    PT Next Visit Plan continue to work  on gait training; progress moderate to higher level exercises if tolerated. Stair training and light leg press and mini squats; update HEP; Patient had anterior approach; Reassess patient goals, strength and ROM.    PT Home Exercise Plan Access Code: [email or web address],  quad sets 2x10, supine clam shells 3x10, banded bridge 3x10 with red band, lateral and forward hurdle walks 2x10, step ups 2x10, weighted shift 2x10, standing hip flexor stretch and hamstring stretch., 4 inch step up 2x10 for each leg, modified sit to stand 2x10.    Consulted and Agree with Plan of Care Patient             Patient will benefit from skilled therapeutic intervention in order to improve the following deficits and impairments:  Abnormal gait, Decreased endurance, Increased muscle spasms, Pain, Decreased activity tolerance, Difficulty walking  Visit Diagnosis: Pain in left hip  Other abnormalities of gait and mobility     Problem List Patient Active Problem List   Diagnosis Date Noted   S/P left total hip arthroplasty 09/17/2020   Pre-operative cardiovascular examination 09/03/2020   Arthritis    Colon polyps    GERD (gastroesophageal reflux disease)    Hypertension    Spinal stenosis    Short sleeper 04/29/2020   Snoring 04/29/2020   Insomnia 03/06/2020   Abnormal glucose 03/06/2020   Sacroiliitis ([place]) 08/02/2019   Sensorineural hearing loss (SNHL) of both ears 07/05/2019   Tinnitus aurium, bilateral 07/05/2019   Radial tunnel syndrome, right 11/04/2016   Lumbar radiculopathy 01/09/2016   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 11/26/2015   Age-related osteoporosis without current pathological fracture 11/22/2015   Osteopenia determined by x-ray 11/22/2015   Allergic rhinitis due to pollen 08/19/2015   GAD (generalized anxiety disorder) 08/19/2015   LVH (left ventricular 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 05/23/2015   Chronic pain syndrome  04/01/2015   Non-rheumatic mitral  regurgitation 03/21/2015   History of Bell's palsy 03/21/2015   Other intervertebral disc degeneration, lumbar region 01/29/2015   Spinal stenosis, lumbar region without neurogenic claudication 11/22/2014   Radiculopathy, lumbar region 11/22/2014   Cervical disc disorder 05/10/2009   Female cystocele 02/27/2009   Hx of adenomatous colonic polyps 12/20/2008   Celiac disease 07/12/2006   Left bundle-branch block 05/20/2006   Chronic cough 02/15/2006   Screening for colon cancer 04/17/2005   Unspecified asthma, uncomplicated 67/67/2094   Incomplete uterovaginal prolapse 03/04/2004   Primary osteoarthritis, right hand 02/28/2002   Carpal tunnel syndrome, bilateral 02/28/2002   Trigger thumb, unspecified thumb 02/28/2002   Rhinitis, allergic 02/24/2002   Essential hypertension 07/20/2000    Carney Living PT DPT  12/10/2020, 9:37 AM  [facility] [place], [place], [address] Phone: [phone]   Fax:  [phone]  Name: Deanna [name] MRN: [id number] Date of Birth: 10-05-1945

## 2020-12-16 ENCOUNTER — Other Ambulatory Visit: Payer: Self-pay

## 2020-12-16 ENCOUNTER — Ambulatory Visit (HOSPITAL_BASED_OUTPATIENT_CLINIC_OR_DEPARTMENT_OTHER): Payer: MEDICARE | Admitting: Physical Therapy

## 2020-12-16 ENCOUNTER — Encounter (HOSPITAL_BASED_OUTPATIENT_CLINIC_OR_DEPARTMENT_OTHER): Payer: Self-pay | Admitting: Physical Therapy

## 2020-12-16 DIAGNOSIS — M25552 Pain in left hip: Secondary | ICD-10-CM

## 2020-12-16 DIAGNOSIS — R2689 Other abnormalities of gait and mobility: Secondary | ICD-10-CM

## 2020-12-16 NOTE — Therapy (Addendum)
[facility] [address] [place], [place], [address] Phone: [phone]   Fax:  [phone]  Physical Therapy Treatment  Patient Details  Name: Deanna [name] MRN: [id number] Date of Birth: Jun 15, 1945 Referring Provider (PT): Dr Paralee Cancel   Encounter Date: 12/16/2020   PT End of Session - 12/16/20 1051     Visit Number 6    Number of Visits 6    Date for PT Re-Evaluation 12/24/20    Authorization Type [employer] progress note at 10 visits; KX at 15 visits.    PT Start Time 1030    PT Stop Time 1100    PT Time Calculation (min) 30 min    Activity Tolerance Patient tolerated treatment well    Behavior During Therapy WFL for tasks assessed/performed             Past Medical History:  Diagnosis Date   Abnormal glucose 03/06/2020   Age-related osteoporosis without current pathological fracture 11/22/2015   Overview:  Femoral neck  Formatting of this note might be different from the original. 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 Added automatically from request for surgery [id number]   Chronic pain syndrome 04/01/2015   Colon polyps    Essential hypertension 07/20/2000   Overview:  HBP   Female cystocele 02/27/2009   GAD (generalized anxiety disorder) 08/19/2015   GERD (gastroesophageal reflux disease)    History of Bell's palsy 03/21/2015   Hx of adenomatous colonic polyps 12/20/2008   Overview:  Colonoscopy 07/2003 - LMD - polyps removed - recommended repeat in 3 years Colonoscopy 09/2011 - Dr. Marian Sorrow - hyperplastic polyp(s) were removed - repeat in 5 years   Hypertension    Incomplete uterovaginal prolapse 03/04/2004   Insomnia    Left  bundle-branch block 05/20/2006   Lumbar radiculopathy 01/09/2016   Formatting of this note might be different from the original. Added automatically from request for surgery [id number]   LVH (left ventricular 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 05/23/2015   Non-rheumatic mitral regurgitation 03/21/2015   Osteopenia determined by x-ray 11/22/2015   Formatting of this note might be different from the original. Femoral neck   Other intervertebral disc degeneration, lumbar region 01/29/2015   Primary osteoarthritis, right hand 02/28/2002   Overview:  OA HANDS, ESP THUMBS   Radial tunnel syndrome, right 11/04/2016   Radiculopathy, lumbar region 11/22/2014   Overview:  Added automatically from request for surgery [id number]   Rhinitis, allergic 02/24/2002   Overview:  ALLERGY SYMPTOMS - states was seen by allergist in past and informed allergic to dust   Sacroiliitis ([place]) 08/02/2019   Screening for colon cancer 04/17/2005   Formatting of this note might be different from the original. 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 TRIGGER THUMBS - started spring 2003. Injected 02/28/02    Past Surgical  History:  Procedure Laterality Date   ABDOMINAL HYSTERECTOMY  2010   [place]   SPINAL FUSION  01/2016   TONSILLECTOMY AND ADENOIDECTOMY  1956   TOTAL HIP ARTHROPLASTY Left 09/17/2020   Procedure: TOTAL HIP ARTHROPLASTY ANTERIOR APPROACH;  Surgeon: Paralee Cancel, MD;  Location: [facility];  Service: Orthopedics;  Laterality: Left;  70 mins    There were no vitals filed for this visit.   Subjective Assessment - 12/16/20 1033      Subjective Pt reports feeling stiff in lower back and hip after returning to work. She states she was very busy and could not take a break.    Pertinent History HIP replacement    Limitations Walking;Standing    How long can you stand comfortably? 30-40    How long can you walk comfortably? 45 mins    Currently in Pain? No/denies                               [facility] Adult PT Treatment/Exercise - 12/16/20 0001       Knee/Hip Exercises: Stretches   Active Hamstring Stretch Left;3 reps;20 seconds    Active Hamstring Stretch Limitations reviewed on step    Hip Flexor Stretch Left;3 reps;20 seconds    Other Knee/Hip Stretches anterior hip stretch reviewed in standing for home 3x20 sec hold.      Knee/Hip Exercises: Aerobic   Recumbent Bike 5 min L2      Knee/Hip Exercises: Standing   Heel Raises 2 sets;10 reps;Both    Forward Step Up Both;2 sets;10 reps    Forward Step Up Limitations 4 inch step    Functional Squat 2 sets;10 reps;Other (comment)    Functional Squat Limitations elevated seat      Knee/Hip Exercises: Supine   Quad Sets Strengthening;Both;2 sets;10 reps    Bridges with Clamshell Strengthening;Both;2 sets;10 reps    Bridges with Clamshell --   with greenband   Other Supine Knee/Hip Exercises clamshells 2x10, with green band                    PT Education - 12/16/20 1206     Education Details reviewed standing and supine exercise stehcnique    [place](s) Educated Patient    Methods Explanation;Demonstration;Verbal cues;Tactile cues    Comprehension Verbalized understanding;Verbal cues required;Returned demonstration;Tactile cues required              PT Short Term Goals - 11/20/20 0801       PT SHORT TERM GOAL #1   Title Patient will increase left single leg stance to 5/5    Time 3    Period Weeks    Status New    Target Date 12/03/20      PT SHORT TERM GOAL #2   Title Patient will increase left gross LE  strength to 5/5     Time 4    Period Weeks    Status New    Target Date 12/10/20      PT SHORT TERM GOAL #3   Title Patient will be indepdnent with basic HEP    Time 4    Period Weeks    Status Achieved    Target Date 12/10/20               PT Long Term Goals - 11/12/20 0956       PT LONG TERM GOAL #1  Title Pateitn will return to walking for exercises without any increase in pain    Time 6    Period Weeks    Status New    Target Date 12/24/20      PT LONG TERM GOAL #2   Title Patient will self report standing for an hour without pain in order to return to work.    Time 6    Period Weeks    Status New    Target Date 12/24/20                   Plan - 12/16/20 1206     Clinical Impression Statement patient tolerated treatment well today . She has been a little more fatigued since she has gone back to working. She was standing a lot over the weekend. Her treatment was limited todayby time. She was 15 min late. She was atill able to complete standing exercises without much difficulty. The patient feels like she is doing well and weill be ready for D?C next visit.    Personal Factors and Comorbidities Comorbidity 1    Comorbidities lumbar fusion 2017    Examination-Activity Limitations Locomotion Level;Squat;Stairs;Stand    Examination-Participation Restrictions Cleaning;Community Activity;Shop;School;Occupation    Stability/Clinical Decision Making Stable/Uncomplicated    Clinical Decision Making Low    Rehab Potential Good    PT Frequency 1x / week    PT Duration 6 weeks    PT Treatment/Interventions ADLs/Self Care Home Management;Moist Heat;Cryotherapy;Electrical Stimulation;Traction;Ultrasound;Stair training;Functional mobility training;Therapeutic activities;Gait training;Therapeutic exercise;Neuromuscular re-education;Patient/family education;Manual techniques;Passive range of motion;Taping;Aquatic Therapy    PT Next Visit Plan continue to work on gait training; progress  moderate to higher level exercises if tolerated. Stair training and light leg press and mini squats; update HEP; Patient had anterior approach; Reassess patient goals, strength and ROM.    PT Home Exercise Plan Access Code: [email or web address],  quad sets 2x10, supine clam shells 3x10, banded bridge 3x10 with red band, lateral and forward hurdle walks 2x10, step ups 2x10, weighted shift 2x10, standing hip flexor stretch and hamstring stretch., 4 inch step up 2x10 for each leg, modified sit to stand 2x10.    Consulted and Agree with Plan of Care Patient             Patient will benefit from skilled therapeutic intervention in order to improve the following deficits and impairments:  Abnormal gait, Decreased endurance, Increased muscle spasms, Pain, Decreased activity tolerance, Difficulty walking  Visit Diagnosis: Pain in left hip  Other abnormalities of gait and mobility     Problem List Patient Active Problem List   Diagnosis Date Noted   S/P left total hip arthroplasty 09/17/2020   Pre-operative cardiovascular examination 09/03/2020   Arthritis    Colon polyps    GERD (gastroesophageal reflux disease)    Hypertension    Spinal stenosis    Short sleeper 04/29/2020   Snoring 04/29/2020   Insomnia 03/06/2020   Abnormal glucose 03/06/2020   Sacroiliitis ([place]) 08/02/2019   Sensorineural hearing loss (SNHL) of both ears 07/05/2019   Tinnitus aurium, bilateral 07/05/2019   Radial tunnel syndrome, right 11/04/2016   Lumbar radiculopathy 01/09/2016   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 11/26/2015   Age-related osteoporosis without current pathological fracture 11/22/2015   Osteopenia determined by x-ray 11/22/2015   Allergic rhinitis due to pollen 08/19/2015   GAD (generalized anxiety disorder) 08/19/2015   LVH (left ventricular 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 05/23/2015   Chronic pain syndrome 04/01/2015  Non-rheumatic mitral regurgitation 03/21/2015    History of Bell's palsy 03/21/2015   Other intervertebral disc degeneration, lumbar region 01/29/2015   Spinal stenosis, lumbar region without neurogenic claudication 11/22/2014   Radiculopathy, lumbar region 11/22/2014   Cervical disc disorder 05/10/2009   Female cystocele 02/27/2009   Hx of adenomatous colonic polyps 12/20/2008   Celiac disease 07/12/2006   Left bundle-branch block 05/20/2006   Chronic cough 02/15/2006   Screening for colon cancer 04/17/2005   Unspecified asthma, uncomplicated 62/86/3817   Incomplete uterovaginal prolapse 03/04/2004   Primary osteoarthritis, right hand 02/28/2002   Carpal tunnel syndrome, bilateral 02/28/2002   Trigger thumb, unspecified thumb 02/28/2002   Rhinitis, allergic 02/24/2002   Essential hypertension 07/20/2000    Carney Living PT DPT  12/16/2020, 1:56 PM  Davis Gourd SPT  12/16/2020   During this treatment session, the therapist was present, participating in and directing the treatment.   [facility] [address] [place], [place], [address] Phone: [phone]   Fax:  [phone]  Name: Deanna [name] MRN: [id number] Date of Birth: 06/28/1945

## 2020-12-23 ENCOUNTER — Ambulatory Visit (HOSPITAL_BASED_OUTPATIENT_CLINIC_OR_DEPARTMENT_OTHER): Payer: MEDICARE | Admitting: Physical Therapy

## 2020-12-23 ENCOUNTER — Encounter (HOSPITAL_BASED_OUTPATIENT_CLINIC_OR_DEPARTMENT_OTHER): Payer: Self-pay | Admitting: Physical Therapy

## 2020-12-23 ENCOUNTER — Other Ambulatory Visit: Payer: Self-pay

## 2020-12-23 DIAGNOSIS — R2689 Other abnormalities of gait and mobility: Secondary | ICD-10-CM

## 2020-12-23 DIAGNOSIS — M25552 Pain in left hip: Secondary | ICD-10-CM | POA: Diagnosis not present

## 2020-12-23 NOTE — Therapy (Addendum)
[place], [place], [address] Phone: [phone]   Fax:  [phone]  Physical Therapy Treatment/ Progress Note/Discharge   Patient Details  Name: Deanna [name] MRN: [id number] Date of Birth: 1945/10/27 Referring Provider (PT): Dr Paralee Cancel  Progress Note Reporting Period  11/11/2020 to 12/23/2020  See note below for Objective Data and Assessment of Progress/Goals.      Encounter Date: 12/23/2020   PT End of Session - 12/23/20 1036     Visit Number 7    Number of Visits 11    Date for PT Re-Evaluation 01/20/21    Authorization Type [employer] progress note at 10 visits; KX at 15 visits.    PT Start Time 0211    PT Stop Time 1058    PT Time Calculation (min) 43 min    Activity Tolerance Patient tolerated treatment well    Behavior During Therapy WFL for tasks assessed/performed             Past Medical History:  Diagnosis Date   Abnormal glucose 03/06/2020   Age-related osteoporosis without current pathological fracture 11/22/2015   Overview:  Femoral neck  Formatting of this note might be different from the original. 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 Added automatically from request for surgery [id number]   Chronic pain syndrome 04/01/2015   Colon polyps    Essential hypertension 07/20/2000   Overview:  HBP   Female cystocele 02/27/2009   GAD (generalized anxiety disorder) 08/19/2015   GERD (gastroesophageal reflux disease)    History of Bell's palsy 03/21/2015   Hx of adenomatous colonic polyps 12/20/2008   Overview:  Colonoscopy 07/2003 - [facility] - polyps removed - recommended repeat in 3 years Colonoscopy  09/2011 - Dr. Marian Sorrow - hyperplastic polyp(s) were removed - repeat in 5 years   Hypertension    Incomplete uterovaginal prolapse 03/04/2004   Insomnia    Left bundle-branch block 05/20/2006   Lumbar radiculopathy 01/09/2016   Formatting of this note might be different from the original. Added automatically from request for surgery [id number]   LVH (left ventricular 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 05/23/2015   Non-rheumatic mitral regurgitation 03/21/2015   Osteopenia determined by x-ray 11/22/2015   Formatting of this note might be different from the original. Femoral neck   Other intervertebral disc degeneration, lumbar region 01/29/2015   Primary osteoarthritis, right hand 02/28/2002   Overview:  OA HANDS, ESP THUMBS   Radial tunnel syndrome, right 11/04/2016   Radiculopathy, lumbar region 11/22/2014   Overview:  Added automatically from request for surgery [id number]   Rhinitis, allergic 02/24/2002   Overview:  ALLERGY SYMPTOMS - states was seen by allergist in past and informed allergic to dust   Sacroiliitis ([place]) 08/02/2019   Screening for colon cancer 04/17/2005   Formatting of this note might be different from the original. Colonoscopy 2005   Sensorineural hearing loss (SNHL) of both ears 07/05/2019   pt can hear normally   Short sleeper 04/29/2020   Snoring 04/29/2020   Spinal stenosis    Spinal stenosis, lumbar region without neurogenic claudication 11/22/2014   Tinnitus aurium, bilateral 07/05/2019   Trigger thumb, unspecified thumb  02/28/2002   Formatting of this note might be different from the original. TRIGGER THUMBS - started spring 2003. Injected 02/28/02    Past Surgical History:  Procedure Laterality Date   ABDOMINAL HYSTERECTOMY  2010   [place]   SPINAL FUSION  01/2016   TONSILLECTOMY AND ADENOIDECTOMY  1956   TOTAL HIP ARTHROPLASTY Left 09/17/2020   Procedure: TOTAL HIP ARTHROPLASTY ANTERIOR APPROACH;  Surgeon: Paralee Cancel, MD;   Location: [facility];  Service: Orthopedics;  Laterality: Left;  70 mins    There were no vitals filed for this visit.   Subjective Assessment - 12/23/20 1030     Subjective Patient reports she was up a lot over the weekend. She had some lower back pain. She did her exercsies and it is better.    Pertinent History HIP replacement    Limitations Walking;Standing    How long can you stand comfortably? 30-40    How long can you walk comfortably? 45 mins    Currently in Pain? No/denies   has weorked the lower back pain out               [facility] PT Assessment - 12/23/20 0001       Single Leg Stance   Comments improved but still limited      Strength   Left Hip Flexion 4+/5    Left Hip ABduction 5/5    Left Hip ADduction 5/5      Ambulation/Gait   Gait Comments improved single leg stance on the left but continues to have a mild antalfgic gait.                           [facility] PT Treatment/Exercise - 12/23/20 0001       Knee/Hip Exercises: Stretches   Other Knee/Hip Stretches lower trunk rotations rythmic x10      Knee/Hip Exercises: Aerobic   Recumbent Bike 5 min L2      Knee/Hip Exercises: Standing   Heel Raises 2 sets;10 reps;Both    Heel Raises Limitations lef tleg forrwad emphasis on weiht shift.    Hip Flexion PROM    Hip Flexion Limitations slow march with minimlal UE support    Lateral Step Up 2 sets;10 reps;Step Height: 6"    Forward Step Up Both;2 sets;10 reps    Forward Step Up Limitations 6 inch step    Functional Squat 2 sets;10 reps;Other (comment)      Knee/Hip Exercises: Supine   Quad Sets Strengthening;Both;2 sets;10 reps    Bridges Limitations 2x10    Other Supine Knee/Hip Exercises clamshells 2x10, with green band                    PT Education - 12/23/20 1036     Education Details HEP and symptom mangement    Wille(s) Educated Patient    Methods Explanation;Demonstration;Verbal cues;Tactile cues     Comprehension Verbalized understanding;Returned demonstration;Verbal cues required;Tactile cues required              PT Short Term Goals - 11/20/20 0801       PT SHORT TERM GOAL #1   Title Patient will increase left single leg stance to 5/5    Time 3    Period Weeks    Status New    Target Date 12/03/20      PT SHORT TERM GOAL #2   Title Patient will  increase left gross LE  strength to 5/5    Time 4    Period Weeks    Status New    Target Date 12/10/20      PT SHORT TERM GOAL #3   Title Patient will be indepdnent with basic HEP    Time 4    Period Weeks    Status Achieved    Target Date 12/10/20               PT Long Term Goals - 11/12/20 0956       PT LONG TERM GOAL #1   Title Pateitn will return to walking for exercises without any increase in pain    Time 6    Period Weeks    Status New    Target Date 12/24/20      PT LONG TERM GOAL #2   Title Patient will self report standing for an hour without pain in order to return to work.    Time 6    Period Weeks    Status New    Target Date 12/24/20                   Plan - 12/23/20 1040     Clinical Impression Statement Patient is making good progress. Since her return to work she has had a mild increase in lower back pain and decreased endurance. Her strength has improved as well as her single leg stability on the left. She would benefit from further skilled therapy to continue to work on functional strength such as steps and squating, as well as endurance training . We will see her 1W4. She tolerated treatment well today. Therapy reviewed stretching and standing functional exercises with her.    Comorbidities lumbar fusion 2017    Examination-Activity Limitations Locomotion Level;Squat;Stairs;Stand    Examination-Participation Restrictions Cleaning;Community Activity;Shop;School;Occupation    Stability/Clinical Decision Making Stable/Uncomplicated    Clinical Decision Making Low    Rehab  Potential Good    PT Frequency 1x / week    PT Duration 4 weeks    PT Treatment/Interventions ADLs/Self Care Home Management;Moist Heat;Cryotherapy;Electrical Stimulation;Traction;Ultrasound;Stair training;Functional mobility training;Therapeutic activities;Gait training;Therapeutic exercise;Neuromuscular re-education;Patient/family education;Manual techniques;Passive range of motion;Taping;Aquatic Therapy    PT Home Exercise Plan Access Code: [email or web address],  quad sets 2x10, supine clam shells 3x10, banded bridge 3x10 with red band, lateral and forward hurdle walks 2x10, step ups 2x10, weighted shift 2x10, standing hip flexor stretch and hamstring stretch., 4 inch step up 2x10 for each leg, modified sit to stand 2x10.    Consulted and Agree with Plan of Care Patient             Patient will benefit from skilled therapeutic intervention in order to improve the following deficits and impairments:  Abnormal gait, Decreased endurance, Increased muscle spasms, Pain, Decreased activity tolerance, Difficulty walking  Visit Diagnosis: Pain in left hip  Other abnormalities of gait and mobility   PHYSICAL THERAPY DISCHARGE SUMMARY  Visits from Start of Care: 7  Current functional level related to goals / functional outcomes:improved pain and ability to ambualte    Remaining deficits: None   Education / Equipment: HEP    Patient agrees to discharge. Patient goals were met. Patient is being discharged due to not returning since the last visit.   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 11/26/2015   Age-related osteoporosis without current pathological fracture 11/22/2015   Osteopenia determined by x-ray 11/22/2015   Allergic rhinitis due to pollen 08/19/2015   GAD (generalized anxiety disorder) 08/19/2015   LVH (left ventricular 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 05/23/2015   Chronic pain syndrome 04/01/2015   Non-rheumatic mitral regurgitation 03/21/2015   History of Bell's palsy 03/21/2015   Other intervertebral disc degeneration, lumbar region 01/29/2015   Spinal stenosis, lumbar region without neurogenic claudication 11/22/2014   Radiculopathy, lumbar region 11/22/2014   Cervical disc disorder 05/10/2009   Female cystocele 02/27/2009   Hx of adenomatous colonic polyps 12/20/2008   Celiac disease 07/12/2006   Left bundle-branch block 05/20/2006   Chronic cough 02/15/2006   Screening for colon cancer 04/17/2005   Unspecified asthma, uncomplicated 53/64/6803   Incomplete uterovaginal prolapse 03/04/2004   Primary osteoarthritis, right hand 02/28/2002   Carpal tunnel syndrome, bilateral 02/28/2002   Trigger thumb, unspecified thumb 02/28/2002   Rhinitis, allergic 02/24/2002   Essential hypertension 07/20/2000    [facility] Living 12/23/2020, 11:13 AM  [place], [place], [address] Phone: [phone]   Fax:  [phone]  Name: Deanna [name] MRN: [id number] Date of Birth: June 06, 1945

## 2020-12-26 ENCOUNTER — Encounter: Payer: Self-pay | Admitting: Nurse Practitioner

## 2020-12-26 ENCOUNTER — Ambulatory Visit (INDEPENDENT_AMBULATORY_CARE_PROVIDER_SITE_OTHER): Payer: MEDICARE

## 2020-12-26 ENCOUNTER — Ambulatory Visit (INDEPENDENT_AMBULATORY_CARE_PROVIDER_SITE_OTHER): Payer: MEDICARE | Admitting: Nurse Practitioner

## 2020-12-26 ENCOUNTER — Other Ambulatory Visit: Payer: Self-pay

## 2020-12-26 VITALS — BP 110/60 | HR 80 | Temp 98.2°F | Ht 61.8 in | Wt 151.8 lb

## 2020-12-26 VITALS — BP 110/60 | HR 80 | Temp 98.2°F | Ht 61.0 in | Wt 151.0 lb

## 2020-12-26 DIAGNOSIS — Z0000 Encounter for general adult medical examination without abnormal findings: Secondary | ICD-10-CM

## 2020-12-26 DIAGNOSIS — M85831 Other specified disorders of bone density and structure, right forearm: Secondary | ICD-10-CM

## 2020-12-26 DIAGNOSIS — M79604 Pain in right leg: Secondary | ICD-10-CM

## 2020-12-26 MED ORDER — KETOROLAC TROMETHAMINE 30 MG/ML IJ SOLN
30.0000 mg | Freq: Once | INTRAMUSCULAR | Status: AC
  Administered 2020-12-26: 30 mg via INTRAMUSCULAR

## 2020-12-26 NOTE — Patient Instructions (Signed)
Deanna [name] , Thank you for taking time to come for your Medicare Wellness Visit. I appreciate your ongoing commitment to your health goals. Please review the following plan we discussed and let me know if I can assist you in the future.   Screening recommendations/referrals: Colonoscopy: states had within ten years Mammogram: completed 05/31/2019 Bone Density: completed 11/29/2020 Recommended yearly ophthalmology/optometry visit for glaucoma screening and checkup Recommended yearly dental visit for hygiene and checkup  Vaccinations: Influenza vaccine: decline Pneumococcal vaccine: completed 05/08/2016 Tdap vaccine: completed 04/13/2014, due 04/13/2024 Shingles vaccine: discussed   Covid-19: 07/20/2020, 09/05/2019, 08/15/2019  Advanced directives: Please bring a copy of your POA (Power of Attorney) and/or Living Will to your next appointment.   Conditions/risks identified: none  Next appointment: Follow up in one year for your annual wellness visit    Preventive Care 65 Years and Older, Female Preventive care refers to lifestyle choices and visits with your health care provider that can promote health and wellness. What does preventive care include? A yearly physical exam. This is also called an annual well check. Dental exams once or twice a year. Routine eye exams. Ask your health care provider how often you should have your eyes checked. Personal lifestyle choices, including: Daily care of your teeth and gums. Regular physical activity. Eating a healthy diet. Avoiding tobacco and drug use. Limiting alcohol use. Practicing safe sex. Taking low-dose aspirin every day. Taking vitamin and mineral supplements as recommended by your health care provider. What happens during an annual well check? The services and screenings done by your health care provider during your annual well check will depend on your age, overall health, lifestyle risk factors, and family history of  disease. Counseling  Your health care provider may ask you questions about your: Alcohol use. Tobacco use. Drug use. Emotional well-being. Home and relationship well-being. Sexual activity. Eating habits. History of falls. Memory and ability to understand (cognition). Work and work Statistician. Reproductive health. Screening  You may have the following tests or measurements: Height, weight, and BMI. Blood pressure. Lipid and cholesterol levels. These may be checked every 5 years, or more frequently if you are over 7 years old. Skin check. Lung cancer screening. You may have this screening every year starting at age 58 if you have a 30-pack-year history of smoking and currently smoke or have quit within the past 15 years. Fecal occult blood test (FOBT) of the stool. You may have this test every year starting at age 43. Flexible sigmoidoscopy or colonoscopy. You may have a sigmoidoscopy every 5 years or a colonoscopy every 10 years starting at age 57. Hepatitis C blood test. Hepatitis B blood test. Sexually transmitted disease (STD) testing. Diabetes screening. This is done by checking your blood sugar (glucose) after you have not eaten for a while (fasting). You may have this done every 1-3 years. Bone density scan. This is done to screen for osteoporosis. You may have this done starting at age 49. Mammogram. This may be done every 1-2 years. Talk to your health care provider about how often you should have regular mammograms. Talk with your health care provider about your test results, treatment options, and if necessary, the need for more tests. Vaccines  Your health care provider may recommend certain vaccines, such as: Influenza vaccine. This is recommended every year. Tetanus, diphtheria, and acellular pertussis (Tdap, Td) vaccine. You may need a Td booster every 10 years. Zoster vaccine. You may need this after age 2. Pneumococcal 13-valent conjugate (PCV13) vaccine.  One  dose is recommended after age 35. Pneumococcal polysaccharide (PPSV23) vaccine. One dose is recommended after age 42. Talk to your health care provider about which screenings and vaccines you need and how often you need them. This information is not intended to replace advice given to you by your health care provider. Make sure you discuss any questions you have with your health care provider. Document Released: 06/14/2015 Document Revised: 02/05/2016 Document Reviewed: 03/19/2015 Elsevier Interactive Patient Education  2017 [employer] Prevention in the Home Falls can cause injuries. They can happen to people of all ages. There are many things you can do to make your home safe and to help prevent falls. What can I do on the outside of my home? Regularly fix the edges of walkways and driveways and fix any cracks. Remove anything that might make you trip as you walk through a door, such as a raised step or threshold. Trim any bushes or trees on the path to your home. Use bright outdoor lighting. Clear any walking paths of anything that might make someone trip, such as rocks or tools. Regularly check to see if handrails are loose or broken. Make sure that both sides of any steps have handrails. Any raised decks and porches should have guardrails on the edges. Have any leaves, snow, or ice cleared regularly. Use sand or salt on walking paths during winter. Clean up any spills in your garage right away. This includes oil or grease spills. What can I do in the bathroom? Use night lights. Install grab bars by the toilet and in the tub and shower. Do not use towel bars as grab bars. Use non-skid mats or decals in the tub or shower. If you need to sit down in the shower, use a plastic, non-slip stool. Keep the floor dry. Clean up any water that spills on the floor as soon as it happens. Remove soap buildup in the tub or shower regularly. Attach bath mats securely with double-sided  non-slip rug tape. Do not have throw rugs and other things on the floor that can make you trip. What can I do in the bedroom? Use night lights. Make sure that you have a light by your bed that is easy to reach. Do not use any sheets or blankets that are too big for your bed. They should not hang down onto the floor. Have a firm chair that has side arms. You can use this for support while you get dressed. Do not have throw rugs and other things on the floor that can make you trip. What can I do in the kitchen? Clean up any spills right away. Avoid walking on wet floors. Keep items that you use a lot in easy-to-reach places. If you need to reach something above you, use a strong step stool that has a grab bar. Keep electrical cords out of the way. Do not use floor polish or wax that makes floors slippery. If you must use wax, use non-skid floor wax. Do not have throw rugs and other things on the floor that can make you trip. What can I do with my stairs? Do not leave any items on the stairs. Make sure that there are handrails on both sides of the stairs and use them. Fix handrails that are broken or loose. Make sure that handrails are as long as the stairways. Check any carpeting to make sure that it is firmly attached to the stairs. Fix any carpet that is loose or  worn. Avoid having throw rugs at the top or bottom of the stairs. If you do have throw rugs, attach them to the floor with carpet tape. Make sure that you have a light switch at the top of the stairs and the bottom of the stairs. If you do not have them, ask someone to add them for you. What else can I do to help prevent falls? Wear shoes that: Do not have high heels. Have rubber bottoms. Are comfortable and fit you well. Are closed at the toe. Do not wear sandals. If you use a stepladder: Make sure that it is fully opened. Do not climb a closed stepladder. Make sure that both sides of the stepladder are locked into place. Ask  someone to hold it for you, if possible. Clearly mark and make sure that you can see: Any grab bars or handrails. First and last steps. Where the edge of each step is. Use tools that help you move around (mobility aids) if they are needed. These include: Canes. Walkers. Scooters. Crutches. Turn on the lights when you go into a dark area. Replace any light bulbs as soon as they burn out. Set up your furniture so you have a clear path. Avoid moving your furniture around. If any of your floors are uneven, fix them. If there are any pets around you, be aware of where they are. Review your medicines with your doctor. Some medicines can make you feel dizzy. This can increase your chance of falling. Ask your doctor what other things that you can do to help prevent falls. This information is not intended to replace advice given to you by your health care provider. Make sure you discuss any questions you have with your health care provider. Document Released: 03/14/2009 Document Revised: 10/24/2015 Document Reviewed: 06/22/2014 Elsevier Interactive Patient Education  2017 [employer].

## 2020-12-26 NOTE — Progress Notes (Signed)
This visit occurred during the SARS-CoV-2 public health emergency.  Safety protocols were in place, including screening questions prior to the visit, additional usage of staff PPE, and extensive cleaning of exam room while observing appropriate contact time as indicated for disinfecting solutions.  Subjective:     Patient ID: Deanna [name] , female    DOB: [birth year] , 75 y.o.   MRN: [id number]   Chief Complaint  Patient presents with   Leg Pain     HPI  Deanna [name] was here for her AWV and let the nurse know her right leg is painful since Tuesday. Deanna [name] has taken 3 ibuprofen today. Deanna [name] has been having difficulty with walking due to the pain. Deanna [name] has eaten fish prior to the pain but not shellfish.  Denies drinking beer. Denies any long trips. Deanna [name] felt like the pain was bad enough to go to the ER.     Past Medical History:  Diagnosis Date   Abnormal glucose 03/06/2020   Age-related osteoporosis without current pathological fracture 11/22/2015   Overview:  Femoral neck  Formatting of this note might be different from the original. 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 Added automatically from request for surgery [id number]   Chronic pain syndrome 04/01/2015   Colon polyps    Essential hypertension 07/20/2000   Overview:  HBP   Female cystocele 02/27/2009   GAD (generalized anxiety disorder) 08/19/2015   GERD (gastroesophageal reflux disease)    History of Bell's palsy 03/21/2015   Hx of adenomatous colonic polyps 12/20/2008   Overview:  Colonoscopy 07/2003 - [facility] - polyps removed - recommended repeat in 3 years Colonoscopy 09/2011 - Dr. Marian Sorrow - hyperplastic polyp(s) were removed - repeat in 5 years   Hypertension     Incomplete uterovaginal prolapse 03/04/2004   Insomnia    Left bundle-branch block 05/20/2006   Lumbar radiculopathy 01/09/2016   Formatting of this note might be different from the original. Added automatically from request for surgery [id number]   LVH (left ventricular 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 05/23/2015   Non-rheumatic mitral regurgitation 03/21/2015   Osteopenia determined by x-ray 11/22/2015   Formatting of this note might be different from the original. Femoral neck   Other intervertebral disc degeneration, lumbar region 01/29/2015   Primary osteoarthritis, right hand 02/28/2002   Overview:  OA HANDS, ESP THUMBS   Radial tunnel syndrome, right 11/04/2016   Radiculopathy, lumbar region 11/22/2014   Overview:  Added automatically from request for surgery [id number]   Rhinitis, allergic 02/24/2002   Overview:  ALLERGY SYMPTOMS - states was seen by allergist in past and informed allergic to dust   Sacroiliitis ([place]) 08/02/2019   Screening for colon cancer 04/17/2005   Formatting of this note might be different from the original. 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 TRIGGER THUMBS - started spring  2003. Injected 02/28/02     Family History  Problem Relation Age of Onset   Arthritis Mother    Diabetes Mother    Pancreatic cancer Mother    Early death Father    Leukemia Father    Early death Sister    Diabetes Sister      Current Outpatient Medications:    amLODipine (NORVASC) 10 MG tablet, Take 1 tablet (10 mg total) by mouth daily., Disp: 90 tablet, Rfl: 1   Ascorbic Acid (VITAMIN C) 1000 MG tablet, Take 2,000 mg by mouth daily.,  Disp: , Rfl:    Calcium-Magnesium (CAL-MAG PO), Take 2,000 mg by mouth at bedtime. 1000 mg, Disp: , Rfl:    Carboxymethylcellul-Glycerin (CLEAR EYES FOR DRY EYES OP), Place 1 drop into both eyes daily., Disp: , Rfl:    Cholecalciferol (VITAMIN D PO), Take 2,000 Units by mouth daily., Disp: , Rfl:    dexlansoprazole (DEXILANT) 60 MG capsule, Take 1 capsule (60 mg total) by mouth daily., Disp: 30 capsule, Rfl: 5   docusate sodium (COLACE) 100 MG capsule, TAKE 1 CAPSULE BY MOUTH TWICE A DAY (Patient not taking: No sig reported), Disp: 10 capsule, Rfl: 0   fexofenadine (ALLEGRA ALLERGY) 180 MG tablet, Take 1 tablet (180 mg total) by mouth daily., Disp: 30 tablet, Rfl: 2   hydrochlorothiazide (HYDRODIURIL) 25 MG tablet, Take 1 tablet (25 mg total) by mouth 2 (two) times daily., Disp: 180 tablet, Rfl: 3   HYDROcodone-acetaminophen (NORCO/VICODIN) 5-325 MG tablet, Take 1 tablet by mouth every 6 (six) hours as needed. (Patient not taking: Reported on 11/11/2020), Disp: , Rfl:    hydrocortisone (ANUSOL-HC) 25 MG suppository, Place 1 suppository (25 mg total) rectally 2 (two) times daily as needed for hemorrhoids or anal itching., Disp: 12 suppository, Rfl: 3   losartan (COZAAR) 50 MG tablet, Take 1 tablet (50 mg total) by mouth daily., Disp: 90 tablet, Rfl: 1   Magnesium Salicylate 119 MG TABS, Take 325 mg by mouth daily., Disp: , Rfl:    methocarbamol (ROBAXIN) 500 MG tablet, Take 1 tablet (500 mg total) by mouth every 6 (six) hours as needed for muscle spasms. (Patient not taking: Reported on 12/26/2020), Disp: 40 tablet, Rfl: 0   mometasone (NASONEX) 50 MCG/ACT nasal spray, Place 2 sprays into the nose daily., Disp: 1 each, Rfl: 2   montelukast (SINGULAIR) 10 MG tablet, Take 1 tablet (10 mg total) by mouth at bedtime. (Patient not taking: Reported on 12/26/2020), Disp: 90 tablet, Rfl: 1   OVER THE COUNTER MEDICATION, Take 2,000 mg by mouth daily. Pau-D'arco 1000 mg each, Disp: , Rfl:    polyethylene glycol  (MIRALAX / GLYCOLAX) 17 g packet, Take 17 g by mouth daily as needed for mild constipation., Disp: 14 each, Rfl: 0   Probiotic Product (PROBIOTIC DAILY PO), Take 1 tablet by mouth daily., Disp: , Rfl:    Vitamin A 2400 MCG (8000 UT) CAPS, Take 16,000 Units by mouth daily. 8000 units, Disp: , Rfl:    Allergies  Allergen Reactions   Gluten Meal Diarrhea and Other (See Comments)    Severe diarrhea per member    Codeine     Chest pains, acid reflux    Dust Mite Extract     Upper Respiratory issues    Gabapentin     Eyes went numb and blurry   Terbinafine Hcl Rash     Review of Systems  Constitutional: Negative.   Respiratory: Negative.    Cardiovascular: Negative.  Negative  for chest pain, palpitations and leg swelling.  Musculoskeletal:  Positive for myalgias.       Right leg pain since Tuesday  Neurological:  Negative for dizziness and headaches.  Psychiatric/Behavioral: Negative.      Today's Vitals   12/26/20 1301  BP: 110/60  Pulse: 80  Temp: 98.2 F (36.8 C)  Weight: 151 lb (68.5 kg)  Height: 5' 1"  (1.549 m)  PainSc: 0-No pain   Body mass index is 28.53 kg/m.   Objective:  Physical Exam Vitals reviewed.  Constitutional:      General: Deanna [name] is not in acute distress.    Appearance: Normal appearance.  Cardiovascular:     Pulses: Normal pulses.     Heart sounds: Normal heart sounds. No murmur heard. Pulmonary:     Effort: Pulmonary effort is normal. No respiratory distress.     Breath sounds: No wheezing.  Musculoskeletal:        General: Tenderness (right lateral lower extremity) present. Normal range of motion.     Right lower leg: No edema.     Left lower leg: No edema.  Skin:    General: Skin is warm.  Neurological:     General: No focal deficit present.     Mental Status: Deanna [name] is alert and oriented to [place], place, and time.     Cranial Nerves: No cranial nerve deficit.     Motor: No weakness.  Psychiatric:        Mood and Affect: Mood normal.         Behavior: Behavior normal.        Thought Content: Thought content normal.        Judgment: Judgment normal.        Assessment And Plan:     1. Right leg pain Will check for uric acid Will also check venous doppler which I feel is a least likely cause of DVT due to decrease risk.  Toradol 30 mg IM given in office Encouraged to rest leg for few days.  - Uric acid - VAS [place] LOWER EXTREMITY VENOUS (DVT); Future - ketorolac (TORADOL) 30 MG/ML injection 30 mg   2. Osteopenia of right forearm Discussed results of her her bone density of osteopenia forearm score is - 2.3 Encouraged to take vitamin d regularly   Patient was given opportunity to ask questions. Patient verbalized understanding of the plan and was able to repeat key elements of the plan. All questions were answered to their satisfaction.  [name], FNP   I, [name], FNP, have reviewed all documentation for this visit. The documentation on 12/26/20 for the exam, diagnosis, procedures, and orders are all accurate and complete.   IF YOU HAVE BEEN REFERRED TO A SPECIALIST, IT MAY TAKE 1-2 WEEKS TO SCHEDULE/PROCESS THE REFERRAL. IF YOU HAVE NOT HEARD FROM US/SPECIALIST IN TWO WEEKS, PLEASE GIVE [place] A CALL AT [phone].   THE PATIENT IS ENCOURAGED TO PRACTICE SOCIAL DISTANCING DUE TO THE COVID-19 PANDEMIC.

## 2020-12-26 NOTE — Progress Notes (Signed)
This visit occurred during the SARS-CoV-2 public health emergency.  Safety protocols were in place, including screening questions prior to the visit, additional usage of staff PPE, and extensive cleaning of exam room while observing appropriate contact time as indicated for disinfecting solutions.  Subjective:   Deanna [name] is a 75 y.o. female who presents for Medicare Annual (Subsequent) preventive examination.  Review of Systems     Cardiac Risk Factors include: advanced age (>27mn, >>74women);hypertension;sedentary lifestyle     Objective:    Today's Vitals   12/26/20 1213 12/26/20 1224  BP: 110/60   Pulse: 80   Temp: 98.2 F (36.8 C)   TempSrc: Oral   SpO2: 97%   Weight: 151 lb 12.8 oz (68.9 kg)   Height: 5' 1.8" (1.57 m)   PainSc:  8    Body mass index is 27.94 kg/m.  Advanced Directives 12/26/2020 09/17/2020 09/17/2020 09/11/2020 12/21/2019  Does Patient Have a Medical Advance Directive? Yes Yes Yes Yes No  Type of [place] Living will;Healthcare Power of Attorney Living will;Healthcare Power of Attorney Living will;Healthcare Power of Attorney -  Does patient want to make changes to medical advance directive? - No - Patient declined No - Patient declined - -  Copy of [place] Chart? No - copy requested No - copy requested No - copy requested - -    Current Medications (verified) Outpatient Encounter Medications as of 12/26/2020  Medication Sig   amLODipine (NORVASC) 10 MG tablet Take 1 tablet (10 mg total) by mouth daily.   Ascorbic Acid (VITAMIN C) 1000 MG tablet Take 2,000 mg by mouth daily.   Calcium-Magnesium (CAL-MAG PO) Take 2,000 mg by mouth at bedtime. 1000 mg   Carboxymethylcellul-Glycerin (CLEAR EYES FOR DRY EYES OP) Place 1 drop into both eyes daily.   Cholecalciferol (VITAMIN D PO) Take 2,000 Units by mouth daily.   dexlansoprazole (DEXILANT) 60 MG capsule Take 1 capsule (60 mg  total) by mouth daily.   fexofenadine (ALLEGRA ALLERGY) 180 MG tablet Take 1 tablet (180 mg total) by mouth daily.   hydrochlorothiazide (HYDRODIURIL) 25 MG tablet Take 1 tablet (25 mg total) by mouth 2 (two) times daily.   hydrocortisone (ANUSOL-HC) 25 MG suppository Place 1 suppository (25 mg total) rectally 2 (two) times daily as needed for hemorrhoids or anal itching.   losartan (COZAAR) 50 MG tablet Take 1 tablet (50 mg total) by mouth daily.   Magnesium Salicylate 3628MG TABS Take 325 mg by mouth daily.   mometasone (NASONEX) 50 MCG/ACT nasal spray Place 2 sprays into the nose daily.   OVER THE COUNTER MEDICATION Take 2,000 mg by mouth daily. Pau-D'arco 1000 mg each   polyethylene glycol (MIRALAX / GLYCOLAX) 17 g packet Take 17 g by mouth daily as needed for mild constipation.   Probiotic Product (PROBIOTIC DAILY PO) Take 1 tablet by mouth daily.   Vitamin A 2400 MCG (8000 UT) CAPS Take 16,000 Units by mouth daily. 8000 units   docusate sodium (COLACE) 100 MG capsule TAKE 1 CAPSULE BY MOUTH TWICE A DAY (Patient not taking: No sig reported)   HYDROcodone-acetaminophen (NORCO/VICODIN) 5-325 MG tablet Take 1 tablet by mouth every 6 (six) hours as needed. (Patient not taking: Reported on 11/11/2020)   methocarbamol (ROBAXIN) 500 MG tablet Take 1 tablet (500 mg total) by mouth every 6 (six) hours as needed for muscle spasms. (Patient not taking: Reported on 12/26/2020)   montelukast (SINGULAIR) 10 MG  tablet Take 1 tablet (10 mg total) by mouth at bedtime. (Patient not taking: Reported on 12/26/2020)   No facility-administered encounter medications on file as of 12/26/2020.    Allergies (verified) Gluten meal, Codeine, Dust mite extract, Gabapentin, and Terbinafine hcl   History: Past Medical History:  Diagnosis Date   Abnormal glucose 03/06/2020   Age-related osteoporosis without current pathological fracture 11/22/2015   Overview:  Femoral neck  Formatting of this note might be different from  the original. 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 Added automatically from request for surgery [id number]   Chronic pain syndrome 04/01/2015   Colon polyps    Essential hypertension 07/20/2000   Overview:  HBP   Female cystocele 02/27/2009   GAD (generalized anxiety disorder) 08/19/2015   GERD (gastroesophageal reflux disease)    History of Bell's palsy 03/21/2015   Hx of adenomatous colonic polyps 12/20/2008   Overview:  Colonoscopy 07/2003 - [facility] - polyps removed - recommended repeat in 3 years Colonoscopy 09/2011 - Dr. Marian Sorrow - hyperplastic polyp(s) were removed - repeat in 5 years   Hypertension    Incomplete uterovaginal prolapse 03/04/2004   Insomnia    Left bundle-branch block 05/20/2006   Lumbar radiculopathy 01/09/2016   Formatting of this note might be different from the original. Added automatically from request for surgery [id number]   LVH (left ventricular 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 05/23/2015   Non-rheumatic mitral regurgitation 03/21/2015   Osteopenia determined by x-ray 11/22/2015   Formatting of this note might be different from the original. Femoral neck   Other intervertebral disc degeneration, lumbar region 01/29/2015   Primary osteoarthritis, right hand 02/28/2002   Overview:  OA HANDS, ESP THUMBS   Radial tunnel syndrome, right 11/04/2016   Radiculopathy, lumbar region 11/22/2014   Overview:  Added automatically from request for surgery [id number]   Rhinitis, allergic 02/24/2002   Overview:  ALLERGY SYMPTOMS - states was seen by allergist in past and informed allergic to dust   Sacroiliitis ([place]) 08/02/2019   Screening for colon cancer 04/17/2005   Formatting of this note might be  different from the original. 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 TRIGGER THUMBS - started spring 2003. Injected 02/28/02   Past Surgical History:  Procedure Laterality Date   ABDOMINAL HYSTERECTOMY  2010   [place]   SPINAL FUSION  01/2016   TONSILLECTOMY AND ADENOIDECTOMY  1956   TOTAL HIP ARTHROPLASTY Left 09/17/2020   Procedure: TOTAL HIP ARTHROPLASTY ANTERIOR APPROACH;  Surgeon: Paralee Cancel, MD;  Location: [facility];  Service: Orthopedics;  Laterality: Left;  70 mins   Family History  Problem Relation Age of Onset   Arthritis Mother    Diabetes Mother    Pancreatic cancer Mother    Early death Father    Leukemia Father  Early death Sister    Diabetes Sister    Social History   Socioeconomic History   Marital status: Divorced    Spouse name: Not on file   Number of children: 2   Years of education: 2 years college   Highest education level: Not on file  Occupational History   Occupation: Retired  Tobacco Use   Smoking status: Former    Types: Cigarettes    Quit date: 12/31/1997    Years since quitting: 23.0   Smokeless tobacco: Never  Vaping Use   Vaping Use: Never used  Substance and Sexual Activity   Alcohol use: Yes   Drug use: No   Sexual activity: Not Currently  Other Topics Concern   Not on file  Social History Narrative   Lives alone.   Right-handed.   One cup caffeine per day.   Social Determinants of Health   Financial Resource Strain: Low Risk    Difficulty of Paying Living Expenses: Not hard at all  Food Insecurity: No Food Insecurity   Worried About Ship broker in the Last Year: Never true   [place] in the Last Year: Never true  Transportation Needs: No Transportation Needs   Lack of Transportation (Medical): No   Lack of Transportation (Non-Medical): No  Physical Activity: Inactive   Days of Exercise per Week: 0 days   Minutes of Exercise per Session: 0 min  Stress: Not on file  Social Connections: Not on file    Tobacco Counseling Counseling given: Not Answered   Clinical Intake:  Pre-visit preparation completed: Yes  Pain : 0-10 Pain Score: 8  Pain Type: Acute pain Pain Location: Leg Pain Orientation: Right Pain Descriptors / Indicators: Sore, Shooting Pain Onset: In the past 7 days Pain Frequency: Constant     Nutritional Status: BMI 25 -29 Overweight Nutritional Risks: None Diabetes: No  How often do you need to have someone help you when you read instructions, pamphlets, or other written materials from your doctor or pharmacy?: 1 - Never  Diabetic? no  Interpreter Needed?: No  Information entered by :: NAllen LPN   Activities of Daily Living In your present state of health, do you have any difficulty performing the following activities: 12/26/2020 09/17/2020  Hearing? N N  Vision? N N  Difficulty concentrating or making decisions? N Y  Walking or climbing stairs? Y Y  Comment - -  Dressing or bathing? Y N  Comment due to leg -  Doing errands, shopping? N N  Preparing Food and eating ? N -  Using the Toilet? N -  In the past six months, have you accidently leaked urine? N -  Do you have problems with loss of bowel control? N -  Managing your Medications? N -  Managing your Finances? N -  Housekeeping or managing your Housekeeping? N -  Some recent data might be hidden    Patient Care Team: [name], FNP as PCP - General (General Practice)  Indicate any recent Medical Services you may have received from other than [facility] providers in the past year (date may be approximate).      Assessment:   This is a routine wellness examination for Angeli.  Hearing/Vision screen No results found.  Dietary issues and exercise activities discussed: Current Exercise Habits: The patient does not participate in regular exercise at present   Goals Addressed             This Visit's Progress  Patient Stated       12/26/2020, find out what's wrong with leg       Depression Screen PHQ 2/9 Scores 12/26/2020 12/21/2019 08/14/2019 07/05/2019  PHQ - 2 Score 0 0 0 0    Fall Risk Fall Risk  12/26/2020 12/21/2019 08/14/2019 07/05/2019  Falls in the past year? 0 0 0 0  Number falls in past yr: - - - 0  Injury with Fall? - - - 0  Risk for fall due to : Medication side effect - - -  Follow up Falls evaluation completed;Education provided;Falls prevention discussed Falls evaluation completed;Education provided;Falls prevention discussed - Falls evaluation completed    FALL RISK PREVENTION PERTAINING TO THE HOME:  Any stairs in or around the home? No  If so, are there any without handrails?  N/a Home free of loose throw rugs in walkways, pet beds, electrical cords, etc? Yes  Adequate lighting in your home to reduce risk of falls? Yes   ASSISTIVE DEVICES UTILIZED TO PREVENT FALLS:  Life alert? No  Use of a cane, walker or w/c? Yes  Grab bars in the bathroom? No  Shower chair or bench in shower? Yes  Elevated toilet seat or a handicapped toilet? Yes   TIMED UP AND GO:  Was the test performed? No .    Gait slow and steady with assistive device  Cognitive Function:     6CIT Screen 12/21/2019  What Year? 0 points  What month? 0 points  What time? 0 points  Count back from 20 0 points  Months in reverse 0 points  Repeat phrase 0 points  Total Score 0    Immunizations Immunization History  Administered Date(s) Administered   Influenza Split 05/20/2006   Influenza, High Dose Seasonal PF 05/20/2006   PFIZER(Purple Top)SARS-COV-2 Vaccination 08/15/2019, 09/05/2019,  07/20/2020   Pneumococcal Conjugate-13 05/31/2013, 05/31/2013   Pneumococcal Polysaccharide-23 05/08/2016   Td 03/10/2004   Tdap 03/10/2004, 04/13/2014, 04/13/2014    TDAP status: Up to date  Flu Vaccine status: Declined, Education has been provided regarding the importance of this vaccine but patient still declined. Advised may receive this vaccine at local pharmacy or Health Dept. Aware to provide a copy of the vaccination record if obtained from local pharmacy or Health Dept. Verbalized acceptance and understanding.  Pneumococcal vaccine status: Up to date  Covid-19 vaccine status: Completed vaccines  Qualifies for Shingles Vaccine? Yes   Zostavax completed No   Shingrix Completed?: No.    Education has been provided regarding the importance of this vaccine. Patient has been advised to call insurance company to determine out of pocket expense if they have not yet received this vaccine. Advised may also receive vaccine at local pharmacy or Health Dept. Verbalized acceptance and understanding.  Screening Tests Health Maintenance  Topic Date Due   COLONOSCOPY (Pts 45-50yr Insurance coverage will need to be confirmed)  Never done   Zoster Vaccines- Shingrix (1 of 2) Never done   COVID-19 Vaccine (4 - Booster for Pfizer series) 11/17/2020   INFLUENZA VACCINE  12/30/2020   MAMMOGRAM  05/30/2021   TETANUS/TDAP  04/13/2024   DEXA SCAN  Completed   Hepatitis C Screening  Completed   PNA vac Low Risk Adult  Completed   HPV VACCINES  Aged Out    Health Maintenance  Health Maintenance Due  Topic Date Due   COLONOSCOPY (Pts 45-447yrInsurance coverage will need to be confirmed)  Never done   Zoster Vaccines- Shingrix (1 of 2) Never done  COVID-19 Vaccine (4 - Booster for Pfizer series) 11/17/2020    Colorectal cancer screening: states had in past ten years  Mammogram status: Completed 05/31/2019. Repeat every year  Bone Density status: Completed 11/29/2020.   Lung Cancer  Screening: (Low Dose CT Chest recommended if Age 54-80 years, 30 pack-year currently smoking OR have quit w/in 15years.) does not qualify.   Lung Cancer Screening Referral: no  Additional Screening:  Hepatitis C Screening: does qualify; Completed 03/06/2020  Vision Screening: Recommended annual ophthalmology exams for early detection of glaucoma and other disorders of the eye. Is the patient up to date with their annual eye exam?  Yes  Who is the provider or what is the name of the office in which the patient attends annual eye exams? [facility] If pt is not established with a provider, would they like to be referred to a provider to establish care? No .   Dental Screening: Recommended annual dental exams for proper oral hygiene  Community Resource Referral / Chronic Care Management: CRR required this visit?  No   CCM required this visit?  No      Plan:     I have personally reviewed and noted the following in the patient's chart:   Medical and social history Use of alcohol, tobacco or illicit drugs  Current medications and supplements including opioid prescriptions.  Functional ability and status Nutritional status Physical activity Advanced directives List of other physicians Hospitalizations, surgeries, and ER visits in previous 12 months Vitals Screenings to include cognitive, depression, and falls Referrals and appointments  In addition, I have reviewed and discussed with patient certain preventive protocols, quality metrics, and best practice recommendations. A written personalized care plan for preventive services as well as general preventive health recommendations were provided to patient.     Kellie Simmering, LPN   [id number]   Nurse Notes: 6 CIT not administered. Patient in a lot of pain. Appears cognitive per direct observation. PCP is going to work her in to address pain in leg.

## 2020-12-27 ENCOUNTER — Ambulatory Visit (HOSPITAL_COMMUNITY)
Admission: RE | Admit: 2020-12-27 | Discharge: 2020-12-27 | Disposition: A | Discharge status: Home / Self Care | Payer: MEDICARE | Source: Ambulatory Visit | Attending: Nurse Practitioner | Admitting: Nurse Practitioner

## 2020-12-27 DIAGNOSIS — M79604 Pain in right leg: Secondary | ICD-10-CM | POA: Diagnosis not present

## 2020-12-27 LAB — URIC ACID: Uric Acid: 5.2 mg/dL (ref 3.1–7.9)

## 2021-01-01 ENCOUNTER — Encounter: Payer: Self-pay | Admitting: Nurse Practitioner

## 2021-01-01 ENCOUNTER — Other Ambulatory Visit: Payer: Self-pay | Admitting: Nurse Practitioner

## 2021-01-01 DIAGNOSIS — M79604 Pain in right leg: Secondary | ICD-10-CM

## 2021-01-02 ENCOUNTER — Ambulatory Visit
Admission: RE | Admit: 2021-01-02 | Discharge: 2021-01-02 | Disposition: A | Discharge status: Home / Self Care | Payer: MEDICARE | Source: Ambulatory Visit | Attending: Nurse Practitioner | Admitting: Nurse Practitioner

## 2021-01-02 ENCOUNTER — Other Ambulatory Visit: Payer: Self-pay | Admitting: Nurse Practitioner

## 2021-01-02 ENCOUNTER — Encounter: Payer: Self-pay | Admitting: Nurse Practitioner

## 2021-01-02 DIAGNOSIS — M79604 Pain in right leg: Secondary | ICD-10-CM

## 2021-01-03 ENCOUNTER — Ambulatory Visit
Admission: RE | Admit: 2021-01-03 | Discharge: 2021-01-03 | Disposition: A | Discharge status: Home / Self Care | Payer: MEDICARE | Source: Ambulatory Visit | Attending: Nurse Practitioner | Admitting: Nurse Practitioner

## 2021-01-03 DIAGNOSIS — M79604 Pain in right leg: Secondary | ICD-10-CM

## 2021-01-06 ENCOUNTER — Other Ambulatory Visit: Payer: Self-pay | Admitting: Nurse Practitioner

## 2021-01-06 ENCOUNTER — Encounter: Payer: Self-pay | Admitting: Nurse Practitioner

## 2021-01-06 ENCOUNTER — Telehealth: Payer: Self-pay | Admitting: Nurse Practitioner

## 2021-01-06 DIAGNOSIS — M79604 Pain in right leg: Secondary | ICD-10-CM

## 2021-01-06 NOTE — Telephone Encounter (Signed)
Called patient to discuss her results of xray and recommendation to have a MRI of leg, I will also refer her to Dr. Alvan Dame for evaluation.

## 2021-01-07 ENCOUNTER — Ambulatory Visit: Payer: MEDICARE | Admitting: Nurse Practitioner

## 2021-01-14 ENCOUNTER — Emergency Department (HOSPITAL_BASED_OUTPATIENT_CLINIC_OR_DEPARTMENT_OTHER): Payer: MEDICARE

## 2021-01-14 ENCOUNTER — Emergency Department (HOSPITAL_BASED_OUTPATIENT_CLINIC_OR_DEPARTMENT_OTHER): Payer: MEDICARE | Admitting: Radiology

## 2021-01-14 ENCOUNTER — Other Ambulatory Visit: Payer: Self-pay

## 2021-01-14 ENCOUNTER — Encounter (HOSPITAL_BASED_OUTPATIENT_CLINIC_OR_DEPARTMENT_OTHER): Payer: Self-pay

## 2021-01-14 ENCOUNTER — Emergency Department (HOSPITAL_BASED_OUTPATIENT_CLINIC_OR_DEPARTMENT_OTHER)
Admission: EM | Admit: 2021-01-14 | Discharge: 2021-01-15 | Disposition: A | Discharge status: Home / Self Care | Payer: MEDICARE | Attending: Emergency Medicine | Admitting: Emergency Medicine

## 2021-01-14 DIAGNOSIS — Z79899 Other long term (current) drug therapy: Secondary | ICD-10-CM | POA: Diagnosis not present

## 2021-01-14 DIAGNOSIS — R591 Generalized enlarged lymph nodes: Secondary | ICD-10-CM | POA: Diagnosis not present

## 2021-01-14 DIAGNOSIS — I10 Essential (primary) hypertension: Secondary | ICD-10-CM | POA: Insufficient documentation

## 2021-01-14 DIAGNOSIS — R221 Localized swelling, mass and lump, neck: Secondary | ICD-10-CM | POA: Diagnosis present

## 2021-01-14 DIAGNOSIS — Z87891 Personal history of nicotine dependence: Secondary | ICD-10-CM | POA: Insufficient documentation

## 2021-01-14 DIAGNOSIS — Z20822 Contact with and (suspected) exposure to covid-19: Secondary | ICD-10-CM | POA: Insufficient documentation

## 2021-01-14 LAB — CBC WITH DIFFERENTIAL/PLATELET
Abs Immature Granulocytes: 0.03 10*3/uL (ref 0.00–0.07)
Basophils Absolute: 0.1 10*3/uL (ref 0.0–0.1)
Basophils Relative: 1 %
Eosinophils Absolute: 0.2 10*3/uL (ref 0.0–0.5)
Eosinophils Relative: 3 %
HCT: 40.5 % (ref 36.0–46.0)
Hemoglobin: 13.3 g/dL (ref 12.0–15.0)
Immature Granulocytes: 0 %
Lymphocytes Relative: 21 %
Lymphs Abs: 1.6 10*3/uL (ref 0.7–4.0)
MCH: 26.3 pg (ref 26.0–34.0)
MCHC: 32.8 g/dL (ref 30.0–36.0)
MCV: 80 fL (ref 80.0–100.0)
Monocytes Absolute: 1 10*3/uL (ref 0.1–1.0)
Monocytes Relative: 13 %
Neutro Abs: 4.5 10*3/uL (ref 1.7–7.7)
Neutrophils Relative %: 62 %
Platelets: 261 10*3/uL (ref 150–400)
RBC: 5.06 MIL/uL (ref 3.87–5.11)
RDW: 15.1 % (ref 11.5–15.5)
WBC: 7.4 10*3/uL (ref 4.0–10.5)
nRBC: 0 % (ref 0.0–0.2)

## 2021-01-14 LAB — URINALYSIS, ROUTINE W REFLEX MICROSCOPIC
Bilirubin Urine: NEGATIVE
Glucose, UA: NEGATIVE mg/dL
Hgb urine dipstick: NEGATIVE
Ketones, ur: NEGATIVE mg/dL
Nitrite: NEGATIVE
Protein, ur: NEGATIVE mg/dL
Specific Gravity, Urine: 1.016 (ref 1.005–1.030)
pH: 7 (ref 5.0–8.0)

## 2021-01-14 LAB — BASIC METABOLIC PANEL
Anion gap: 9 (ref 5–15)
BUN: 15 mg/dL (ref 8–23)
CO2: 28 mmol/L (ref 22–32)
Calcium: 10.4 mg/dL — ABNORMAL HIGH (ref 8.9–10.3)
Chloride: 102 mmol/L (ref 98–111)
Creatinine, Ser: 0.68 mg/dL (ref 0.44–1.00)
GFR, Estimated: >60 mL/min (ref >60)
Glucose, Bld: 104 mg/dL — ABNORMAL HIGH (ref 70–99)
Potassium: 3.9 mmol/L (ref 3.5–5.1)
Sodium: 139 mmol/L (ref 135–145)

## 2021-01-14 LAB — RESP PANEL BY RT-PCR (FLU A&B, COVID) ARPGX2
Influenza A by PCR: NEGATIVE
Influenza B by PCR: NEGATIVE
SARS Coronavirus 2 by RT PCR: NEGATIVE

## 2021-01-14 LAB — MONONUCLEOSIS SCREEN: Mono Screen: NEGATIVE

## 2021-01-14 LAB — GROUP A STREP BY PCR: Group A Strep by PCR: NOT DETECTED

## 2021-01-14 MED ORDER — IOHEXOL 350 MG/ML SOLN
75.0000 mL | Freq: Once | INTRAVENOUS | Status: AC | PRN
  Administered 2021-01-14: 75 mL via INTRAVENOUS

## 2021-01-14 NOTE — ED Provider Notes (Signed)
Care assumed from Dr. Gilford Raid.  Patient with lymphadenopathy to her neck as well as clavicular area.  Awaiting CT scan of neck and chest.  CTs are concerning for: IMPRESSION:  Left-greater-than-right cervical lymphadenopathy, including multiple  markedly enlarged left cervical nodes, most concerning for lymphoma.   1. Left-sided lateral supraclavicular and lower jugular lymphadenopathy. While this may be reactive in nature, further evaluation is recommended to exclude the presence of an underlying neoplasm.  D/w oncology Dr. Burr Medico. She took the patient's information and will arrange for office followup this week. She will need biopsy.  D/w  patient that results are concerning for lymphoma or other malignancy. She understands to contact oncology if he does not hear from them by the end of the week. Return to the ED with difficulty breathing, difficulty swallowing, any other concerns.   Ezequiel Essex, MD 01/15/21 0130

## 2021-01-14 NOTE — ED Notes (Signed)
Patient transported to CT at this time. 

## 2021-01-14 NOTE — ED Triage Notes (Signed)
Pt reports lump  to her neck since Saturday - denies fever/ trauma

## 2021-01-14 NOTE — ED Provider Notes (Signed)
[facility] EMERGENCY DEPT Provider Note   CSN: [id number] Arrival date & time: 01/14/21  2141     History Chief Complaint  Patient presents with   Mass    Deanna [name] is a 75 y.o. female.  Pt presents to the ED today with lumps to the left side of her neck.  She first noticed one lump on 8/13.  She noticed a few more today.  She said they are not painful.  They are not red.  No sore throat.  No fever.      Past Medical History:  Diagnosis Date   Abnormal glucose 03/06/2020   Age-related osteoporosis without current pathological fracture 11/22/2015   Overview:  Femoral neck  Formatting of this note might be different from the original. 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 Added automatically from request for surgery [id number]   Chronic pain syndrome 04/01/2015   Colon polyps    Essential hypertension 07/20/2000   Overview:  HBP   Female cystocele 02/27/2009   GAD (generalized anxiety disorder) 08/19/2015   GERD (gastroesophageal reflux disease)    History of Bell's palsy 03/21/2015   Hx of adenomatous colonic polyps 12/20/2008   Overview:  Colonoscopy 07/2003 - [facility] - polyps removed - recommended repeat in 3 years Colonoscopy 09/2011 - Dr. Marian Sorrow - hyperplastic polyp(s) were removed - repeat in 5 years   Hypertension    Incomplete uterovaginal prolapse 03/04/2004   Insomnia    Left bundle-branch block 05/20/2006   Lumbar radiculopathy 01/09/2016   Formatting of this note might be different from the original. Added automatically from request for surgery [id number]   LVH (left ventricular 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 05/23/2015   Non-rheumatic mitral regurgitation 03/21/2015    Osteopenia determined by x-ray 11/22/2015   Formatting of this note might be different from the original. Femoral neck   Other intervertebral disc degeneration, lumbar region 01/29/2015   Primary osteoarthritis, right hand 02/28/2002   Overview:  OA HANDS, ESP THUMBS   Radial tunnel syndrome, right 11/04/2016   Radiculopathy, lumbar region 11/22/2014   Overview:  Added automatically from request for surgery [id number]   Rhinitis, allergic 02/24/2002   Overview:  ALLERGY SYMPTOMS - states was seen by allergist in past and informed allergic to dust   Sacroiliitis ([place]) 08/02/2019   Screening for colon cancer 04/17/2005   Formatting of this note might be different from the original. 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 TRIGGER THUMBS - started spring 2003. 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 11/26/2015   Age-related osteoporosis without current pathological fracture 11/22/2015   Osteopenia determined by x-ray 11/22/2015   Allergic rhinitis  due to pollen 08/19/2015   GAD (generalized anxiety disorder) 08/19/2015   LVH (left ventricular 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 05/23/2015   Chronic pain syndrome 04/01/2015   Non-rheumatic mitral regurgitation 03/21/2015   History of Bell's palsy 03/21/2015   Other intervertebral disc degeneration, lumbar region 01/29/2015   Spinal stenosis, lumbar region without neurogenic claudication 11/22/2014   Radiculopathy, lumbar region 11/22/2014   Cervical disc disorder 05/10/2009   Female cystocele 02/27/2009   Hx of adenomatous colonic polyps 12/20/2008   Celiac disease 07/12/2006   Left bundle-branch block 05/20/2006   Chronic cough 02/15/2006   Screening for colon cancer 04/17/2005   Unspecified asthma, uncomplicated 40/03/2724   Incomplete uterovaginal prolapse 03/04/2004   Primary osteoarthritis, right hand 02/28/2002   Carpal tunnel syndrome, bilateral 02/28/2002   Trigger thumb, unspecified thumb 02/28/2002   Rhinitis, allergic 02/24/2002   Essential hypertension 07/20/2000    Past Surgical History:  Procedure Laterality Date   ABDOMINAL HYSTERECTOMY  2010   [place]   SPINAL FUSION  01/2016   TONSILLECTOMY AND ADENOIDECTOMY  1956   TOTAL HIP ARTHROPLASTY Left 09/17/2020   Procedure: TOTAL HIP ARTHROPLASTY ANTERIOR APPROACH;  Surgeon: Paralee Cancel, MD;  Location: [facility]S;  Service: Orthopedics;  Laterality: Left;  70 mins     OB History   No obstetric history on file.     Family History  Problem Relation Age of Onset   Arthritis Mother    Diabetes Mother    Pancreatic cancer Mother    Early death Father    Leukemia Father    Early death Sister    Diabetes Sister     Social History   Tobacco Use   Smoking status: Former    Types: Cigarettes    Quit date: 12/31/1997    Years since quitting: 23.0   Smokeless tobacco: Never  Vaping Use   Vaping Use: Never used  Substance Use Topics   Alcohol use: Yes   Drug use: No     Home Medications Prior to Admission medications   Medication Sig Start Date End Date Taking? Authorizing Provider  amLODipine (NORVASC) 10 MG tablet Take 1 tablet (10 mg total) by mouth daily. 10/07/20   [name], FNP  Ascorbic Acid (VITAMIN C) 1000 MG tablet Take 2,000 mg by mouth daily.    [provider]  Calcium-Magnesium (CAL-MAG PO) Take 2,000 mg by mouth at bedtime. 1000 mg    [provider]  Carboxymethylcellul-Glycerin (CLEAR EYES FOR DRY EYES OP) Place 1 drop into both eyes daily.    [provider]  Cholecalciferol (VITAMIN D PO) Take 2,000 Units by mouth daily.    [provider]  dexlansoprazole (DEXILANT) 60 MG capsule Take 1 capsule (60 mg total) by mouth daily. 07/08/20   [name], FNP  docusate sodium (COLACE) 100 MG capsule TAKE 1 CAPSULE BY MOUTH TWICE A DAY Patient not taking: No sig reported 09/30/20   [name], FNP  fexofenadine [facility] ALLERGY) 180 MG tablet Take 1 tablet (180  mg total) by mouth daily. 10/31/20   [name], FNP  hydrochlorothiazide (HYDRODIURIL) 25 MG tablet Take 1 tablet (25 mg total) by mouth 2 (two) times daily. 10/07/20   [name], FNP  hydrocortisone (ANUSOL-HC) 25 MG suppository Place 1 suppository (25 mg total) rectally 2 (two) times daily as needed for hemorrhoids or anal itching. 07/08/20   [name], FNP  losartan (COZAAR) 50 MG tablet Take 1 tablet (50 mg total) by mouth daily. 10/07/20   [name], FNP  Magnesium Salicylate 465 MG TABS Take 325 mg by mouth daily.    [provider]  methocarbamol (ROBAXIN) 500 MG tablet Take 1 tablet (500 mg total) by mouth every 6 (six) hours as needed for muscle spasms. Patient not taking: Reported on 12/26/2020 09/18/20   Irving Copas, PA-C  mometasone (NASONEX) 50 MCG/ACT nasal spray Place 2 sprays into the nose daily. 10/31/20 10/31/21  [name], FNP  montelukast (SINGULAIR) 10 MG tablet Take 1 tablet (10 mg total) by mouth at  bedtime. Patient not taking: Reported on 12/26/2020 10/07/20   [name], FNP  OVER THE COUNTER MEDICATION Take 2,000 mg by mouth daily. Pau-D'arco 1000 mg each    [provider]  polyethylene glycol (MIRALAX / GLYCOLAX) 17 g packet Take 17 g by mouth daily as needed for mild constipation. 09/18/20   Irving Copas, PA-C  Probiotic Product (PROBIOTIC DAILY PO) Take 1 tablet by mouth daily.    [provider]  Vitamin A 2400 MCG (8000 UT) CAPS Take 16,000 Units by mouth daily. 8000 units    [provider]    Allergies    Gluten meal, Codeine, Dust mite extract, Gabapentin, and Terbinafine hcl  Review of Systems   Review of Systems  HENT:         Lumps in neck  All other systems reviewed and are negative.  Physical Exam Updated Vital Signs BP (!) 144/73 (BP Location: Right Arm)   Pulse 83   Temp 98.8 F (37.1 C) (Oral)   Resp 17   Ht 5' 1"  (1.549 m)   Wt 68.4 kg   SpO2 96%   BMI 28.49 kg/m   Physical Exam Vitals and nursing note reviewed.  Constitutional:      Appearance: Normal appearance.  HENT:     Head: Normocephalic and atraumatic.     Right Ear: Tympanic membrane, ear canal and external ear normal.     Left Ear: Tympanic membrane, ear canal and external ear normal.     Nose: Nose normal.     Mouth/Throat:     Mouth: Mucous membranes are moist.     Pharynx: Oropharynx is clear.  Eyes:     Extraocular Movements: Extraocular movements intact.     Conjunctiva/sclera: Conjunctivae normal.     Pupils: Pupils are equal, round, and reactive to light.  Cardiovascular:     Rate and Rhythm: Normal rate and regular rhythm.     Pulses: Normal pulses.     Heart sounds: Normal heart sounds.  Pulmonary:     Effort: Pulmonary effort is normal.     Breath sounds: Normal breath sounds.  Abdominal:     General: Abdomen is flat. Bowel sounds are normal.     Palpations: Abdomen is soft.  Musculoskeletal:        General: Normal range of motion.      Cervical back: Normal range of motion.  Lymphadenopathy:     Cervical: Cervical adenopathy present.  Left cervical: Superficial cervical adenopathy present.  Skin:    General: Skin is warm.     Capillary Refill: Capillary refill takes less than 2 seconds.  Neurological:     General: No focal deficit present.     Mental Status: She is alert and oriented to [place], place, and time.  Psychiatric:        Mood and Affect: Mood normal.        Behavior: Behavior normal.        Thought Content: Thought content normal.        Judgment: Judgment normal.    ED Results / Procedures / Treatments   Labs (all labs ordered are listed, but only abnormal results are displayed) Labs Reviewed  GROUP A STREP BY PCR  RESP PANEL BY RT-PCR (FLU A&B, COVID) [id number]  CBC WITH DIFFERENTIAL/PLATELET  BASIC METABOLIC PANEL  URINALYSIS, ROUTINE W REFLEX MICROSCOPIC  MONONUCLEOSIS SCREEN    EKG None  Radiology DG Chest 2 View  Result Date: 01/14/2021 CLINICAL DATA:  Lymphadenopathy.  Patient reports lump on her neck. EXAM: CHEST - 2 VIEW COMPARISON:  None. FINDINGS: The heart is normal in size. Aortic atherosclerosis. Equivocal hilar prominence. No paratracheal thickening. Mild hyperinflation. Minor subsegmental atelectasis at the right lung base. No pleural fluid or pneumothorax. No pulmonary mass. No acute osseous abnormalities are seen. IMPRESSION: 1. Equivocal hilar prominence which could be related to adenopathy given provided history. 2. Mild hyperinflation with subsegmental atelectasis at the right lung base. 3.  Aortic Atherosclerosis (ICD10-I70.0). Electronically Signed   By: Keith Rake M.D.   On: 01/14/2021 22:32    Procedures Procedures   Medications Ordered in ED Medications - No data to display  ED Course  I have reviewed the triage vital signs and the nursing notes.  Pertinent labs & imaging results that were available during my care of the patient were reviewed by me and  considered in my medical decision making (see chart for details).    MDM Rules/Calculators/A&P                           Pt's labs and CT scans are pending.  She is signed out to Dr. Wyvonnia Dusky. Final Clinical Impression(s) / ED Diagnoses Final diagnoses:  Lymphadenopathy    Rx / DC Orders ED Discharge Orders     None        Isla Pence, MD 01/14/21 2312

## 2021-01-15 ENCOUNTER — Telehealth: Payer: Self-pay | Admitting: Hematology

## 2021-01-15 ENCOUNTER — Encounter (HOSPITAL_COMMUNITY): Payer: Self-pay | Admitting: Radiology

## 2021-01-15 ENCOUNTER — Encounter: Payer: Self-pay | Admitting: Nurse Practitioner

## 2021-01-15 ENCOUNTER — Encounter: Payer: Self-pay | Admitting: Hematology

## 2021-01-15 ENCOUNTER — Inpatient Hospital Stay: Payer: MEDICARE | Attending: Hematology | Admitting: Hematology

## 2021-01-15 VITALS — BP 155/64 | HR 89 | Temp 98.4°F | Resp 18 | Wt 153.3 lb

## 2021-01-15 DIAGNOSIS — I700 Atherosclerosis of aorta: Secondary | ICD-10-CM | POA: Diagnosis not present

## 2021-01-15 DIAGNOSIS — Z86010 Personal history of colonic polyps: Secondary | ICD-10-CM | POA: Insufficient documentation

## 2021-01-15 DIAGNOSIS — I10 Essential (primary) hypertension: Secondary | ICD-10-CM | POA: Insufficient documentation

## 2021-01-15 DIAGNOSIS — R918 Other nonspecific abnormal finding of lung field: Secondary | ICD-10-CM | POA: Insufficient documentation

## 2021-01-15 DIAGNOSIS — Z87891 Personal history of nicotine dependence: Secondary | ICD-10-CM | POA: Insufficient documentation

## 2021-01-15 DIAGNOSIS — R609 Edema, unspecified: Secondary | ICD-10-CM | POA: Insufficient documentation

## 2021-01-15 DIAGNOSIS — M19041 Primary osteoarthritis, right hand: Secondary | ICD-10-CM | POA: Insufficient documentation

## 2021-01-15 DIAGNOSIS — Z800 Family history of malignant neoplasm of digestive organs: Secondary | ICD-10-CM | POA: Insufficient documentation

## 2021-01-15 DIAGNOSIS — Z79899 Other long term (current) drug therapy: Secondary | ICD-10-CM | POA: Diagnosis not present

## 2021-01-15 DIAGNOSIS — R590 Localized enlarged lymph nodes: Secondary | ICD-10-CM | POA: Insufficient documentation

## 2021-01-15 DIAGNOSIS — K219 Gastro-esophageal reflux disease without esophagitis: Secondary | ICD-10-CM | POA: Diagnosis not present

## 2021-01-15 DIAGNOSIS — Z8616 Personal history of COVID-19: Secondary | ICD-10-CM | POA: Diagnosis not present

## 2021-01-15 DIAGNOSIS — M8580 Other specified disorders of bone density and structure, unspecified site: Secondary | ICD-10-CM | POA: Diagnosis not present

## 2021-01-15 DIAGNOSIS — I447 Left bundle-branch block, unspecified: Secondary | ICD-10-CM | POA: Diagnosis not present

## 2021-01-15 DIAGNOSIS — Z806 Family history of leukemia: Secondary | ICD-10-CM | POA: Diagnosis not present

## 2021-01-15 NOTE — Progress Notes (Signed)
Patient Name  [name], Deanna Petre Legal Sex  Female DOB  [name] SSN  [id number] Address  [address]  [place] [address] Phone  [phone] [facility])  [phone] (Mobile) *Preferred*    biopsy approval Received: Today Arne Cleveland, MD  Garth Bigness D Cc: Mir, Paula Libra, MD; Truitt Merle, MD Ok   [place] core biopsy L supraclav LAN  R/o lymphoma v met   DDH        Previous Messages   ----- Message -----  From: Truitt Merle, MD  Sent: 01/15/2021   2:13 PM EDT  To: Arne Cleveland, MD, Paula Libra Mir, MD, *   Drs. Hassell and Mir,   When you get a chance, could you review her recent CT neck and chest and approve her US guided node core biopsy? Per pt, she developed this diffuse adenopathy in the past 3-4 days, no fever or other symptoms, ID workup negative in ED. Please help to get her in ASAP, to rule out lymphoma or other malignancy   Thanks much   Krista Blue

## 2021-01-15 NOTE — Telephone Encounter (Signed)
Received a staff msg to schedule a new pt appt. Deanna [name] has been cld and scheduled to see Dr. Burr Medico today at 1:40pm. Aware to arrive 20 minutes early.

## 2021-01-15 NOTE — Discharge Instructions (Addendum)
As we discussed, your results are concerning for possible lymphoma but further work-up is needed.  You will be contacted by the cancer center for an appointment later this week.  If you do not hear from them, you may call them at the number above.  Return to the ED with difficulty breathing, difficulty swallowing, any other concerns.

## 2021-01-15 NOTE — Progress Notes (Signed)
[place]   [phone] Fax:([phone]) [place] Note   Patient Care Team: [name], FNP as PCP - General (General Practice)  Date of Service:  01/15/2021   CHIEF COMPLAINTS/PURPOSE OF CONSULTATION:  Cervical lymphadenopathy  REFERRING PHYSICIAN:  Dr. Ezequiel Essex (ED physician)   HISTORY OF PRESENTING ILLNESS:  Deanna [name] 75 y.o. female is a here because of cervical lymphadenopathy. The patient was referred by Dr. Wyvonnia Dusky last night when I was on-call. The patient presents to the clinic today accompanied by her daughter.   She initially noted a lump on her neck on Saturday, 01/11/21. She made an appointment with her PCP, but the lump seemed to subside. She notes yesterday she felt about 6 lumps, one of which felt to be growing. She presented to the ED (01/14/21) and underwent neck CT showing: left-greater-than-right cervical lymphadenopathy, including multiple markedly left cervical nodes, most concerning for lymphoma.  Chest CT performed at the same time showed: left-sided lateral supraclavicular and lower jugular lymphadenopathy; focal narrowing of subglottic trachea without visualization of an associated abscess or mass lesion. She notes she had a sore throat recently, but this resolved (infection panels performed in the ED were all negative).   Today the patient notes they felt/feeling prior/after... -denies any mouth discomfort, trouble swallowing, or hoarse voice.   She has a PMHx of.... -broken fibula noted 01/03/21 -GERD, Celiac -HTN -osteopenia (-2.3, 11/29/20) -s/p hip replacement 08/2020, notes pain has resolved since that time   Socially... -Her mother had pancreatic cancer at age 32. Her father and sister had leukemia. -She quit smoking in 1999. -She is divorced, has two grown children. -She is retired but continues to work part-time.   REVIEW OF SYSTEMS:    Constitutional: Denies fevers, chills or abnormal  night sweats Eyes: Denies blurriness of vision, double vision or watery eyes Ears, nose, mouth, throat, and face: Denies mucositis or sore throat Respiratory: Denies cough, dyspnea or wheezes Cardiovascular: Denies palpitation, chest discomfort or lower extremity swelling Gastrointestinal:  Denies nausea, heartburn or change in bowel habits Skin: Denies abnormal skin rashes Lymphatics: (+) left neck lymphadenopathy Neurological:Denies numbness, tingling or new weaknesses Behavioral/Psych: Mood is stable, no new changes  All other systems were reviewed with the patient and are negative.   MEDICAL HISTORY:  Past Medical History:  Diagnosis Date   Abnormal glucose 03/06/2020   Age-related osteoporosis without current pathological fracture 11/22/2015   Overview:  Femoral neck  Formatting of this note might be different from the original. 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 Added automatically from request for surgery [id number]   Chronic pain syndrome 04/01/2015   Colon polyps    Essential hypertension 07/20/2000   Overview:  HBP   Female cystocele 02/27/2009   GAD (generalized anxiety disorder) 08/19/2015   GERD (gastroesophageal reflux disease)    History of Bell's palsy 03/21/2015   Hx of adenomatous colonic polyps 12/20/2008   Overview:  Colonoscopy 07/2003 - [facility] - polyps removed - recommended repeat in 3 years Colonoscopy 09/2011 - Dr. Marian Sorrow - hyperplastic polyp(s) were removed - repeat in 5 years   Hypertension  Incomplete uterovaginal prolapse 03/04/2004   Insomnia    Left bundle-branch block 05/20/2006   Lumbar radiculopathy 01/09/2016   Formatting of this note might be  different from the original. Added automatically from request for surgery [id number]   LVH (left ventricular 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 05/23/2015   Non-rheumatic mitral regurgitation 03/21/2015   Osteopenia determined by x-ray 11/22/2015   Formatting of this note might be different from the original. Femoral neck   Other intervertebral disc degeneration, lumbar region 01/29/2015   Primary osteoarthritis, right hand 02/28/2002   Overview:  OA HANDS, ESP THUMBS   Radial tunnel syndrome, right 11/04/2016   Radiculopathy, lumbar region 11/22/2014   Overview:  Added automatically from request for surgery [id number]   Rhinitis, allergic 02/24/2002   Overview:  ALLERGY SYMPTOMS - states was seen by allergist in past and informed allergic to dust   Sacroiliitis ([place]) 08/02/2019   Screening for colon cancer 04/17/2005   Formatting of this note might be different from the original. 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 TRIGGER THUMBS - started spring 2003. Injected 02/28/02    SURGICAL HISTORY: Past Surgical History:  Procedure Laterality Date   ABDOMINAL HYSTERECTOMY  2010   [employer]   SPINAL FUSION  01/2016   TONSILLECTOMY AND ADENOIDECTOMY  1956   TOTAL HIP ARTHROPLASTY Left 09/17/2020   Procedure: TOTAL HIP ARTHROPLASTY ANTERIOR APPROACH;  Surgeon: Paralee Cancel, MD;  Location: [facility];  Service: Orthopedics;  Laterality: Left;  70 mins    SOCIAL HISTORY: Social History   Socioeconomic History   Marital status: Divorced    Spouse name: Not on file   Number of children: 2   Years of  education: 2 years college   Highest education level: Not on file  Occupational History   Occupation: Retired  Tobacco Use   Smoking status: Former    Packs/day: 0.50    Years: 30.00    Pack years: 15.00    Types: Cigarettes    Quit date: 12/31/1997    Years since quitting: 23.0   Smokeless tobacco: Never   Tobacco comments:    Quit in 1999  Vaping Use   Vaping Use: Never used  Substance and Sexual Activity   Alcohol use: Not Currently   Drug use: No   Sexual activity: Not Currently    Birth control/protection: Post-menopausal  Other Topics Concern   Not on file  Social History Narrative   Lives alone.   Right-handed.   One cup caffeine per day.   Social Determinants of Health   Financial Resource Strain: Low Risk    Difficulty of Paying Living Expenses: Not hard at all  Food Insecurity: No Food Insecurity   Worried About Charity fundraiser in the Last Year: Never true   [place] in the Last Year: Never true  Transportation Needs: No Transportation Needs   Lack of Transportation (Medical): No   Lack of Transportation (Non-Medical): No  Physical Activity: Inactive   Days of Exercise per Week: 0 days   Minutes of Exercise per Session: 0 min  Stress: Not on  file  Social Connections: Not on file  Intimate Partner Violence: Not on file    FAMILY HISTORY: Family History  Problem Relation Age of Onset   Arthritis Mother    Diabetes Mother    Pancreatic cancer Mother 77   Early death Father    Leukemia Father    Leukemia Sister    Early death Sister    Diabetes Sister     ALLERGIES:  is allergic to gluten meal, codeine, dust mite extract, gabapentin, and terbinafine hcl.  MEDICATIONS:  Current Outpatient Medications  Medication Sig Dispense Refill   amLODipine (NORVASC) 10 MG tablet Take 1 tablet (10 mg total) by mouth daily. 90 tablet 1   Ascorbic Acid (VITAMIN C) 1000 MG tablet Take 2,000 mg by mouth daily.     Calcium-Magnesium (CAL-MAG PO) Take  2,000 mg by mouth at bedtime. 1000 mg     Carboxymethylcellul-Glycerin (CLEAR EYES FOR DRY EYES OP) Place 1 drop into both eyes daily.     Cholecalciferol (VITAMIN D PO) Take 2,000 Units by mouth daily.     dexlansoprazole (DEXILANT) 60 MG capsule Take 1 capsule (60 mg total) by mouth daily. 30 capsule 5   fexofenadine (ALLEGRA ALLERGY) 180 MG tablet Take 1 tablet (180 mg total) by mouth daily. 30 tablet 2   hydrochlorothiazide (HYDRODIURIL) 25 MG tablet Take 1 tablet (25 mg total) by mouth 2 (two) times daily. 180 tablet 3   hydrocortisone (ANUSOL-HC) 25 MG suppository Place 1 suppository (25 mg total) rectally 2 (two) times daily as needed for hemorrhoids or anal itching. 12 suppository 3   losartan (COZAAR) 50 MG tablet Take 1 tablet (50 mg total) by mouth daily. 90 tablet 1   Magnesium Salicylate 768 MG TABS Take 325 mg by mouth daily.     mometasone (NASONEX) 50 MCG/ACT nasal spray Place 2 sprays into the nose daily. 1 each 2   OVER THE COUNTER MEDICATION Take 2,000 mg by mouth daily. Pau-D'arco 1000 mg each     polyethylene glycol (MIRALAX / GLYCOLAX) 17 g packet Take 17 g by mouth daily as needed for mild constipation. 14 each 0   Probiotic Product (PROBIOTIC DAILY PO) Take 1 tablet by mouth daily.     Vitamin A 2400 MCG (8000 UT) CAPS Take 16,000 Units by mouth daily. 8000 units     No current facility-administered medications for this visit.    PHYSICAL EXAMINATION: ECOG PERFORMANCE STATUS: 1 - Symptomatic but completely ambulatory  Vitals:   01/15/21 1335  BP: (!) 155/64  Pulse: 89  Resp: 18  Temp: 98.4 F (36.9 C)  SpO2: 95%   Filed Weights   01/15/21 1335  Weight: 153 lb 4.8 oz (69.5 kg)    GENERAL:alert, no distress and comfortable SKIN: skin color, texture, turgor are normal, no rashes or significant lesions EYES: normal, Conjunctiva are pink and non-injected, sclera clear  OROPHARYNX:no exudate, no erythema and lips, buccal mucosa, and tongue normal NECK:  supple, thyroid normal size, non-tender, (+) movable nodule to central neck LYMPH: (+) multiple palpable lymph nodes in the cervical region, mainly on the left; no axillary or inguinal lymphadenopathy. LUNGS: clear to auscultation and percussion with normal breathing effort HEART: regular rate & rhythm and no murmurs and no lower extremity edema ABDOMEN:abdomen soft, non-tender and normal bowel sounds Musculoskeletal:no cyanosis of digits and no clubbing  NEURO: alert & oriented x 3 with fluent speech, no focal motor/sensory deficits  LABORATORY DATA:  I have reviewed the data as listed  CBC Latest Ref Rng & Units 01/14/2021 10/07/2020 09/18/2020  WBC 4.0 - 10.5 K/uL 7.4 5.4 10.9(H)  Hemoglobin 12.0 - 15.0 g/dL 13.3 11.5 10.9(L)  Hematocrit 36.0 - 46.0 % 40.5 34.0 33.3(L)  Platelets 150 - 400 K/uL 261 368 202    CMP Latest Ref Rng & Units 01/14/2021 09/18/2020 09/16/2020  Glucose 70 - 99 mg/dL 104(H) 138(H) 113(H)  BUN 8 - 23 mg/dL 15 15 16   Creatinine 0.44 - 1.00 mg/dL 0.68 0.46 0.46  Sodium 135 - 145 mmol/L 139 137 142  Potassium 3.5 - 5.1 mmol/L 3.9 4.1 3.7  Chloride 98 - 111 mmol/L 102 104 107  CO2 22 - 32 mmol/L 28 26 26   Calcium 8.9 - 10.3 mg/dL 10.4(H) 8.9 10.2  Total Protein 6.5 - 8.1 g/dL - - 7.9  Total Bilirubin 0.3 - 1.2 mg/dL - - 0.6  Alkaline Phos 38 - 126 U/L - - 72  AST 15 - 41 U/L - - 22  ALT 0 - 44 U/L - - 19     RADIOGRAPHIC STUDIES: I have personally reviewed the radiological images as listed and agreed with the findings in the report. DG Chest 2 View  Result Date: 01/14/2021 CLINICAL DATA:  Lymphadenopathy.  Patient reports lump on her neck. EXAM: CHEST - 2 VIEW COMPARISON:  None. FINDINGS: The heart is normal in size. Aortic atherosclerosis. Equivocal hilar prominence. No paratracheal thickening. Mild hyperinflation. Minor subsegmental atelectasis at the right lung base. No pleural fluid or pneumothorax. No pulmonary mass. No acute osseous abnormalities are seen.  IMPRESSION: 1. Equivocal hilar prominence which could be related to adenopathy given provided history. 2. Mild hyperinflation with subsegmental atelectasis at the right lung base. 3.  Aortic Atherosclerosis (ICD10-I70.0). Electronically Signed   By: Keith Rake M.D.   On: 01/14/2021 22:32   DG Tibia/Fibula Right  Result Date: 01/04/2021 CLINICAL DATA:  Right ankle and lower leg pain for 2 weeks. No known injury. EXAM: RIGHT TIBIA AND FIBULA - 2 VIEW COMPARISON:  None. FINDINGS: Subtle cortical buckling in the mid fibular shaft with question of adjacent decreased bone density. No erosion or frank bony destruction. Unremarkable appearance of the tibia. Knee alignment is maintained. There is generalized soft tissue edema which is more prominent laterally. IMPRESSION: 1. Subtle cortical buckling in the mid fibular shaft with question of adjacent decreased bone density. Radiographic findings are nonspecific. This may represent stress fracture/stress reaction, however osteomyelitis or underlying focal bone lesion could have a similar appearance. Recommend further assessment with MRI. 2. Generalized soft tissue edema small more prominent laterally. These results will be called to the ordering clinician or representative by the Radiologist Assistant, and communication documented in the PACS or [employer]. Electronically Signed   By: Keith Rake M.D.   On: 01/04/2021 12:34   DG Ankle Complete Right  Result Date: 01/04/2021 CLINICAL DATA:  Right ankle and lower leg pain for 2 weeks. No known injury. EXAM: RIGHT ANKLE - COMPLETE 3+ VIEW COMPARISON:  None. FINDINGS: There is no evidence of fracture, dislocation, or joint effusion. Ankle mortise is preserved. Talar dome is intact. No erosion or periosteal reaction. There is no evidence of arthropathy or other focal bone abnormality. Mild generalized soft tissue edema. IMPRESSION: Mild generalized soft tissue edema. No osseous abnormality. Electronically  Signed   By: Keith Rake M.D.   On: 01/04/2021 12:31   CT Soft Tissue Neck W Contrast  Result Date: 01/15/2021 CLINICAL DATA:  Cervical lymphadenopathy EXAM: CT NECK WITH CONTRAST  TECHNIQUE: Multidetector CT imaging of the neck was performed using the standard protocol following the bolus administration of intravenous contrast. CONTRAST:  20m OMNIPAQUE IOHEXOL 350 MG/ML SOLN COMPARISON:  None. FINDINGS: PHARYNX AND LARYNX: The nasopharynx, oropharynx and larynx are normal. Visible portions of the oral cavity, tongue base and floor of mouth are normal. Normal epiglottis, vallecula and pyriform sinuses. The larynx is normal. No retropharyngeal abscess, effusion or lymphadenopathy. SALIVARY GLANDS: Normal parotid, submandibular and sublingual glands. THYROID: Normal. LYMPH NODES: Numerous enlarged left cervical lymph nodes. The largest node is level three and measures 17 mm. Level 5A node measures 15 mm. There is a left level 1B node measuring 13 mm. Additionally, there are numerous subcentimeter anterior and posterior chain nodes. On the right, there are multiple subcentimeter nodes within posterior and anterior cervical chains. VASCULAR: Major cervical vessels are patent. LIMITED INTRACRANIAL: Normal. VISUALIZED ORBITS: Normal. MASTOIDS AND VISUALIZED PARANASAL SINUSES: No fluid levels or advanced mucosal thickening. No mastoid effusion. SKELETON: No bony spinal canal stenosis. No lytic or blastic lesions. UPPER CHEST: Clear. OTHER: None. IMPRESSION: Left-greater-than-right cervical lymphadenopathy, including multiple markedly enlarged left cervical nodes, most concerning for lymphoma. Electronically Signed   By: KUlyses JarredM.D.   On: 01/15/2021 00:15   CT Chest W Contrast  Result Date: 01/15/2021 CLINICAL DATA:  Patient reports a lump on her neck since Saturday. EXAM: CT CHEST WITH CONTRAST TECHNIQUE: Multidetector CT imaging of the chest was performed during intravenous contrast administration.  CONTRAST:  735mOMNIPAQUE IOHEXOL 350 MG/ML SOLN COMPARISON:  None. FINDINGS: Cardiovascular: There is moderate to marked severity calcification of the aortic arch and descending thoracic aorta. Normal heart size. No pericardial effusion. Mediastinum/Nodes: There is mild bilateral axillary lymphadenopathy (the largest axillary lymph node is seen on the right and measures approximately 1.3 cm). Enlarged lateral supraclavicular and lower jugular lymph nodes are seen on the left. The largest measures approximately 1.9 cm (axial CT image 9, CT series 2). Focal narrowing of the subglottic trachea is seen. Thyroid gland and esophagus demonstrate no significant findings. Lungs/Pleura: Lungs are clear. No pleural effusion or pneumothorax. Upper Abdomen: There is diffuse fatty infiltration of the liver parenchyma. Musculoskeletal: Degenerative changes seen throughout the thoracic spine. IMPRESSION: 1. Left-sided lateral supraclavicular and lower jugular lymphadenopathy. While this may be reactive in nature, further evaluation is recommended to exclude the presence of an underlying neoplasm. 2. Focal narrowing of the subglottic trachea without visualization of an associated abscess or mass lesion. Further evaluation with contrast enhanced neck CT is recommended. Electronically Signed   By: ThVirgina Norfolk.D.   On: 01/15/2021 00:29   VAS US[place]OWER EXTREMITY VENOUS (DVT)  Result Date: 12/27/2020  Lower Venous DVT Study Patient Name:  Deanna [name]  Date of Exam:   12/27/2020 Medical Rec #: [id number]      Accession #:    [id number] of Birth: 1112/05/1946      Patient Gender: F Patient Age:   74Y [facility] Procedure:      VAS US[place]OWER EXTREMITY VENOUS (DVT) Referring Phys: [id number] Brine-------------------------------------------------------------------------------  Indications: Right leg painain. Other Indications: History of spinal fusion. Comparison Study: No prior exam  Performing Technologist: BaHelene KelpExamination Guidelines: A complete evaluation includes B-mode imaging, spectral Doppler, color Doppler, and power Doppler as needed of all accessible portions of each vessel. Bilateral testing is considered an integral part of a complete examination. Limited examinations for reoccurring indications may  be performed as noted. The reflux portion of the exam is performed with the patient in reverse Trendelenburg.  +---------+---------------+---------+-----------+----------+--------------+ RIGHT    CompressibilityPhasicitySpontaneityPropertiesThrombus Aging +---------+---------------+---------+-----------+----------+--------------+ CFV      Full           Yes      Yes                                 +---------+---------------+---------+-----------+----------+--------------+ SFJ      Full           Yes      Yes                                 +---------+---------------+---------+-----------+----------+--------------+ FV Prox  Full           Yes      Yes                                 +---------+---------------+---------+-----------+----------+--------------+ FV Mid   Full           Yes      Yes                                 +---------+---------------+---------+-----------+----------+--------------+ FV DistalFull           Yes      Yes                                 +---------+---------------+---------+-----------+----------+--------------+ PFV      Full           Yes      Yes                                 +---------+---------------+---------+-----------+----------+--------------+ POP      Full           Yes      Yes                                 +---------+---------------+---------+-----------+----------+--------------+ PTV      Full           Yes      Yes                                 +---------+---------------+---------+-----------+----------+--------------+ PERO     Full           Yes      Yes                                  +---------+---------------+---------+-----------+----------+--------------+ Gastroc  Full           Yes      Yes                                 +---------+---------------+---------+-----------+----------+--------------+ GSV      Full  Yes      Yes                                 +---------+---------------+---------+-----------+----------+--------------+ SSV      Full           Yes      Yes                                 +---------+---------------+---------+-----------+----------+--------------+   +----+---------------+---------+-----------+----------+--------------+ LEFTCompressibilityPhasicitySpontaneityPropertiesThrombus Aging +----+---------------+---------+-----------+----------+--------------+ CFV Full           Yes      Yes                                 +----+---------------+---------+-----------+----------+--------------+     Summary: RIGHT: - There is no evidence of acute deep vein thrombosis in the lower extremity. - There is no evidence of acute superficial venous thrombosis.   *See table(s) above for measurements and observations. Electronically signed by Servando Snare MD on 12/27/2020 at 10:50:32 AM.    Final     ASSESSMENT & PLAN:  Deanna [name] is a 75 y.o.  female with a history of   1. Cervical Lymphadenopathy -new since 01/11/21, neck CT on 01/14/21 confirmed multiple markedly enlarged left cervical nodes. -accompanying chest CT showed: left-sided lateral supraclavicular and lower jugular lymphadenopathy.  No lesions in bilateral lung or mediastinum. -Lab from last night in the ED showed negative strep, negative mono, and a negative for COVID and influenza. -I discussed that this could be reactive to infection, lymphoma (especially high-grade), or a form of cancer, especially head neck primary.  I recommend ultrasound-guided left cervical lymph node biopsy by IR as soon as possible, given the rapid growth.  -We  discussed if the biopsy confirmed malignancy, we may need additional scan or procedure.  -I personally contacted IR and will get her biopsy scheduled ASAP   2. Covid-19+ 09/01/20 -recovered well, no lingering concerns   PLAN:  -urgent referral to IR for US-guided biopsy of left supraclavicular lymph node -Follow-up 2 to 3 days after biopsy    Orders Placed This Encounter  Procedures   [place] CORE BIOPSY (LYMPH NODES)    Standing Status:   Future    Standing Expiration Date:   01/15/2022    Order Specific Question:   Lab orders requested (DO NOT place separate lab orders, these will be automatically ordered during procedure specimen collection):    Answer:   Surgical Pathology    Order Specific Question:   Reason for Exam (SYMPTOM  OR DIAGNOSIS REQUIRED)    Answer:   rapid development of left cervical adenopathy, rule out lymphoma and other malignancy    Order Specific Question:   Preferred location?    Answer:   [facility]     All questions were answered. The patient knows to call the clinic with any problems, questions or concerns. The total time spent in the appointment was 40 minutes.     Truitt Merle, MD 01/15/2021 6:00 PM  I, Wilburn Mylar, am acting as scribe for Truitt Merle, MD.   I have reviewed the above documentation for accuracy and completeness, and I agree with the above.

## 2021-01-16 ENCOUNTER — Telehealth: Payer: Self-pay

## 2021-01-16 ENCOUNTER — Ambulatory Visit: Payer: MEDICARE | Admitting: Nurse Practitioner

## 2021-01-16 NOTE — Telephone Encounter (Signed)
The pt was asked how she is doing.  The pt said that she is doing ok, not in any pain or anything.  The pt was scheduled for a f/u with Laurance Flatten, DNP, FNP-BC for next week after her biopsy on Monday.

## 2021-01-17 ENCOUNTER — Other Ambulatory Visit: Payer: Self-pay | Admitting: Student

## 2021-01-20 ENCOUNTER — Ambulatory Visit (HOSPITAL_COMMUNITY)
Admission: RE | Admit: 2021-01-20 | Discharge: 2021-01-20 | Disposition: A | Discharge status: Home / Self Care | Payer: MEDICARE | Source: Ambulatory Visit | Attending: Hematology | Admitting: Hematology

## 2021-01-20 ENCOUNTER — Encounter (HOSPITAL_COMMUNITY): Payer: Self-pay

## 2021-01-20 ENCOUNTER — Other Ambulatory Visit: Payer: Self-pay

## 2021-01-20 DIAGNOSIS — R590 Localized enlarged lymph nodes: Secondary | ICD-10-CM

## 2021-01-20 DIAGNOSIS — C8511 Unspecified B-cell lymphoma, lymph nodes of head, face, and neck: Secondary | ICD-10-CM | POA: Insufficient documentation

## 2021-01-20 MED ORDER — MIDAZOLAM HCL 2 MG/2ML IJ SOLN
INTRAMUSCULAR | Status: AC | PRN
  Administered 2021-01-20 (×2): 1 mg via INTRAVENOUS

## 2021-01-20 MED ORDER — SODIUM CHLORIDE 0.9 % IV SOLN: INTRAVENOUS | Status: DC

## 2021-01-20 MED ORDER — MIDAZOLAM HCL 2 MG/2ML IJ SOLN
INTRAMUSCULAR | Status: AC
  Filled 2021-01-20: qty 2

## 2021-01-20 MED ORDER — FENTANYL CITRATE (PF) 100 MCG/2ML IJ SOLN
INTRAMUSCULAR | Status: AC
  Filled 2021-01-20: qty 2

## 2021-01-20 MED ORDER — FENTANYL CITRATE (PF) 100 MCG/2ML IJ SOLN
INTRAMUSCULAR | Status: AC | PRN
  Administered 2021-01-20 (×2): 50 ug via INTRAVENOUS

## 2021-01-20 MED ORDER — LIDOCAINE HCL 1 % IJ SOLN
INTRAMUSCULAR | Status: AC
  Filled 2021-01-20: qty 20

## 2021-01-20 MED ORDER — LIDOCAINE HCL (PF) 1 % IJ SOLN
INTRAMUSCULAR | Status: AC | PRN
  Administered 2021-01-20: 10 mL

## 2021-01-20 NOTE — H&P (Signed)
Chief Complaint: Patient was seen in consultation today for lymph node biopsy   at the request of Feng,Yan  Referring Physician(s): Feng,Yan  Supervising Physician: Corrie Mckusick  Patient Status: Deanna [name] - Out-pt  History of Present Illness: Deanna [name] is a 75 y.o. female w/ hx of celiac disease, chronic pain syndrome, essential HTN, GAD, GERD, Bell's palsy, LBBB, LVH, former smoker (1999).   She is here today for a lymph node biopsy referred by Dr. Ky Barban d/t rapid development of left cervical adenopathy, rule out lymphoma and other malignancy.   Deanna [name] is pleasant, calm and cooperative.  She has no c/o. She is in NAD  Past Medical History:  Diagnosis Date   Abnormal glucose 03/06/2020   Age-related osteoporosis without current pathological fracture 11/22/2015   Overview:  Femoral neck  Formatting of this note might be different from the original. 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 Added automatically from request for surgery [id number]   Chronic pain syndrome 04/01/2015   Colon polyps    Essential hypertension 07/20/2000   Overview:  HBP   Female cystocele 02/27/2009   GAD (generalized anxiety disorder) 08/19/2015   GERD (gastroesophageal reflux disease)    History of Bell's palsy 03/21/2015   Hx of adenomatous colonic polyps 12/20/2008   Overview:  Colonoscopy 07/2003 - [facility] - polyps removed - recommended repeat in 3 years Colonoscopy 09/2011 - Dr. Marian Sorrow - hyperplastic polyp(s) were removed - repeat in 5 years   Hypertension    Incomplete uterovaginal prolapse 03/04/2004   Insomnia    Left bundle-branch block 05/20/2006   Lumbar radiculopathy 01/09/2016    Formatting of this note might be different from the original. Added automatically from request for surgery [id number]   LVH (left ventricular 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 05/23/2015   Non-rheumatic mitral regurgitation 03/21/2015   Osteopenia determined by x-ray 11/22/2015   Formatting of this note might be different from the original. Femoral neck   Other intervertebral disc degeneration, lumbar region 01/29/2015   Primary osteoarthritis, right hand 02/28/2002   Overview:  OA HANDS, ESP THUMBS   Radial tunnel syndrome, right 11/04/2016   Radiculopathy, lumbar region 11/22/2014   Overview:  Added automatically from request for surgery [id number]   Rhinitis, allergic 02/24/2002   Overview:  ALLERGY SYMPTOMS - states was seen by allergist in past and informed allergic to dust   Sacroiliitis ([place]) 08/02/2019   Screening for colon cancer 04/17/2005   Formatting of this note might be different from the original. 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 TRIGGER THUMBS - started spring 2003. Injected 02/28/02    Past Surgical History:  Procedure Laterality Date   ABDOMINAL HYSTERECTOMY  2010   [place]   SPINAL FUSION  01/2016   TONSILLECTOMY  AND ADENOIDECTOMY  1956   TOTAL HIP ARTHROPLASTY Left 09/17/2020   Procedure: TOTAL HIP ARTHROPLASTY ANTERIOR APPROACH;  Surgeon: Paralee Cancel, MD;  Location: [facility];  Service: Orthopedics;  Laterality: Left;  70 mins    Allergies: Gluten meal, Codeine, Dust mite extract, Gabapentin, and Terbinafine hcl  Medications: Prior to Admission medications   Medication  Sig Start Date End Date Taking? Authorizing Provider  amLODipine (NORVASC) 10 MG tablet Take 1 tablet (10 mg total) by mouth daily. 10/07/20  Yes [name], FNP  Ascorbic Acid (VITAMIN C) 1000 MG tablet Take 2,000 mg by mouth daily.   Yes [provider]  Calcium-Magnesium (CAL-MAG PO) Take 2,000 mg by mouth at bedtime. 1000 mg   Yes [provider]  Carboxymethylcellul-Glycerin (CLEAR EYES FOR DRY EYES OP) Place 1 drop into both eyes daily.   Yes [provider]  Cholecalciferol (VITAMIN D PO) Take 2,000 Units by mouth daily.   Yes [provider]  dexlansoprazole (DEXILANT) 60 MG capsule Take 1 capsule (60 mg total) by mouth daily. 07/08/20  Yes [name], FNP  fexofenadine [facility] ALLERGY) 180 MG tablet Take 1 tablet (180 mg total) by mouth daily. 10/31/20  Yes [name], FNP  hydrochlorothiazide (HYDRODIURIL) 25 MG tablet Take 1 tablet (25 mg total) by mouth 2 (two) times daily. 10/07/20  Yes [name], FNP  hydrocortisone (ANUSOL-HC) 25 MG suppository Place 1 suppository (25 mg total) rectally 2 (two) times daily as needed for hemorrhoids or anal itching. 07/08/20  Yes [name], FNP  losartan (COZAAR) 50 MG tablet Take 1 tablet (50 mg total) by mouth daily. 10/07/20  Yes [name], FNP  Magnesium Salicylate 811 MG TABS Take 325 mg by mouth daily.   Yes [provider]  mometasone (NASONEX) 50 MCG/ACT nasal spray Place 2 sprays into the nose daily. 10/31/20 10/31/21 Yes [name], FNP  OVER THE COUNTER MEDICATION Take 2,000 mg by mouth daily. Pau-D'arco 1000 mg each   Yes [provider]  polyethylene glycol (MIRALAX / GLYCOLAX) 17 g packet Take 17 g by mouth daily as needed for mild constipation. 09/18/20  Yes Irving Copas, PA-C  Probiotic Product (PROBIOTIC DAILY PO) Take 1 tablet by mouth daily.   Yes [provider]  Vitamin A 2400 MCG (8000 UT) CAPS Take 16,000 Units by mouth daily. 8000 units   Yes [provider]     Family History  Problem Relation Age of Onset   Arthritis Mother    Diabetes Mother    Pancreatic cancer Mother 11   Early death Father    Leukemia Father    Leukemia Sister    Early death Sister    Diabetes Sister     Social History   Socioeconomic History   Marital status: Divorced    Spouse name: Not on file   Number of children: 2   Years of education: 2 years college   Highest education level: Not on file  Occupational History   Occupation: Retired  Tobacco Use   Smoking status: Former    Packs/day: 0.50    Years: 30.00    Pack years: 15.00    Types: Cigarettes    Quit date: 12/31/1997    Years since quitting: 23.0   Smokeless tobacco: Never   Tobacco comments:    Quit in 1999  Vaping Use   Vaping Use: Never used  Substance and Sexual Activity   Alcohol use: Not Currently   Drug use: No  Sexual activity: Not Currently    Birth control/protection: Post-menopausal  Other Topics Concern   Not on file  Social History Narrative   Lives alone.   Right-handed.   One cup caffeine per day.   Social Determinants of Health   Financial Resource Strain: Low Risk    Difficulty of Paying Living Expenses: Not hard at all  Food Insecurity: No Food Insecurity   Worried About Charity fundraiser in the Last Year: Never true   [place] in the Last Year: Never true  Transportation Needs: No Transportation Needs   Lack of Transportation (Medical): No   Lack of Transportation (Non-Medical): No  Physical Activity: Inactive   Days of Exercise per Week: 0 days   Minutes of Exercise per Session: 0 min  Stress: Not on file  Social Connections: Not on file      Review of Systems  Constitutional:  Negative for chills and fever.  Respiratory:  Negative for shortness of breath.   Cardiovascular:  Negative for chest pain.  Gastrointestinal:  Negative for abdominal pain, diarrhea and vomiting.   Vital Signs: There were no vitals taken for this  visit.  Physical Exam Constitutional:      Appearance: Normal appearance. She is not ill-appearing, toxic-appearing or diaphoretic.  HENT:     Head: Normocephalic and atraumatic.     Mouth/Throat:     Mouth: Mucous membranes are moist.     Pharynx: Oropharynx is clear.  Cardiovascular:     Rate and Rhythm: Normal rate and regular rhythm.  Pulmonary:     Effort: Pulmonary effort is normal.     Breath sounds: No stridor. No wheezing, rhonchi or rales.  Abdominal:     General: There is no distension.     Palpations: Abdomen is soft.     Tenderness: There is no abdominal tenderness. There is no guarding.  Musculoskeletal:        General: Signs of injury present.     Right lower leg: No edema.     Left lower leg: No edema.     Comments: Pt has R cam walker in place. She reports she has fibula fx from a few weeks ago  Skin:    General: Skin is warm and dry.  Neurological:     Mental Status: She is alert and oriented to [place], place, and time.  Psychiatric:        Mood and Affect: Mood normal.        Behavior: Behavior normal.        Thought Content: Thought content normal.        Judgment: Judgment normal.    Imaging: DG Chest 2 View  Result Date: 01/14/2021 CLINICAL DATA:  Lymphadenopathy.  Patient reports lump on her neck. EXAM: CHEST - 2 VIEW COMPARISON:  None. FINDINGS: The heart is normal in size. Aortic atherosclerosis. Equivocal hilar prominence. No paratracheal thickening. Mild hyperinflation. Minor subsegmental atelectasis at the right lung base. No pleural fluid or pneumothorax. No pulmonary mass. No acute osseous abnormalities are seen. IMPRESSION: 1. Equivocal hilar prominence which could be related to adenopathy given provided history. 2. Mild hyperinflation with subsegmental atelectasis at the right lung base. 3.  Aortic Atherosclerosis (ICD10-I70.0). Electronically Signed   By: Keith Rake M.D.   On: 01/14/2021 22:32   DG Tibia/Fibula Right  Result Date:  01/04/2021 CLINICAL DATA:  Right ankle and lower leg pain for 2 weeks. No known injury. EXAM: RIGHT TIBIA AND FIBULA - 2  VIEW COMPARISON:  None. FINDINGS: Subtle cortical buckling in the mid fibular shaft with question of adjacent decreased bone density. No erosion or frank bony destruction. Unremarkable appearance of the tibia. Knee alignment is maintained. There is generalized soft tissue edema which is more prominent laterally. IMPRESSION: 1. Subtle cortical buckling in the mid fibular shaft with question of adjacent decreased bone density. Radiographic findings are nonspecific. This may represent stress fracture/stress reaction, however osteomyelitis or underlying focal bone lesion could have a similar appearance. Recommend further assessment with MRI. 2. Generalized soft tissue edema small more prominent laterally. These results will be called to the ordering clinician or representative by the Radiologist Assistant, and communication documented in the PACS or [employer]. Electronically Signed   By: Keith Rake M.D.   On: 01/04/2021 12:34   DG Ankle Complete Right  Result Date: 01/04/2021 CLINICAL DATA:  Right ankle and lower leg pain for 2 weeks. No known injury. EXAM: RIGHT ANKLE - COMPLETE 3+ VIEW COMPARISON:  None. FINDINGS: There is no evidence of fracture, dislocation, or joint effusion. Ankle mortise is preserved. Talar dome is intact. No erosion or periosteal reaction. There is no evidence of arthropathy or other focal bone abnormality. Mild generalized soft tissue edema. IMPRESSION: Mild generalized soft tissue edema. No osseous abnormality. Electronically Signed   By: Keith Rake M.D.   On: 01/04/2021 12:31   CT Soft Tissue Neck W Contrast  Result Date: 01/15/2021 CLINICAL DATA:  Cervical lymphadenopathy EXAM: CT NECK WITH CONTRAST TECHNIQUE: Multidetector CT imaging of the neck was performed using the standard protocol following the bolus administration of intravenous contrast.  CONTRAST:  93m OMNIPAQUE IOHEXOL 350 MG/ML SOLN COMPARISON:  None. FINDINGS: PHARYNX AND LARYNX: The nasopharynx, oropharynx and larynx are normal. Visible portions of the oral cavity, tongue base and floor of mouth are normal. Normal epiglottis, vallecula and pyriform sinuses. The larynx is normal. No retropharyngeal abscess, effusion or lymphadenopathy. SALIVARY GLANDS: Normal parotid, submandibular and sublingual glands. THYROID: Normal. LYMPH NODES: Numerous enlarged left cervical lymph nodes. The largest node is level three and measures 17 mm. Level 5A node measures 15 mm. There is a left level 1B node measuring 13 mm. Additionally, there are numerous subcentimeter anterior and posterior chain nodes. On the right, there are multiple subcentimeter nodes within posterior and anterior cervical chains. VASCULAR: Major cervical vessels are patent. LIMITED INTRACRANIAL: Normal. VISUALIZED ORBITS: Normal. MASTOIDS AND VISUALIZED PARANASAL SINUSES: No fluid levels or advanced mucosal thickening. No mastoid effusion. SKELETON: No bony spinal canal stenosis. No lytic or blastic lesions. UPPER CHEST: Clear. OTHER: None. IMPRESSION: Left-greater-than-right cervical lymphadenopathy, including multiple markedly enlarged left cervical nodes, most concerning for lymphoma. Electronically Signed   By: KUlyses JarredM.D.   On: 01/15/2021 00:15   CT Chest W Contrast  Result Date: 01/15/2021 CLINICAL DATA:  Patient reports a lump on her neck since Saturday. EXAM: CT CHEST WITH CONTRAST TECHNIQUE: Multidetector CT imaging of the chest was performed during intravenous contrast administration. CONTRAST:  73mOMNIPAQUE IOHEXOL 350 MG/ML SOLN COMPARISON:  None. FINDINGS: Cardiovascular: There is moderate to marked severity calcification of the aortic arch and descending thoracic aorta. Normal heart size. No pericardial effusion. Mediastinum/Nodes: There is mild bilateral axillary lymphadenopathy (the largest axillary lymph node is  seen on the right and measures approximately 1.3 cm). Enlarged lateral supraclavicular and lower jugular lymph nodes are seen on the left. The largest measures approximately 1.9 cm (axial CT image 9, CT series 2). Focal narrowing of the subglottic trachea is  seen. Thyroid gland and esophagus demonstrate no significant findings. Lungs/Pleura: Lungs are clear. No pleural effusion or pneumothorax. Upper Abdomen: There is diffuse fatty infiltration of the liver parenchyma. Musculoskeletal: Degenerative changes seen throughout the thoracic spine. IMPRESSION: 1. Left-sided lateral supraclavicular and lower jugular lymphadenopathy. While this may be reactive in nature, further evaluation is recommended to exclude the presence of an underlying neoplasm. 2. Focal narrowing of the subglottic trachea without visualization of an associated abscess or mass lesion. Further evaluation with contrast enhanced neck CT is recommended. Electronically Signed   By: Virgina Norfolk M.D.   On: 01/15/2021 00:29   VAS [place] LOWER EXTREMITY VENOUS (DVT)  Result Date: 12/27/2020  Lower Venous DVT Study Patient Name:  Grisell A Schiff  Date of Exam:   12/27/2020 Medical Rec #: [id number]        Accession #:    [id number] Date of Birth: Jul 13, 1945        Patient Gender: F Patient Age:   74Y Exam Location:  [facility] Procedure:      VAS [place] LOWER EXTREMITY VENOUS (DVT) Referring Phys: [id number] [name] --------------------------------------------------------------------------------  Indications: Right leg painain. Other Indications: History of spinal fusion. Comparison Study: No prior exam Performing Technologist: Helene Kelp  Examination Guidelines: A complete evaluation includes B-mode imaging, spectral Doppler, color Doppler, and power Doppler as needed of all accessible portions of each vessel. Bilateral testing is considered an integral part of a complete examination. Limited examinations for reoccurring indications  may be performed as noted. The reflux portion of the exam is performed with the patient in reverse Trendelenburg.  +---------+---------------+---------+-----------+----------+--------------+ RIGHT    CompressibilityPhasicitySpontaneityPropertiesThrombus Aging +---------+---------------+---------+-----------+----------+--------------+ CFV      Full           Yes      Yes                                 +---------+---------------+---------+-----------+----------+--------------+ SFJ      Full           Yes      Yes                                 +---------+---------------+---------+-----------+----------+--------------+ FV Prox  Full           Yes      Yes                                 +---------+---------------+---------+-----------+----------+--------------+ FV Mid   Full           Yes      Yes                                 +---------+---------------+---------+-----------+----------+--------------+ FV DistalFull           Yes      Yes                                 +---------+---------------+---------+-----------+----------+--------------+ PFV      Full           Yes      Yes                                 +---------+---------------+---------+-----------+----------+--------------+  POP      Full           Yes      Yes                                 +---------+---------------+---------+-----------+----------+--------------+ PTV      Full           Yes      Yes                                 +---------+---------------+---------+-----------+----------+--------------+ PERO     Full           Yes      Yes                                 +---------+---------------+---------+-----------+----------+--------------+ Gastroc  Full           Yes      Yes                                 +---------+---------------+---------+-----------+----------+--------------+ GSV      Full           Yes      Yes                                  +---------+---------------+---------+-----------+----------+--------------+ SSV      Full           Yes      Yes                                 +---------+---------------+---------+-----------+----------+--------------+   +----+---------------+---------+-----------+----------+--------------+ LEFTCompressibilityPhasicitySpontaneityPropertiesThrombus Aging +----+---------------+---------+-----------+----------+--------------+ CFV Full           Yes      Yes                                 +----+---------------+---------+-----------+----------+--------------+     Summary: RIGHT: - There is no evidence of acute deep vein thrombosis in the lower extremity. - There is no evidence of acute superficial venous thrombosis.   *See table(s) above for measurements and observations. Electronically signed by Servando Snare MD on 12/27/2020 at 10:50:32 AM.    Final     Labs:  CBC: Recent Labs    09/16/20 1354 09/18/20 0259 10/07/20 1130 01/14/21 2234  WBC 6.6 10.9* 5.4 7.4  HGB 14.0 10.9* 11.5 13.3  HCT 42.9 33.3* 34.0 40.5  PLT 243 202 368 261    COAGS: Recent Labs    09/16/20 1354  INR 1.1  APTT 26    BMP: Recent Labs    03/06/20 1706 09/16/20 1354 09/18/20 0259 01/14/21 2234  NA 142 142 137 139  K 3.7 3.7 4.1 3.9  CL 103 107 104 102  CO2 25 26 26 28   GLUCOSE 104* 113* 138* 104*  BUN 12 16 15 15   CALCIUM 10.1 10.2 8.9 10.4*  CREATININE 0.63 0.46 0.46 0.68  GFRNONAA 90 >60 >60 >60  GFRAA 104  --   --   --     LIVER FUNCTION TESTS:  Recent Labs    09/16/20 1354  BILITOT 0.6  AST 22  ALT 19  ALKPHOS 72  PROT 7.9  ALBUMIN 4.5    TUMOR MARKERS: No results for input(s): AFPTM, CEA, CA199, CHROMGRNA in the last 8760 hours.  Assessment and Plan:  Hx of celiac disease, chronic pain syndrome, essential HTN, GAD, GERD, Bell's palsy, LBBB, LVH, former smoker (1999).   She is here today for a lymph node biopsy referred by Dr. Ky Barban d/t rapid development of left  cervical adenopathy,rule out lymphoma and other malignancy.   She is pleasant, calm and cooperative.  She has no c/o. She is in NAD  Risks and benefits of lymph node biopsy was discussed with the patient and/or patient's family including, but not limited to bleeding, infection, damage to adjacent structures or low yield requiring additional tests.  All of the questions were answered and there is agreement to proceed.  Consent signed and in chart.  VSS Today's labs pending Labs from 8/16, WNL   Thank you for this interesting consult.  I greatly enjoyed meeting Alayna A Fera and look forward to participating in their care.  A copy of this report was sent to the requesting provider on this date.  Electronically Signed: Tyson Alias, NP 01/20/2021, 12:39 PM   I spent a total of 30 minutes in face to face in clinical consultation, greater than 50% of which was counseling/coordinating care for lymph node biopsy.

## 2021-01-20 NOTE — Procedures (Signed)
Interventional Radiology Procedure Note  Procedure: US guided biopsy of left neck adenopathy.  Complications: None EBL: None  Recommendations: - Bedrest 1 hours.   - Routine wound care - Follow up pathology - Advance diet   Signed,  Corrie Mckusick, DO

## 2021-01-20 NOTE — Patient Instructions (Addendum)
Error

## 2021-01-20 NOTE — Discharge Instructions (Signed)
Please call Interventional Radiology clinic [phone] with any questions or concerns.   You may remove your dressing and shower tomorrow.    Needle Biopsy, Care After These instructions tell you how to care for yourself after your procedure. Your doctor may also give you more specific instructions. Call your doctor if Deanna [name] any problems or questions. What can I expect after the procedure? After the procedure, it is common to have: Soreness. Bruising. Mild pain. Follow these instructions at home:  Return to your normal activities as told by your doctor. Ask your doctor what activities are safe for you. Take over-the-counter and prescription medicines only as told by your doctor. Wash your hands with soap and water before you change your bandage (dressing). If you cannot use soap and water, use hand sanitizer. Follow instructions from your doctor about: How to take care of your puncture site. When and how to change your bandage. When to remove your bandage. Check your puncture site every day for signs of infection. Watch for: Redness, swelling, or pain. Fluid or blood. Pus or a bad smell. Warmth. Do not take baths, swim, or use a hot tub until your doctor approves. Ask your doctor if you may take showers. You may only be allowed to take sponge baths. Keep all follow-up visits as told by your doctor. This is important. Contact a doctor if you have: A fever. Redness, swelling, or pain at the puncture site, and it lasts longer than a few days. Fluid, blood, or pus coming from the puncture site. Warmth coming from the puncture site. Get help right away if: You have a lot of bleeding from the puncture site. Summary After the procedure, it is common to have soreness, bruising, or mild pain at the puncture site. Check your puncture site every day for signs of infection, such as redness, swelling, or pain. Get help right away if you have severe bleeding from your puncture  site. This information is not intended to replace advice given to you by your health care provider. Make sure you discuss any questions you have with your healthcare provider. Document Revised: 11/16/2019 Document Reviewed: 11/16/2019 Elsevier Patient Education  [place].   Moderate Conscious Sedation, Adult, Care After This sheet gives you information about how to care for yourself after your procedure. Your health care provider may also give you more specific instructions. If you have problems or questions, contact your health care provider. What can I expect after the procedure? After the procedure, it is common to have: Sleepiness for several hours. Impaired judgment for several hours. Difficulty with balance. Vomiting if you eat too soon. Follow these instructions at home: For the time period you were told by your health care provider: Rest. Do not participate in activities where you could fall or become injured. Do not drive or use machinery. Do not drink alcohol. Do not take sleeping pills or medicines that cause drowsiness. Do not make important decisions or sign legal documents. Do not take care of children on your own.      Eating and drinking Follow the diet recommended by your health care provider. Drink enough fluid to keep your urine pale yellow. If you vomit: Drink water, juice, or soup when you can drink without vomiting. Make sure you have little or no nausea before eating solid foods.   General instructions Take over-the-counter and prescription medicines only as told by your health care provider. Have a responsible adult stay with you for the time you are told.  It is important to have someone help care for you until you are awake and alert. Do not smoke. Keep all follow-up visits as told by your health care provider. This is important. Contact a health care provider if: You are still sleepy or having trouble with balance after 24 hours. You feel  light-headed. You keep feeling nauseous or you keep vomiting. You develop a rash. You have a fever. You have redness or swelling around the IV site. Get help right away if: You have trouble breathing. You have new-onset confusion at home. Summary After the procedure, it is common to feel sleepy, have impaired judgment, or feel nauseous if you eat too soon. Rest after you get home. Know the things you should not do after the procedure. Follow the diet recommended by your health care provider and drink enough fluid to keep your urine pale yellow. Get help right away if you have trouble breathing or new-onset confusion at home. This information is not intended to replace advice given to you by your health care provider. Make sure you discuss any questions you have with your health care provider. Document Revised: 09/15/2019 Document Reviewed: 04/13/2019 Elsevier Patient Education  2021 [employer].

## 2021-01-22 LAB — SURGICAL PATHOLOGY

## 2021-01-23 ENCOUNTER — Encounter: Payer: Self-pay | Admitting: Nurse Practitioner

## 2021-01-23 ENCOUNTER — Telehealth: Payer: Self-pay | Admitting: Hematology and Oncology

## 2021-01-23 ENCOUNTER — Other Ambulatory Visit: Payer: Self-pay

## 2021-01-23 ENCOUNTER — Ambulatory Visit (INDEPENDENT_AMBULATORY_CARE_PROVIDER_SITE_OTHER): Payer: MEDICARE | Admitting: Nurse Practitioner

## 2021-01-23 ENCOUNTER — Other Ambulatory Visit: Payer: Self-pay | Admitting: Hematology

## 2021-01-23 VITALS — BP 116/70 | HR 85 | Temp 98.4°F | Ht 61.6 in | Wt 149.8 lb

## 2021-01-23 DIAGNOSIS — C8311 Mantle cell lymphoma, lymph nodes of head, face, and neck: Secondary | ICD-10-CM

## 2021-01-23 DIAGNOSIS — R591 Generalized enlarged lymph nodes: Secondary | ICD-10-CM | POA: Diagnosis not present

## 2021-01-23 DIAGNOSIS — R7309 Other abnormal glucose: Secondary | ICD-10-CM | POA: Diagnosis not present

## 2021-01-23 DIAGNOSIS — Z6827 Body mass index (BMI) 27.0-27.9, adult: Secondary | ICD-10-CM | POA: Diagnosis not present

## 2021-01-23 DIAGNOSIS — I10 Essential (primary) hypertension: Secondary | ICD-10-CM | POA: Diagnosis not present

## 2021-01-23 NOTE — Progress Notes (Signed)
I,Yamilka Roman [employer] as a Education administrator for [employer], FNP.,have documented all relevant documentation on the behalf of [name], FNP,as directed by  [name], FNP while in the presence of [name], [place].   This visit occurred during the SARS-CoV-2 public health emergency.  Safety protocols were in place, including screening questions prior to the visit, additional usage of staff PPE, and extensive cleaning of exam room while observing appropriate contact time as indicated for disinfecting solutions.  Subjective:     Patient ID: Deanna [name] , female    DOB: 07-18-1945 , 75 y.o.   MRN: [id number]   Chief Complaint  Patient presents with   Hypertension   abnormal glucose   ER F/U    Patient stated she noticed she had 5 lumps in her neck so she went to the ER and also had a biopsy done.     HPI  Patient is here for abnormal glucose and bp f/u. She also reports that she had recently found 5 lumps on the side of her neck so she went to the ER and she also reports getting a biopsy done.  She has an appt next Wednesday with Hematology.  She had a full work up with GI in 2020, she was diagnosed with Pancreatitis. Both her father and sister died of leukemia.   Wt Readings from Last 3 Encounters: 01/23/21 : 149 lb 12.8 oz (67.9 kg) 01/15/21 : 153 lb 4.8 oz (69.5 kg) 01/14/21 : 150 lb 12.7 oz (68.4 kg)    Hypertension This is a chronic problem. The current episode started more than 1 year ago. The problem is unchanged. The problem is controlled. Pertinent negatives include no chest pain or palpitations. There are no associated agents to hypertension. Risk factors for coronary artery disease include sedentary lifestyle. Past treatments include calcium channel blockers. There are no compliance problems.  There is no history of chronic renal disease.    Past Medical History:  Diagnosis Date   Abnormal glucose 03/06/2020   Age-related osteoporosis without current pathological  fracture 11/22/2015   Overview:  Femoral neck  Formatting of this note might be different from the original. 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 Added automatically from request for surgery [id number]   Chronic pain syndrome 04/01/2015   Colon polyps    Essential hypertension 07/20/2000   Overview:  HBP   Female cystocele 02/27/2009   GAD (generalized anxiety disorder) 08/19/2015   GERD (gastroesophageal reflux disease)    History of Bell's palsy 03/21/2015   Hx of adenomatous colonic polyps 12/20/2008   Overview:  Colonoscopy 07/2003 - [facility] - polyps removed - recommended repeat in 3 years Colonoscopy 09/2011 - Dr. Marian Sorrow - hyperplastic polyp(s) were removed - repeat in 5 years   Hypertension    Incomplete uterovaginal prolapse 03/04/2004   Insomnia    Left bundle-branch block 05/20/2006   Lumbar radiculopathy 01/09/2016   Formatting of this note might be different from the original. Added automatically from request for surgery [id number]   LVH (left ventricular 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 05/23/2015   Non-rheumatic mitral regurgitation 03/21/2015   Osteopenia determined by x-ray 11/22/2015  Formatting of this note might be different from the original. Femoral neck   Other intervertebral disc degeneration, lumbar region 01/29/2015   Primary osteoarthritis, right hand 02/28/2002   Overview:  OA HANDS, ESP THUMBS   Radial tunnel syndrome, right 11/04/2016   Radiculopathy, lumbar region 11/22/2014   Overview:  Added automatically from request for surgery [id number]   Rhinitis, allergic 02/24/2002   Overview:  ALLERGY SYMPTOMS - states was seen by allergist in past and informed allergic  to dust   Sacroiliitis ([place]) 08/02/2019   Screening for colon cancer 04/17/2005   Formatting of this note might be different from the original. 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 TRIGGER THUMBS - started spring 2003. Injected 02/28/02     Family History  Problem Relation Age of Onset   Arthritis Mother    Diabetes Mother    Pancreatic cancer Mother 80   Early death Father    Leukemia Father    Leukemia Sister    Early death Sister    Diabetes Sister      Current Outpatient Medications:    amLODipine (NORVASC) 10 MG tablet, Take 1 tablet (10 mg total) by mouth daily., Disp: 90 tablet, Rfl: 1   Ascorbic Acid (VITAMIN C) 1000 MG tablet, Take 2,000 mg by mouth daily., Disp: , Rfl:    Calcium-Magnesium (CAL-MAG PO), Take 2,000 mg by mouth at bedtime. 1000 mg, Disp: , Rfl:    Carboxymethylcellul-Glycerin (CLEAR EYES FOR DRY EYES OP), Place 1 drop into both eyes daily., Disp: , Rfl:    Cholecalciferol (VITAMIN D PO), Take 2,000 Units by mouth daily., Disp: , Rfl:    dexlansoprazole (DEXILANT) 60 MG capsule, Take 1 capsule (60 mg total) by mouth daily., Disp: 30 capsule, Rfl: 5   fexofenadine (ALLEGRA ALLERGY) 180 MG tablet, Take 1 tablet (180 mg total) by mouth daily., Disp: 30 tablet, Rfl: 2   hydrochlorothiazide (HYDRODIURIL) 25 MG tablet, Take 1 tablet (25 mg total) by mouth 2 (two) times daily., Disp: 180 tablet, Rfl: 3   hydrocortisone (ANUSOL-HC) 25 MG suppository, Place 1 suppository (25 mg total) rectally 2 (two) times daily as needed for hemorrhoids or anal itching., Disp: 12 suppository, Rfl: 3   losartan (COZAAR) 50 MG  tablet, Take 1 tablet (50 mg total) by mouth daily., Disp: 90 tablet, Rfl: 1   Magnesium Salicylate 027 MG TABS, Take 325 mg by mouth daily., Disp: , Rfl:    mometasone (NASONEX) 50 MCG/ACT nasal spray, Place 2 sprays into the nose daily., Disp: 1 each, Rfl: 2   OVER THE COUNTER MEDICATION, Take 2,000 mg by mouth daily. Pau-D'arco 1000 mg each, Disp: , Rfl:    polyethylene glycol (MIRALAX / GLYCOLAX) 17 g packet, Take 17 g by mouth daily as needed for mild constipation., Disp: 14 each, Rfl: 0   Probiotic Product (PROBIOTIC DAILY PO), Take 1 tablet by mouth daily., Disp: , Rfl:    Vitamin A 2400 MCG (8000 UT) CAPS, Take 16,000 Units by mouth daily. 8000 units, Disp: , Rfl:    traMADol (ULTRAM) 50 MG tablet, SMARTSIG:1-2 Tablet(s) By Mouth 2-3 Times Daily PRN, Disp: , Rfl:  Allergies  Allergen Reactions   Gluten Meal Diarrhea and Other (See Comments)    Severe diarrhea per member    Codeine     Chest pains, acid reflux    Dust Mite Extract     Upper Respiratory issues    Gabapentin     Eyes went numb and blurry   Terbinafine Hcl Rash     Review of Systems  Constitutional: Negative.   Respiratory: Negative.    Cardiovascular: Negative.  Negative for chest pain, palpitations and leg swelling.  Neurological: Negative.   Psychiatric/Behavioral: Negative.      Today's Vitals   01/23/21 1053  BP: 116/70  Pulse: 85  Temp: 98.4 F (36.9 C)  Weight: 149 lb 12.8 oz (67.9 kg)  Height: 5' 1.6" (1.565 m)  PainSc: 0-No pain   Body mass index is 27.76 kg/m.   Objective:  Physical Exam Vitals reviewed.  Constitutional:      General: She is not in acute distress.    Appearance: Normal appearance.  Cardiovascular:     Rate and Rhythm: Normal rate and regular rhythm.     Pulses: Normal pulses.     Heart sounds: Normal heart sounds. No murmur heard. Pulmonary:     Effort: Pulmonary effort is normal. No respiratory distress.     Breath sounds: Normal breath sounds. No wheezing.   Skin:    Capillary Refill: Capillary refill takes less than 2 seconds.  Neurological:     General: No focal deficit present.     Mental Status: She is alert and oriented to [place], place, and time.     Cranial Nerves: No cranial nerve deficit.  Psychiatric:        Mood and Affect: Mood normal.        Behavior: Behavior normal.        Thought Content: Thought content normal.        Judgment: Judgment normal.        Assessment And Plan:     1. Abnormal glucose Comments: Will check HgbA1c No current medications - Hemoglobin A1c  2. Essential hypertension Comments: Well controlled Continue current medications  3. Lymphadenopathy Comments: She is being seen by Oncology to evaulate further, discussed her concerns about this new finding.   4. BMI 27.0-27.9,adult  She is encouraged to strive for BMI less than 25 to decrease cardiac risk. Advised to aim for at least 150 minutes of exercise per week.   Patient was given opportunity to ask questions. Patient verbalized understanding of the plan and was able to repeat key elements of the plan. All questions were answered to their satisfaction.  [name], FNP   I, [name], FNP, have reviewed all documentation for this visit. The documentation on 01/23/21 for the exam, diagnosis, procedures, and orders are all accurate and complete.   IF YOU HAVE BEEN REFERRED TO A SPECIALIST, IT MAY TAKE 1-2 WEEKS TO SCHEDULE/PROCESS THE REFERRAL. IF YOU HAVE NOT HEARD FROM US/SPECIALIST IN TWO WEEKS, PLEASE GIVE [place] A CALL AT [phone].   THE PATIENT IS ENCOURAGED TO PRACTICE SOCIAL DISTANCING DUE TO THE COVID-19 PANDEMIC.

## 2021-01-23 NOTE — Telephone Encounter (Signed)
Scheduled appt per 8/25 sch msg. Pt aware.

## 2021-01-23 NOTE — Patient Instructions (Signed)

## 2021-01-24 ENCOUNTER — Telehealth: Payer: Self-pay

## 2021-01-24 LAB — HEMOGLOBIN A1C
Est. average glucose Bld gHb Est-mCnc: 128 mg/dL
Hgb A1c MFr Bld: 6.1 % — ABNORMAL HIGH (ref 4.8–5.6)

## 2021-01-24 NOTE — Telephone Encounter (Signed)
This nurse spoke with this patient and informed patient per Dr. Burr Medico  that her neck lymph node biopsy showed lymphoma, and we have ordered a PET scan for her.  She has also recommended her to see our [place] specialist Dr. Lorenso Courier on 8/31. Patient states that she is in agreement with seeing Dr. Patrecia Pour she states that her PET is scheduled for 02/07/21.  Patient request to know if she will have to wait for PET to begin treatment.  This nurse advised that Dr. Lorenso Courier should address treatment options in the office visit.  Patient then states can she have PET scan moved to a sooner date.  This nurse provided the number to central scheduling and she can reach out and have the date moved to a sooner time and date if one is available.  Patient has no further questions or concerns at this time.

## 2021-01-29 ENCOUNTER — Inpatient Hospital Stay (HOSPITAL_BASED_OUTPATIENT_CLINIC_OR_DEPARTMENT_OTHER): Payer: MEDICARE | Admitting: Hematology and Oncology

## 2021-01-29 ENCOUNTER — Inpatient Hospital Stay: Payer: MEDICARE

## 2021-01-29 ENCOUNTER — Other Ambulatory Visit: Payer: Self-pay | Admitting: Hematology and Oncology

## 2021-01-29 ENCOUNTER — Other Ambulatory Visit: Payer: Self-pay

## 2021-01-29 VITALS — BP 137/66 | HR 81 | Temp 98.2°F | Resp 18 | Wt 149.4 lb

## 2021-01-29 DIAGNOSIS — R590 Localized enlarged lymph nodes: Secondary | ICD-10-CM | POA: Diagnosis not present

## 2021-01-29 DIAGNOSIS — C8581 Other specified types of non-Hodgkin lymphoma, lymph nodes of head, face, and neck: Secondary | ICD-10-CM

## 2021-01-29 DIAGNOSIS — C8580 Other specified types of non-Hodgkin lymphoma, unspecified site: Secondary | ICD-10-CM

## 2021-01-29 LAB — CBC WITH DIFFERENTIAL (CANCER CENTER ONLY)
Abs Immature Granulocytes: 0.03 10*3/uL (ref 0.00–0.07)
Basophils Absolute: 0.1 10*3/uL (ref 0.0–0.1)
Basophils Relative: 1 %
Eosinophils Absolute: 0.2 10*3/uL (ref 0.0–0.5)
Eosinophils Relative: 3 %
HCT: 41.1 % (ref 36.0–46.0)
Hemoglobin: 13.7 g/dL (ref 12.0–15.0)
Immature Granulocytes: 1 %
Lymphocytes Relative: 22 %
Lymphs Abs: 1.4 10*3/uL (ref 0.7–4.0)
MCH: 26.7 pg (ref 26.0–34.0)
MCHC: 33.3 g/dL (ref 30.0–36.0)
MCV: 80.1 fL (ref 80.0–100.0)
Monocytes Absolute: 0.7 10*3/uL (ref 0.1–1.0)
Monocytes Relative: 12 %
Neutro Abs: 3.7 10*3/uL (ref 1.7–7.7)
Neutrophils Relative %: 61 %
Platelet Count: 248 10*3/uL (ref 150–400)
RBC: 5.13 MIL/uL — ABNORMAL HIGH (ref 3.87–5.11)
RDW: 14.9 % (ref 11.5–15.5)
WBC Count: 6.1 10*3/uL (ref 4.0–10.5)
nRBC: 0 % (ref 0.0–0.2)

## 2021-01-29 LAB — HEPATITIS B SURFACE ANTIBODY,QUALITATIVE: Hep B S Ab: NONREACTIVE

## 2021-01-29 LAB — CMP (CANCER CENTER ONLY)
ALT: 18 U/L (ref 0–44)
AST: 20 U/L (ref 15–41)
Albumin: 4.5 g/dL (ref 3.5–5.0)
Alkaline Phosphatase: 82 U/L (ref 38–126)
Anion gap: 11 (ref 5–15)
BUN: 12 mg/dL (ref 8–23)
CO2: 27 mmol/L (ref 22–32)
Calcium: 10.6 mg/dL — ABNORMAL HIGH (ref 8.9–10.3)
Chloride: 103 mmol/L (ref 98–111)
Creatinine: 0.76 mg/dL (ref 0.44–1.00)
GFR, Estimated: >60 mL/min (ref >60)
Glucose, Bld: 90 mg/dL (ref 70–99)
Potassium: 3.7 mmol/L (ref 3.5–5.1)
Sodium: 141 mmol/L (ref 135–145)
Total Bilirubin: 0.7 mg/dL (ref 0.3–1.2)
Total Protein: 7.8 g/dL (ref 6.5–8.1)

## 2021-01-29 LAB — HIV ANTIBODY (ROUTINE TESTING W REFLEX): HIV Screen 4th Generation wRfx: NONREACTIVE

## 2021-01-29 LAB — LACTATE DEHYDROGENASE: LDH: 220 U/L — ABNORMAL HIGH (ref 98–192)

## 2021-01-29 LAB — HEPATITIS B CORE ANTIBODY, TOTAL: Hep B Core Total Ab: NONREACTIVE

## 2021-01-29 LAB — HEPATITIS B SURFACE ANTIGEN: Hepatitis B Surface Ag: NONREACTIVE

## 2021-01-29 LAB — HEPATITIS C ANTIBODY: HCV Ab: NONREACTIVE

## 2021-01-29 NOTE — Progress Notes (Signed)
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 Lorenso Courier   Interval History:  Deanna [name] 75 y.o. female with medical history significant for marginal zone lymphoma who presents for a follow up visit. The patient's last visit was on 01/15/2021 while inpatient with Dr.Feng. In the interim since the last visit her pathology has returned with marginal zone lymphoma.  On exam today Deanna [name] is accompanied by her daughter. Deanna [name] notes that on Saturday, 01/11/2021 the patient noted a "knot in her neck".  She notes that subsequently she felt 5-6 areas in her neck that were firm and rubbery.  She notes that she has not been experiencing any fevers, chills, sweats, nausea, vomiting or diarrhea.  She denies any weight loss.  She also notes that these lymph nodes are not painful.  On further discussion she notes that her family history is remarkable for leukemia in her father and sister.  Her mother had pancreatic cancer.  She has 2 healthy children and 3 healthy grandchildren.  She notes that she was a smoker but quit in October 1999.  She does not currently drink any alcohol.  She previously worked as an Web designer.  She does endorse having occasional headaches but otherwise has had no symptoms out of the ordinary.  A full 10 point ROS is listed below.  MEDICAL HISTORY:  Past Medical History:  Diagnosis Date   Abnormal glucose 03/06/2020   Age-related osteoporosis without current pathological fracture 11/22/2015   Overview:  Femoral neck  Formatting of  this note might be different from the original. 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 Added automatically from request for surgery [id number]   Chronic pain syndrome 04/01/2015   Colon polyps    Essential hypertension 07/20/2000   Overview:  HBP   Female cystocele 02/27/2009   GAD (generalized anxiety disorder) 08/19/2015   GERD (gastroesophageal reflux disease)    History of Bell's palsy 03/21/2015   Hx of adenomatous colonic polyps 12/20/2008   Overview:  Colonoscopy 07/2003 - [facility] - polyps removed - recommended repeat in 3 years Colonoscopy 09/2011 - Dr. Marian Sorrow - hyperplastic polyp(s) were removed - repeat in 5 years   Hypertension    Incomplete uterovaginal prolapse 03/04/2004   Insomnia    Left bundle-branch block 05/20/2006   Lumbar radiculopathy 01/09/2016   Formatting of this note might be different from the original. Added automatically from request for surgery [id number]   LVH (left ventricular 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 05/23/2015   Non-rheumatic mitral regurgitation 03/21/2015   Osteopenia determined by x-ray 11/22/2015   Formatting of this note might be different from the original. Femoral neck   Other intervertebral disc degeneration, lumbar region 01/29/2015   Primary osteoarthritis, right hand 02/28/2002   Overview:  OA HANDS, ESP THUMBS  Radial tunnel syndrome, right 11/04/2016   Radiculopathy, lumbar region 11/22/2014   Overview:  Added automatically from request for surgery [id number]   Rhinitis, allergic 02/24/2002   Overview:  ALLERGY SYMPTOMS - states was seen by allergist in past and informed allergic to dust   Sacroiliitis ([place]) 08/02/2019   Screening for  colon cancer 04/17/2005   Formatting of this note might be different from the original. 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 TRIGGER THUMBS - started spring 2003. Injected 02/28/02    SURGICAL HISTORY: Past Surgical History:  Procedure Laterality Date   ABDOMINAL HYSTERECTOMY  2010   [place]   SPINAL FUSION  01/2016   TONSILLECTOMY AND ADENOIDECTOMY  1956   TOTAL HIP ARTHROPLASTY Left 09/17/2020   Procedure: TOTAL HIP ARTHROPLASTY ANTERIOR APPROACH;  Surgeon: Paralee Cancel, MD;  Location: [facility];  Service: Orthopedics;  Laterality: Left;  70 mins    SOCIAL HISTORY: Social History   Socioeconomic History   Marital status: Divorced    Spouse name: Not on file   Number of children: 2   Years of education: 2 years college   Highest education level: Not on file  Occupational History   Occupation: Retired  Tobacco Use   Smoking status: Former    Packs/day: 0.50    Years: 30.00    Pack years: 15.00    Types: Cigarettes    Quit date: 12/31/1997    Years since quitting: 23.1   Smokeless tobacco: Never   Tobacco comments:    Quit in 1999  Vaping Use   Vaping Use: Never used  Substance and Sexual Activity   Alcohol use: Not Currently   Drug use: No   Sexual activity: Not Currently    Birth control/protection: Post-menopausal  Other Topics Concern   Not on file  Social History Narrative   Lives alone.   Right-handed.   One cup caffeine per day.   Social Determinants of Health   Financial Resource Strain: Low Risk    Difficulty of Paying Living Expenses: Not hard at all  Food  Insecurity: No Food Insecurity   Worried About Charity fundraiser in the Last Year: Never true   [place] in the Last Year: Never true  Transportation Needs: No Transportation Needs   Lack of Transportation (Medical): No   Lack of Transportation (Non-Medical): No  Physical Activity: Inactive   Days of Exercise per Week: 0 days   Minutes of Exercise per Session: 0 min  Stress: Not on file  Social Connections: Not on file  Intimate Partner Violence: Not on file    FAMILY HISTORY: Family History  Problem Relation Age of Onset   Arthritis Mother    Diabetes Mother    Pancreatic cancer Mother 53   Early death Father    Leukemia Father    Leukemia Sister    Early death Sister    Diabetes Sister     ALLERGIES:  is allergic to gluten meal, codeine, dust mite extract, gabapentin, and terbinafine hcl.  MEDICATIONS:  Current Outpatient Medications  Medication Sig Dispense Refill   amLODipine (  NORVASC) 10 MG tablet Take 1 tablet (10 mg total) by mouth daily. 90 tablet 1   Ascorbic Acid (VITAMIN C) 1000 MG tablet Take 2,000 mg by mouth daily.     Calcium-Magnesium (CAL-MAG PO) Take 2,000 mg by mouth at bedtime. 1000 mg     Carboxymethylcellul-Glycerin (CLEAR EYES FOR DRY EYES OP) Place 1 drop into both eyes daily.     Cholecalciferol (VITAMIN D PO) Take 2,000 Units by mouth daily.     dexlansoprazole (DEXILANT) 60 MG capsule Take 1 capsule (60 mg total) by mouth daily. 30 capsule 5   fexofenadine (ALLEGRA ALLERGY) 180 MG tablet Take 1 tablet (180 mg total) by mouth daily. 30 tablet 2   hydrochlorothiazide (HYDRODIURIL) 25 MG tablet Take 1 tablet (25 mg total) by mouth 2 (two) times daily. 180 tablet 3   hydrocortisone (ANUSOL-HC) 25 MG suppository Place 1 suppository (25 mg total) rectally 2 (two) times daily as needed for hemorrhoids or anal itching. 12 suppository 3   losartan (COZAAR) 50 MG tablet Take 1 tablet (50 mg total) by mouth daily. 90 tablet 1   Magnesium Salicylate  914 MG TABS Take 325 mg by mouth daily.     mometasone (NASONEX) 50 MCG/ACT nasal spray Place 2 sprays into the nose daily. 1 each 2   OVER THE COUNTER MEDICATION Take 2,000 mg by mouth daily. Pau-D'arco 1000 mg each     polyethylene glycol (MIRALAX / GLYCOLAX) 17 g packet Take 17 g by mouth daily as needed for mild constipation. 14 each 0   Probiotic Product (PROBIOTIC DAILY PO) Take 1 tablet by mouth daily.     traMADol (ULTRAM) 50 MG tablet SMARTSIG:1-2 Tablet(s) By Mouth 2-3 Times Daily PRN     Vitamin A 2400 MCG (8000 UT) CAPS Take 16,000 Units by mouth daily. 8000 units     No current facility-administered medications for this visit.    REVIEW OF SYSTEMS:   Constitutional: ( - ) fevers, ( - )  chills , ( - ) night sweats Eyes: ( - ) blurriness of vision, ( - ) double vision, ( - ) watery eyes Ears, nose, mouth, throat, and face: ( - ) mucositis, ( - ) sore throat Respiratory: ( - ) cough, ( - ) dyspnea, ( - ) wheezes Cardiovascular: ( - ) palpitation, ( - ) chest discomfort, ( - ) lower extremity swelling Gastrointestinal:  ( - ) nausea, ( - ) heartburn, ( - ) change in bowel habits Skin: ( - ) abnormal skin rashes Lymphatics: ( - ) new lymphadenopathy, ( - ) easy bruising Neurological: ( - ) numbness, ( - ) tingling, ( - ) new weaknesses Behavioral/Psych: ( - ) mood change, ( - ) new changes  All other systems were reviewed with the patient and are negative.  PHYSICAL EXAMINATION: ECOG PERFORMANCE STATUS: 1 - Symptomatic but completely ambulatory  Vitals:   01/29/21 1236  BP: 137/66  Pulse: 81  Resp: 18  Temp: 98.2 F (36.8 C)  SpO2: 95%   Filed Weights   01/29/21 1236  Weight: 149 lb 6.4 oz (67.8 kg)    GENERAL: Well-appearing elderly African-American female, alert, no distress and comfortable SKIN: skin color, texture, turgor are normal, no rashes or significant lesions EYES: conjunctiva are pink and non-injected, sclera clear NECK: supple, non-tender LYMPH:  Enlarged lymph nodes in the left cervical chain.  Otherwise no palpable lymphadenopathy in the cervical, axillary or inguinal lymph nodes LUNGS: clear to auscultation and percussion with  normal breathing effort HEART: regular rate & rhythm and no murmurs and no lower extremity edema Musculoskeletal: no cyanosis of digits and no clubbing  PSYCH: alert & oriented x 3, fluent speech NEURO: no focal motor/sensory deficits  LABORATORY DATA:  I have reviewed the data as listed CBC Latest Ref Rng & Units 01/29/2021 01/14/2021 10/07/2020  WBC 4.0 - 10.5 K/uL 6.1 7.4 5.4  Hemoglobin 12.0 - 15.0 g/dL 13.7 13.3 11.5  Hematocrit 36.0 - 46.0 % 41.1 40.5 34.0  Platelets 150 - 400 K/uL 248 261 368    CMP Latest Ref Rng & Units 01/29/2021 01/14/2021 09/18/2020  Glucose 70 - 99 mg/dL 90 104(H) 138(H)  BUN 8 - 23 mg/dL 12 15 15   Creatinine 0.44 - 1.00 mg/dL 0.76 0.68 0.46  Sodium 135 - 145 mmol/L 141 139 137  Potassium 3.5 - 5.1 mmol/L 3.7 3.9 4.1  Chloride 98 - 111 mmol/L 103 102 104  CO2 22 - 32 mmol/L 27 28 26   Calcium 8.9 - 10.3 mg/dL 10.6(H) 10.4(H) 8.9  Total Protein 6.5 - 8.1 g/dL 7.8 - -  Total Bilirubin 0.3 - 1.2 mg/dL 0.7 - -  Alkaline Phos 38 - 126 U/L 82 - -  AST 15 - 41 U/L 20 - -  ALT 0 - 44 U/L 18 - -     RADIOGRAPHIC STUDIES:  DG Chest 2 View  Result Date: 01/14/2021 CLINICAL DATA:  Lymphadenopathy.  Patient reports lump on her neck. EXAM: CHEST - 2 VIEW COMPARISON:  None. FINDINGS: The heart is normal in size. Aortic atherosclerosis. Equivocal hilar prominence. No paratracheal thickening. Mild hyperinflation. Minor subsegmental atelectasis at the right lung base. No pleural fluid or pneumothorax. No pulmonary mass. No acute osseous abnormalities are seen. IMPRESSION: 1. Equivocal hilar prominence which could be related to adenopathy given provided history. 2. Mild hyperinflation with subsegmental atelectasis at the right lung base. 3.  Aortic Atherosclerosis (ICD10-I70.0). Electronically  Signed   By: Keith Rake M.D.   On: 01/14/2021 22:32   CT Soft Tissue Neck W Contrast  Result Date: 01/15/2021 CLINICAL DATA:  Cervical lymphadenopathy EXAM: CT NECK WITH CONTRAST TECHNIQUE: Multidetector CT imaging of the neck was performed using the standard protocol following the bolus administration of intravenous contrast. CONTRAST:  60m OMNIPAQUE IOHEXOL 350 MG/ML SOLN COMPARISON:  None. FINDINGS: PHARYNX AND LARYNX: The nasopharynx, oropharynx and larynx are normal. Visible portions of the oral cavity, tongue base and floor of mouth are normal. Normal epiglottis, vallecula and pyriform sinuses. The larynx is normal. No retropharyngeal abscess, effusion or lymphadenopathy. SALIVARY GLANDS: Normal parotid, submandibular and sublingual glands. THYROID: Normal. LYMPH NODES: Numerous enlarged left cervical lymph nodes. The largest node is level three and measures 17 mm. Level 5A node measures 15 mm. There is a left level 1B node measuring 13 mm. Additionally, there are numerous subcentimeter anterior and posterior chain nodes. On the right, there are multiple subcentimeter nodes within posterior and anterior cervical chains. VASCULAR: Major cervical vessels are patent. LIMITED INTRACRANIAL: Normal. VISUALIZED ORBITS: Normal. MASTOIDS AND VISUALIZED PARANASAL SINUSES: No fluid levels or advanced mucosal thickening. No mastoid effusion. SKELETON: No bony spinal canal stenosis. No lytic or blastic lesions. UPPER CHEST: Clear. OTHER: None. IMPRESSION: Left-greater-than-right cervical lymphadenopathy, including multiple markedly enlarged left cervical nodes, most concerning for lymphoma. Electronically Signed   By: KUlyses JarredM.D.   On: 01/15/2021 00:15   CT Chest W Contrast  Result Date: 01/15/2021 CLINICAL DATA:  Patient reports a lump on her neck since Saturday.  EXAM: CT CHEST WITH CONTRAST TECHNIQUE: Multidetector CT imaging of the chest was performed during intravenous contrast administration.  CONTRAST:  70m OMNIPAQUE IOHEXOL 350 MG/ML SOLN COMPARISON:  None. FINDINGS: Cardiovascular: There is moderate to marked severity calcification of the aortic arch and descending thoracic aorta. Normal heart size. No pericardial effusion. Mediastinum/Nodes: There is mild bilateral axillary lymphadenopathy (the largest axillary lymph node is seen on the right and measures approximately 1.3 cm). Enlarged lateral supraclavicular and lower jugular lymph nodes are seen on the left. The largest measures approximately 1.9 cm (axial CT image 9, CT series 2). Focal narrowing of the subglottic trachea is seen. Thyroid gland and esophagus demonstrate no significant findings. Lungs/Pleura: Lungs are clear. No pleural effusion or pneumothorax. Upper Abdomen: There is diffuse fatty infiltration of the liver parenchyma. Musculoskeletal: Degenerative changes seen throughout the thoracic spine. IMPRESSION: 1. Left-sided lateral supraclavicular and lower jugular lymphadenopathy. While this may be reactive in nature, further evaluation is recommended to exclude the presence of an underlying neoplasm. 2. Focal narrowing of the subglottic trachea without visualization of an associated abscess or mass lesion. Further evaluation with contrast enhanced neck CT is recommended. Electronically Signed   By: TVirgina NorfolkM.D.   On: 01/15/2021 00:29   [place] BIOPSY (LYMPH NODES)  Result Date: 01/20/2021 INDICATION: 75year old female with a history left cervical adenopathy referred for biopsy EXAM: ULTRASOUND-GUIDED BIOPSY LEFT CERVICAL ADENOPATHY MEDICATIONS: None. ANESTHESIA/SEDATION: Moderate (conscious) sedation was employed during this procedure. A total of Versed 2.0 mg and Fentanyl 100 mcg was administered intravenously. Moderate Sedation Time: 10 minutes. The patient's level of consciousness and vital signs were monitored continuously by radiology nursing throughout the procedure under my direct supervision. FLUOROSCOPY TIME:   Ultrasound COMPLICATIONS: None PROCEDURE: Informed written consent was obtained from the patient after a thorough discussion of the procedural risks, benefits and alternatives. All questions were addressed. Maximal Sterile Barrier Technique was utilized including caps, mask, sterile gowns, sterile gloves, sterile drape, hand hygiene and skin antiseptic. A timeout was performed prior to the initiation of the procedure. Ultrasound survey was performed with images stored and sent to PACs. The left neck was prepped with chlorhexidine in a sterile fashion, and a sterile drape was applied covering the operative field. A sterile gown and sterile gloves were used for the procedure. Local anesthesia was provided with 1% Lidocaine. Ultrasound guidance was used to infiltrate the region with 1% lidocaine for local anesthesia. Using ultrasound guidance, 4 separate 18 gauge core needle biopsy were then acquired of the left cervical pathologic adenopathy using ultrasound guidance. Images were stored. Final image was stored after biopsy. Patient tolerated the procedure well and remained hemodynamically stable throughout. No complications were encountered and no significant blood loss was encounter IMPRESSION: Status post ultrasound-guided biopsy of left cervical pathologic adenopathy. Signed, JDulcy Fanny WDellia Nims RPVI [facility]Ad[facility] HospitalRadiology Electronically Signed   By: JCorrie MckusickD.O.   On: 01/20/2021 15:34    ASSESSMENT & PLAN Deanna BaylessPerson 75y.o. female with medical history significant for marginal zone lymphoma who presents for a follow up visit.  At this time we will need to complete the staging.  The patient has a PET CT scan ordered for 02/07/2021.  This will help u[place]ore[place]etermine the steps moving forward.  We will order baseline labs today to include CBC, CMP, LDH.  Additionally we will do viral work-up with HIV, hepatitis B, and hep C.  Once the results of PET scan are  available we will see the patient back in clinic to discuss steps moving forward.  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  This will provide [place] the necessary information for staging --If the patient is found to have early stage disease we could consider pursuing radiation therapy alone. --If there is advanced today's disease would recommend initial observation to determine if treatment is necessary. --We will order baseline labs today to include CBC, CMP, LDH --Viral work-up with HIV, hepatitis B, and hepatitis C. --Plan for return to clinic following the results of the PET CT scan.  No orders of the defined types were placed in this encounter.   All questions were answered. The patient knows to call the clinic with any problems, questions or concerns.  A total of more than 40 minutes were spent on this encounter with face-to-face time and non-face-to-face time, including preparing to see the patient, ordering tests and/or medications, counseling the patient and coordination of care as outlined above.   Ledell Peoples, MD Department of Hematology/Oncology [place] [facility] Phone: [phone] Pager: [phone] Email: [email or web address]  02/03/2021 7:23 PM

## 2021-02-04 ENCOUNTER — Telehealth: Payer: Self-pay | Admitting: Hematology and Oncology

## 2021-02-04 NOTE — Telephone Encounter (Signed)
Scheduled appt per 9/5 sch msg. Pt aware.

## 2021-02-05 ENCOUNTER — Encounter: Payer: Self-pay | Admitting: Nurse Practitioner

## 2021-02-07 ENCOUNTER — Ambulatory Visit (HOSPITAL_COMMUNITY)
Admission: RE | Admit: 2021-02-07 | Discharge: 2021-02-07 | Disposition: A | Discharge status: Home / Self Care | Payer: MEDICARE | Source: Ambulatory Visit | Attending: Hematology | Admitting: Hematology

## 2021-02-07 ENCOUNTER — Other Ambulatory Visit: Payer: Self-pay

## 2021-02-07 DIAGNOSIS — C8311 Mantle cell lymphoma, lymph nodes of head, face, and neck: Secondary | ICD-10-CM | POA: Insufficient documentation

## 2021-02-07 LAB — GLUCOSE, CAPILLARY: Glucose-Capillary: 103 mg/dL — ABNORMAL HIGH (ref 70–99)

## 2021-02-07 MED ORDER — FLUDEOXYGLUCOSE F - 18 (FDG) INJECTION
7.5000 | Freq: Once | INTRAVENOUS | Status: AC | PRN
  Administered 2021-02-07: 7.5 via INTRAVENOUS

## 2021-02-12 ENCOUNTER — Other Ambulatory Visit: Payer: Self-pay

## 2021-02-12 ENCOUNTER — Inpatient Hospital Stay: Payer: MEDICARE | Attending: Hematology | Admitting: Hematology and Oncology

## 2021-02-12 VITALS — BP 146/72 | HR 86 | Temp 98.7°F | Resp 18 | Wt 150.0 lb

## 2021-02-12 DIAGNOSIS — I10 Essential (primary) hypertension: Secondary | ICD-10-CM | POA: Diagnosis not present

## 2021-02-12 DIAGNOSIS — C8588 Other specified types of non-Hodgkin lymphoma, lymph nodes of multiple sites: Secondary | ICD-10-CM | POA: Insufficient documentation

## 2021-02-12 DIAGNOSIS — Z86010 Personal history of colonic polyps: Secondary | ICD-10-CM | POA: Diagnosis not present

## 2021-02-12 DIAGNOSIS — M810 Age-related osteoporosis without current pathological fracture: Secondary | ICD-10-CM | POA: Diagnosis not present

## 2021-02-12 DIAGNOSIS — I2510 Atherosclerotic heart disease of native coronary artery without angina pectoris: Secondary | ICD-10-CM | POA: Insufficient documentation

## 2021-02-12 DIAGNOSIS — I340 Nonrheumatic mitral (valve) insufficiency: Secondary | ICD-10-CM | POA: Insufficient documentation

## 2021-02-12 DIAGNOSIS — K760 Fatty (change of) liver, not elsewhere classified: Secondary | ICD-10-CM | POA: Diagnosis not present

## 2021-02-12 DIAGNOSIS — Z8719 Personal history of other diseases of the digestive system: Secondary | ICD-10-CM | POA: Diagnosis not present

## 2021-02-12 DIAGNOSIS — Z96642 Presence of left artificial hip joint: Secondary | ICD-10-CM | POA: Diagnosis not present

## 2021-02-12 DIAGNOSIS — K219 Gastro-esophageal reflux disease without esophagitis: Secondary | ICD-10-CM | POA: Insufficient documentation

## 2021-02-12 DIAGNOSIS — Z79899 Other long term (current) drug therapy: Secondary | ICD-10-CM | POA: Diagnosis not present

## 2021-02-12 DIAGNOSIS — I700 Atherosclerosis of aorta: Secondary | ICD-10-CM | POA: Insufficient documentation

## 2021-02-12 DIAGNOSIS — C8581 Other specified types of non-Hodgkin lymphoma, lymph nodes of head, face, and neck: Secondary | ICD-10-CM | POA: Diagnosis not present

## 2021-02-12 DIAGNOSIS — C8580 Other specified types of non-Hodgkin lymphoma, unspecified site: Secondary | ICD-10-CM | POA: Diagnosis not present

## 2021-02-12 DIAGNOSIS — G894 Chronic pain syndrome: Secondary | ICD-10-CM | POA: Diagnosis not present

## 2021-02-15 ENCOUNTER — Encounter (HOSPITAL_BASED_OUTPATIENT_CLINIC_OR_DEPARTMENT_OTHER): Payer: Self-pay

## 2021-02-15 ENCOUNTER — Emergency Department (HOSPITAL_BASED_OUTPATIENT_CLINIC_OR_DEPARTMENT_OTHER): Payer: MEDICARE | Admitting: Radiology

## 2021-02-15 ENCOUNTER — Other Ambulatory Visit: Payer: Self-pay

## 2021-02-15 ENCOUNTER — Emergency Department (HOSPITAL_BASED_OUTPATIENT_CLINIC_OR_DEPARTMENT_OTHER)
Admission: EM | Admit: 2021-02-15 | Discharge: 2021-02-16 | Disposition: A | Discharge status: Home / Self Care | Payer: MEDICARE | Attending: Emergency Medicine | Admitting: Emergency Medicine

## 2021-02-15 DIAGNOSIS — I10 Essential (primary) hypertension: Secondary | ICD-10-CM | POA: Insufficient documentation

## 2021-02-15 DIAGNOSIS — Y9301 Activity, walking, marching and hiking: Secondary | ICD-10-CM | POA: Diagnosis not present

## 2021-02-15 DIAGNOSIS — Z79899 Other long term (current) drug therapy: Secondary | ICD-10-CM | POA: Insufficient documentation

## 2021-02-15 DIAGNOSIS — X58XXXA Exposure to other specified factors, initial encounter: Secondary | ICD-10-CM | POA: Insufficient documentation

## 2021-02-15 DIAGNOSIS — S82425A Nondisplaced transverse fracture of shaft of left fibula, initial encounter for closed fracture: Secondary | ICD-10-CM | POA: Diagnosis not present

## 2021-02-15 DIAGNOSIS — Z87891 Personal history of nicotine dependence: Secondary | ICD-10-CM | POA: Diagnosis not present

## 2021-02-15 DIAGNOSIS — S8991XA Unspecified injury of right lower leg, initial encounter: Secondary | ICD-10-CM | POA: Diagnosis present

## 2021-02-15 DIAGNOSIS — Z96642 Presence of left artificial hip joint: Secondary | ICD-10-CM | POA: Insufficient documentation

## 2021-02-15 DIAGNOSIS — Y990 Civilian activity done for income or pay: Secondary | ICD-10-CM | POA: Insufficient documentation

## 2021-02-15 DIAGNOSIS — M79604 Pain in right leg: Secondary | ICD-10-CM

## 2021-02-15 DIAGNOSIS — S82424A Nondisplaced transverse fracture of shaft of right fibula, initial encounter for closed fracture: Secondary | ICD-10-CM

## 2021-02-15 MED ORDER — HYDROCODONE-ACETAMINOPHEN 5-325 MG PO TABS: 0.5000 | ORAL_TABLET | Freq: Three times a day (TID) | ORAL | 0 refills | Status: AC | PRN

## 2021-02-15 MED ORDER — ACETAMINOPHEN 500 MG PO TABS
1000.0000 mg | ORAL_TABLET | Freq: Once | ORAL | Status: AC
  Administered 2021-02-15: 1000 mg via ORAL
  Filled 2021-02-15: qty 2

## 2021-02-15 NOTE — ED Provider Notes (Signed)
[facility] EMERGENCY DEPT Provider Note  CSN: [id number] Arrival date & time: 02/15/21 2004  Chief Complaint(s) Leg Pain  HPI Deanna [name] is a 75 y.o. female   The history is provided by the patient.  Leg Pain Location:  Leg Time since incident:  1 day Injury: no   Leg location:  R lower leg Pain details:    Quality:  Aching   Radiates to:  Does not radiate   Severity:  Moderate   Onset quality:  Sudden   Timing:  Constant   Progression:  Unchanged Chronicity:  Recurrent (had stress fracture of Fib in Aug) Relieved by:  Acetaminophen (Tramadol) Worsened by:  Bearing weight and activity Associated symptoms: no decreased ROM, no fever, no itching, no neck pain, no numbness, no stiffness, no swelling and no tingling    Pain is the same pain she felt with the stress fracture..  Reports that she has been pain-free for several weeks.  Pain began last night while walking to her car while at work.  She denies any falls or trauma.     Past Medical History Past Medical History:  Diagnosis Date   Abnormal glucose 03/06/2020   Age-related osteoporosis without current pathological fracture 11/22/2015   Overview:  Femoral neck  Formatting of this note might be different from the original. 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 Added automatically from request for surgery [id number]   Chronic pain syndrome 04/01/2015   Colon polyps    Essential hypertension 07/20/2000   Overview:  HBP   Female cystocele 02/27/2009   GAD (generalized anxiety disorder) 08/19/2015   GERD (gastroesophageal reflux disease)    History of Bell's palsy 03/21/2015   Hx of adenomatous  colonic polyps 12/20/2008   Overview:  Colonoscopy 07/2003 - [facility] - polyps removed - recommended repeat in 3 years Colonoscopy 09/2011 - Dr. Marian Sorrow - hyperplastic polyp(s) were removed - repeat in 5 years   Hypertension    Incomplete uterovaginal prolapse 03/04/2004   Insomnia    Left bundle-branch block 05/20/2006   Lumbar radiculopathy 01/09/2016   Formatting of this note might be different from the original. Added automatically from request for surgery [phone]   LVH (left ventricular 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 05/23/2015   Non-rheumatic mitral regurgitation 03/21/2015   Osteopenia determined by x-ray 11/22/2015   Formatting of this note might be different from the original. Femoral neck   Other intervertebral disc degeneration, lumbar region 01/29/2015   Primary osteoarthritis, right hand 02/28/2002   Overview:  OA HANDS, ESP THUMBS   Radial tunnel syndrome, right 11/04/2016   Radiculopathy, lumbar region 11/22/2014   Overview:  Added automatically from request for surgery [id number]   Rhinitis, allergic 02/24/2002   Overview:  ALLERGY SYMPTOMS - states was seen by allergist in past and informed allergic to dust   Sacroiliitis ([place]) 08/02/2019   Screening for colon cancer 04/17/2005   Formatting of this note might be different from the original. 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 TRIGGER THUMBS - started spring 2003. 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 11/26/2015   Age-related osteoporosis without current pathological fracture 11/22/2015   Osteopenia determined by x-ray 11/22/2015   Allergic rhinitis due to pollen 08/19/2015   GAD (generalized anxiety disorder) 08/19/2015   LVH (left ventricular 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 05/23/2015   Chronic pain syndrome 04/01/2015   Non-rheumatic mitral regurgitation 03/21/2015   History of Bell's palsy 03/21/2015   Other intervertebral disc degeneration, lumbar region 01/29/2015   Spinal stenosis, lumbar region without neurogenic claudication 11/22/2014   Radiculopathy, lumbar region 11/22/2014   Cervical disc disorder 05/10/2009   Female cystocele 02/27/2009   Hx of adenomatous colonic polyps 12/20/2008   Celiac disease 07/12/2006   Left bundle-branch block 05/20/2006   Chronic cough 02/15/2006   Screening for colon cancer 04/17/2005   Unspecified asthma, uncomplicated 63/33/5456   Incomplete uterovaginal prolapse 03/04/2004   Primary osteoarthritis, right hand 02/28/2002   Carpal tunnel syndrome, bilateral 02/28/2002   Trigger thumb, unspecified thumb 02/28/2002   Rhinitis, allergic 02/24/2002   Essential hypertension 07/20/2000   Home Medication(s) Prior to Admission medications   Medication Sig Start Date End Date Taking? Authorizing Provider  HYDROcodone-acetaminophen (NORCO/VICODIN) 5-325 MG tablet Take 0.5-1 tablets by mouth every 8 (eight) hours as needed for up to 5 days for severe pain (That is not improved by your scheduled  acetaminophen regimen). Please do not exceed 4000 mg of acetaminophen (Tylenol) a 24-hour period. Please note that he may be prescribed additional medicine that contains acetaminophen. 02/15/21 02/20/21 Yes Epic Tribbett, Grayce Sessions, MD  amLODipine (NORVASC) 10 MG tablet Take 1 tablet (10 mg total) by mouth daily. 10/07/20   [name], FNP  Ascorbic Acid (VITAMIN C) 1000 MG tablet Take 2,000 mg by mouth daily.    [provider]  Calcium-Magnesium (CAL-MAG PO) Take 2,000 mg by mouth at bedtime. 1000 mg    [provider]  Carboxymethylcellul-Glycerin (CLEAR EYES FOR DRY EYES OP) Place 1 drop into both eyes daily.    [provider]  Cholecalciferol (VITAMIN D PO) Take 2,000 Units by mouth daily.    [provider]  dexlansoprazole (DEXILANT) 60 MG capsule Take 1 capsule (60 mg total) by mouth daily. 07/08/20   [name], FNP  fexofenadine [facility] ALLERGY) 180 MG tablet Take 1 tablet (180 mg total) by mouth daily. 10/31/20   [name], FNP  hydrochlorothiazide (HYDRODIURIL) 25 MG tablet Take 1 tablet (25 mg total) by mouth 2 (two) times daily. 10/07/20   [name], FNP  hydrocortisone (ANUSOL-HC) 25 MG suppository Place 1 suppository (25 mg total) rectally 2 (two) times daily as needed for hemorrhoids or anal itching. 07/08/20  [name], FNP  losartan (COZAAR) 50 MG tablet Take 1 tablet (50 mg total) by mouth daily. 10/07/20   [name], FNP  Magnesium Salicylate 621 MG TABS Take 325 mg by mouth daily.    [provider]  milk thistle 175 MG tablet Take 175 mg by mouth daily.    [provider]  mometasone (NASONEX) 50 MCG/ACT nasal spray Place 2 sprays into the nose daily. 10/31/20 10/31/21  [name], FNP  OVER THE COUNTER MEDICATION Take 2,000 mg by mouth daily. Pau-D'arco 1000 mg each    [provider]  polyethylene glycol (MIRALAX / GLYCOLAX) 17 g packet Take 17 g by mouth daily as needed for mild constipation. 09/18/20    Irving Copas, PA-C  Probiotic Product (PROBIOTIC DAILY PO) Take 1 tablet by mouth daily.    [provider]  traMADol (ULTRAM) 50 MG tablet SMARTSIG:1-2 Tablet(s) By Mouth 2-3 Times Daily PRN 01/09/21   [provider]  Vitamin A 2400 MCG (8000 UT) CAPS Take 16,000 Units by mouth daily. 8000 units    [provider]                                                                                                                                    Past Surgical History Past Surgical History:  Procedure Laterality Date   ABDOMINAL HYSTERECTOMY  2010   [place]   SPINAL FUSION  01/2016   TONSILLECTOMY AND ADENOIDECTOMY  1956   TOTAL HIP ARTHROPLASTY Left 09/17/2020   Procedure: TOTAL HIP ARTHROPLASTY ANTERIOR APPROACH;  Surgeon: Paralee Cancel, MD;  Location: [facility] ORS;  Service: Orthopedics;  Laterality: Left;  68 mins   Family History Family History  Problem Relation Age of Onset   Arthritis Mother    Diabetes Mother    Pancreatic cancer Mother 90   Early death Father    Leukemia Father    Leukemia Sister    Early death Sister    Diabetes Sister     Social History Social History   Tobacco Use   Smoking status: Former    Packs/day: 0.50    Years: 30.00    Pack years: 15.00    Types: Cigarettes    Quit date: 12/31/1997    Years since quitting: 23.1   Smokeless tobacco: Never   Tobacco comments:    Quit in 1999  Vaping Use   Vaping Use: Never used  Substance Use Topics   Alcohol use: Not Currently   Drug use: No   Allergies Gluten meal, Codeine, Dust mite extract, Gabapentin, and Terbinafine hcl  Review of Systems Review of Systems  Constitutional:  Negative for fever.  Musculoskeletal:  Negative for neck pain and stiffness.  Skin:  Negative for itching.  All other systems are reviewed and are negative for acute change except as noted in the HPI  Physical Exam Vital Signs  I have reviewed the triage vital  signs BP 136/67 (BP Location: Right Arm)   Pulse 82   Temp 98.3 F (36.8 C) (Oral)   Resp 20   Ht 5' 1"  (1.549 m)   Wt 68 kg   SpO2 98%   BMI 28.33 kg/m   Physical Exam Vitals reviewed.  Constitutional:      General: She is not in acute distress.    Appearance: She is well-developed. She is not diaphoretic.  HENT:     Head: Normocephalic and atraumatic.     Right Ear: External ear normal.     Left Ear: External ear normal.     Nose: Nose normal.  Eyes:     General: No scleral icterus.    Conjunctiva/sclera: Conjunctivae normal.  Neck:     Trachea: Phonation normal.  Cardiovascular:     Rate and Rhythm: Normal rate and regular rhythm.  Pulmonary:     Effort: Pulmonary effort is normal. No respiratory distress.     Breath sounds: No stridor.  Abdominal:     General: There is no distension.  Musculoskeletal:        General: Normal range of motion.     Cervical back: Normal range of motion.     Right lower leg: Tenderness and bony tenderness present. No swelling, deformity or lacerations. No edema.       Legs:  Neurological:     Mental Status: She is alert and oriented to [place], place, and time.  Psychiatric:        Behavior: Behavior normal.    ED Results and Treatments Labs (all labs ordered are listed, but only abnormal results are displayed) Labs Reviewed - No data to display                                                                                                                       EKG  EKG Interpretation  Date/Time:    Ventricular Rate:    PR Interval:    QRS Duration:   QT Interval:    QTC Calculation:   R Axis:     Text Interpretation:         Radiology DG Tibia/Fibula Right  Result Date: 02/15/2021 CLINICAL DATA:  Pain right mid posterior calf. History of right fibular fracture in July 2022 EXAM: RIGHT TIBIA AND FIBULA - 2 VIEW COMPARISON:  01/03/2021 FINDINGS: Callus formation around the midshaft right fibula consistent with healing  of previously seen fracture. Alignment is normal. Diffuse bone demineralization. No new acute fracture or dislocation suggested. Soft tissues are unremarkable. IMPRESSION: Healing nondisplaced fracture of the midshaft right fibula. Electronically Signed   By: Lucienne Capers M.D.   On: 02/15/2021 23:46    Pertinent labs & imaging results that were available during my care of the patient were reviewed by me and considered in my medical decision making (see MDM for details).  Medications Ordered in ED Medications  acetaminophen (TYLENOL) tablet 1,000 mg (1,000 mg Oral Given  02/15/21 2325)                                                                                                                                     Procedures Procedures  (including critical care time)  Medical Decision Making / ED Course I have reviewed the nursing notes for this encounter and the patient's prior records (if available in EHR or on provided paperwork).  Deanna [name] was evaluated in Emergency Department on 02/15/2021 for the symptoms described in the history of present illness. She was evaluated in the context of the global COVID-19 pandemic, which necessitated consideration that the patient might be at risk for infection with the SARS-CoV-2 virus that causes COVID-19. Institutional protocols and algorithms that pertain to the evaluation of patients at risk for COVID-19 are in a state of rapid change based on information released by regulatory bodies including the CDC and federal and state organizations. These policies and algorithms were followed during the patient's care in the ED.     Pain to the right lateral leg over the fibula in the same region of her previous stress fracture. She denies any recent fall or trauma. No lower extremity swelling or edema. Pulses intact. Low suspicion for arterial occlusion. Low suspicion for DVT. Plain film ordered to assess any changes in the known  fracture. Provided with Tylenol for discomfort.  Pertinent labs & imaging results that were available during my care of the patient were reviewed by me and considered in my medical decision making:  Plain film is notable for healing fracture of the fibula at midshaft. This is consistent with the location of patient's point tenderness. Will provide patient with a cam walker for additional support.  Recommend continued follow-up with orthopedic surgery as needed.  Final Clinical Impression(s) / ED Diagnoses Final diagnoses:  Acute leg pain, right  Closed nondisplaced transverse fracture of shaft of right fibula, initial encounter   The patient appears reasonably screened and/or stabilized for discharge and I doubt any other medical condition or other [facility] requiring further screening, evaluation, or treatment in the ED at this time prior to discharge. Safe for discharge with strict return precautions.  Disposition: Discharge  Condition: Good  I have discussed the results, Dx and Tx plan with the patient/family who expressed understanding and agree(s) with the plan. Discharge instructions discussed at length. The patient/family was given strict return precautions who verbalized understanding of the instructions. No further questions at time of discharge.    ED Discharge Orders          Ordered    HYDROcodone-acetaminophen (NORCO/VICODIN) 5-325 MG tablet  Every 8 hours PRN        02/15/21 [place] narcotic database reviewed and no active prescriptions noted.   Follow Up: [name], [address] [place] [address] [phone]  Call  as needed  Orthopedic Surgery  Call  to schedule an appointment for close follow up    This chart was dictated using voice recognition software.  Despite best efforts to proofread,  errors can occur which can change the documentation meaning.    Fatima Blank, MD 02/15/21  [phone]

## 2021-02-15 NOTE — Discharge Instructions (Addendum)
For pain control you may take at 1000 mg of Tylenol every 8 hours scheduled.  In addition you can take 0.5 to 1 tablet of Norco/Vicodin every 8 hours for pain not controlled with the scheduled Tylenol.

## 2021-02-15 NOTE — ED Triage Notes (Signed)
Pt reports right leg pain that started last night -  denies trauma - states she was dx with stress fx to right tibia last month  and has been on air splint since. + dorsal pedal pulse. Air splint was removed on triage.

## 2021-02-16 ENCOUNTER — Other Ambulatory Visit: Payer: Self-pay

## 2021-02-16 ENCOUNTER — Emergency Department (HOSPITAL_BASED_OUTPATIENT_CLINIC_OR_DEPARTMENT_OTHER)
Admission: EM | Admit: 2021-02-16 | Discharge: 2021-02-16 | Disposition: A | Discharge status: Home / Self Care | Payer: MEDICARE | Source: Home / Self Care | Attending: Emergency Medicine | Admitting: Emergency Medicine

## 2021-02-16 ENCOUNTER — Emergency Department (HOSPITAL_BASED_OUTPATIENT_CLINIC_OR_DEPARTMENT_OTHER): Payer: MEDICARE

## 2021-02-16 DIAGNOSIS — I10 Essential (primary) hypertension: Secondary | ICD-10-CM | POA: Insufficient documentation

## 2021-02-16 DIAGNOSIS — Z87891 Personal history of nicotine dependence: Secondary | ICD-10-CM | POA: Insufficient documentation

## 2021-02-16 DIAGNOSIS — Z79899 Other long term (current) drug therapy: Secondary | ICD-10-CM | POA: Insufficient documentation

## 2021-02-16 DIAGNOSIS — M79604 Pain in right leg: Secondary | ICD-10-CM

## 2021-02-16 DIAGNOSIS — S82425A Nondisplaced transverse fracture of shaft of left fibula, initial encounter for closed fracture: Secondary | ICD-10-CM | POA: Diagnosis not present

## 2021-02-16 DIAGNOSIS — Z96642 Presence of left artificial hip joint: Secondary | ICD-10-CM | POA: Insufficient documentation

## 2021-02-16 NOTE — Discharge Instructions (Signed)
Fortunately ultrasound today did not show any evidence of blood clot in your right leg.  Continue to use your cane, CAM Walker as well as Aircast for support.  Consider follow-up closely with your orthopedist specialist for further evaluation and management of your discomfort.

## 2021-02-16 NOTE — ED Provider Notes (Signed)
[facility] EMERG[facility] DEPT Provider Note   CSN: [id number] Arrival date & time: 02/16/21  1345     History Chief Complaint  Patient presents with   Leg Pain    Deanna [name] is a 75 y.o. female.  The history is provided by the patient and medical records. No language interpreter was used.  Leg Pain Associated symptoms: no fever    75 year old female significant history of chronic pain, prior right fibular stress fracture who presents complaining of right leg pain.  Patient mention she had a right fibula fracture 5 weeks ago.  She has been wearing an Aircast.  Her orthopedist is Dr. Caprice Beaver from [facility].  She reports she has been doing quite well and not using crutches anymore however within the past 2days she noticed increasing pain to her right leg.  Pain is sharp throbbing achy worse with movement.  She was seen in the ED yesterday for her complaint.  A repeat x-ray did not show any worsening symptoms.  She was told that her symptoms likely not related to a blood clot because she does not have any calf pain.  She went home however this morning she noticed increasing pain throughout her leg including her calf which concerns her.  She denies any new injury no fever and no numbness.  No prior history of PE or DVT  Past Medical History:  Diagnosis Date   Abnormal glucose 03/06/2020   Age-related osteoporosis without current pathological fracture 11/22/2015   Overview:  Femoral neck  Formatting of this note might be different from the original. 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 Added automatically from request for surgery [id number]   Chronic pain syndrome  04/01/2015   Colon polyps    Essential hypertension 07/20/2000   Overview:  HBP   Female cystocele 02/27/2009   GAD (generalized anxiety disorder) 08/19/2015   GERD (gastroesophageal reflux disease)    History of Bell's palsy 03/21/2015   Hx of adenomatous colonic polyps 12/20/2008   Overview:  Colonoscopy 07/2003 - [facility] - polyps removed - recommended repeat in 3 years Colonoscopy 09/2011 - Dr. Marian Sorrow - hyperplastic polyp(s) were removed - repeat in 5 years   Hypertension    Incomplete uterovaginal prolapse 03/04/2004   Insomnia    Left bundle-branch block 05/20/2006   Lumbar radiculopathy 01/09/2016   Formatting of this note might be different from the original. Added automatically from request for surgery [id number]   LVH (left ventricular 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 05/23/2015   Non-rheumatic mitral regurgitation 03/21/2015   Osteopenia determined by x-ray 11/22/2015   Formatting of this note might be different from the original. Femoral neck   Other intervertebral disc degeneration, lumbar region 01/29/2015   Primary osteoarthritis, right hand 02/28/2002   Overview:  OA HANDS, ESP THUMBS   Radial tunnel syndrome, right 11/04/2016   Radiculopathy, lumbar region 11/22/2014   Overview:  Added automatically from request for surgery [id number]   Rhinitis, allergic 02/24/2002   Overview:  ALLERGY SYMPTOMS - states was seen by allergist in past and informed allergic to dust   Sacroiliitis ([place]) 08/02/2019   Screening for colon cancer 04/17/2005  Formatting of this note might be different from the original. 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 TRIGGER THUMBS - started spring 2003. 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 11/26/2015   Age-related osteoporosis without current pathological fracture 11/22/2015   Osteopenia determined by x-ray 11/22/2015   Allergic rhinitis due to pollen 08/19/2015   GAD (generalized anxiety disorder) 08/19/2015   LVH (left ventricular 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 05/23/2015   Chronic pain syndrome 04/01/2015   Non-rheumatic mitral regurgitation 03/21/2015   History of Bell's palsy 03/21/2015   Other intervertebral disc degeneration, lumbar region 01/29/2015   Spinal stenosis, lumbar region without neurogenic claudication 11/22/2014   Radiculopathy, lumbar region 11/22/2014   Cervical disc disorder 05/10/2009   Female cystocele 02/27/2009   Hx of adenomatous colonic polyps 12/20/2008   Celiac disease 07/12/2006   Left bundle-branch block 05/20/2006   Chronic cough 02/15/2006   Screening for colon cancer 04/17/2005   Unspecified asthma, uncomplicated 30/01/2329   Incomplete uterovaginal prolapse 03/04/2004   Primary osteoarthritis, right hand 02/28/2002   Carpal tunnel syndrome, bilateral 02/28/2002   Trigger thumb, unspecified thumb 02/28/2002   Rhinitis, allergic 02/24/2002   Essential hypertension 07/20/2000    Past Surgical History:  Procedure Laterality  Date   ABDOMINAL HYSTERECTOMY  2010   [place]   SPINAL FUSION  01/2016   TONSILLECTOMY AND ADENOIDECTOMY  1956   TOTAL HIP ARTHROPLASTY Left 09/17/2020   Procedure: TOTAL HIP ARTHROPLASTY ANTERIOR APPROACH;  Surgeon: Paralee Cancel, MD;  Location: [facility];  Service: Orthopedics;  Laterality: Left;  70 mins     OB History   No obstetric history on file.     Family History  Problem Relation Age of Onset   Arthritis Mother    Diabetes Mother    Pancreatic cancer Mother 2   Early death Father    Leukemia Father    Leukemia Sister    Early death Sister    Diabetes Sister     Social History   Tobacco Use   Smoking status: Former    Packs/day: 0.50    Years: 30.00    Pack years: 15.00    Types: Cigarettes    Quit date: 12/31/1997    Years since quitting: 23.1   Smokeless tobacco: Never   Tobacco comments:    Quit in 1999  Vaping Use   Vaping Use: Never used  Substance Use Topics   Alcohol use: Not Currently   Drug use: No  Home Medications Prior to Admission medications   Medication Sig Start Date End Date Taking? Authorizing Provider  amLODipine (NORVASC) 10 MG tablet Take 1 tablet (10 mg total) by mouth daily. 10/07/20   [name], FNP  Ascorbic Acid (VITAMIN C) 1000 MG tablet Take 2,000 mg by mouth daily.    [provider]  Calcium-Magnesium (CAL-MAG PO) Take 2,000 mg by mouth at bedtime. 1000 mg    [provider]  Carboxymethylcellul-Glycerin (CLEAR EYES FOR DRY EYES OP) Place 1 drop into both eyes daily.    [provider]  Cholecalciferol (VITAMIN D PO) Take 2,000 Units by mouth daily.    [provider]  dexlansoprazole (DEXILANT) 60 MG capsule Take 1 capsule (60 mg total) by mouth daily. 07/08/20   [name], FNP  fexofenadine [facility] ALLERGY) 180 MG tablet Take 1 tablet (180 mg total) by mouth daily. 10/31/20   [name], FNP  hydrochlorothiazide (HYDRODIURIL) 25 MG tablet Take 1  tablet (25 mg total) by mouth 2 (two) times daily. 10/07/20   [name], FNP  HYDROcodone-acetaminophen (NORCO/VICODIN) 5-325 MG tablet Take 0.5-1 tablets by mouth every 8 (eight) hours as needed for up to 5 days for severe pain (That is not improved by your scheduled acetaminophen regimen). Please do not exceed 4000 mg of acetaminophen (Tylenol) a 24-hour period. Please note that he may be prescribed additional medicine that contains acetaminophen. 02/15/21 02/20/21  Fatima Blank, MD  hydrocortisone (ANUSOL-HC) 25 MG suppository Place 1 suppository (25 mg total) rectally 2 (two) times daily as needed for hemorrhoids or anal itching. 07/08/20   [name], FNP  losartan (COZAAR) 50 MG tablet Take 1 tablet (50 mg total) by mouth daily. 10/07/20   [name], FNP  Magnesium Salicylate 268 MG TABS Take 325 mg by mouth daily.    [provider]  milk thistle 175 MG tablet Take 175 mg by mouth daily.    [provider]  mometasone (NASONEX) 50 MCG/ACT nasal spray Place 2 sprays into the nose daily. 10/31/20 10/31/21  [name], FNP  OVER THE COUNTER MEDICATION Take 2,000 mg by mouth daily. [place]-D'arco 1000 mg each    [provider]  polyethylene glycol (MIRALAX / GLYCOLAX) 17 g packet Take 17 g by mouth daily as needed for mild constipation. 09/18/20   Irving Copas, PA-C  Probiotic Product (PROBIOTIC DAILY PO) Take 1 tablet by mouth daily.    [provider]  traMADol (ULTRAM) 50 MG tablet SMARTSIG:1-2 Tablet(s) By Mouth 2-3 Times Daily PRN 01/09/21   [provider]  Vitamin A 2400 MCG (8000 UT) CAPS Take 16,000 Units by mouth daily. 8000 units    [provider]    Allergies    Gluten meal, Codeine, Dust mite extract, Gabapentin, and Terbinafine hcl  Review of Systems   Review of Systems  Constitutional:  Negative for fever.  Musculoskeletal:  Positive for arthralgias.  Skin:  Negative for wound.  Neurological:  Negative for  numbness.   Physical Exam Updated Vital Signs BP (!) 159/77   Pulse 93   Temp 98.2 F (36.8 C) (Oral)   Resp 20   SpO2 99%   Physical Exam Vitals and nursing note reviewed.  Constitutional:      General: She is not in acute distress.    Appearance: She is well-developed.  HENT:     Head: Atraumatic.  Eyes:     Conjunctiva/sclera: Conjunctivae normal.  Pulmonary:     Effort: Pulmonary effort  is normal.  Musculoskeletal:        General: Tenderness (Right lower extremity: Diffuse tenderness about the right tib-fib and calf region without any significant edema erythema or warmth noted.  Leg compartments soft.  Intact results pedis pulse) present.     Cervical back: Neck supple.  Skin:    Findings: No rash.  Neurological:     Mental Status: She is alert.  Psychiatric:        Mood and Affect: Mood normal.    ED Results / Procedures / Treatments   Labs (all labs ordered are listed, but only abnormal results are displayed) Labs Reviewed - No data to display  EKG None  Radiology DG Tibia/Fibula Right  Result Date: 02/15/2021 CLINICAL DATA:  Pain right mid posterior calf. History of right fibular fracture in July 2022 EXAM: RIGHT TIBIA AND FIBULA - 2 VIEW COMPARISON:  01/03/2021 FINDINGS: Callus formation around the midshaft right fibula consistent with healing of previously seen fracture. Alignment is normal. Diffuse bone demineralization. No new acute fracture or dislocation suggested. Soft tissues are unremarkable. IMPRESSION: Healing nondisplaced fracture of the midshaft right fibula. Electronically Signed   By: Lucienne Capers M.D.   On: 02/15/2021 23:46   US Venous Img Lower Right (DVT Study)  Result Date: 02/16/2021 CLINICAL DATA:  Edema.  Recent lymphoma diagnosis. EXAM: Right LOWER EXTREMITY VENOUS DOPPLER ULTRASOUND TECHNIQUE: Gray-scale sonography with compression, as well as color and duplex ultrasound, were performed to evaluate the deep venous system(s) from the  level of the common femoral vein through the popliteal and proximal calf veins. COMPARISON:  None. FINDINGS: VENOUS Normal compressibility of the common femoral, superficial femoral, and popliteal veins, as well as the visualized calf veins. Visualized portions of profunda femoral vein and great saphenous vein unremarkable. No filling defects to suggest DVT on grayscale or color Doppler imaging. Doppler waveforms show normal direction of venous flow, normal respiratory plasticity and response to augmentation. Limited views of the contralateral common femoral vein are unremarkable. OTHER None. Limitations: none IMPRESSION: Negative. Electronically Signed   By: Ronney Asters M.D.   On: 02/16/2021 15:35    Procedures Procedures   Medications Ordered in ED Medications - No data to display  ED Course  I have reviewed the triage vital signs and the nursing notes.  Pertinent labs & imaging results that were available during my care of the patient were reviewed by me and considered in my medical decision making (see chart for details).    MDM Rules/Calculators/A&P                           BP (!) 159/77   Pulse 93   Temp 98.2 F (36.8 C) (Oral)   Resp 20   SpO2 99%   Final Clinical Impression(s) / ED Diagnoses Final diagnoses:  Right leg pain    Rx / DC Orders ED Discharge Orders     None      2:26 PM Patient had a right fibular stress fracture diagnosed 5 weeks ago who has been doing fine however for the past few days she noticed pain to the same area.  She was seen yesterday for her complaint, had a negative x-ray and subsequently sent home.  She returns with concerns of potential DVT.  Plan to obtain vascular ultrasound to rule out DVT.  No evidence to suggest cellulitis.  Patient is neurovascular intact.  4:10 PM Fortunately venous ultrasounds of the right lower extremity  did not demonstrate any evidence of DVT.  Patient reassured.  She is stable for discharge.   Domenic Moras,  PA-C 02/16/21 1615    Pattricia Boss, MD 02/17/21 [phone]

## 2021-02-16 NOTE — ED Triage Notes (Signed)
Pt returns today, stating her right calf now hurts and she is worried about a blood clot.

## 2021-02-18 ENCOUNTER — Telehealth: Payer: Self-pay | Admitting: *Deleted

## 2021-02-18 DIAGNOSIS — C8580 Other specified types of non-Hodgkin lymphoma, unspecified site: Secondary | ICD-10-CM | POA: Insufficient documentation

## 2021-02-18 HISTORY — DX: Other specified types of non-hodgkin lymphoma, unspecified site: C85.80

## 2021-02-18 NOTE — Telephone Encounter (Signed)
The patient carries a diagnosis of marginal zone lymphoma, as stated the pathology report. When the PET CT scan was ordered by another provider the diagnosis of Mantle Cell Lymphoma was used to order the scan. The patient's problem list and notes still correctly shows marginal zone lymphoma. This was discussed at her last visit. If she would like a second opinion we would be happy to facilitate this (either within our institution or with an outside provider). However, I would emphasize that there is no confusion or ambiguity as to the diagnosis.

## 2021-02-18 NOTE — Telephone Encounter (Signed)
Patient called as she is getting a second opinion.  She has a discrepancy in the clinical reason for her PET scan as she stated that she was told it was not correct by Dr Lorenso Courier.  Routing to provider to see if they are aware of a way to correct this clinical reason.

## 2021-02-18 NOTE — Progress Notes (Signed)
[place] [phone]   [phone]  PROGRESS NOTE  Patient Care Team: [name], FNP as PCP - General (General Practice)  Hematological/Oncological History # Marginal Zone Lymphoma, Stage III/IV 01/14/2021: presented to the ED with cervical lymphadenopathy 01/15/2021: establish care with Dr. Burr Medico at the [facility] 01/20/2021: US guided lymph node biopsy, findings consistent with a marginal zone lymphoma 01/29/2021: transfer care to Dr. Lorenso Courier  02/07/2021: PET CT scan confirms nodal disease within the neck, chest, and pelvis. Most significant within the cervical regions. [place] 5  Interval History:  Deanna [name] 75 y.o. female with medical history significant for marginal zone lymphoma who presents for a follow up visit. The patient's last visit was on 01/29/2021. In the interim since the last visit her PET CT scan shows nodal disease in the neck, chest, and pelvis.   On exam today Deanna [name] is accompanied by her daughter. Deanna [name] notes she has been at her baseline level of health since her last visit.  She notes that the lymph nodes in her neck have not increased in size and remained approximately the same.  She notes that she is not having any symptoms such as fevers, chills, sweats, nausea, vomiting or diarrhea.  A full 10 point ROS is listed below.  The bulk of our discussion focused on the diagnosis of marginal zone lymphoma and the treatment options moving forward.  The details of this discussion are noted below.  MEDICAL HISTORY:  Past Medical History:  Diagnosis Date   Abnormal glucose 03/06/2020   Age-related osteoporosis without current pathological fracture 11/22/2015   Overview:  Femoral neck  Formatting of this note might be different from the original. Femoral neck   Allergic rhinitis due to pollen 08/19/2015   Allergy    Arthritis    Carpal tunnel syndrome, bilateral 02/28/2002   Overview:  H/O CTS   Celiac disease    Cervical  disc disorder 05/10/2009   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 11/26/2015   Formatting of this note might be different from the original. Added automatically from request for surgery [id number]   Chronic pain syndrome 04/01/2015   Colon polyps    Essential hypertension 07/20/2000   Overview:  HBP   Female cystocele 02/27/2009   GAD (generalized anxiety disorder) 08/19/2015   GERD (gastroesophageal reflux disease)    History of Bell's palsy 03/21/2015   Hx of adenomatous colonic polyps 12/20/2008   Overview:  Colonoscopy 07/2003 - [facility] - polyps removed - recommended repeat in 3 years Colonoscopy 09/2011 - Dr. Marian Sorrow - hyperplastic polyp(s) were removed - repeat in 5 years   Hypertension    Incomplete uterovaginal prolapse 03/04/2004   Insomnia    Left bundle-branch block 05/20/2006   Lumbar radiculopathy 01/09/2016   Formatting of this note might be different from the original. Added automatically from request for surgery [phone]   LVH (left ventricular 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 05/23/2015   Non-rheumatic mitral regurgitation 03/21/2015   Osteopenia determined by x-ray 11/22/2015   Formatting of this note might be different from the original. Femoral neck   Other intervertebral disc degeneration, lumbar region 01/29/2015   Primary osteoarthritis, right hand 02/28/2002   Overview:  OA HANDS, ESP THUMBS   Radial tunnel syndrome, right 11/04/2016   Radiculopathy, lumbar region 11/22/2014   Overview:  Added automatically from request for surgery [id number]   Rhinitis, allergic 02/24/2002   Overview:  ALLERGY SYMPTOMS - states was seen by allergist in past  and informed allergic to dust   Sacroiliitis ([place]) 08/02/2019   Screening for colon cancer 04/17/2005   Formatting of this note might be different from the original. Colonoscopy 2005   Sensorineural hearing loss (SNHL) of both ears 07/05/2019   pt can hear normally   Short sleeper 04/29/2020   Snoring  04/29/2020   Spinal stenosis    Spinal stenosis, lumbar region without neurogenic claudication 11/22/2014   Tinnitus aurium, bilateral 07/05/2019   Trigger thumb, unspecified thumb 02/28/2002   Formatting of this note might be different from the original. TRIGGER THUMBS - started spring 2003. Injected 02/28/02    SURGICAL HISTORY: Past Surgical History:  Procedure Laterality Date   ABDOMINAL HYSTERECTOMY  2010   [place]   SPINAL FUSION  01/2016   TONSILLECTOMY AND ADENOIDECTOMY  1956   TOTAL HIP ARTHROPLASTY Left 09/17/2020   Procedure: TOTAL HIP ARTHROPLASTY ANTERIOR APPROACH;  Surgeon: Paralee Cancel, MD;  Location: [facility];  Service: Orthopedics;  Laterality: Left;  70 mins    SOCIAL HISTORY: Social History   Socioeconomic History   Marital status: Divorced    Spouse name: Not on file   Number of children: 2   Years of education: 2 years college   Highest education level: Not on file  Occupational History   Occupation: Retired  Tobacco Use   Smoking status: Former    Packs/day: 0.50    Years: 30.00    Pack years: 15.00    Types: Cigarettes    Quit date: 12/31/1997    Years since quitting: 23.1   Smokeless tobacco: Never   Tobacco comments:    Quit in 1999  Vaping Use   Vaping Use: Never used  Substance and Sexual Activity   Alcohol use: Not Currently   Drug use: No   Sexual activity: Not Currently    Birth control/protection: Post-menopausal  Other Topics Concern   Not on file  Social History Narrative   Lives alone.   Right-handed.   One cup caffeine per day.   Social Determinants of Health   Financial Resource Strain: Low Risk    Difficulty of Paying Living Expenses: Not hard at all  Food Insecurity: No Food Insecurity   Worried About Charity fundraiser in the Last Year: Never true   [place] in the Last Year: Never true  Transportation Needs: No Transportation Needs   Lack of Transportation (Medical): No    Lack of Transportation (Non-Medical): No  Physical Activity: Inactive   Days of Exercise per Week: 0 days   Minutes of Exercise per Session: 0 min  Stress: Not on file  Social Connections: Not on file  Intimate Partner Violence: Not on file    FAMILY HISTORY: Family History  Problem Relation Age of Onset   Arthritis Mother    Diabetes Mother    Pancreatic cancer Mother 55   Early death Father    Leukemia Father    Leukemia Sister    Early death Sister    Diabetes Sister     ALLERGIES:  is allergic to gluten meal, codeine, dust mite extract, gabapentin, and terbinafine hcl.  MEDICATIONS:  Current Outpatient Medications  Medication Sig Dispense Refill   milk thistle 175 MG tablet Take 175 mg by mouth daily.     amLODipine (NORVASC) 10 MG tablet Take 1 tablet (10 mg total) by mouth daily. 90 tablet 1   Ascorbic Acid (VITAMIN C) 1000 MG tablet Take  2,000 mg by mouth daily.     Calcium-Magnesium (CAL-MAG PO) Take 2,000 mg by mouth at bedtime. 1000 mg     Carboxymethylcellul-Glycerin (CLEAR EYES FOR DRY EYES OP) Place 1 drop into both eyes daily.     Cholecalciferol (VITAMIN D PO) Take 2,000 Units by mouth daily.     dexlansoprazole (DEXILANT) 60 MG capsule Take 1 capsule (60 mg total) by mouth daily. 30 capsule 5   fexofenadine (ALLEGRA ALLERGY) 180 MG tablet Take 1 tablet (180 mg total) by mouth daily. 30 tablet 2   hydrochlorothiazide (HYDRODIURIL) 25 MG tablet Take 1 tablet (25 mg total) by mouth 2 (two) times daily. 180 tablet 3   HYDROcodone-acetaminophen (NORCO/VICODIN) 5-325 MG tablet Take 0.5-1 tablets by mouth every 8 (eight) hours as needed for up to 5 days for severe pain (That is not improved by your scheduled acetaminophen regimen). Please do not exceed 4000 mg of acetaminophen (Tylenol) a 24-hour period. Please note that he may be prescribed additional medicine that contains acetaminophen. 15 tablet 0   hydrocortisone (ANUSOL-HC) 25 MG suppository Place 1 suppository (25  mg total) rectally 2 (two) times daily as needed for hemorrhoids or anal itching. 12 suppository 3   losartan (COZAAR) 50 MG tablet Take 1 tablet (50 mg total) by mouth daily. 90 tablet 1   Magnesium Salicylate 720 MG TABS Take 325 mg by mouth daily.     mometasone (NASONEX) 50 MCG/ACT nasal spray Place 2 sprays into the nose daily. 1 each 2   OVER THE COUNTER MEDICATION Take 2,000 mg by mouth daily. Pau-D'arco 1000 mg each     polyethylene glycol (MIRALAX / GLYCOLAX) 17 g packet Take 17 g by mouth daily as needed for mild constipation. 14 each 0   Probiotic Product (PROBIOTIC DAILY PO) Take 1 tablet by mouth daily.     traMADol (ULTRAM) 50 MG tablet SMARTSIG:1-2 Tablet(s) By Mouth 2-3 Times Daily PRN     Vitamin A 2400 MCG (8000 UT) CAPS Take 16,000 Units by mouth daily. 8000 units     No current facility-administered medications for this visit.    REVIEW OF SYSTEMS:   Constitutional: ( - ) fevers, ( - )  chills , ( - ) night sweats Eyes: ( - ) blurriness of vision, ( - ) double vision, ( - ) watery eyes Ears, nose, mouth, throat, and face: ( - ) mucositis, ( - ) sore throat Respiratory: ( - ) cough, ( - ) dyspnea, ( - ) wheezes Cardiovascular: ( - ) palpitation, ( - ) chest discomfort, ( - ) lower extremity swelling Gastrointestinal:  ( - ) nausea, ( - ) heartburn, ( - ) change in bowel habits Skin: ( - ) abnormal skin rashes Lymphatics: ( - ) new lymphadenopathy, ( - ) easy bruising Neurological: ( - ) numbness, ( - ) tingling, ( - ) new weaknesses Behavioral/Psych: ( - ) mood change, ( - ) new changes  All other systems were reviewed with the patient and are negative.  PHYSICAL EXAMINATION: ECOG PERFORMANCE STATUS: 1 - Symptomatic but completely ambulatory  Vitals:   02/12/21 1039  BP: (!) 146/72  Pulse: 86  Resp: 18  Temp: 98.7 F (37.1 C)  SpO2: 97%   Filed Weights   02/12/21 1039  Weight: 150 lb (68 kg)    GENERAL: Well-appearing elderly African-American female,  alert, no distress and comfortable SKIN: skin color, texture, turgor are normal, no rashes or significant lesions EYES: conjunctiva are pink and  non-injected, sclera clear NECK: supple, non-tender LYMPH: Enlarged lymph nodes in the left cervical chain.  Otherwise no palpable lymphadenopathy in the cervical, axillary or inguinal lymph nodes LUNGS: clear to auscultation and percussion with normal breathing effort HEART: regular rate & rhythm and no murmurs and no lower extremity edema Musculoskeletal: no cyanosis of digits and no clubbing  PSYCH: alert & oriented x 3, fluent speech NEURO: no focal motor/sensory deficits  LABORATORY DATA:  I have reviewed the data as listed CBC Latest Ref Rng & Units 01/29/2021 01/14/2021 10/07/2020  WBC 4.0 - 10.5 K/uL 6.1 7.4 5.4  Hemoglobin 12.0 - 15.0 g/dL 13.7 13.3 11.5  Hematocrit 36.0 - 46.0 % 41.1 40.5 34.0  Platelets 150 - 400 K/uL 248 261 368    CMP Latest Ref Rng & Units 01/29/2021 01/14/2021 09/18/2020  Glucose 70 - 99 mg/dL 90 104(H) 138(H)  BUN 8 - 23 mg/dL 12 15 15   Creatinine 0.44 - 1.00 mg/dL 0.76 0.68 0.46  Sodium 135 - 145 mmol/L 141 139 137  Potassium 3.5 - 5.1 mmol/L 3.7 3.9 4.1  Chloride 98 - 111 mmol/L 103 102 104  CO2 22 - 32 mmol/L 27 28 26   Calcium 8.9 - 10.3 mg/dL 10.6(H) 10.4(H) 8.9  Total Protein 6.5 - 8.1 g/dL 7.8 - -  Total Bilirubin 0.3 - 1.2 mg/dL 0.7 - -  Alkaline Phos 38 - 126 U/L 82 - -  AST 15 - 41 U/L 20 - -  ALT 0 - 44 U/L 18 - -     RADIOGRAPHIC STUDIES:  DG Tibia/Fibula Right  Result Date: 02/15/2021 CLINICAL DATA:  Pain right mid posterior calf. History of right fibular fracture in July 2022 EXAM: RIGHT TIBIA AND FIBULA - 2 VIEW COMPARISON:  01/03/2021 FINDINGS: Callus formation around the midshaft right fibula consistent with healing of previously seen fracture. Alignment is normal. Diffuse bone demineralization. No new acute fracture or dislocation suggested. Soft tissues are unremarkable. IMPRESSION: Healing  nondisplaced fracture of the midshaft right fibula. Electronically Signed   By: Lucienne Capers M.D.   On: 02/15/2021 23:46   NM PET Image Initial (PI) Skull Base To Thigh  Addendum Date: 02/18/2021   ADDENDUM REPORT: 02/18/2021 15:31 ADDENDUM: The indication for the study should be MARGINAL  zone lymphoma. Electronically Signed   By: Abigail Miyamoto M.D.   On: 02/18/2021 15:31   Result Date: 02/18/2021 CLINICAL DATA:  Initial treatment strategy for mantle cell lymphoma of nodes in the neck. EXAM: NUCLEAR MEDICINE PET SKULL BASE TO THIGH TECHNIQUE: 7.3 mCi F-18 FDG was injected intravenously. Full-ring PET imaging was performed from the skull base to thigh after the radiotracer. CT data was obtained and used for attenuation correction and anatomic localization. Fasting blood glucose: 103 mg/dl COMPARISON:  Neck CT of 01/14/2021.  Chest CT 01/14/2021 FINDINGS: Mediastinal blood pool activity: SUV max 2.0 Liver activity: SUV max 2.9 NECK: Left roof of mouth hypermetabolism without soft tissue mass. Example at a S.U.V. max of 8.7 on 23/3. Extensive hypermetabolic bilateral cervical adenopathy. Left posterior triangle conglomerate of nodes measure up to 1.0 cm and a S.U.V. max of 16.0 on 25/3. Incidental CT findings: Deferred to recent diagnostic CT. CHEST: Bilateral hypermetabolic axillary adenopathy. An index right axillary node measures 1.0 cm and a S.U.V. max of 8.5 on 50/3. No mediastinal or hilar nodal hypermetabolism. Incidental CT findings: Deferred to recent diagnostic CT. Aortic and coronary artery calcification. ABDOMEN/PELVIS: Nonspecific left upper abdominal subcutaneous nodule of 5 mm and a S.U.V. max  of 2.0 on 104/3. No splenic or abdominal nodal hypermetabolism. Left pelvic sidewall hypermetabolic nodes, including at 5 mm and a S.U.V. max of 4.1 in the left external iliac station on 158/3. Left inguinal node measures 6 mm and a S.U.V. max of 3.0 on 166/3. Incidental CT findings: Normal adrenal  glands. Mild hepatic steatosis. Abdominal aortic atherosclerosis. Interpolar left renal collecting system 9 mm stone. Hysterectomy. Pelvic floor laxity. Scattered colonic diverticula. SKELETON: Multifocal osseous hypermetabolism. Examples within the proximal right femur at a S.U.V. max of 9.3 and left iliac wing at a S.U.V. max of 8.0. These are CT occult. Incidental CT findings: Left hip arthroplasty. Lumbar spine fixation. IMPRESSION: 1. Nodal disease within the neck, chest, and pelvis, as detailed above. Most significant within the cervical regions. ([place]) 5 2. Multifocal osseous hypermetabolism, consistent with marrow involvement. 3. Roof of mouth hypermetabolism for which lymphomatous involvement cannot be excluded. 4. Incidental findings, including: Coronary artery atherosclerosis. Aortic Atherosclerosis (ICD10-I70.0). Right nephrolithiasis. Mild hepatic steatosis. Electronically Signed: By: Abigail Miyamoto M.D. On: 02/09/2021 07:42   US Venous Img Lower Right (DVT Study)  Result Date: 02/16/2021 CLINICAL DATA:  Edema.  Recent lymphoma diagnosis. EXAM: Right LOWER EXTREMITY VENOUS DOPPLER ULTRASOUND TECHNIQUE: Gray-scale sonography with compression, as well as color and duplex ultrasound, were performed to evaluate the deep venous system(s) from the level of the common femoral vein through the popliteal and proximal calf veins. COMPARISON:  None. FINDINGS: VENOUS Normal compressibility of the common femoral, superficial femoral, and popliteal veins, as well as the visualized calf veins. Visualized portions of profunda femoral vein and great saphenous vein unremarkable. No filling defects to suggest DVT on grayscale or color Doppler imaging. Doppler waveforms show normal direction of venous flow, normal respiratory plasticity and response to augmentation. Limited views of the contralateral common femoral vein are unremarkable. OTHER None. Limitations: none IMPRESSION: Negative. Electronically Signed   By:  Ronney Asters M.D.   On: 02/16/2021 15:35   [place] CORE BIOPSY (LYMPH NODES)  Result Date: 01/20/2021 INDICATION: 75 year old female with a history left cervical adenopathy referred for biopsy EXAM: ULTRASOUND-GUIDED BIOPSY LEFT CERVICAL ADENOPATHY MEDICATIONS: None. ANESTHESIA/SEDATION: Moderate (conscious) sedation was employed during this procedure. A total of Versed 2.0 mg and Fentanyl 100 mcg was administered intravenously. Moderate Sedation Time: 10 minutes. The patient's level of consciousness and vital signs were monitored continuously by radiology nursing throughout the procedure under my direct supervision. FLUOROSCOPY TIME:  Ultrasound COMPLICATIONS: None PROCEDURE: Informed written consent was obtained from the patient after a thorough discussion of the procedural risks, benefits and alternatives. All questions were addressed. Maximal Sterile Barrier Technique was utilized including caps, mask, sterile gowns, sterile gloves, sterile drape, hand hygiene and skin antiseptic. A timeout was performed prior to the initiation of the procedure. Ultrasound survey was performed with images stored and sent to PACs. The left neck was prepped with chlorhexidine in a sterile fashion, and a sterile drape was applied covering the operative field. A sterile gown and sterile gloves were used for the procedure. Local anesthesia was provided with 1% Lidocaine. Ultrasound guidance was used to infiltrate the region with 1% lidocaine for local anesthesia. Using ultrasound guidance, 4 separate 18 gauge core needle biopsy were then acquired of the left cervical pathologic adenopathy using ultrasound guidance. Images were stored. Final image was stored after biopsy. Patient tolerated the procedure well and remained hemodynamically stable throughout. No complications were encountered and no significant blood loss was encounter IMPRESSION: Status post ultrasound-guided biopsy of left cervical pathologic  adenopathy. Signed, Dulcy Fanny. Dellia Nims, RPVI [facility] Electronically Signed   By: Corrie Mckusick D.O.   On: 01/20/2021 15:34    ASSESSMENT & PLAN Deanna [name] 75 y.o. female with medical history significant for marginal zone lymphoma who presents for a follow up visit.  At this time the patient's findings are most consistent with marginal zone lymphoma, stage III/IV.  Based on the GELF criteria the patient has a score of 0, indicating that immediate therapy is not required.  Given that this is an indolent lymphoma that he NCCN guidelines recommend observation as a category 1 recommendation.  We can continue to monitor closely in the event the patient were to have rapid progression or new cytopenias or symptoms we would be happy to start therapy.  I would recommend rituximab based therapy, most likely Bendamustine/rituximab.  The patient did seem somewhat skeptical about observational therapy and is also concerned that when the PET CT scan was ordered, the ordering provider noted the diagnosis was mantle cell lymphoma.  I was clear with her that the diagnosis was marginal zone lymphoma based on the pathology report.  I spoke with radiology to have this corrected and additionally have reviewed her problem list and assure that marginal zone lymphoma is the only cancer on her list.  #Marginal Zone Lymphoma, Stage III/IV -- PET CT scan confirms Stage III/IV, due to lymph nodes on both sides of diaphragm and apparent bone marrow involvement --based on the GELF Criteria for indolent lymphomas, treatment is not indicated. The patient carries a score 0. As such immediate therapy is not required. --per NCCN guidelines, observation is a category 1 recommendation in her situation. If therapy were to be indicated would proceed with rituximab based therapy, likely Bendamustine/Rituximab.  --repeat CT scan in 6 months time or sooner if patient were to develop symptoms.  --will  have patient return to clinic in 3 months time for continued monitoring.   No orders of the defined types were placed in this encounter.   All questions were answered. The patient knows to call the clinic with any problems, questions or concerns.  A total of more than 30 minutes were spent on this encounter with face-to-face time and non-face-to-face time, including preparing to see the patient, ordering tests and/or medications, counseling the patient and coordination of care as outlined above.   Ledell Peoples, MD Department of Hematology/Oncology [place] [facility] Phone: [phone] Pager: [phone] Email: [email or web address]  02/18/2021 3:41 PM

## 2021-02-26 ENCOUNTER — Emergency Department (HOSPITAL_BASED_OUTPATIENT_CLINIC_OR_DEPARTMENT_OTHER)
Admission: EM | Admit: 2021-02-26 | Discharge: 2021-02-26 | Disposition: A | Discharge status: Home / Self Care | Payer: MEDICARE | Attending: Emergency Medicine | Admitting: Emergency Medicine

## 2021-02-26 ENCOUNTER — Emergency Department (HOSPITAL_BASED_OUTPATIENT_CLINIC_OR_DEPARTMENT_OTHER): Payer: MEDICARE

## 2021-02-26 ENCOUNTER — Other Ambulatory Visit: Payer: Self-pay

## 2021-02-26 ENCOUNTER — Encounter (HOSPITAL_BASED_OUTPATIENT_CLINIC_OR_DEPARTMENT_OTHER): Payer: Self-pay | Admitting: Emergency Medicine

## 2021-02-26 DIAGNOSIS — Z96642 Presence of left artificial hip joint: Secondary | ICD-10-CM | POA: Diagnosis not present

## 2021-02-26 DIAGNOSIS — Z87891 Personal history of nicotine dependence: Secondary | ICD-10-CM | POA: Diagnosis not present

## 2021-02-26 DIAGNOSIS — R590 Localized enlarged lymph nodes: Secondary | ICD-10-CM | POA: Insufficient documentation

## 2021-02-26 DIAGNOSIS — I10 Essential (primary) hypertension: Secondary | ICD-10-CM | POA: Diagnosis not present

## 2021-02-26 DIAGNOSIS — Z79899 Other long term (current) drug therapy: Secondary | ICD-10-CM | POA: Diagnosis not present

## 2021-02-26 DIAGNOSIS — J45909 Unspecified asthma, uncomplicated: Secondary | ICD-10-CM | POA: Diagnosis not present

## 2021-02-26 DIAGNOSIS — R591 Generalized enlarged lymph nodes: Secondary | ICD-10-CM

## 2021-02-26 LAB — COMPREHENSIVE METABOLIC PANEL
ALT: 15 U/L (ref 0–44)
AST: 17 U/L (ref 15–41)
Albumin: 4.4 g/dL (ref 3.5–5.0)
Alkaline Phosphatase: 60 U/L (ref 38–126)
Anion gap: 9 (ref 5–15)
BUN: 10 mg/dL (ref 8–23)
CO2: 28 mmol/L (ref 22–32)
Calcium: 9.9 mg/dL (ref 8.9–10.3)
Chloride: 101 mmol/L (ref 98–111)
Creatinine, Ser: 0.57 mg/dL (ref 0.44–1.00)
GFR, Estimated: >60 mL/min (ref >60)
Glucose, Bld: 98 mg/dL (ref 70–99)
Potassium: 3.3 mmol/L — ABNORMAL LOW (ref 3.5–5.1)
Sodium: 138 mmol/L (ref 135–145)
Total Bilirubin: 0.5 mg/dL (ref 0.3–1.2)
Total Protein: 7 g/dL (ref 6.5–8.1)

## 2021-02-26 LAB — CBC WITH DIFFERENTIAL/PLATELET
Abs Immature Granulocytes: 0.01 10*3/uL (ref 0.00–0.07)
Basophils Absolute: 0.1 10*3/uL (ref 0.0–0.1)
Basophils Relative: 1 %
Eosinophils Absolute: 0.3 10*3/uL (ref 0.0–0.5)
Eosinophils Relative: 5 %
HCT: 39.2 % (ref 36.0–46.0)
Hemoglobin: 13 g/dL (ref 12.0–15.0)
Immature Granulocytes: 0 %
Lymphocytes Relative: 23 %
Lymphs Abs: 1.4 10*3/uL (ref 0.7–4.0)
MCH: 26.9 pg (ref 26.0–34.0)
MCHC: 33.2 g/dL (ref 30.0–36.0)
MCV: 81.2 fL (ref 80.0–100.0)
Monocytes Absolute: 0.9 10*3/uL (ref 0.1–1.0)
Monocytes Relative: 15 %
Neutro Abs: 3.3 10*3/uL (ref 1.7–7.7)
Neutrophils Relative %: 56 %
Platelets: 248 10*3/uL (ref 150–400)
RBC: 4.83 MIL/uL (ref 3.87–5.11)
RDW: 14.6 % (ref 11.5–15.5)
WBC: 5.9 10*3/uL (ref 4.0–10.5)
nRBC: 0 % (ref 0.0–0.2)

## 2021-02-26 MED ORDER — IOHEXOL 350 MG/ML SOLN
75.0000 mL | Freq: Once | INTRAVENOUS | Status: AC | PRN
  Administered 2021-02-26: 75 mL via INTRAVENOUS

## 2021-02-26 NOTE — Discharge Instructions (Addendum)
You were seen and evaluated in the emergency department today for further evaluation of lymphadenopathy.  As we discussed, your scan did not show anything significant at this time apart from an increased size of that lymph node on her left side of her neck.  We have spoke with your oncology team who will contact you within the next couple days to schedule a follow-up appointment in their office.  Please return to the emergency department if you are experiencing worsening swelling, trouble breathing, trouble talking, trouble swallowing, or any other concerns you might have.

## 2021-02-26 NOTE — ED Triage Notes (Signed)
Reports swelling to lymph node in the left side of her neck for the last two hours.  Denies any pain.  Also c/o tingling in the right arm for the last two days.

## 2021-02-26 NOTE — ED Notes (Signed)
Called Dr Burr Medico to Dr Kathrynn Humble

## 2021-02-26 NOTE — ED Provider Notes (Signed)
Deanna [name]   CSN: [id number] Arrival date & time: 02/26/21  1711     History Chief Complaint  Patient presents with   Lymphadenopathy    Deanna [name] is a 75 y.o. female with history of marginal zone lymphoma who presents to the emergency department for worsening left-sided neck swelling of the last 5 hours.  Patient states that she was at the [facility] getting her picture taken for renewal of her license when she noticed a large lump in her neck right under her jaw on the left side.  It has been constant since onset.  Patient was recently diagnosed with what she says is non-Hodgkin lymphoma had biopsies and PET scan which revealed stage III/IV lymphoma.  Due to the nature of the spread of disease patient told me there watchful waiting until she developed symptoms.  She reports associated change in her voice which she describes as a hoarse sensation over the last week.  She denies any trouble breathing, trouble swallowing, cough, congestion, fever, chills, chest pain, shortness of breath, nausea, vomiting, diarrhea.  HPI     Past Medical History:  Diagnosis Date   Abnormal glucose 03/06/2020   Age-related osteoporosis without current pathological fracture 11/22/2015   Overview:  Femoral neck  Formatting of this [name] might be different from the original. Femoral neck   Allergic rhinitis due to pollen 08/19/2015   Allergy    Arthritis    Carpal tunnel syndrome, bilateral 02/28/2002   Overview:  H/O CTS   Celiac disease    Cervical disc disorder 05/10/2009   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 11/26/2015   Formatting of this [name] might be different from the original. Added automatically from request for surgery [id number]   Chronic pain syndrome 04/01/2015   Colon polyps    Essential hypertension 07/20/2000   Overview:  HBP   Female cystocele 02/27/2009   GAD (generalized anxiety disorder) 08/19/2015   GERD  (gastroesophageal reflux disease)    History of Bell's palsy 03/21/2015   Hx of adenomatous colonic polyps 12/20/2008   Overview:  Colonoscopy 07/2003 - [facility] - polyps removed - recommended repeat in 3 years Colonoscopy 09/2011 - Dr. Marian Sorrow - hyperplastic polyp(s) were removed - repeat in 5 years   Hypertension    Incomplete uterovaginal prolapse 03/04/2004   Insomnia    Left bundle-branch block 05/20/2006   Lumbar radiculopathy 01/09/2016   Formatting of this [name] might be different from the original. Added automatically from request for surgery [id number]   LVH (left ventricular 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 05/23/2015   Non-rheumatic mitral regurgitation 03/21/2015   Osteopenia determined by x-ray 11/22/2015   Formatting of this [name] might be different from the original. Femoral neck   Other intervertebral disc degeneration, lumbar region 01/29/2015   Primary osteoarthritis, right hand 02/28/2002   Overview:  OA HANDS, ESP THUMBS   Radial tunnel syndrome, right 11/04/2016   Radiculopathy, lumbar region 11/22/2014   Overview:  Added automatically from request for surgery [id number]   Rhinitis, allergic 02/24/2002   Overview:  ALLERGY SYMPTOMS - states was seen by allergist in past and informed allergic to dust   Sacroiliitis ([place]) 08/02/2019   Screening for colon cancer 04/17/2005   Formatting of this [name] might be different from the original. 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 TRIGGER THUMBS - started spring 2003. 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   Radial tunnel syndrome, right 11/04/2016   Lumbar radiculopathy 01/09/2016   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 11/26/2015   Age-related osteoporosis without current pathological fracture 11/22/2015   Osteopenia determined by x-ray 11/22/2015   Allergic rhinitis due to pollen 08/19/2015   GAD (generalized anxiety disorder) 08/19/2015   LVH (left ventricular 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 05/23/2015   Chronic pain syndrome 04/01/2015   Non-rheumatic mitral regurgitation 03/21/2015   History of Bell's palsy 03/21/2015   Other intervertebral disc degeneration, lumbar region 01/29/2015   Spinal stenosis, lumbar region without neurogenic claudication 11/22/2014   Radiculopathy, lumbar region 11/22/2014   Cervical disc disorder 05/10/2009   Female cystocele 02/27/2009   Hx of adenomatous colonic polyps 12/20/2008   Celiac disease 07/12/2006   Left bundle-branch block 05/20/2006   Chronic cough 02/15/2006   Screening for colon cancer 04/17/2005   Unspecified asthma, uncomplicated 01/12/4817   Incomplete uterovaginal prolapse 03/04/2004   Primary osteoarthritis, right hand 02/28/2002   Carpal tunnel syndrome, bilateral 02/28/2002   Trigger thumb, unspecified thumb 02/28/2002   Rhinitis, allergic 02/24/2002   Essential hypertension 07/20/2000    Past Surgical History:  Procedure Laterality Date   ABDOMINAL HYSTERECTOMY  2010   [facility]   SPINAL FUSION  01/2016   TONSILLECTOMY AND  ADENOIDECTOMY  1956   TOTAL HIP ARTHROPLASTY Left 09/17/2020   Procedure: TOTAL HIP ARTHROPLASTY ANTERIOR APPROACH;  Surgeon: Paralee Cancel, MD;  Location: [facility];  Service: Orthopedics;  Laterality: Left;  70 mins     OB History   No obstetric history on file.     Family History  Problem Relation Age of Onset   Arthritis Mother    Diabetes Mother    Pancreatic cancer Mother 12   Early death Father    Leukemia Father    Leukemia Sister    Early death Sister    Diabetes Sister     Social History   Tobacco Use   Smoking status: Former    Packs/day: 0.50    Years: 30.00    Pack years: 15.00    Types: Cigarettes    Quit date: 12/31/1997    Years since quitting: 23.1   Smokeless tobacco: Never   Tobacco comments:    Quit in 1999  Vaping Use   Vaping Use: Never used  Substance Use Topics   Alcohol use: Not Currently   Drug use: No    Home Medications Prior to Admission medications   Medication Sig Start Date End Date Taking? Authorizing Provider  amLODipine (NORVASC) 10 MG tablet Take 1 tablet (10 mg total) by mouth daily. 10/07/20   Laurance Flatten,  Doreene Burke, FNP  Ascorbic Acid (VITAMIN C) 1000 MG tablet Take 2,000 mg by mouth daily.    [provider]  Calcium-Magnesium (CAL-MAG PO) Take 2,000 mg by mouth at bedtime. 1000 mg    [provider]  Carboxymethylcellul-Glycerin (CLEAR EYES FOR DRY EYES OP) Place 1 drop into both eyes daily.    [provider]  Cholecalciferol (VITAMIN D PO) Take 2,000 Units by mouth daily.    [provider]  dexlansoprazole (DEXILANT) 60 MG capsule Take 1 capsule (60 mg total) by mouth daily. 07/08/20   [name], FNP  fexofenadine [facility] ALLERGY) 180 MG tablet Take 1 tablet (180 mg total) by mouth daily. 10/31/20   [name], FNP  hydrochlorothiazide (HYDRODIURIL) 25 MG tablet Take 1 tablet (25 mg total) by mouth 2 (two) times daily. 10/07/20   [name], FNP  hydrocortisone (ANUSOL-HC) 25 MG suppository Place  1 suppository (25 mg total) rectally 2 (two) times daily as needed for hemorrhoids or anal itching. 07/08/20   [name], FNP  losartan (COZAAR) 50 MG tablet Take 1 tablet (50 mg total) by mouth daily. 10/07/20   [name], FNP  Magnesium Salicylate 030 MG TABS Take 325 mg by mouth daily.    [provider]  milk thistle 175 MG tablet Take 175 mg by mouth daily.    [provider]  mometasone (NASONEX) 50 MCG/ACT nasal spray Place 2 sprays into the nose daily. 10/31/20 10/31/21  [name], FNP  OVER THE COUNTER MEDICATION Take 2,000 mg by mouth daily. Pau-D'arco 1000 mg each    [provider]  polyethylene glycol (MIRALAX / GLYCOLAX) 17 g packet Take 17 g by mouth daily as needed for mild constipation. 09/18/20   Irving Copas, PA-C  Probiotic Product (PROBIOTIC DAILY PO) Take 1 tablet by mouth daily.    [provider]  traMADol (ULTRAM) 50 MG tablet SMARTSIG:1-2 Tablet(s) By Mouth 2-3 Times Daily PRN 01/09/21   [provider]  Vitamin A 2400 MCG (8000 UT) CAPS Take 16,000 Units by mouth daily. 8000 units    [provider]    Allergies    Gluten meal, Codeine, Dust mite extract, Gabapentin, and Terbinafine hcl  Review of Systems   Review of Systems  All other systems reviewed and are negative.  Physical Exam Updated Vital Signs BP 133/80 (BP Location: Right Arm)   Pulse 91   Temp 98.6 F (37 C)   Resp 17   Ht 5' 1.5" (1.562 m)   Wt 67.6 kg   SpO2 96%   BMI 27.70 kg/m   Physical Exam Constitutional:      General: She is not in acute distress.    Appearance: Normal appearance.  HENT:     Head: Normocephalic and atraumatic.     Ears:     Comments: External ears are nontender to palpation.  External auditory canals are clear without any evidence of cerumen.  Bilateral TMs are clear without any evidence of effusion or erythema.    Mouth/Throat:     Comments: Tongue is normal.  No evidence of pharyngeal erythema or  edema.  Uvula is midline and nonedematous or erythematous.  Tonsils are normal.  No evidence of exudate or peritonsillar abscess.  No evidence of trismus. Patient is speaking normally.  Eyes:     General:        Right eye: No discharge.        Left eye: No discharge.  Cardiovascular:  Comments: Regular rate and rhythm.  S1/S2 are distinct without any evidence of murmur, rubs, or gallops.  Radial pulses are 2+ bilaterally.  Dorsalis pedis pulses are 2+ bilaterally.  No evidence of pedal edema. Pulmonary:     Comments: Clear to auscultation bilaterally.  Normal effort.  No respiratory distress.  No evidence of wheezes, rales, or rhonchi heard throughout. Abdominal:     General: Abdomen is flat. Bowel sounds are normal. There is no distension.     Tenderness: There is no abdominal tenderness. There is no guarding or rebound.  Musculoskeletal:        General: Normal range of motion.     Cervical back: Neck supple.  Lymphadenopathy:     Comments: Diffuse lymphadenopathy over the left and right anterior cervical chain.  She has lymphadenopathy over the submandibular, tonsillar, and submental regions.  There is a large golf ball sized lymph node over the left submental area.  Diffuse supraclavicular lymphadenopathy bilaterally.  Skin:    General: Skin is warm and dry.     Findings: No rash.  Neurological:     General: No focal deficit present.     Mental Status: She is alert.  Psychiatric:        Mood and Affect: Mood normal.        Behavior: Behavior normal.    ED Results / Procedures / Treatments   Labs (all labs ordered are listed, but only abnormal results are displayed) Labs Reviewed  COMPREHENSIVE METABOLIC PANEL - Abnormal; Notable for the following components:      Result Value   Potassium 3.3 (*)    All other components within normal limits  CBC WITH DIFFERENTIAL/PLATELET    EKG None  Radiology CT Soft Tissue Neck W Contrast  Result Date: 02/26/2021 CLINICAL DATA:   Left cervical lymphadenopathy EXAM: CT NECK WITH CONTRAST TECHNIQUE: Multidetector CT imaging of the neck was performed using the standard protocol following the bolus administration of intravenous contrast. CONTRAST:  58m OMNIPAQUE IOHEXOL 350 MG/ML SOLN COMPARISON:  01/14/2021 FINDINGS: PHARYNX AND LARYNX: The nasopharynx, oropharynx and larynx are normal. Visible portions of the oral cavity, tongue base and floor of mouth are normal. Normal epiglottis, vallecula and pyriform sinuses. The larynx is normal. No retropharyngeal abscess, effusion or lymphadenopathy. SALIVARY GLANDS: Normal parotid, submandibular and sublingual glands. THYROID: Normal. LYMPH NODES: There are multiple enlarged left-sided lymph nodes. Left level 1B node measuring 16 mm, previously 14 mm. Unchanged 17 mm left level 2A lymph node (image 56). Numerous other mildly enlarged left-sided lymph nodes are unchanged. VASCULAR: Calcific aortic atherosclerosis. LIMITED INTRACRANIAL: Normal VISUALIZED ORBITS: Normal. MASTOIDS AND VISUALIZED PARANASAL SINUSES: No fluid levels or advanced mucosal thickening. No mastoid effusion. SKELETON: No bony spinal canal stenosis. No lytic or blastic lesions. UPPER CHEST: Clear. OTHER: None. IMPRESSION: 1. Mildly increased size of left submandibular lymph node. Otherwise unchanged appearance of multiple enlarged left-sided lymph nodes, measuring up to 17 mm. Findings remain concerning for lymphoma. Histologic sampling should be considered. Aortic Atherosclerosis (ICD10-I70.0). Electronically Signed   By: KUlyses JarredM.D.   On: 02/26/2021 21:34    Procedures Procedures   Medications Ordered in ED Medications  iohexol (OMNIPAQUE) 350 MG/ML injection 75 mL (75 mLs Intravenous Contrast Given 02/26/21 2104)    ED Course  I have reviewed the triage vital signs and the nursing notes.  Pertinent labs & imaging results that were available during my care of the patient were reviewed by me and considered in my  medical decision  making (see chart for details).    MDM Rules/Calculators/A&P                          Deanna [name] is a 75 y.o. female who presents the emergency department for further evaluation of lymphadenopathy.  Given her history of non-Hodgkin's lymphoma this is likely a subsequent progression of her disease.  She is talking normally and does not appear to be in any respiratory distress at this time.  Able to tolerate her own secretions.  No oral pharyngeal compromise at this time.  We will have her follow-up with her oncologist who will contact her with the next couple days.  Strict return precautions given.   Final Clinical Impression(s) / ED Diagnoses Final diagnoses:  Lymphadenopathy    Rx / DC Orders ED Discharge Orders     None        Cherrie Gauze 02/26/21 2225    Varney Biles, MD 02/27/21 [phone]

## 2021-02-27 ENCOUNTER — Telehealth: Payer: Self-pay | Admitting: *Deleted

## 2021-02-27 NOTE — Telephone Encounter (Signed)
TCT patient regarding her concerns with new enlarged lymph node under her chin towards the left.  She went to the ED yesterday about this and remains concerned.  Spoke to patient and offered her a visit with Dr. Lorenso Courier on 03/03/21 to review scans and re-discuss treatment options. She is very agreeable to this and is good with appt @ 10 am on 03/03/21

## 2021-03-03 ENCOUNTER — Ambulatory Visit: Payer: MEDICARE | Admitting: Hematology and Oncology

## 2021-03-04 ENCOUNTER — Telehealth: Payer: Self-pay | Admitting: Hematology and Oncology

## 2021-03-04 NOTE — Telephone Encounter (Signed)
Scheduled per 10/3 sch msg, pt has been called and confirmed appt.

## 2021-03-11 ENCOUNTER — Ambulatory Visit: Payer: MEDICARE | Admitting: Nurse Practitioner

## 2021-03-12 ENCOUNTER — Inpatient Hospital Stay: Payer: MEDICARE | Attending: Hematology

## 2021-03-12 ENCOUNTER — Other Ambulatory Visit: Payer: Self-pay

## 2021-03-12 ENCOUNTER — Inpatient Hospital Stay (HOSPITAL_BASED_OUTPATIENT_CLINIC_OR_DEPARTMENT_OTHER): Payer: MEDICARE | Admitting: Hematology and Oncology

## 2021-03-12 ENCOUNTER — Other Ambulatory Visit: Payer: Self-pay | Admitting: Hematology and Oncology

## 2021-03-12 VITALS — BP 139/68 | HR 83 | Temp 98.1°F | Resp 18 | Wt 147.4 lb

## 2021-03-12 DIAGNOSIS — I10 Essential (primary) hypertension: Secondary | ICD-10-CM | POA: Insufficient documentation

## 2021-03-12 DIAGNOSIS — C8588 Other specified types of non-Hodgkin lymphoma, lymph nodes of multiple sites: Secondary | ICD-10-CM | POA: Diagnosis present

## 2021-03-12 DIAGNOSIS — Z8719 Personal history of other diseases of the digestive system: Secondary | ICD-10-CM | POA: Insufficient documentation

## 2021-03-12 DIAGNOSIS — K219 Gastro-esophageal reflux disease without esophagitis: Secondary | ICD-10-CM | POA: Insufficient documentation

## 2021-03-12 DIAGNOSIS — F411 Generalized anxiety disorder: Secondary | ICD-10-CM | POA: Insufficient documentation

## 2021-03-12 DIAGNOSIS — G894 Chronic pain syndrome: Secondary | ICD-10-CM | POA: Diagnosis not present

## 2021-03-12 DIAGNOSIS — C8581 Other specified types of non-Hodgkin lymphoma, lymph nodes of head, face, and neck: Secondary | ICD-10-CM

## 2021-03-12 DIAGNOSIS — C8580 Other specified types of non-Hodgkin lymphoma, unspecified site: Secondary | ICD-10-CM | POA: Diagnosis not present

## 2021-03-12 LAB — CBC WITH DIFFERENTIAL (CANCER CENTER ONLY)
Abs Immature Granulocytes: 0.01 10*3/uL (ref 0.00–0.07)
Basophils Absolute: 0.1 10*3/uL (ref 0.0–0.1)
Basophils Relative: 1 %
Eosinophils Absolute: 0.3 10*3/uL (ref 0.0–0.5)
Eosinophils Relative: 4 %
HCT: 40.2 % (ref 36.0–46.0)
Hemoglobin: 13.5 g/dL (ref 12.0–15.0)
Immature Granulocytes: 0 %
Lymphocytes Relative: 26 %
Lymphs Abs: 1.5 10*3/uL (ref 0.7–4.0)
MCH: 27.3 pg (ref 26.0–34.0)
MCHC: 33.6 g/dL (ref 30.0–36.0)
MCV: 81.4 fL (ref 80.0–100.0)
Monocytes Absolute: 0.7 10*3/uL (ref 0.1–1.0)
Monocytes Relative: 13 %
Neutro Abs: 3.1 10*3/uL (ref 1.7–7.7)
Neutrophils Relative %: 56 %
Platelet Count: 249 10*3/uL (ref 150–400)
RBC: 4.94 MIL/uL (ref 3.87–5.11)
RDW: 14.3 % (ref 11.5–15.5)
WBC Count: 5.7 10*3/uL (ref 4.0–10.5)
nRBC: 0 % (ref 0.0–0.2)

## 2021-03-12 LAB — CMP (CANCER CENTER ONLY)
ALT: 18 U/L (ref 0–44)
AST: 19 U/L (ref 15–41)
Albumin: 4.5 g/dL (ref 3.5–5.0)
Alkaline Phosphatase: 64 U/L (ref 38–126)
Anion gap: 7 (ref 5–15)
BUN: 15 mg/dL (ref 8–23)
CO2: 26 mmol/L (ref 22–32)
Calcium: 9.7 mg/dL (ref 8.9–10.3)
Chloride: 105 mmol/L (ref 98–111)
Creatinine: 0.53 mg/dL (ref 0.44–1.00)
GFR, Estimated: >60 mL/min (ref >60)
Glucose, Bld: 105 mg/dL — ABNORMAL HIGH (ref 70–99)
Potassium: 4 mmol/L (ref 3.5–5.1)
Sodium: 138 mmol/L (ref 135–145)
Total Bilirubin: 0.7 mg/dL (ref 0.3–1.2)
Total Protein: 7.3 g/dL (ref 6.5–8.1)

## 2021-03-12 LAB — LACTATE DEHYDROGENASE: LDH: 158 U/L (ref 98–192)

## 2021-03-16 NOTE — Progress Notes (Signed)
[place] [phone]   [phone]  PROGRESS NOTE  Patient Care Team: [name], FNP as PCP - General (General Practice)  Hematological/Oncological History # Marginal Zone Lymphoma, Stage III/IV 01/14/2021: presented to the ED with cervical lymphadenopathy 01/15/2021: establish care with Dr. Burr Medico at the [facility] 01/20/2021: US guided lymph node biopsy, findings consistent with a marginal zone lymphoma 01/29/2021: transfer care to Dr. Lorenso Courier  02/07/2021: PET CT scan confirms nodal disease within the neck, chest, and pelvis. Most significant within the cervical regions. [place] 5  Interval History:  Deanna [name] 75 y.o. female with medical history significant for marginal zone lymphoma who presents for a follow up visit. The patient's last visit was on 02/12/2021. In the interim since the last visit she has sought a second opinion at [facility] in [place].  On exam today Deanna [name] is accompanied by her daughter. Deanna [name] notes her health has been stable in the interim since her last visit.  She has had no worsening swelling of the lymph nodes and no other concerning symptoms.  She denies any dysphagia, difficulty breathing, or B symptoms.  Overall she is stable as compared to her prior visit..  She notes that she is not having any symptoms such as fevers, chills, sweats, nausea, vomiting or diarrhea.  A full 10 point ROS is listed below.  The bulk of our discussion focused on assuring we are on the same page regarding diagnosis and treatment options.  The patient notes that at her second opinion in [facility] that they agreed with our management of close monitoring with starting of therapy if she were to develop new or worsening symptoms.  She continues to express her discontentment with the mantle cell lymphoma diagnosis which was noted on the CT scan report.  I will discuss with administration to see if there is anything that can be  done to change the situation.  MEDICAL HISTORY:  Past Medical History:  Diagnosis Date   Abnormal glucose 03/06/2020   Age-related osteoporosis without current pathological fracture 11/22/2015   Overview:  Femoral neck  Formatting of this note might be different from the original. 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 Added automatically from request for surgery [id number]   Chronic pain syndrome 04/01/2015   Colon polyps    Essential hypertension 07/20/2000   Overview:  HBP   Female cystocele 02/27/2009   GAD (generalized anxiety disorder) 08/19/2015   GERD (gastroesophageal reflux disease)    History of Bell's palsy 03/21/2015   Hx of adenomatous colonic polyps 12/20/2008   Overview:  Colonoscopy 07/2003 - [facility] - polyps removed - recommended repeat in 3 years Colonoscopy 09/2011 - Dr. Marian Sorrow - hyperplastic polyp(s) were removed - repeat in 5 years   Hypertension    Incomplete uterovaginal prolapse 03/04/2004   Insomnia    Left bundle-branch block 05/20/2006   Lumbar radiculopathy 01/09/2016   Formatting of this note might be different from the original. Added automatically from request for surgery [id number]   LVH (left ventricular 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 05/23/2015   Non-rheumatic mitral regurgitation 03/21/2015   Osteopenia determined by x-ray 11/22/2015  Formatting of this note might be different from the original. Femoral neck   Other intervertebral disc degeneration, lumbar region 01/29/2015   Primary osteoarthritis, right hand 02/28/2002   Overview:  OA HANDS, ESP THUMBS   Radial tunnel syndrome, right 11/04/2016   Radiculopathy, lumbar region 11/22/2014   Overview:   Added automatically from request for surgery [id number]   Rhinitis, allergic 02/24/2002   Overview:  ALLERGY SYMPTOMS - states was seen by allergist in past and informed allergic to dust   Sacroiliitis ([place]) 08/02/2019   Screening for colon cancer 04/17/2005   Formatting of this note might be different from the original. 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 TRIGGER THUMBS - started spring 2003. Injected 02/28/02    SURGICAL HISTORY: Past Surgical History:  Procedure Laterality Date   ABDOMINAL HYSTERECTOMY  2010   [place]   SPINAL FUSION  01/2016   TONSILLECTOMY AND ADENOIDECTOMY  1956   TOTAL HIP ARTHROPLASTY Left 09/17/2020   Procedure: TOTAL HIP ARTHROPLASTY ANTERIOR APPROACH;  Surgeon: Paralee Cancel, MD;  Location: [facility];  Service: Orthopedics;  Laterality: Left;  70 mins    SOCIAL HISTORY: Social History   Socioeconomic History   Marital status: Divorced    Spouse name: Not on file   Number of children: 2   Years of education: 2 years college   Highest education level: Not on file  Occupational History   Occupation: Retired  Tobacco Use   Smoking status: Former    Packs/day: 0.50    Years: 30.00    Pack years: 15.00    Types: Cigarettes    Quit date: 12/31/1997    Years since quitting: 23.2   Smokeless tobacco: Never   Tobacco comments:    Quit in 1999  Vaping Use   Vaping Use: Never used  Substance and Sexual Activity   Alcohol use: Not Currently   Drug use: No   Sexual activity: Not Currently    Birth control/protection: Post-menopausal  Other Topics Concern   Not on file   Social History Narrative   Lives alone.   Right-handed.   One cup caffeine per day.   Social Determinants of Health   Financial Resource Strain: Low Risk    Difficulty of Paying Living Expenses: Not hard at all  Food Insecurity: No Food Insecurity   Worried About Charity fundraiser in the Last Year: Never true   [place] in the Last Year: Never true  Transportation Needs: No Transportation Needs   Lack of Transportation (Medical): No   Lack of Transportation (Non-Medical): No  Physical Activity: Inactive   Days of Exercise per Week: 0 days   Minutes of Exercise per Session: 0 min  Stress: Not on file  Social Connections: Not on file  Intimate Partner Violence: Not on file    FAMILY HISTORY: Family History  Problem Relation Age of Onset   Arthritis Mother    Diabetes Mother    Pancreatic cancer Mother 6   Early death Father    Leukemia Father    Leukemia Sister    Early death Sister  Diabetes Sister     ALLERGIES:  is allergic to gluten meal, codeine, dust mite extract, gabapentin, and terbinafine hcl.  MEDICATIONS:  Current Outpatient Medications  Medication Sig Dispense Refill   amLODipine (NORVASC) 10 MG tablet Take 1 tablet (10 mg total) by mouth daily. 90 tablet 1   Ascorbic Acid (VITAMIN C) 1000 MG tablet Take 2,000 mg by mouth daily.     Calcium-Magnesium (CAL-MAG PO) Take 2,000 mg by mouth at bedtime. 1000 mg     Carboxymethylcellul-Glycerin (CLEAR EYES FOR DRY EYES OP) Place 1 drop into both eyes daily.     Cholecalciferol (VITAMIN D PO) Take 2,000 Units by mouth daily.     dexlansoprazole (DEXILANT) 60 MG capsule Take 1 capsule (60 mg total) by mouth daily. 30 capsule 5   fexofenadine (ALLEGRA ALLERGY) 180 MG tablet Take 1 tablet (180 mg total) by mouth daily. 30 tablet 2   hydrochlorothiazide (HYDRODIURIL) 25 MG tablet Take 1 tablet (25 mg total) by mouth 2 (two) times daily. 180 tablet 3   hydrocortisone (ANUSOL-HC) 25 MG suppository Place 1  suppository (25 mg total) rectally 2 (two) times daily as needed for hemorrhoids or anal itching. 12 suppository 3   losartan (COZAAR) 50 MG tablet Take 1 tablet (50 mg total) by mouth daily. 90 tablet 1   Magnesium Salicylate 297 MG TABS Take 325 mg by mouth daily.     milk thistle 175 MG tablet Take 175 mg by mouth daily.     mometasone (NASONEX) 50 MCG/ACT nasal spray Place 2 sprays into the nose daily. 1 each 2   OVER THE COUNTER MEDICATION Take 2,000 mg by mouth daily. Pau-D'arco 1000 mg each     polyethylene glycol (MIRALAX / GLYCOLAX) 17 g packet Take 17 g by mouth daily as needed for mild constipation. 14 each 0   Probiotic Product (PROBIOTIC DAILY PO) Take 1 tablet by mouth daily.     traMADol (ULTRAM) 50 MG tablet SMARTSIG:1-2 Tablet(s) By Mouth 2-3 Times Daily PRN     Vitamin A 2400 MCG (8000 UT) CAPS Take 16,000 Units by mouth daily. 8000 units     No current facility-administered medications for this visit.    REVIEW OF SYSTEMS:   Constitutional: ( - ) fevers, ( - )  chills , ( - ) night sweats Eyes: ( - ) blurriness of vision, ( - ) double vision, ( - ) watery eyes Ears, nose, mouth, throat, and face: ( - ) mucositis, ( - ) sore throat Respiratory: ( - ) cough, ( - ) dyspnea, ( - ) wheezes Cardiovascular: ( - ) palpitation, ( - ) chest discomfort, ( - ) lower extremity swelling Gastrointestinal:  ( - ) nausea, ( - ) heartburn, ( - ) change in bowel habits Skin: ( - ) abnormal skin rashes Lymphatics: ( - ) new lymphadenopathy, ( - ) easy bruising Neurological: ( - ) numbness, ( - ) tingling, ( - ) new weaknesses Behavioral/Psych: ( - ) mood change, ( - ) new changes  All other systems were reviewed with the patient and are negative.  PHYSICAL EXAMINATION: ECOG PERFORMANCE STATUS: 1 - Symptomatic but completely ambulatory  Vitals:   03/12/21 1030  BP: 139/68  Pulse: 83  Resp: 18  Temp: 98.1 F (36.7 C)  SpO2: 96%   Filed Weights   03/12/21 1030  Weight: 147 lb  6.4 oz (66.9 kg)    GENERAL: Well-appearing elderly African-American female, alert, no distress and comfortable SKIN: skin  color, texture, turgor are normal, no rashes or significant lesions EYES: conjunctiva are pink and non-injected, sclera clear NECK: supple, non-tender LYMPH: Enlarged lymph nodes in the left cervical chain.  Otherwise no palpable lymphadenopathy in the cervical, axillary or inguinal lymph nodes LUNGS: clear to auscultation and percussion with normal breathing effort HEART: regular rate & rhythm and no murmurs and no lower extremity edema Musculoskeletal: no cyanosis of digits and no clubbing  PSYCH: alert & oriented x 3, fluent speech NEURO: no focal motor/sensory deficits  LABORATORY DATA:  I have reviewed the data as listed CBC Latest Ref Rng & Units 03/12/2021 02/26/2021 01/29/2021  WBC 4.0 - 10.5 K/uL 5.7 5.9 6.1  Hemoglobin 12.0 - 15.0 g/dL 13.5 13.0 13.7  Hematocrit 36.0 - 46.0 % 40.2 39.2 41.1  Platelets 150 - 400 K/uL 249 248 248    CMP Latest Ref Rng & Units 03/12/2021 02/26/2021 01/29/2021  Glucose 70 - 99 mg/dL 105(H) 98 90  BUN 8 - 23 mg/dL 15 10 12   Creatinine 0.44 - 1.00 mg/dL 0.53 0.57 0.76  Sodium 135 - 145 mmol/L 138 138 141  Potassium 3.5 - 5.1 mmol/L 4.0 3.3(L) 3.7  Chloride 98 - 111 mmol/L 105 101 103  CO2 22 - 32 mmol/L 26 28 27   Calcium 8.9 - 10.3 mg/dL 9.7 9.9 10.6(H)  Total Protein 6.5 - 8.1 g/dL 7.3 7.0 7.8  Total Bilirubin 0.3 - 1.2 mg/dL 0.7 0.5 0.7  Alkaline Phos 38 - 126 U/L 64 60 82  AST 15 - 41 U/L 19 17 20   ALT 0 - 44 U/L 18 15 18      RADIOGRAPHIC STUDIES:  DG Tibia/Fibula Right  Result Date: 02/15/2021 CLINICAL DATA:  Pain right mid posterior calf. History of right fibular fracture in July 2022 EXAM: RIGHT TIBIA AND FIBULA - 2 VIEW COMPARISON:  01/03/2021 FINDINGS: Callus formation around the midshaft right fibula consistent with healing of previously seen fracture. Alignment is normal. Diffuse bone demineralization. No new  acute fracture or dislocation suggested. Soft tissues are unremarkable. IMPRESSION: Healing nondisplaced fracture of the midshaft right fibula. Electronically Signed   By: Lucienne Capers M.D.   On: 02/15/2021 23:46   CT Soft Tissue Neck W Contrast  Result Date: 02/26/2021 CLINICAL DATA:  Left cervical lymphadenopathy EXAM: CT NECK WITH CONTRAST TECHNIQUE: Multidetector CT imaging of the neck was performed using the standard protocol following the bolus administration of intravenous contrast. CONTRAST:  64m OMNIPAQUE IOHEXOL 350 MG/ML SOLN COMPARISON:  01/14/2021 FINDINGS: PHARYNX AND LARYNX: The nasopharynx, oropharynx and larynx are normal. Visible portions of the oral cavity, tongue base and floor of mouth are normal. Normal epiglottis, vallecula and pyriform sinuses. The larynx is normal. No retropharyngeal abscess, effusion or lymphadenopathy. SALIVARY GLANDS: Normal parotid, submandibular and sublingual glands. THYROID: Normal. LYMPH NODES: There are multiple enlarged left-sided lymph nodes. Left level 1B node measuring 16 mm, previously 14 mm. Unchanged 17 mm left level 2A lymph node (image 56). Numerous other mildly enlarged left-sided lymph nodes are unchanged. VASCULAR: Calcific aortic atherosclerosis. LIMITED INTRACRANIAL: Normal VISUALIZED ORBITS: Normal. MASTOIDS AND VISUALIZED PARANASAL SINUSES: No fluid levels or advanced mucosal thickening. No mastoid effusion. SKELETON: No bony spinal canal stenosis. No lytic or blastic lesions. UPPER CHEST: Clear. OTHER: None. IMPRESSION: 1. Mildly increased size of left submandibular lymph node. Otherwise unchanged appearance of multiple enlarged left-sided lymph nodes, measuring up to 17 mm. Findings remain concerning for lymphoma. Histologic sampling should be considered. Aortic Atherosclerosis (ICD10-I70.0). Electronically Signed   By: KLennette Bihari  [place] M.D.   On: 02/26/2021 21:34   US Venous Img Lower Right (DVT Study)  Result Date: 02/16/2021 CLINICAL  DATA:  Edema.  Recent lymphoma diagnosis. EXAM: Right LOWER EXTREMITY VENOUS DOPPLER ULTRASOUND TECHNIQUE: Gray-scale sonography with compression, as well as color and duplex ultrasound, were performed to evaluate the deep venous system(s) from the level of the common femoral vein through the popliteal and proximal calf veins. COMPARISON:  None. FINDINGS: VENOUS Normal compressibility of the common femoral, superficial femoral, and popliteal veins, as well as the visualized calf veins. Visualized portions of profunda femoral vein and great saphenous vein unremarkable. No filling defects to suggest DVT on grayscale or color Doppler imaging. Doppler waveforms show normal direction of venous flow, normal respiratory plasticity and response to augmentation. Limited views of the contralateral common femoral vein are unremarkable. OTHER None. Limitations: none IMPRESSION: Negative. Electronically Signed   By: Ronney Asters M.D.   On: 02/16/2021 15:35    ASSESSMENT & PLAN Deanna [name] 75 y.o. female with medical history significant for marginal zone lymphoma who presents for a follow up visit.  At this time the patient's findings are most consistent with marginal zone lymphoma, stage III/IV.  Based on the GELF criteria the patient has a score of 0, indicating that immediate therapy is not required.  Given that this is an indolent lymphoma that he NCCN guidelines recommend observation as a category 1 recommendation.  We can continue to monitor closely in the event the patient were to have rapid progression or new cytopenias or symptoms we would be happy to start therapy.  I would recommend rituximab based therapy, most likely Bendamustine/rituximab.  The patient did seem somewhat skeptical about observational therapy and is also concerned that when the PET CT scan was ordered, the ordering provider noted the diagnosis was mantle cell lymphoma.  I was clear with her that the diagnosis was marginal zone lymphoma  based on the pathology report.  I spoke with radiology to have this corrected and additionally have reviewed her problem list and assure that marginal zone lymphoma is the only cancer on her list.  #Marginal Zone Lymphoma, Stage III/IV -- PET CT scan confirms Stage III/IV, due to lymph nodes on both sides of diaphragm and apparent bone marrow involvement --based on the GELF Criteria for indolent lymphomas, treatment is not indicated. The patient carries a score 0. As such immediate therapy is not required. --per NCCN guidelines, observation is a category 1 recommendation in her situation. If therapy were to be indicated would proceed with rituximab based therapy, likely Bendamustine/Rituximab.  --repeat CT scan in 6 months time or sooner if patient were to develop symptoms.  --of note, the patient has seen Dr. Claud Kelp at [facility]. We are currently working to retrieve her notes, but per patient she was in agreement with the plan to continue monitoring rather than start therapy  --will have patient return to clinic in 3 months time for continued monitoring.   No orders of the defined types were placed in this encounter.   All questions were answered. The patient knows to call the clinic with any problems, questions or concerns.  A total of more than 30 minutes were spent on this encounter with face-to-face time and non-face-to-face time, including preparing to see the patient, ordering tests and/or medications, counseling the patient and coordination of care as outlined above.   Ledell Peoples, MD Department of Hematology/Oncology [place] at [facility] Phone: [phone] Pager: [phone]  Email: [email or web address]  03/16/2021 6:34 PM

## 2021-03-17 ENCOUNTER — Other Ambulatory Visit: Payer: Self-pay

## 2021-03-17 ENCOUNTER — Encounter: Payer: Self-pay | Admitting: Nurse Practitioner

## 2021-03-17 ENCOUNTER — Ambulatory Visit (INDEPENDENT_AMBULATORY_CARE_PROVIDER_SITE_OTHER): Payer: MEDICARE | Admitting: Nurse Practitioner

## 2021-03-17 VITALS — BP 130/70 | HR 67 | Temp 98.1°F | Ht 61.5 in | Wt 149.6 lb

## 2021-03-17 DIAGNOSIS — R252 Cramp and spasm: Secondary | ICD-10-CM | POA: Diagnosis not present

## 2021-03-17 DIAGNOSIS — I10 Essential (primary) hypertension: Secondary | ICD-10-CM

## 2021-03-17 DIAGNOSIS — G4700 Insomnia, unspecified: Secondary | ICD-10-CM

## 2021-03-17 DIAGNOSIS — Z2821 Immunization not carried out because of patient refusal: Secondary | ICD-10-CM

## 2021-03-17 DIAGNOSIS — I700 Atherosclerosis of aorta: Secondary | ICD-10-CM

## 2021-03-17 DIAGNOSIS — Z5320 Procedure and treatment not carried out because of patient's decision for unspecified reasons: Secondary | ICD-10-CM

## 2021-03-17 DIAGNOSIS — R7309 Other abnormal glucose: Secondary | ICD-10-CM

## 2021-03-17 MED ORDER — ATORVASTATIN CALCIUM 10 MG PO TABS: ORAL_TABLET | ORAL | 1 refills | Status: DC

## 2021-03-17 MED ORDER — ZOSTER VAC RECOMB ADJUVANTED 50 MCG/0.5ML IM SUSR
0.5000 mL | Freq: Once | INTRAMUSCULAR | 1 refills | Status: AC
Start: 2021-03-17 — End: 2021-03-17

## 2021-03-17 MED ORDER — MAGNESIUM 250 MG PO TABS: 1.0000 | ORAL_TABLET | Freq: Every evening | ORAL | 1 refills | Status: DC

## 2021-03-17 MED ORDER — ZOSTER VAC RECOMB ADJUVANTED 50 MCG/0.5ML IM SUSR: 0.5000 mL | Freq: Once | INTRAMUSCULAR | 1 refills | Status: DC

## 2021-03-17 NOTE — Patient Instructions (Signed)

## 2021-03-17 NOTE — Progress Notes (Signed)
I,[name],acting as a Education administrator for [employer], FNP.,have documented all relevant documentation on the behalf of [name], FNP,as directed by  [name], FNP while in the presence of [name], [place].  This visit occurred during the SARS-CoV-2 public health emergency.  Safety protocols were in place, including screening questions prior to the visit, additional usage of staff PPE, and extensive cleaning of exam room while observing appropriate contact time as indicated for disinfecting solutions.  Subjective:     Patient ID: Deanna [name] , female    DOB: 1945/10/12 , 75 y.o.   MRN: [id number]   Chief Complaint  Patient presents with   Hypertension    HPI  Patient is here for abnormal glucose and bp f/u. She continues to see Oncology every 2 months she is currently asymptomatic so they are watching and waiting. She also went to [facility] - confirmed Non-hodgkins lymphoma.  If she begins to have symptoms then would get treatment and will be comanaged with [facility] and [facility] Bertin.    She has a walking cast on her right leg.   She has samples of belsomra (she feels groggy afterwards), trazadone (not effective),  She drinks coffee before 12pm.    Wt Readings from Last 3 Encounters: 03/17/21 : 149 lb 9.6 oz (67.9 kg) 03/12/21 : 147 lb 6.4 oz (66.9 kg) 02/26/21 : 149 lb (67.6 kg)    Hypertension This is a chronic problem. The current episode started more than 1 year ago. The problem is unchanged. The problem is controlled. Pertinent negatives include no chest pain or palpitations. There are no associated agents to hypertension. Risk factors for coronary artery disease include sedentary lifestyle. Past treatments include calcium channel blockers. There are no compliance problems.  There is no history of chronic renal disease.    Past Medical History:  Diagnosis Date   Abnormal glucose 03/06/2020   Age-related osteoporosis without current pathological fracture 11/22/2015    Overview:  Femoral neck  Formatting of this note might be different from the original. 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 Added automatically from request for surgery [id number]   Chronic pain syndrome 04/01/2015   Colon polyps    Essential hypertension 07/20/2000   Overview:  HBP   Female cystocele 02/27/2009   GAD (generalized anxiety disorder) 08/19/2015   GERD (gastroesophageal reflux disease)    History of Bell's palsy 03/21/2015   Hx of adenomatous colonic polyps 12/20/2008   Overview:  Colonoscopy 07/2003 - LMD - polyps removed - recommended repeat in 3 years Colonoscopy 09/2011 - Dr. Marian Sorrow - hyperplastic polyp(s) were removed - repeat in 5 years   Hypertension    Incomplete uterovaginal prolapse 03/04/2004   Insomnia    Left bundle-branch block 05/20/2006   Lumbar radiculopathy 01/09/2016   Formatting of this note might be different from the original. Added automatically from request for surgery [id number]   LVH (left ventricular 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 05/23/2015   Non-rheumatic mitral regurgitation 03/21/2015   Osteopenia determined by x-ray 11/22/2015   Formatting of this note might be different from the original. Femoral neck   Other intervertebral disc degeneration, lumbar region 01/29/2015  Primary osteoarthritis, right hand 02/28/2002   Overview:  OA HANDS, ESP THUMBS   Radial tunnel syndrome, right 11/04/2016   Radiculopathy, lumbar region 11/22/2014   Overview:  Added automatically from request for surgery [id number]   Rhinitis, allergic 02/24/2002   Overview:  ALLERGY SYMPTOMS - states was seen by allergist in past and informed allergic to dust    Sacroiliitis ([place]) 08/02/2019   Screening for colon cancer 04/17/2005   Formatting of this note might be different from the original. 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 TRIGGER THUMBS - started spring 2003. Injected 02/28/02     Family History  Problem Relation Age of Onset   Arthritis Mother    Diabetes Mother    Pancreatic cancer Mother 22   Early death Father    Leukemia Father    Leukemia Sister    Early death Sister    Diabetes Sister      Current Outpatient Medications:    atorvastatin (LIPITOR) 10 MG tablet, Take 1 tab by mouth MWF, Disp: 45 tablet, Rfl: 1   Magnesium 250 MG TABS, Take 1 tablet (250 mg total) by mouth every evening. Take with evening meal, Disp: 90 tablet, Rfl: 1   amLODipine (NORVASC) 10 MG tablet, Take 1 tablet (10 mg total) by mouth daily., Disp: 90 tablet, Rfl: 1   Ascorbic Acid (VITAMIN C) 1000 MG tablet, Take 2,000 mg by mouth daily., Disp: , Rfl:    Calcium-Magnesium (CAL-MAG PO), Take 2,000 mg by mouth at bedtime. 1000 mg, Disp: , Rfl:    Carboxymethylcellul-Glycerin (CLEAR EYES FOR DRY EYES OP), Place 1 drop into both eyes daily., Disp: , Rfl:    Cholecalciferol (VITAMIN D PO), Take 2,000 Units by mouth daily., Disp: , Rfl:    dexlansoprazole (DEXILANT) 60 MG capsule, Take 1 capsule (60 mg total) by mouth daily., Disp: 30 capsule, Rfl: 5   fexofenadine (ALLEGRA ALLERGY) 180 MG tablet, Take 1 tablet (180 mg total) by mouth daily., Disp: 30 tablet, Rfl: 2   hydrochlorothiazide (HYDRODIURIL) 25 MG tablet, Take 1 tablet (25 mg total) by mouth 2 (two) times daily., Disp: 180 tablet, Rfl: 3    hydrocortisone (ANUSOL-HC) 25 MG suppository, Place 1 suppository (25 mg total) rectally 2 (two) times daily as needed for hemorrhoids or anal itching., Disp: 12 suppository, Rfl: 3   losartan (COZAAR) 50 MG tablet, Take 1 tablet (50 mg total) by mouth daily., Disp: 90 tablet, Rfl: 1   milk thistle 175 MG tablet, Take 175 mg by mouth daily., Disp: , Rfl:    mometasone (NASONEX) 50 MCG/ACT nasal spray, Place 2 sprays into the nose daily., Disp: 1 each, Rfl: 2   OVER THE COUNTER MEDICATION, Take 2,000 mg by mouth daily. Pau-D'arco 1000 mg each, Disp: , Rfl:    polyethylene glycol (MIRALAX / GLYCOLAX) 17 g packet, Take 17 g by mouth daily as needed for mild constipation., Disp: 14 each, Rfl: 0   Probiotic Product (PROBIOTIC DAILY PO), Take 1 tablet by mouth daily., Disp: , Rfl:    traMADol (ULTRAM) 50 MG tablet, SMARTSIG:1-2 Tablet(s) By Mouth 2-3 Times Daily PRN, Disp: , Rfl:  Vitamin A 2400 MCG (8000 UT) CAPS, Take 16,000 Units by mouth daily. 8000 units, Disp: , Rfl:    Allergies  Allergen Reactions   Gluten Meal Diarrhea and Other (See Comments)    Severe diarrhea per member    Codeine     Chest pains, acid reflux    Dust Mite Extract     Upper Respiratory issues    Gabapentin     Eyes went numb and blurry   Terbinafine Hcl Rash     Review of Systems  Constitutional: Negative.   Respiratory: Negative.    Cardiovascular: Negative.  Negative for chest pain and palpitations.  Gastrointestinal: Negative.   Neurological: Negative.   Psychiatric/Behavioral:  Positive for sleep disturbance.     Today's Vitals   03/17/21 1036  BP: 130/70  Pulse: 67  Temp: 98.1 F (36.7 C)  TempSrc: Oral  Weight: 149 lb 9.6 oz (67.9 kg)  Height: 5' 1.5" (1.562 m)   Body mass index is 27.81 kg/m.  Wt Readings from Last 3 Encounters:  03/17/21 149 lb 9.6 oz (67.9 kg)  03/12/21 147 lb 6.4 oz (66.9 kg)  02/26/21 149 lb (67.6 kg)    Objective:  Physical Exam Vitals reviewed.  Constitutional:       General: She is not in acute distress.    Appearance: Normal appearance.  Cardiovascular:     Rate and Rhythm: Normal rate and regular rhythm.     Pulses: Normal pulses.     Heart sounds: Normal heart sounds. No murmur heard. Pulmonary:     Effort: Pulmonary effort is normal. No respiratory distress.     Breath sounds: Normal breath sounds. No wheezing.  Neurological:     General: No focal deficit present.     Mental Status: She is alert and oriented to [place], place, and time.     Cranial Nerves: No cranial nerve deficit.     Motor: No weakness.  Psychiatric:        Mood and Affect: Mood normal.        Behavior: Behavior normal.        Thought Content: Thought content normal.        Judgment: Judgment normal.        Assessment And Plan:     1. Essential hypertension Comments: Blood pressure is under good control Continue current medications  2. Abnormal glucose Comments: HgbA1c was slightly elevated at the last visit and will recheck levels at next visit Continue with healthy diet low in sugar and starches  3. Leg cramps Comments: She has had venous doppler done with normal findings. Encouraged to take magnesium supplement and stay well hydrated with water.  - Magnesium 250 MG TABS; Take 1 tablet (250 mg total) by mouth every evening. Take with evening meal  Dispense: 90 tablet; Refill: 1  4. Atherosclerosis of aorta [facility]) Comments: Was seen on her CT scan on 02/26/2021, she is willing to take atorvastatin three times a day. Discussed side effects to include muscle cramps - atorvastatin (LIPITOR) 10 MG tablet; Take 1 tab by mouth MWF  Dispense: 45 tablet; Refill: 1  5. Influenza vaccination declined Patient declined influenza vaccination at this time. Patient is aware that influenza vaccine prevents illness in 70% of healthy people, and reduces hospitalizations to 30-70% in elderly. This vaccine is recommended annually. Pt is willing to accept risk associated with  refusing vaccination.  6. Insomnia, unspecified type Comments: Offered Gene Sight testing but declined. Belsomra was ineffective per patient  She has also declined sleep study in the past.      Patient was given opportunity to ask questions. Patient verbalized understanding of the plan and was able to repeat key elements of the plan. All questions were answered to their satisfaction.  [name], FNP   I, [name], FNP, have reviewed all documentation for this visit. The documentation on 03/18/21 for the exam, diagnosis, procedures, and orders are all accurate and complete.   IF YOU HAVE BEEN REFERRED TO A SPECIALIST, IT MAY TAKE 1-2 WEEKS TO SCHEDULE/PROCESS THE REFERRAL. IF YOU HAVE NOT HEARD FROM US/SPECIALIST IN TWO WEEKS, PLEASE GIVE [place] A CALL AT [phone].   THE PATIENT IS ENCOURAGED TO PRACTICE SOCIAL DISTANCING DUE TO THE COVID-19 PANDEMIC.

## 2021-03-19 ENCOUNTER — Other Ambulatory Visit: Payer: Self-pay | Admitting: Nurse Practitioner

## 2021-03-19 DIAGNOSIS — J301 Allergic rhinitis due to pollen: Secondary | ICD-10-CM

## 2021-03-26 ENCOUNTER — Inpatient Hospital Stay: Payer: MEDICARE

## 2021-04-01 DIAGNOSIS — T7840XA Allergy, unspecified, initial encounter: Secondary | ICD-10-CM | POA: Insufficient documentation

## 2021-04-02 ENCOUNTER — Telehealth: Payer: Self-pay | Admitting: *Deleted

## 2021-04-02 NOTE — Telephone Encounter (Signed)
TCT patient regarding scheduling a follow up appt with Dr. Lorenso Courier. She is agreeable to come in tomorrow 04/03/21 for labs @ 7:45 am and Dr. Lorenso Courier @ 8:20 am

## 2021-04-03 ENCOUNTER — Ambulatory Visit
Admission: RE | Admit: 2021-04-03 | Discharge: 2021-04-03 | Disposition: A | Discharge status: Home / Self Care | Payer: Self-pay | Source: Ambulatory Visit | Attending: Hematology and Oncology | Admitting: Hematology and Oncology

## 2021-04-03 ENCOUNTER — Other Ambulatory Visit: Payer: Self-pay | Admitting: *Deleted

## 2021-04-03 ENCOUNTER — Other Ambulatory Visit: Payer: Self-pay | Admitting: Hematology and Oncology

## 2021-04-03 ENCOUNTER — Inpatient Hospital Stay (HOSPITAL_BASED_OUTPATIENT_CLINIC_OR_DEPARTMENT_OTHER): Payer: MEDICARE | Admitting: Hematology and Oncology

## 2021-04-03 ENCOUNTER — Other Ambulatory Visit: Payer: Self-pay

## 2021-04-03 ENCOUNTER — Inpatient Hospital Stay: Payer: MEDICARE | Attending: Hematology

## 2021-04-03 ENCOUNTER — Other Ambulatory Visit (HOSPITAL_COMMUNITY): Payer: Self-pay | Admitting: Hematology and Oncology

## 2021-04-03 VITALS — BP 134/68 | HR 83 | Temp 98.0°F | Resp 17 | Wt 146.7 lb

## 2021-04-03 DIAGNOSIS — Z79899 Other long term (current) drug therapy: Secondary | ICD-10-CM | POA: Insufficient documentation

## 2021-04-03 DIAGNOSIS — S82402A Unspecified fracture of shaft of left fibula, initial encounter for closed fracture: Secondary | ICD-10-CM | POA: Insufficient documentation

## 2021-04-03 DIAGNOSIS — M810 Age-related osteoporosis without current pathological fracture: Secondary | ICD-10-CM | POA: Insufficient documentation

## 2021-04-03 DIAGNOSIS — C8580 Other specified types of non-Hodgkin lymphoma, unspecified site: Secondary | ICD-10-CM

## 2021-04-03 DIAGNOSIS — G5603 Carpal tunnel syndrome, bilateral upper limbs: Secondary | ICD-10-CM | POA: Diagnosis not present

## 2021-04-03 DIAGNOSIS — I340 Nonrheumatic mitral (valve) insufficiency: Secondary | ICD-10-CM | POA: Diagnosis not present

## 2021-04-03 DIAGNOSIS — C8588 Other specified types of non-Hodgkin lymphoma, lymph nodes of multiple sites: Secondary | ICD-10-CM | POA: Insufficient documentation

## 2021-04-03 DIAGNOSIS — K219 Gastro-esophageal reflux disease without esophagitis: Secondary | ICD-10-CM | POA: Diagnosis not present

## 2021-04-03 DIAGNOSIS — I10 Essential (primary) hypertension: Secondary | ICD-10-CM | POA: Diagnosis not present

## 2021-04-03 DIAGNOSIS — C8581 Other specified types of non-Hodgkin lymphoma, lymph nodes of head, face, and neck: Secondary | ICD-10-CM | POA: Diagnosis not present

## 2021-04-03 DIAGNOSIS — R590 Localized enlarged lymph nodes: Secondary | ICD-10-CM | POA: Diagnosis not present

## 2021-04-03 DIAGNOSIS — F411 Generalized anxiety disorder: Secondary | ICD-10-CM | POA: Insufficient documentation

## 2021-04-03 DIAGNOSIS — Z86010 Personal history of colonic polyps: Secondary | ICD-10-CM | POA: Diagnosis not present

## 2021-04-03 DIAGNOSIS — M19041 Primary osteoarthritis, right hand: Secondary | ICD-10-CM | POA: Insufficient documentation

## 2021-04-03 LAB — CBC WITH DIFFERENTIAL (CANCER CENTER ONLY)
Abs Immature Granulocytes: 0.02 10*3/uL (ref 0.00–0.07)
Basophils Absolute: 0.1 10*3/uL (ref 0.0–0.1)
Basophils Relative: 1 %
Eosinophils Absolute: 0.2 10*3/uL (ref 0.0–0.5)
Eosinophils Relative: 4 %
HCT: 40.3 % (ref 36.0–46.0)
Hemoglobin: 13.6 g/dL (ref 12.0–15.0)
Immature Granulocytes: 0 %
Lymphocytes Relative: 23 %
Lymphs Abs: 1.4 10*3/uL (ref 0.7–4.0)
MCH: 27.3 pg (ref 26.0–34.0)
MCHC: 33.7 g/dL (ref 30.0–36.0)
MCV: 80.8 fL (ref 80.0–100.0)
Monocytes Absolute: 0.7 10*3/uL (ref 0.1–1.0)
Monocytes Relative: 11 %
Neutro Abs: 3.8 10*3/uL (ref 1.7–7.7)
Neutrophils Relative %: 61 %
Platelet Count: 260 10*3/uL (ref 150–400)
RBC: 4.99 MIL/uL (ref 3.87–5.11)
RDW: 13.6 % (ref 11.5–15.5)
WBC Count: 6.2 10*3/uL (ref 4.0–10.5)
nRBC: 0 % (ref 0.0–0.2)

## 2021-04-03 LAB — CMP (CANCER CENTER ONLY)
ALT: 16 U/L (ref 0–44)
AST: 18 U/L (ref 15–41)
Albumin: 4.2 g/dL (ref 3.5–5.0)
Alkaline Phosphatase: 78 U/L (ref 38–126)
Anion gap: 10 (ref 5–15)
BUN: 16 mg/dL (ref 8–23)
CO2: 26 mmol/L (ref 22–32)
Calcium: 10 mg/dL (ref 8.9–10.3)
Chloride: 105 mmol/L (ref 98–111)
Creatinine: 0.69 mg/dL (ref 0.44–1.00)
GFR, Estimated: >60 mL/min (ref >60)
Glucose, Bld: 114 mg/dL — ABNORMAL HIGH (ref 70–99)
Potassium: 3.8 mmol/L (ref 3.5–5.1)
Sodium: 141 mmol/L (ref 135–145)
Total Bilirubin: 0.4 mg/dL (ref 0.3–1.2)
Total Protein: 7.2 g/dL (ref 6.5–8.1)

## 2021-04-03 LAB — LACTATE DEHYDROGENASE: LDH: 192 U/L (ref 98–192)

## 2021-04-04 ENCOUNTER — Ambulatory Visit (INDEPENDENT_AMBULATORY_CARE_PROVIDER_SITE_OTHER): Payer: MEDICARE | Admitting: Cardiology

## 2021-04-04 ENCOUNTER — Encounter: Payer: Self-pay | Admitting: Cardiology

## 2021-04-04 ENCOUNTER — Telehealth: Payer: Self-pay | Admitting: *Deleted

## 2021-04-04 VITALS — BP 122/60 | HR 94 | Ht 63.0 in | Wt 146.1 lb

## 2021-04-04 DIAGNOSIS — E782 Mixed hyperlipidemia: Secondary | ICD-10-CM

## 2021-04-04 DIAGNOSIS — I447 Left bundle-branch block, unspecified: Secondary | ICD-10-CM | POA: Diagnosis not present

## 2021-04-04 DIAGNOSIS — I340 Nonrheumatic mitral (valve) insufficiency: Secondary | ICD-10-CM

## 2021-04-04 DIAGNOSIS — I700 Atherosclerosis of aorta: Secondary | ICD-10-CM

## 2021-04-04 DIAGNOSIS — I10 Essential (primary) hypertension: Secondary | ICD-10-CM | POA: Diagnosis not present

## 2021-04-04 HISTORY — DX: Mixed hyperlipidemia: E78.2

## 2021-04-04 HISTORY — DX: Nonrheumatic mitral (valve) insufficiency: I34.0

## 2021-04-04 HISTORY — DX: Atherosclerosis of aorta: I70.0

## 2021-04-04 NOTE — Progress Notes (Signed)
Cardiology Office Note:    Date:  04/04/2021   ID:  Deanna [name], DOB 02/02/1946, MRN [id number]  PCP:  [name], FNP  Cardiologist:  Jenean Lindau, MD   Referring MD: [name], FNP    ASSESSMENT:    1. Essential hypertension   2. Left bundle-branch block   3. Mitral valve insufficiency, unspecified etiology   4. Aortic atherosclerosis ([place])    PLAN:    In order of problems listed above:  Aortic atherosclerosis: I discussed findings with the patient at length and secondary prevention stressed.  Importance of compliance with diet medication stressed and she vocalized understanding. Essential hypertension: Blood pressure stable and diet was emphasized.  Lifestyle modification urged.  Salt intake issues were discussed. Mixed dyslipidemia: On lipid-lowering therapy and tolerating well.  This is followed by primary care. Mitral regurgitation: Stable at this time.  We will continue to monitor. Patient will be seen in follow-up appointment in 9 months or earlier if the patient has any concerns    Medication Adjustments/Labs and Tests Ordered: Current medicines are reviewed at length with the patient today.  Concerns regarding medicines are outlined above.  No orders of the defined types were placed in this encounter.  No orders of the defined types were placed in this encounter.    No chief complaint on file.    History of Present Illness:    Deanna [name] is a 75 y.o. female.  Patient has past medical history of aortic atherosclerosis, essential hypertension and mixed dyslipidemia.  She denies any problems at this time and takes care of activities of daily living.  No chest pain orthopnea or PND.  She was recently found to have aortic atherosclerosis and placed on lipid-lowering medications appropriately by primary care.  She leads a sedentary lifestyle.  She has had a orthopedic issue with her right foot and it is getting better subsequent to which she starts  exercising on a regular basis.  No chest pain orthopnea or PND.  She has history of left bundle branch block and mild mitral regurgitation.  At the time of my evaluation, the patient is alert awake oriented and in no distress.  Past Medical History:  Diagnosis Date   Abnormal glucose 03/06/2020   Age-related osteoporosis without current pathological fracture 11/22/2015   Overview:  Femoral neck  Formatting of this note might be different from the original. 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 Added automatically from request for surgery [id number]   Chronic pain syndrome 04/01/2015   Colon polyps    Essential hypertension 07/20/2000   Overview:  HBP   Female cystocele 02/27/2009   GAD (generalized anxiety disorder) 08/19/2015   GERD (gastroesophageal reflux disease)    History of Bell's palsy 03/21/2015   Hx of adenomatous colonic polyps 12/20/2008   Overview:  Colonoscopy 07/2003 - LMD - polyps removed - recommended repeat in 3 years Colonoscopy 09/2011 - Dr. Marian Sorrow - hyperplastic polyp(s) were removed - repeat in 5 years   Hypertension    Incomplete uterovaginal prolapse 03/04/2004   Insomnia    Left bundle-branch block 05/20/2006   Lumbar radiculopathy  01/09/2016   Formatting of this note might be different from the original. 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 Femoral neck    Other intervertebral disc degeneration, lumbar region 01/29/2015   Pre-operative cardiovascular examination 09/03/2020   Primary osteoarthritis, right hand 02/28/2002   Overview:  OA HANDS, ESP THUMBS   Radial tunnel syndrome, right 11/04/2016   Radiculopathy, lumbar region 11/22/2014   Overview:  Added automatically from request for surgery [id number]   Rhinitis, allergic 02/24/2002   Overview:  ALLERGY SYMPTOMS - states was seen by allergist in past and informed allergic to dust   S/P left total hip arthroplasty 09/17/2020   Sacroiliitis ([place]) 08/02/2019   Screening for colon cancer 04/17/2005   Formatting of this note might be different from the original. 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 TRIGGER THUMBS - started spring 2003. Injected 02/28/02   Unspecified asthma, uncomplicated 7/74/1287    Past Surgical History:  Procedure Laterality Date   ABDOMINAL HYSTERECTOMY  2010   [place]   SPINAL FUSION  01/2016   TONSILLECTOMY AND ADENOIDECTOMY  1956   TOTAL HIP ARTHROPLASTY Left 09/17/2020   Procedure: TOTAL HIP ARTHROPLASTY ANTERIOR APPROACH;  Surgeon: Paralee Cancel, MD;  Location: [facility];  Service: Orthopedics;  Laterality: Left;  70 mins    Current Medications: Current Meds  Medication Sig   amLODipine (NORVASC) 10 MG tablet Take 1 tablet (10 mg total) by mouth daily.   Ascorbic Acid (VITAMIN C) 1000 MG tablet Take 2,000 mg by mouth daily.   atorvastatin (LIPITOR) 10 MG tablet Take 1 tab by mouth MWF   Calcium-Magnesium (CAL-MAG PO) Take 2,000 mg by mouth at bedtime. 1000 mg    Carboxymethylcellul-Glycerin (CLEAR EYES FOR DRY EYES OP) Place 1 drop into both eyes daily.   Cholecalciferol (VITAMIN D PO) Take 2,000 Units by mouth daily.   dexlansoprazole (DEXILANT) 60 MG capsule Take 1 capsule (60 mg total) by mouth daily.   fexofenadine (ALLEGRA) 180 MG tablet TAKE 1 TABLET BY MOUTH EVERY DAY   hydrochlorothiazide (HYDRODIURIL) 25 MG tablet Take 1 tablet (25 mg total) by mouth 2 (two) times daily.   hydrocortisone (ANUSOL-HC) 25 MG suppository Place 1 suppository (25 mg total) rectally 2 (two) times daily as needed for hemorrhoids or anal itching.   losartan (COZAAR) 50 MG tablet Take 1 tablet (50 mg total) by mouth daily.   Magnesium 250 MG TABS Take 1 tablet (250 mg total) by mouth every evening. Take with evening meal   milk thistle 175 MG tablet Take 175 mg by mouth daily.   mometasone (NASONEX) 50 MCG/ACT nasal spray Place 2 sprays into the nose daily.   OVER THE COUNTER MEDICATION Take 2,000 mg by mouth daily. Pau-D'arco 1000 mg each   polyethylene glycol (MIRALAX / GLYCOLAX) 17 g packet Take 17 g by mouth daily as needed for mild  constipation.   Probiotic Product (PROBIOTIC DAILY PO) Take 1 tablet by mouth daily.   traMADol (ULTRAM) 50 MG tablet SMARTSIG:1-2 Tablet(s) By Mouth 2-3 Times Daily PRN   Vitamin A 2400 MCG (8000 UT) CAPS Take 16,000 Units by mouth daily. 8000 units     Allergies:   Codeine, Gluten meal, Dust mite extract, Gabapentin, and Terbinafine hcl   Social History   Socioeconomic History   Marital status: Divorced    Spouse name: Not on file   Number of children: 2   Years of education: 2 years college   Highest education level: Not on file  Occupational History   Occupation: Retired  Tobacco Use   Smoking status: Former    Packs/day: 0.50    Years: 30.00    Pack years: 15.00    Types: Cigarettes    Quit date: 12/31/1997    Years since quitting: 23.2   Smokeless tobacco: Never   Tobacco comments:    Quit in 1999  Vaping Use    Vaping Use: Never used  Substance and Sexual Activity   Alcohol use: Not Currently   Drug use: No   Sexual activity: Not Currently    Birth control/protection: Post-menopausal  Other Topics Concern   Not on file  Social History Narrative   Lives alone.   Right-handed.   One cup caffeine per day.   Social Determinants of Health   Financial Resource Strain: Low Risk    Difficulty of Paying Living Expenses: Not hard at all  Food Insecurity: No Food Insecurity   Worried About Charity fundraiser in the Last Year: Never true   [place] in the Last Year: Never true  Transportation Needs: No Transportation Needs   Lack of Transportation (Medical): No   Lack of Transportation (Non-Medical): No  Physical Activity: Inactive   Days of Exercise per Week: 0 days   Minutes of Exercise per Session: 0 min  Stress: Not on file  Social Connections: Not on file     Family History: The patient's family history includes Arthritis in her mother; Diabetes in her mother and sister; Early death in her father and sister; Leukemia in her father and sister; Pancreatic cancer (age of onset: 75) in her mother.  ROS:   Please see the history of present illness.    All other systems reviewed and are negative.  EKGs/Labs/Other Studies Reviewed:    The following studies were reviewed today: I discussed my findings with the patient at length.   Recent Labs: 04/03/2021: ALT 16; BUN 16; Creatinine 0.69; Hemoglobin 13.6; Platelet Count 260; Potassium 3.8; Sodium 141  Recent Lipid Panel No results found for: CHOL, TRIG, HDL, CHOLHDL, VLDL, LDLCALC, LDLDIRECT  Physical Exam:    VS:  BP 122/60   Pulse 94   Ht 5' 3"  (1.6 m)   Wt 146 lb 1.3 oz (66.3 kg)   SpO2 96%   BMI 25.88 kg/m     Wt Readings from Last 3 Encounters:  04/04/21 146 lb 1.3 oz (66.3 kg)  04/03/21 146 lb 11.2 oz (66.5 kg)  03/17/21 149 lb 9.6 oz (67.9 kg)     GEN: Patient is in no acute distress HEENT: Normal NECK: No  JVD; No carotid bruits LYMPHATICS: No lymphadenopathy CARDIAC: Hear sounds regular, 2/6 systolic murmur at the apex. RESPIRATORY:  Clear to auscultation without rales, wheezing or rhonchi  ABDOMEN: Soft, non-tender, non-distended MUSCULOSKELETAL:  No edema; No deformity  SKIN: Warm and dry NEUROLOGIC:  Alert and oriented x 3 PSYCHIATRIC:  Normal affect   Signed, Jenean Lindau, MD  04/04/2021 11:32 AM    [place]

## 2021-04-04 NOTE — Telephone Encounter (Signed)
Received vm message from pt asking if Dr. Lorenso Courier has received the radiologist review of her recent MRI of her lower extremities. Call made to [facility] and spoke to someone in their reading room. He states the files are being ,merged, with assist from IT and then the radiologist can read them. He states someone will post the results and call back to this office. As it is almost 4pm , it may not happen until Monday or over the weekend,  Left vm message for patient with the above information.

## 2021-04-04 NOTE — Patient Instructions (Signed)
Medication Instructions:  Your physician recommends that you continue on your current medications as directed. Please refer to the Current Medication list given to you today.  *If you need a refill on your cardiac medications before your next appointment, please call your pharmacy*   Lab Work: None ordered If you have labs (blood work) drawn today and your tests are completely normal, you will receive your results only by: MyChart Message (if you have MyChart) OR A paper copy in the mail If you have any lab test that is abnormal or we need to change your treatment, we will call you to review the results.   Testing/Procedures: None ordered   Follow-Up: At [facility], you and your health needs are our priority.  As part of our continuing mission to provide you with exceptional heart care, we have created designated Provider Care Teams.  These Care Teams include your primary Cardiologist (physician) and Advanced Practice Providers (APPs -  Physician Assistants and Nurse Practitioners) who all work together to provide you with the care you need, when you need it.  We recommend signing up for the patient portal called "MyChart".  Sign up information is provided on this After Visit Summary.  MyChart is used to connect with patients for Virtual Visits (Telemedicine).  Patients are able to view lab/test results, encounter notes, upcoming appointments, etc.  Non-urgent messages can be sent to your provider as well.   To learn more about what you can do with MyChart, go to [email or web address].    Your next appointment:   9 month(s)  The format for your next appointment:   In [place]  Provider:   Rajan Revankar, MD   Other Instructions NA  

## 2021-04-06 NOTE — Progress Notes (Signed)
[place] [phone]   [phone]  PROGRESS NOTE  Patient Care Team: [name], FNP as PCP - General (General Practice)  Hematological/Oncological History # Marginal Zone Lymphoma, Stage III/IV 01/14/2021: presented to the ED with cervical lymphadenopathy 01/15/2021: establish care with Dr. Burr Medico at the [facility] 01/20/2021: US guided lymph node biopsy, findings consistent with a marginal zone lymphoma 01/29/2021: transfer care to Dr. Lorenso Courier  02/07/2021: PET CT scan confirms nodal disease within the neck, chest, and pelvis. Most significant within the cervical regions. [place] 5 03/28/2021: MRI showed fracture of the mid fibular shaft with callus formation and partial healing. Concern for possible pathological fracture.   Interval History:  Deanna [name] 75 y.o. female with medical history significant for marginal zone lymphoma who presents for an urgent follow up visit. The patient's last visit was on 03/12/2021. In the interim since the last visit she has sought a third opinion at [facility] with Dr. Jolayne Haines and unfortunately has developed a fracture of her right fibula.  On exam today Deanna [name] is accompanied by her daughter. Deanna [name] notes she unfortunately developed a fracture of her right fibula in the interim since her last visit.  She is deeply upset that she did not undergo a head to toe PET CT scan during her last visit as she received a skull base to mid thigh as a standard.  She notes that her pain is currently under good control and she is currently in a brace for her fracture.  She notes that she has been taking tramadol and Advil which has been helping with the pain.  She continues to deny any fevers, chills, sweats, nausea, vomiting or diarrhea.  A full 10 point ROS is listed below.  Lymph nodes in her neck and remained stable size.  The bulk of our discussion focused on the plan moving forward given this new fracture  and the radiographic concern for possible pathologic fracture.  MEDICAL HISTORY:  Past Medical History:  Diagnosis Date   Abnormal glucose 03/06/2020   Age-related osteoporosis without current pathological fracture 11/22/2015   Overview:  Femoral neck  Formatting of this note might be different from the original. 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 Added automatically from request for surgery [id number]   Chronic pain syndrome 04/01/2015   Colon polyps    Essential hypertension 07/20/2000   Overview:  HBP   Female cystocele 02/27/2009   GAD (generalized anxiety disorder) 08/19/2015   GERD (gastroesophageal reflux disease)    History of Bell's palsy 03/21/2015   Hx of adenomatous colonic polyps 12/20/2008   Overview:  Colonoscopy 07/2003 - [facility] - polyps removed - recommended repeat in 3 years Colonoscopy 09/2011 - Dr. Marian Sorrow - hyperplastic polyp(s) were removed - repeat in 5 years   Hypertension    Incomplete uterovaginal prolapse 03/04/2004   Insomnia    Left bundle-branch block 05/20/2006   Lumbar radiculopathy 01/09/2016   Formatting of this note might be different from the original. Added automatically from request for surgery [id number]   LVH (left ventricular 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 05/23/2015   Marginal  zone lymphoma ([place]) 02/18/2021   Non-rheumatic mitral regurgitation 03/21/2015   Osteopenia determined by x-ray 11/22/2015   Formatting of this note might be different from the original. Femoral neck   Other intervertebral disc degeneration, lumbar region 01/29/2015   Pre-operative cardiovascular examination 09/03/2020   Primary osteoarthritis,  right hand 02/28/2002   Overview:  OA HANDS, ESP THUMBS   Radial tunnel syndrome, right 11/04/2016   Radiculopathy, lumbar region 11/22/2014   Overview:  Added automatically from request for surgery [id number]   Rhinitis, allergic 02/24/2002   Overview:  ALLERGY SYMPTOMS - states was seen by allergist in past and informed allergic to dust   S/P left total hip arthroplasty 09/17/2020   Sacroiliitis ([place]) 08/02/2019   Screening for colon cancer 04/17/2005   Formatting of this note might be different from the original. 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 TRIGGER THUMBS - started spring 2003. Injected 02/28/02   Unspecified asthma, uncomplicated 8/91/6945    SURGICAL HISTORY: Past Surgical History:  Procedure Laterality Date   ABDOMINAL HYSTERECTOMY  2010   [place]   SPINAL FUSION  01/2016   TONSILLECTOMY AND ADENOIDECTOMY  1956   TOTAL HIP ARTHROPLASTY Left 09/17/2020   Procedure: TOTAL HIP ARTHROPLASTY ANTERIOR APPROACH;  Surgeon: Paralee Cancel, MD;  Location: [facility];  Service: Orthopedics;  Laterality: Left;  70 mins    SOCIAL HISTORY: Social History   Socioeconomic History   Marital status: Divorced    Spouse name: Not on file   Number of children: 2   Years of education: 2 years college   Highest education level: Not on file  Occupational History   Occupation: Retired  Tobacco Use   Smoking status: Former    Packs/day: 0.50    Years: 30.00    Pack years: 15.00    Types: Cigarettes    Quit date: 12/31/1997    Years since quitting: 23.2   Smokeless tobacco: Never   Tobacco comments:     Quit in 1999  Vaping Use   Vaping Use: Never used  Substance and Sexual Activity   Alcohol use: Not Currently   Drug use: No   Sexual activity: Not Currently    Birth control/protection: Post-menopausal  Other Topics Concern   Not on file  Social History Narrative   Lives alone.   Right-handed.   One cup caffeine per day.   Social Determinants of Health   Financial Resource Strain: Low Risk    Difficulty of Paying Living Expenses: Not hard at all  Food Insecurity: No Food Insecurity   Worried About Charity fundraiser in the Last Year: Never true   [place] in the Last Year: Never true  Transportation Needs: No Transportation Needs   Lack of Transportation (Medical): No   Lack of Transportation (Non-Medical): No  Physical Activity: Inactive   Days of Exercise per Week: 0 days   Minutes of Exercise per Session: 0 min  Stress: Not on file  Social Connections: Not on file  Intimate Partner Violence: Not on file    FAMILY HISTORY: Family History  Problem Relation Age  of Onset   Arthritis Mother    Diabetes Mother    Pancreatic cancer Mother 37   Early death Father    Leukemia Father    Leukemia Sister    Early death Sister    Diabetes Sister     ALLERGIES:  is allergic to codeine, gluten meal, dust mite extract, gabapentin, and terbinafine hcl.  MEDICATIONS:  Current Outpatient Medications  Medication Sig Dispense Refill   amLODipine (NORVASC) 10 MG tablet Take 1 tablet (10 mg total) by mouth daily. 90 tablet 1   Ascorbic Acid (VITAMIN C) 1000 MG tablet Take 2,000 mg by mouth daily.     atorvastatin (LIPITOR) 10 MG tablet Take 1 tab by mouth MWF 45 tablet 1   Calcium-Magnesium (CAL-MAG PO) Take 2,000 mg by mouth at bedtime. 1000 mg     Carboxymethylcellul-Glycerin (CLEAR EYES FOR DRY EYES OP) Place 1 drop into both eyes daily.     Cholecalciferol (VITAMIN D PO) Take 2,000 Units by mouth daily.     dexlansoprazole (DEXILANT) 60 MG capsule Take 1 capsule (60  mg total) by mouth daily. 30 capsule 5   fexofenadine (ALLEGRA) 180 MG tablet TAKE 1 TABLET BY MOUTH EVERY DAY 30 tablet 2   hydrochlorothiazide (HYDRODIURIL) 25 MG tablet Take 1 tablet (25 mg total) by mouth 2 (two) times daily. 180 tablet 3   hydrocortisone (ANUSOL-HC) 25 MG suppository Place 1 suppository (25 mg total) rectally 2 (two) times daily as needed for hemorrhoids or anal itching. 12 suppository 3   losartan (COZAAR) 50 MG tablet Take 1 tablet (50 mg total) by mouth daily. 90 tablet 1   Magnesium 250 MG TABS Take 1 tablet (250 mg total) by mouth every evening. Take with evening meal 90 tablet 1   milk thistle 175 MG tablet Take 175 mg by mouth daily.     mometasone (NASONEX) 50 MCG/ACT nasal spray Place 2 sprays into the nose daily. 1 each 2   OVER THE COUNTER MEDICATION Take 2,000 mg by mouth daily. Pau-D'arco 1000 mg each     polyethylene glycol (MIRALAX / GLYCOLAX) 17 g packet Take 17 g by mouth daily as needed for mild constipation. 14 each 0   Probiotic Product (PROBIOTIC DAILY PO) Take 1 tablet by mouth daily.     traMADol (ULTRAM) 50 MG tablet SMARTSIG:1-2 Tablet(s) By Mouth 2-3 Times Daily PRN     Vitamin A 2400 MCG (8000 UT) CAPS Take 16,000 Units by mouth daily. 8000 units     No current facility-administered medications for this visit.    REVIEW OF SYSTEMS:   Constitutional: ( - ) fevers, ( - )  chills , ( - ) night sweats Eyes: ( - ) blurriness of vision, ( - ) double vision, ( - ) watery eyes Ears, nose, mouth, throat, and face: ( - ) mucositis, ( - ) sore throat Respiratory: ( - ) cough, ( - ) dyspnea, ( - ) wheezes Cardiovascular: ( - ) palpitation, ( - ) chest discomfort, ( - ) lower extremity swelling Gastrointestinal:  ( - ) nausea, ( - ) heartburn, ( - ) change in bowel habits Skin: ( - ) abnormal skin rashes Lymphatics: ( - ) new lymphadenopathy, ( - ) easy bruising Neurological: ( - ) numbness, ( - ) tingling, ( - ) new weaknesses Behavioral/Psych: ( - )  mood change, ( - ) new changes  All other systems were reviewed with the patient and are negative.  PHYSICAL EXAMINATION: ECOG  PERFORMANCE STATUS: 1 - Symptomatic but completely ambulatory  Vitals:   04/03/21 0849  BP: 134/68  Pulse: 83  Resp: 17  Temp: 98 F (36.7 C)  SpO2: 98%   Filed Weights   04/03/21 0849  Weight: 146 lb 11.2 oz (66.5 kg)    GENERAL: Well-appearing elderly African-American female, alert, no distress and comfortable SKIN: skin color, texture, turgor are normal, no rashes or significant lesions EYES: conjunctiva are pink and non-injected, sclera clear NECK: supple, non-tender LYMPH: Enlarged lymph nodes in the left cervical chain.  Otherwise no palpable lymphadenopathy in the cervical, axillary or inguinal lymph nodes LUNGS: clear to auscultation and percussion with normal breathing effort HEART: regular rate & rhythm and no murmurs and no lower extremity edema Musculoskeletal: no cyanosis of digits and no clubbing  PSYCH: alert & oriented x 3, fluent speech NEURO: no focal motor/sensory deficits  LABORATORY DATA:  I have reviewed the data as listed CBC Latest Ref Rng & Units 04/03/2021 03/12/2021 02/26/2021  WBC 4.0 - 10.5 K/uL 6.2 5.7 5.9  Hemoglobin 12.0 - 15.0 g/dL 13.6 13.5 13.0  Hematocrit 36.0 - 46.0 % 40.3 40.2 39.2  Platelets 150 - 400 K/uL 260 249 248    CMP Latest Ref Rng & Units 04/03/2021 03/12/2021 02/26/2021  Glucose 70 - 99 mg/dL 114(H) 105(H) 98  BUN 8 - 23 mg/dL 16 15 10   Creatinine 0.44 - 1.00 mg/dL 0.69 0.53 0.57  Sodium 135 - 145 mmol/L 141 138 138  Potassium 3.5 - 5.1 mmol/L 3.8 4.0 3.3(L)  Chloride 98 - 111 mmol/L 105 105 101  CO2 22 - 32 mmol/L 26 26 28   Calcium 8.9 - 10.3 mg/dL 10.0 9.7 9.9  Total Protein 6.5 - 8.1 g/dL 7.2 7.3 7.0  Total Bilirubin 0.3 - 1.2 mg/dL 0.4 0.7 0.5  Alkaline Phos 38 - 126 U/L 78 64 60  AST 15 - 41 U/L 18 19 17   ALT 0 - 44 U/L 16 18 15      RADIOGRAPHIC STUDIES:  MR OUTSIDE FILMS  EXTREMITY  Result Date: 04/03/2021 This examination belongs to an outside facility and is stored here for comparison purposes only.  Contact the originating outside institution for any associated report or interpretation.   ASSESSMENT & PLAN Deanna [name] 75 y.o. female with medical history significant for marginal zone lymphoma who presents for a follow up visit.  At this time the patient's findings are most consistent with marginal zone lymphoma, stage III/IV.  Based on the GELF criteria the patient has a score of 0, indicating that immediate therapy is not required.  Given that this is an indolent lymphoma that he NCCN guidelines recommend observation as a category 1 recommendation.  We can continue to monitor closely in the event the patient were to have rapid progression or new cytopenias or symptoms we would be happy to start therapy.  I would recommend rituximab based therapy, most likely Bendamustine/rituximab.  The patient did seem somewhat skeptical about observational therapy and is also concerned that when the PET CT scan was ordered, the ordering provider noted the diagnosis was mantle cell lymphoma.  I was clear with her that the diagnosis was marginal zone lymphoma based on the pathology report.  I spoke with radiology to have this corrected and additionally have reviewed her problem list and assure that marginal zone lymphoma is the only cancer on her list.  Additionally there does appear to be a gap in trust at this time with the patient having sought  out a second opinion at [facility] in [place] as well as a third opinion at [facility] 2 days ago.  #Marginal Zone Lymphoma, Stage III/IV -- PET CT scan confirms Stage III/IV, due to lymph nodes on both sides of diaphragm and apparent bone marrow involvement --based on the GELF Criteria for indolent lymphomas, treatment is not indicated. The patient carries a score 0. As such immediate therapy is not  required. --per NCCN guidelines, observation is a category 1 recommendation in her situation. If therapy were to be indicated would proceed with rituximab based therapy --repeat CT scan in 6 months time or sooner if patient were to develop symptoms.  --of note, the patient has seen Dr. Claud Kelp at [facility] on 03/10/2021. We are currently working to retrieve her notes, but per patient she was in agreement with the plan to continue monitoring rather than start therapy  --will have patient return to clinic in 3 months time for continued monitoring.   #Fibular Fracture --patient underwent MRI on 03/28/2021 and was found to have fracture of the mid fibular shaft with callus formation and partial healing. --there was also noted to be extensive abnormal marrow signal and enhancement over an approximately 15 cm region of the fibula centered about the fracture line with areas of cortical irregularity --we will have the MRI films reviewed by our radiologists --at this time I have low suspicion that this fracture is related to her lymphoma diagnosis.  --If reviewed by radiology and found to be concerning for a pathological fracture could consider IR guided biopsy of the lesion in order to confirm that it is truly marginal zone lymphoma causing this fracture.  No orders of the defined types were placed in this encounter.   All questions were answered. The patient knows to call the clinic with any problems, questions or concerns.  A total of more than 30 minutes were spent on this encounter with face-to-face time and non-face-to-face time, including preparing to see the patient, ordering tests and/or medications, counseling the patient and coordination of care as outlined above.   Ledell Peoples, MD Department of Hematology/Oncology [place] at [facility] Phone: [phone] Pager: [phone] Email: [email or web address]  04/06/2021 12:56 PM

## 2021-04-07 ENCOUNTER — Encounter: Payer: Self-pay | Admitting: Hematology and Oncology

## 2021-04-10 ENCOUNTER — Other Ambulatory Visit: Payer: Self-pay | Admitting: Hematology and Oncology

## 2021-04-10 ENCOUNTER — Telehealth: Payer: Self-pay | Admitting: *Deleted

## 2021-04-10 DIAGNOSIS — C8580 Other specified types of non-Hodgkin lymphoma, unspecified site: Secondary | ICD-10-CM

## 2021-04-10 NOTE — Telephone Encounter (Signed)
TCT patient per request of Dr. Lorenso Courier regarding 2nd read of her MRI of her lower extremities. Spoke with her and advised that [facility] had finally gotten back to Dr. Lorenso Courier and confirmed the original read of that scan. She does have a lytic lesion in her right tibia. He advises that she needs a biopsy of that area to confirm diagnosis. Pt states she has had a another opinion at [facility] with Dr. Alroy Bailiff. That MD also agrees with this. Dr. Alroy Bailiff has ordered  "Full body Pet Scan" which will be done tomorrow. They are also recommending an biopsy of the lesion in her leg.  Pt states she prefers her care to be local- as in [place]. She states she will call back after the PET Scan. Advised that we will be able to see the scan results in our EMR .  She would prefer to have her biopsy done here.  Dr. Lorenso Courier advised of the above.

## 2021-04-15 ENCOUNTER — Encounter (HOSPITAL_COMMUNITY): Payer: Self-pay | Admitting: Radiology

## 2021-04-15 NOTE — Progress Notes (Signed)
Patient Name  [name], Deanna Utsey Legal Sex  Female DOB  Jul 21, 1945 SSN  [id number] Address  [address]  [place] [address] Phone  [phone] [facility])  [phone] (Mobile) *Preferred*    RE: CT Biopsy Received: Today Greggory Keen, MD  Garth Bigness D Not a bx that we perform. Rec referral to ortho   TS        Previous Messages   ----- Message -----  From: Garth Bigness D  Sent: 04/15/2021   3:36 PM EST  To: Ir Procedure Requests  Subject: FW: CT Biopsy                                   Still need review  ----- Message -----  From: Garth Bigness D  Sent: 04/10/2021   5:00 PM EST  To: Ir Procedure Requests  Subject: CT Biopsy                                       Procedure:  CT Biopsy   Reason:  Marginal zone lymphoma, requesting biopsy of fibula--concern for more aggressive lymphoma given recent MRI   History:   NM, MR, CT in computer   Provider:  Ledell Peoples IV   Provider Contact:  [phone]

## 2021-05-08 ENCOUNTER — Inpatient Hospital Stay: Payer: MEDICARE | Admitting: Hematology and Oncology

## 2021-05-08 ENCOUNTER — Inpatient Hospital Stay: Payer: MEDICARE

## 2021-06-04 ENCOUNTER — Encounter: Payer: MEDICARE | Admitting: Nurse Practitioner

## 2021-06-26 ENCOUNTER — Other Ambulatory Visit: Payer: Self-pay

## 2021-06-26 ENCOUNTER — Encounter: Payer: Self-pay | Admitting: Nurse Practitioner

## 2021-06-26 ENCOUNTER — Ambulatory Visit (INDEPENDENT_AMBULATORY_CARE_PROVIDER_SITE_OTHER): Payer: MEDICARE | Admitting: Nurse Practitioner

## 2021-06-26 VITALS — BP 120/60 | HR 87 | Temp 98.1°F | Ht 63.0 in | Wt 150.0 lb

## 2021-06-26 DIAGNOSIS — R82998 Other abnormal findings in urine: Secondary | ICD-10-CM

## 2021-06-26 DIAGNOSIS — I700 Atherosclerosis of aorta: Secondary | ICD-10-CM

## 2021-06-26 DIAGNOSIS — R7309 Other abnormal glucose: Secondary | ICD-10-CM

## 2021-06-26 DIAGNOSIS — I10 Essential (primary) hypertension: Secondary | ICD-10-CM | POA: Diagnosis not present

## 2021-06-26 DIAGNOSIS — R3915 Urgency of urination: Secondary | ICD-10-CM | POA: Diagnosis not present

## 2021-06-26 DIAGNOSIS — Z1231 Encounter for screening mammogram for malignant neoplasm of breast: Secondary | ICD-10-CM

## 2021-06-26 LAB — POCT URINALYSIS DIPSTICK
Bilirubin, UA: NEGATIVE
Glucose, UA: NEGATIVE
Ketones, UA: NEGATIVE
Nitrite, UA: NEGATIVE
Protein, UA: NEGATIVE
Spec Grav, UA: 1.01 (ref 1.010–1.025)
Urobilinogen, UA: 0.2 E.U./dL
pH, UA: 7 (ref 5.0–8.0)

## 2021-06-26 MED ORDER — NITROFURANTOIN MONOHYD MACRO 100 MG PO CAPS: 100.0000 mg | ORAL_CAPSULE | Freq: Two times a day (BID) | ORAL | 0 refills | Status: AC

## 2021-06-26 MED ORDER — AMLODIPINE BESYLATE 10 MG PO TABS: 10.0000 mg | ORAL_TABLET | Freq: Every day | ORAL | 1 refills | Status: DC

## 2021-06-26 MED ORDER — LOSARTAN POTASSIUM 50 MG PO TABS: 50.0000 mg | ORAL_TABLET | Freq: Every day | ORAL | 1 refills | Status: DC

## 2021-06-26 NOTE — Progress Notes (Signed)
I,[name],acting as a Education administrator for [employer], FNP.,have documented all relevant documentation on the behalf of [name], FNP,as directed by  [name], FNP while in the presence of [name], [place].  This visit occurred during the SARS-CoV-2 public health emergency.  Safety protocols were in place, including screening questions prior to the visit, additional usage of staff PPE, and extensive cleaning of exam room while observing appropriate contact time as indicated for disinfecting solutions.  Subjective:     Patient ID: Deanna [name] , female    DOB: 04-01-1946 , 76 y.o.   MRN: [id number]   Chief Complaint  Patient presents with   Hypertension    HPI  Patient is here for abnormal glucose and bp f/u. She has started going back to the pool for exercises and she does walk. She eats fruits and vegetables. She tries to stay away from carbs will eat 1/2 bagel 2-3 times a week. She occasionally eats potatoes. She does eat cabbage and sweet potatoes.  Does not drink soda or ice tea.   Wt Readings from Last 3 Encounters: 06/26/21 : 150 lb (68 kg) 04/04/21 : 146 lb 1.3 oz (66.3 kg) 04/03/21 : 146 lb 11.2 oz (66.5 kg)   She reports she had lost weight down to 143 lbs.   Hypertension This is a chronic problem. The current episode started more than 1 year ago. The problem is unchanged. The problem is controlled. Pertinent negatives include no chest pain or palpitations. There are no associated agents to hypertension. Risk factors for coronary artery disease include sedentary lifestyle. Past treatments include calcium channel blockers. There are no compliance problems.  There is no history of chronic renal disease.    Past Medical History:  Diagnosis Date   Abnormal glucose 03/06/2020   Age-related osteoporosis without current pathological fracture 11/22/2015   Overview:  Femoral neck  Formatting of this note might be different from the original. Femoral neck   Allergic  rhinitis due to pollen 08/19/2015   Allergy    Arthritis    Carpal tunnel syndrome, bilateral 02/28/2002   Overview:  H/O CTS   Celiac disease    Cervical disc disorder 05/10/2009   Chronic cough 02/15/2006   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 11/26/2015   Formatting of this note might be different from the original. Added automatically from request for surgery [id number]   Chronic pain syndrome 04/01/2015   Colon polyps    Essential hypertension 07/20/2000   Overview:  HBP   Female cystocele 02/27/2009   GAD (generalized anxiety disorder) 08/19/2015   GERD (gastroesophageal reflux disease)    History of Bell's palsy 03/21/2015   Hx of adenomatous colonic polyps 12/20/2008   Overview:  Colonoscopy 07/2003 - [facility] - polyps removed - recommended repeat in 3 years Colonoscopy 09/2011 - Dr. Marian Sorrow - hyperplastic polyp(s) were removed - repeat in 5 years   Hypertension    Incomplete uterovaginal prolapse 03/04/2004   Insomnia    Left bundle-branch block 05/20/2006   Lumbar radiculopathy 01/09/2016   Formatting of this note might be different from the original. 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 Femoral neck   Other intervertebral disc degeneration, lumbar region 01/29/2015   Pre-operative cardiovascular examination  09/03/2020   Primary osteoarthritis, right hand 02/28/2002   Overview:  OA HANDS, ESP THUMBS   Radial tunnel syndrome, right 11/04/2016   Radiculopathy, lumbar region 11/22/2014   Overview:  Added automatically from request for surgery [id number]   Rhinitis, allergic 02/24/2002   Overview:  ALLERGY SYMPTOMS - states was seen by allergist in past and informed allergic to dust    S/P left total hip arthroplasty 09/17/2020   Sacroiliitis ([place]) 08/02/2019   Screening for colon cancer 04/17/2005   Formatting of this note might be different from the original. 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 TRIGGER THUMBS - started spring 2003. Injected 02/28/02   Unspecified asthma, uncomplicated 1/91/4782     Family History  Problem Relation Age of Onset   Arthritis Mother    Diabetes Mother    Pancreatic cancer Mother 33   Early death Father    Leukemia Father    Leukemia Sister    Early death Sister    Diabetes Sister      Current Outpatient Medications:    amLODipine (NORVASC) 10 MG tablet, Take 1 tablet (10 mg total) by mouth daily., Disp: 90 tablet, Rfl: 1   Ascorbic Acid (VITAMIN C) 1000 MG tablet, Take 2,000 mg by mouth daily., Disp: , Rfl:    atorvastatin (LIPITOR) 10 MG tablet, Take 1 tab by mouth MWF, Disp: 45 tablet, Rfl: 1   Calcium-Magnesium (CAL-MAG PO), Take 2,000 mg by mouth at bedtime. 1000 mg, Disp: , Rfl:    Carboxymethylcellul-Glycerin (CLEAR EYES FOR DRY EYES OP), Place 1 drop into both eyes daily., Disp: , Rfl:    Cholecalciferol (VITAMIN D PO), Take 2,000 Units by mouth daily., Disp: , Rfl:    dexlansoprazole (DEXILANT) 60 MG capsule, Take 1 capsule (60 mg total) by mouth daily., Disp: 30 capsule, Rfl: 5   fexofenadine (ALLEGRA) 180 MG tablet, TAKE 1 TABLET BY MOUTH EVERY DAY, Disp: 30 tablet, Rfl: 2   hydrochlorothiazide (HYDRODIURIL) 25 MG tablet, Take 1 tablet (25 mg total) by mouth 2 (two) times daily., Disp: 180 tablet, Rfl: 3   hydrocortisone (ANUSOL-HC) 25 MG suppository, Place 1  suppository (25 mg total) rectally 2 (two) times daily as needed for hemorrhoids or anal itching., Disp: 12 suppository, Rfl: 3   losartan (COZAAR) 50 MG tablet, Take 1 tablet (50 mg total) by mouth daily., Disp: 90 tablet, Rfl: 1   Magnesium 250 MG TABS, Take 1 tablet (250 mg total) by mouth every evening. Take with evening meal, Disp: 90 tablet, Rfl: 1   milk thistle 175 MG tablet, Take 175 mg by mouth daily., Disp: , Rfl:    mometasone (NASONEX) 50 MCG/ACT nasal spray, Place 2 sprays into the nose daily., Disp: 1 each, Rfl: 2   OVER THE COUNTER MEDICATION, Take 2,000 mg by mouth daily. Pau-D'arco 1000 mg each, Disp: , Rfl:    polyethylene glycol (MIRALAX / GLYCOLAX) 17 g packet, Take 17 g by mouth daily as needed for mild constipation., Disp: 14 each, Rfl: 0   Probiotic Product (PROBIOTIC DAILY PO), Take 1 tablet by mouth daily., Disp: , Rfl:    traMADol (ULTRAM) 50 MG  tablet, SMARTSIG:1-2 Tablet(s) By Mouth 2-3 Times Daily PRN, Disp: , Rfl:    Vitamin A 2400 MCG (8000 UT) CAPS, Take 16,000 Units by mouth daily. 8000 units, Disp: , Rfl:    Allergies  Allergen Reactions   Codeine Nausea And Vomiting and Palpitations    Chest pains, acid reflux  Other reaction(s): Nausea and/or Vomiting, Other, Other (See Comments), Other (See Comments) Chest pain and acid reflux Chest pains, acid reflux  Codeine made her feel like she was having a heart attack. Chest pain Feels like she is having a heart attack. Feels like she is having a heart attack. Codeine made her feel like she was having a heart attack. Chest pain Feels like she is having a heart attack. Chest pain Feels like she is having a heart attack. Chest pains, acid reflux    Gluten Meal Diarrhea and Other (See Comments)    Severe diarrhea per member    Dust Mite Extract     Upper Respiratory issues    Gabapentin     Eyes went numb and blurry   Sulfa Antibiotics Other (See Comments)   Terbinafine Hcl Rash     Review of Systems   Constitutional: Negative.   Respiratory: Negative.    Cardiovascular: Negative.  Negative for chest pain, palpitations and leg swelling.  Gastrointestinal: Negative.   Genitourinary:  Positive for urgency (she feels pressure when she does not drink 67 oz water a day).  Neurological: Negative.     Today's Vitals   06/26/21 1158  BP: 120/60  Pulse: 87  Temp: 98.1 F (36.7 C)  TempSrc: Oral  Weight: 150 lb (68 kg)  Height: 5' 3"  (1.6 m)   Body mass index is 26.57 kg/m.   Objective:  Physical Exam Vitals reviewed.  Constitutional:      General: She is not in acute distress.    Appearance: Normal appearance.  Cardiovascular:     Rate and Rhythm: Normal rate and regular rhythm.     Pulses: Normal pulses.     Heart sounds: Normal heart sounds. No murmur heard. Pulmonary:     Effort: Pulmonary effort is normal. No respiratory distress.     Breath sounds: Normal breath sounds. No wheezing.  Abdominal:     General: Abdomen is flat. Bowel sounds are normal. There is no distension.     Palpations: Abdomen is soft. There is no mass.     Tenderness: There is no abdominal tenderness.  Neurological:     General: No focal deficit present.     Mental Status: She is alert and oriented to [place], place, and time.     Cranial Nerves: No cranial nerve deficit.     Motor: No weakness.  Psychiatric:        Mood and Affect: Mood normal.        Behavior: Behavior normal.        Thought Content: Thought content normal.        Judgment: Judgment normal.        Assessment And Plan:     1. Abnormal glucose Comments: Stable, diet controlled.   2. Essential hypertension Comments: Blood pressure is well controlled, continue current medications - amLODipine (NORVASC) 10 MG tablet; Take 1 tablet (10 mg total) by mouth daily.  Dispense: 90 tablet; Refill: 1  3. Atherosclerosis of aorta ([place]) Comments: Tolerating statin well.  4. Urinary urgency Comments: Will treat with antibiotic based  on symptoms and having white cells in urine. will send for  culture - Hemoglobin A1c - POCT Urinalysis Dipstick (81002) - nitrofurantoin, macrocrystal-monohydrate, (MACROBID) 100 MG capsule; Take 1 capsule (100 mg total) by mouth 2 (two) times daily for 5 days.  Dispense: 10 capsule; Refill: 0  5. Urine white blood cells increased - Culture, Urine - nitrofurantoin, macrocrystal-monohydrate, (MACROBID) 100 MG capsule; Take 1 capsule (100 mg total) by mouth 2 (two) times daily for 5 days.  Dispense: 10 capsule; Refill: 0  6. Encounter for screening mammogram for breast cancer Pt instructed on Self Breast Exam.According to ACOG guidelines Women aged 44 and older are recommended to get an annual mammogram. Form completed and given to patient contact the [facility] for appointment scheduing.  Pt encouraged to get annual mammogram - MM Digital Screening; Future     Patient was given opportunity to ask questions. Patient verbalized understanding of the plan and was able to repeat key elements of the plan. All questions were answered to their satisfaction.  [name], FNP    I, [name], FNP, have reviewed all documentation for this visit. The documentation on 06/26/21 for the exam, diagnosis, procedures, and orders are all accurate and complete.  IF YOU HAVE BEEN REFERRED TO A SPECIALIST, IT MAY TAKE 1-2 WEEKS TO SCHEDULE/PROCESS THE REFERRAL. IF YOU HAVE NOT HEARD FROM US/SPECIALIST IN TWO WEEKS, PLEASE GIVE [place] A CALL AT [phone].   THE PATIENT IS ENCOURAGED TO PRACTICE SOCIAL DISTANCING DUE TO THE COVID-19 PANDEMIC.

## 2021-06-27 LAB — HEMOGLOBIN A1C
Est. average glucose Bld gHb Est-mCnc: 126 mg/dL
Hgb A1c MFr Bld: 6 % — ABNORMAL HIGH (ref 4.8–5.6)

## 2021-06-29 LAB — URINE CULTURE

## 2021-06-30 ENCOUNTER — Other Ambulatory Visit: Payer: Self-pay | Admitting: Nurse Practitioner

## 2021-07-14 ENCOUNTER — Encounter: Payer: Self-pay | Admitting: Nurse Practitioner

## 2021-07-15 ENCOUNTER — Telehealth: Payer: Self-pay

## 2021-07-15 NOTE — Telephone Encounter (Signed)
Spoke with pt about message left. Pt reports reoccurring UTI. She states medication for first UTI did not work and would like another prescription. Called pt back to sch lab appointment. Pt stated she contacted urologist, they called her in for an appointment to recheck urine, she will follow up with them.

## 2021-07-22 ENCOUNTER — Ambulatory Visit: Payer: MEDICARE

## 2021-08-06 ENCOUNTER — Ambulatory Visit
Admission: RE | Admit: 2021-08-06 | Discharge: 2021-08-06 | Disposition: A | Discharge status: Home / Self Care | Payer: MEDICARE | Source: Ambulatory Visit | Attending: Nurse Practitioner | Admitting: Nurse Practitioner

## 2021-08-06 DIAGNOSIS — Z1231 Encounter for screening mammogram for malignant neoplasm of breast: Secondary | ICD-10-CM

## 2021-08-14 ENCOUNTER — Encounter: Payer: Self-pay | Admitting: Nurse Practitioner

## 2021-08-25 ENCOUNTER — Encounter: Payer: Self-pay | Admitting: Nurse Practitioner

## 2021-09-14 ENCOUNTER — Other Ambulatory Visit: Payer: Self-pay | Admitting: Nurse Practitioner

## 2021-09-14 DIAGNOSIS — R252 Cramp and spasm: Secondary | ICD-10-CM

## 2021-10-24 IMAGING — CT CT CHEST W/ CM
2 of 4 series · 15 of 36 positions shown, 18 images · IV contrast (omnipaque)
Comparison: None.

CLINICAL DATA: Patient reports a lump on her neck since [REDACTED].

EXAM:
CT CHEST WITH CONTRAST
TECHNIQUE: Multidetector CT imaging of the chest was performed during
intravenous contrast administration.
CONTRAST:  75mL OMNIPAQUE IOHEXOL 350 MG/ML SOLN

[Series 2: routine chest with · axial · 0.65mm/px · z∈[+1106,+1370]mm · 12 of 157 slices shown, 15 images]
[im 13/157  mediastinal]
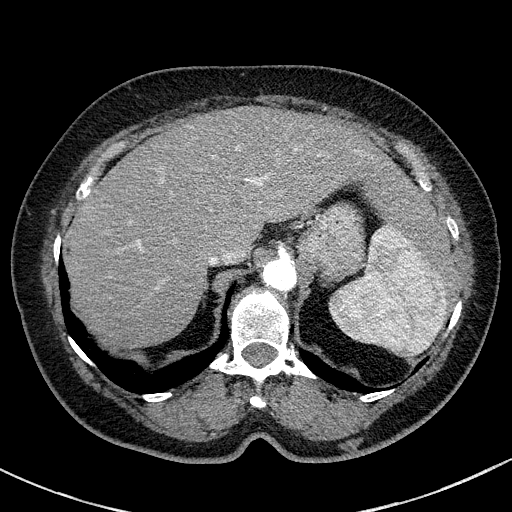
[im 13/157  lung]
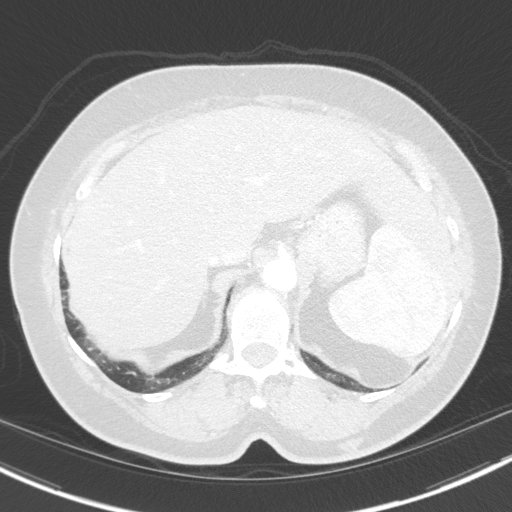
[im 25/157  lung]
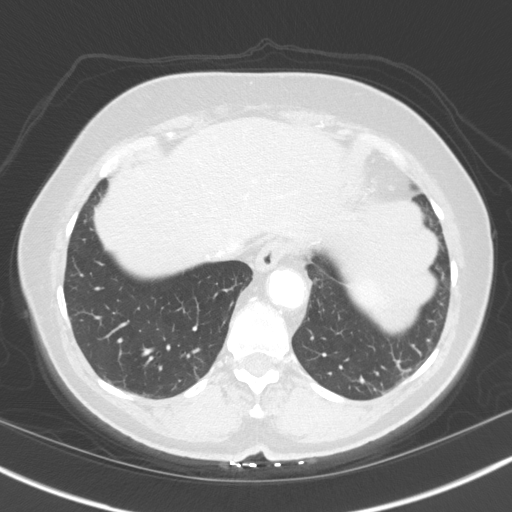
[im 37/157  lung]
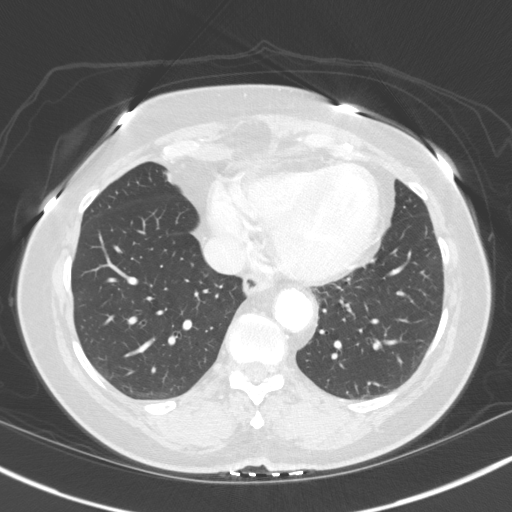
[im 49/157  lung]
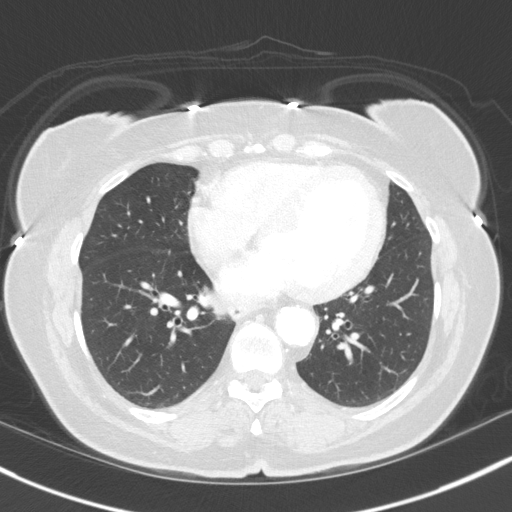
[im 61/157  mediastinal]
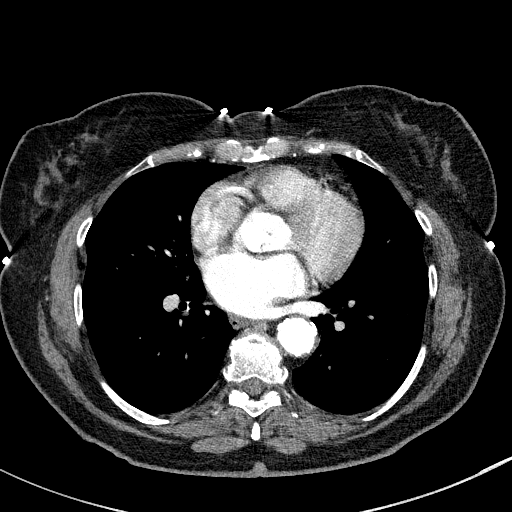
[im 61/157  lung]
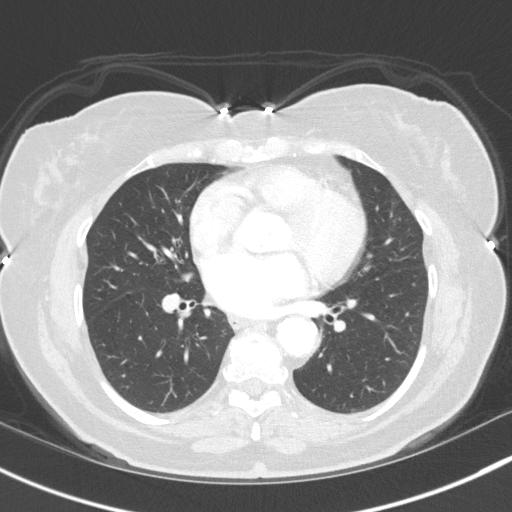
[im 73/157  lung]
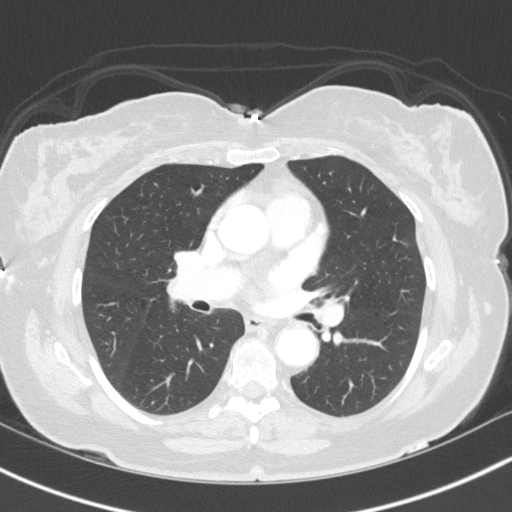
[im 85/157  lung]
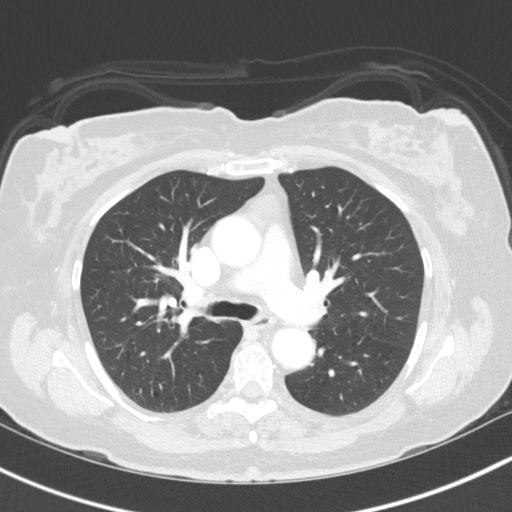
[im 97/157  lung]
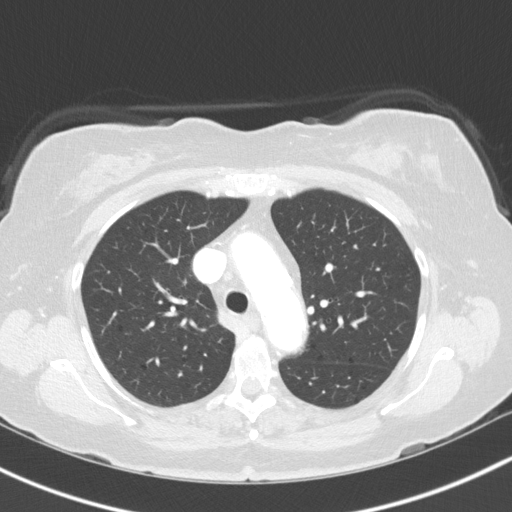
[im 109/157  mediastinal]
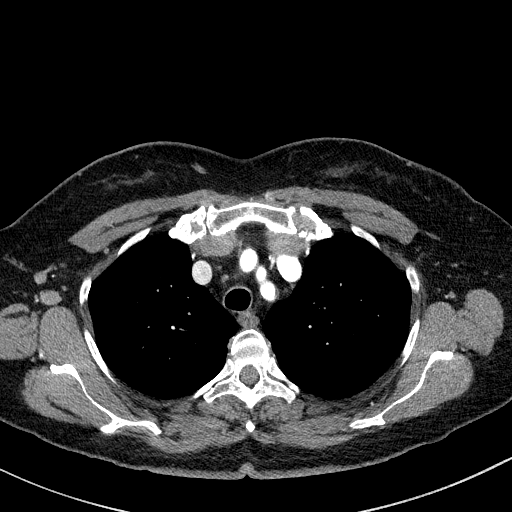
[im 109/157  lung]
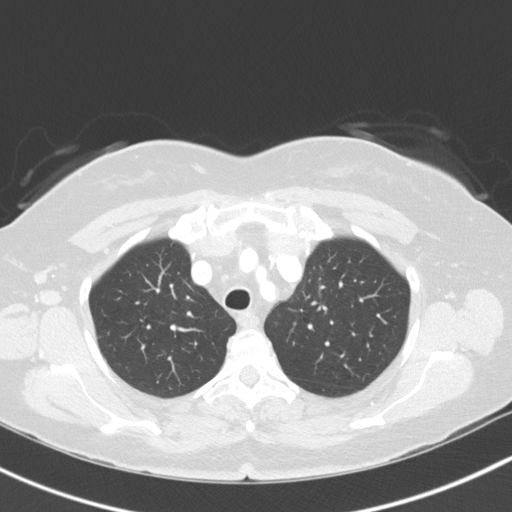
[im 121/157  lung]
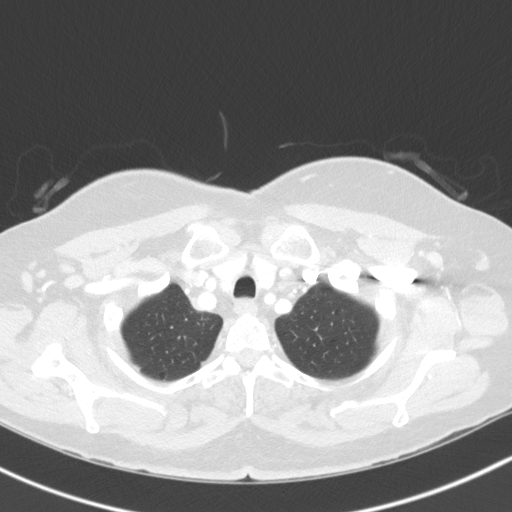
[im 133/157  lung]
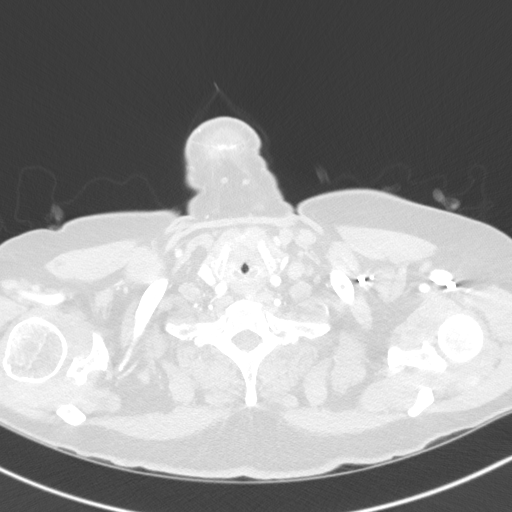
[im 145/157  lung]
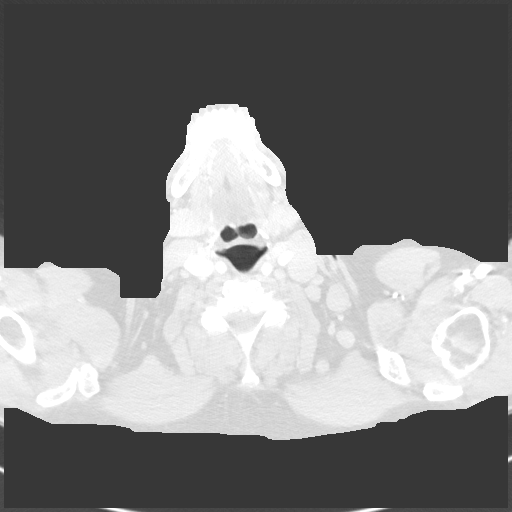

[Series 5: coronal · coronal · 0.63mm/px · 3 of 138 slices shown]
[im 28/138  lung]
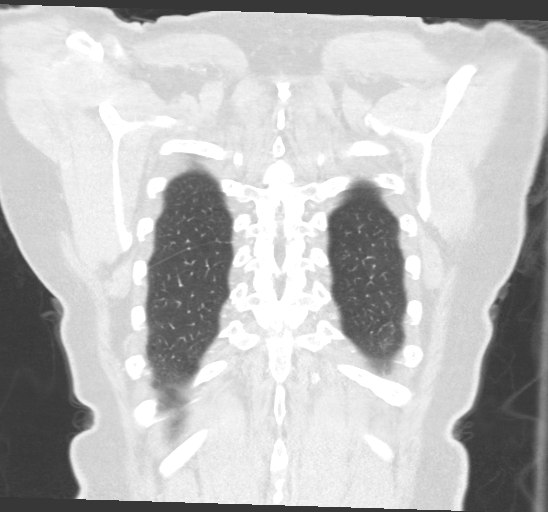
[im 55/138  lung]
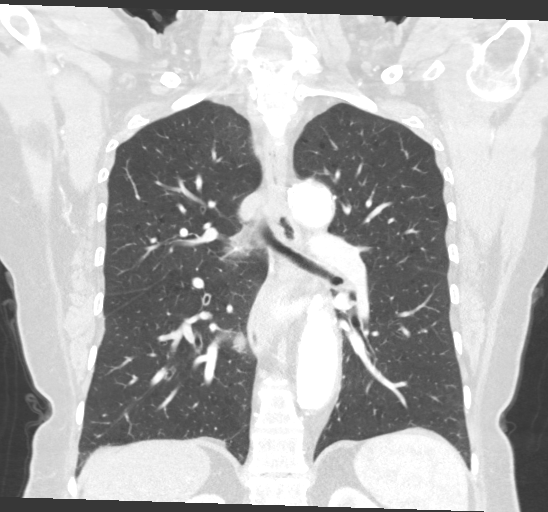
[im 83/138  lung]
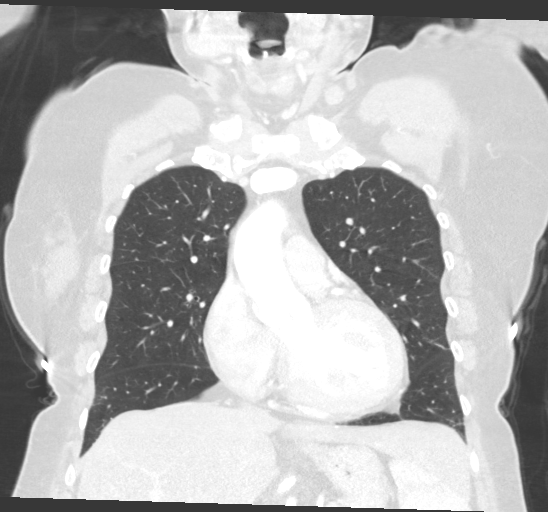

[15 of 36 positions shown; findings below may reference images not displayed]

FINDINGS: Cardiovascular: There is moderate to marked severity calcification
of the aortic arch and descending thoracic aorta. Normal heart size.
No pericardial effusion.

Mediastinum/Nodes: There is mild bilateral axillary lymphadenopathy
(the largest axillary lymph node is seen on the right and measures
approximately 1.3 cm). Enlarged lateral supraclavicular and lower
jugular lymph nodes are seen on the left. The largest measures
approximately 1.9 cm (axial CT image 9, CT series 2).

Focal narrowing of the subglottic trachea is seen.

Thyroid gland and esophagus demonstrate no significant findings.

Lungs/Pleura: Lungs are clear. No pleural effusion or pneumothorax.

Upper Abdomen: There is diffuse fatty infiltration of the liver
parenchyma.

Musculoskeletal: Degenerative changes seen throughout the thoracic
spine.
IMPRESSION: 1. Left-sided lateral supraclavicular and lower jugular
lymphadenopathy. While this may be reactive in nature, further
evaluation is recommended to exclude the presence of an underlying
neoplasm.
2. Focal narrowing of the subglottic trachea without visualization
of an associated abscess or mass lesion. Further evaluation with
contrast enhanced neck CT is recommended.

## 2021-10-29 ENCOUNTER — Ambulatory Visit: Payer: MEDICARE | Admitting: Nurse Practitioner

## 2021-10-30 ENCOUNTER — Encounter: Payer: Self-pay | Admitting: Nurse Practitioner

## 2021-10-30 ENCOUNTER — Ambulatory Visit (INDEPENDENT_AMBULATORY_CARE_PROVIDER_SITE_OTHER): Payer: MEDICARE | Admitting: Nurse Practitioner

## 2021-10-30 VITALS — BP 124/62 | HR 82 | Temp 98.1°F | Ht 63.0 in | Wt 147.0 lb

## 2021-10-30 DIAGNOSIS — I10 Essential (primary) hypertension: Secondary | ICD-10-CM | POA: Diagnosis not present

## 2021-10-30 DIAGNOSIS — I700 Atherosclerosis of aorta: Secondary | ICD-10-CM | POA: Diagnosis not present

## 2021-10-30 DIAGNOSIS — J301 Allergic rhinitis due to pollen: Secondary | ICD-10-CM

## 2021-10-30 DIAGNOSIS — R7309 Other abnormal glucose: Secondary | ICD-10-CM | POA: Diagnosis not present

## 2021-10-30 DIAGNOSIS — E782 Mixed hyperlipidemia: Secondary | ICD-10-CM

## 2021-10-30 MED ORDER — FEXOFENADINE HCL 180 MG PO TABS: 180.0000 mg | ORAL_TABLET | Freq: Every day | ORAL | 2 refills | Status: DC

## 2021-10-30 NOTE — Progress Notes (Signed)
I,Deanna [name],acting as a Education administrator for [employer], FNP.,have documented all relevant documentation on the behalf of Deanna Brine, FNP,as directed by  Deanna Brine, FNP while in the presence of Deanna [name], [place].  This visit occurred during the SARS-CoV-2 public health emergency.  Safety protocols were in place, including screening questions prior to the visit, additional usage of staff PPE, and extensive cleaning of exam room while observing appropriate contact time as indicated for disinfecting solutions.  Subjective:     Patient ID: Deanna [name] , female    DOB: [birth year] , 76 y.o.   MRN: [id number]   Chief Complaint  Patient presents with   Diabetes    HPI  Patient is here for abnormal glucose and bp f/u. She had a biopsy done to the roof of her mouth was lymphoma related and her PET scan reports her platelets were normal. She is no longer taking atorvastatin due to feeling like having a reaction. She is taking EGB15, garlic. She is doing 22 minute stretch. Was going to the pool until she injured her left arm now in PT for her arm.  Diabetes She presents for her follow-up diabetic visit. Diabetes type: prediabetes. There are no hypoglycemic associated symptoms. Pertinent negatives for diabetes include no chest pain. There are no hypoglycemic complications. There are no diabetic complications. Risk factors for coronary artery disease include sedentary lifestyle. When asked about current treatments, none were reported. She does not see a podiatrist.Eye exam is not current.  Hypertension This is a chronic problem. The current episode started more than 1 year ago. The problem is unchanged. The problem is controlled. Pertinent negatives include no chest pain or palpitations. There are no associated agents to hypertension. Risk factors for coronary artery disease include sedentary lifestyle. Past treatments include calcium channel blockers. There are no compliance problems.  There is no  history of chronic renal disease.     Past Medical History:  Diagnosis Date   Abnormal glucose 03/06/2020   Age-related osteoporosis without current pathological fracture 11/22/2015   Overview:  Femoral neck  Formatting of this note might be different from the original. 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 Added automatically from request for surgery [id number]   Chronic pain syndrome 04/01/2015   Colon polyps    Essential hypertension 07/20/2000   Overview:  HBP   Female cystocele 02/27/2009   GAD (generalized anxiety disorder) 08/19/2015   GERD (gastroesophageal reflux disease)    History of Bell's palsy 03/21/2015   Hx of adenomatous colonic polyps 12/20/2008   Overview:  Colonoscopy 07/2003 - [facility] - polyps removed - recommended repeat in 3 years Colonoscopy 09/2011 - Dr. Marian Sorrow - hyperplastic polyp(s) were removed - repeat in 5 years   Hypertension    Incomplete uterovaginal prolapse 03/04/2004   Insomnia    Left bundle-branch block 05/20/2006   Lumbar radiculopathy 01/09/2016   Formatting of this note might be different from the original. 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 Femoral neck   Other intervertebral disc degeneration, lumbar region 01/29/2015   Pre-operative cardiovascular examination 09/03/2020   Primary osteoarthritis, right hand 02/28/2002   Overview:  OA HANDS,  ESP THUMBS   Radial tunnel syndrome, right 11/04/2016   Radiculopathy, lumbar region 11/22/2014   Overview:  Added automatically from request for surgery [id number]   Rhinitis, allergic 02/24/2002   Overview:  ALLERGY SYMPTOMS - states was seen by allergist in past and informed allergic to dust   S/P left total hip arthroplasty 09/17/2020   Sacroiliitis ([place]) 08/02/2019   Screening for colon cancer 04/17/2005   Formatting of this note might be different from the original. 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 TRIGGER THUMBS - started spring 2003. Injected 02/28/02   Unspecified asthma, uncomplicated 4/68/0321     Family History  Problem Relation Age of Onset   Arthritis Mother    Diabetes Mother    Pancreatic cancer Mother 84   Early death Father    Leukemia Father    Leukemia Sister    Early death Sister    Diabetes Sister      Current Outpatient Medications:    amLODipine (NORVASC) 10 MG tablet, Take 1 tablet (10 mg total) by mouth daily., Disp: 90 tablet, Rfl: 1   Ascorbic Acid (VITAMIN C) 1000 MG tablet, Take 2,000 mg by mouth daily., Disp: , Rfl:    atorvastatin (LIPITOR) 10 MG tablet, Take 1 tab by mouth MWF, Disp: 45 tablet, Rfl: 1   Calcium-Magnesium (CAL-MAG PO), Take 2,000 mg by mouth at bedtime. 1000 mg, Disp: , Rfl:    Carboxymethylcellul-Glycerin (CLEAR EYES FOR DRY EYES OP), Place 1 drop into both eyes daily., Disp: , Rfl:    Cholecalciferol (VITAMIN D PO), Take 2,000 Units by mouth daily., Disp: , Rfl:    [employer] MAGNESIUM OXIDE 250 MG TABS, TAKE 1 TABLET (250 MG TOTAL) BY MOUTH EVERY EVENING. TAKE WITH EVENING MEAL,  Disp: 100 tablet, Rfl: 1   dexlansoprazole (DEXILANT) 60 MG capsule, Take 1 capsule (60 mg total) by mouth daily., Disp: 30 capsule, Rfl: 5   fexofenadine (ALLEGRA) 180 MG tablet, Take 1 tablet (180 mg total) by mouth daily., Disp: 90 tablet, Rfl: 2   hydrochlorothiazide (HYDRODIURIL) 25 MG tablet, Take 1 tablet (25 mg total) by mouth 2 (two) times daily., Disp: 180 tablet, Rfl: 3   hydrocortisone (ANUSOL-HC) 25 MG suppository, Place 1 suppository (25 mg total) rectally 2 (two) times daily as needed for hemorrhoids or anal itching., Disp: 12 suppository, Rfl: 3   losartan (COZAAR) 50 MG tablet, Take 1 tablet (50 mg total) by mouth daily., Disp: 90 tablet, Rfl: 1   milk thistle 175 MG tablet, Take 175 mg by mouth daily., Disp: , Rfl:    mometasone (NASONEX) 50 MCG/ACT nasal spray, Place 2 sprays into the nose daily., Disp: 1 each, Rfl: 2   OVER THE COUNTER MEDICATION, Take 2,000 mg by mouth daily. Pau-D'arco 1000 mg each, Disp: , Rfl:    polyethylene glycol (MIRALAX / GLYCOLAX) 17 g packet, Take 17 g  by mouth daily as needed for mild constipation., Disp: 14 each, Rfl: 0   Probiotic Product (PROBIOTIC DAILY PO), Take 1 tablet by mouth daily., Disp: , Rfl:    Vitamin A 2400 MCG (8000 UT) CAPS, Take 16,000 Units by mouth daily. 8000 units, Disp: , Rfl:    Allergies  Allergen Reactions   Codeine Nausea And Vomiting and Palpitations    Chest pains, acid reflux  Other reaction(s): Nausea and/or Vomiting, Other, Other (See Comments), Other (See Comments) Chest pain and acid reflux Chest pains, acid reflux  Codeine made her feel like she was having a heart attack. Chest pain Feels like she is having a heart attack. Feels like she is having a heart attack. Codeine made her feel like she was having a heart attack. Chest pain Feels like she is having a heart attack. Chest pain Feels like she is having a heart attack. Chest pains, acid reflux    Gluten Meal Diarrhea and Other (See Comments)     Severe diarrhea per member    Dust Mite Extract     Upper Respiratory issues    Gabapentin     Eyes went numb and blurry   Sulfa Antibiotics Other (See Comments)   Terbinafine Hcl Rash     Review of Systems  Constitutional: Negative.   Respiratory:  Positive for cough.   Cardiovascular: Negative.  Negative for chest pain and palpitations.  Gastrointestinal: Negative.   Neurological: Negative.   Psychiatric/Behavioral: Negative.       Today's Vitals   10/30/21 1239  BP: 124/62  Pulse: 82  Temp: 98.1 F (36.7 C)  TempSrc: Oral  Weight: 147 lb (66.7 kg)  Height: 5' 3"  (1.6 m)   Body mass index is 26.04 kg/m.  Wt Readings from Last 3 Encounters:  10/30/21 147 lb (66.7 kg)  06/26/21 150 lb (68 kg)  04/04/21 146 lb 1.3 oz (66.3 kg)    Objective:  Physical Exam Vitals reviewed.  Constitutional:      General: She is not in acute distress.    Appearance: Normal appearance.  Cardiovascular:     Rate and Rhythm: Normal rate and regular rhythm.     Pulses: Normal pulses.     Heart sounds: Normal heart sounds. No murmur heard. Pulmonary:     Effort: Pulmonary effort is normal. No respiratory distress.     Breath sounds: Normal breath sounds. No wheezing.  Abdominal:     General: Abdomen is flat. Bowel sounds are normal. There is no distension.     Palpations: Abdomen is soft. There is no mass.     Tenderness: There is no abdominal tenderness.  Neurological:     General: No focal deficit present.     Mental Status: She is alert and oriented to [place], place, and time.     Cranial Nerves: No cranial nerve deficit.     Motor: No weakness.  Psychiatric:        Mood and Affect: Mood normal.        Behavior: Behavior normal.        Thought Content: Thought content normal.        Judgment: Judgment normal.         Assessment And Plan:     1. Essential hypertension Comments: Blood pressure is well controlled. Continue current medications.   2. Atherosclerosis of  aorta (HCC) Comments: No longer taking statin, due to side effects.  - Hemoglobin A1c; Future - Lipid panel; Future  3. Abnormal  glucose Comments: Stable, Diet controlled. continue focusing on healthy diet and regular exercise as tolerated - Hemoglobin A1c; Future  4. Mixed dyslipidemia Comments: No longer takng statin due to side effects. Will check levels and pending results will consider referral to specialist. - Lipid panel; Future  5. Seasonal allergic rhinitis due to pollen - fexofenadine (ALLEGRA) 180 MG tablet; Take 1 tablet (180 mg total) by mouth daily.  Dispense: 90 tablet; Refill: 2     Patient was given opportunity to ask questions. Patient verbalized understanding of the plan and was able to repeat key elements of the plan. All questions were answered to their satisfaction.  Deanna Brine, FNP   I, Deanna Brine, FNP, have reviewed all documentation for this visit. The documentation on 10/30/21 for the exam, diagnosis, procedures, and orders are all accurate and complete.   IF YOU HAVE BEEN REFERRED TO A SPECIALIST, IT MAY TAKE 1-2 WEEKS TO SCHEDULE/PROCESS THE REFERRAL. IF YOU HAVE NOT HEARD FROM US/SPECIALIST IN TWO WEEKS, PLEASE GIVE [place] A CALL AT [phone].   THE PATIENT IS ENCOURAGED TO PRACTICE SOCIAL DISTANCING DUE TO THE COVID-19 PANDEMIC.

## 2021-10-30 NOTE — Patient Instructions (Signed)

## 2021-11-04 ENCOUNTER — Other Ambulatory Visit: Payer: MEDICARE

## 2021-11-04 DIAGNOSIS — R7309 Other abnormal glucose: Secondary | ICD-10-CM

## 2021-11-04 DIAGNOSIS — I700 Atherosclerosis of aorta: Secondary | ICD-10-CM

## 2021-11-04 DIAGNOSIS — E782 Mixed hyperlipidemia: Secondary | ICD-10-CM

## 2021-11-04 LAB — HEMOGLOBIN A1C
Est. average glucose Bld gHb Est-mCnc: 120 mg/dL
Hgb A1c MFr Bld: 5.8 % — ABNORMAL HIGH (ref 4.8–5.6)

## 2021-11-05 LAB — LIPID PANEL
Chol/HDL Ratio: 2.8 ratio (ref 0.0–4.4)
Cholesterol, Total: 153 mg/dL (ref 100–199)
HDL: 54 mg/dL (ref >39)
LDL Chol Calc (NIH): 89 mg/dL (ref 0–99)
Triglycerides: 47 mg/dL (ref 0–149)
VLDL Cholesterol Cal: 10 mg/dL (ref 5–40)

## 2021-11-15 ENCOUNTER — Other Ambulatory Visit: Payer: Self-pay | Admitting: Nurse Practitioner

## 2021-11-15 DIAGNOSIS — I10 Essential (primary) hypertension: Secondary | ICD-10-CM

## 2021-11-15 DIAGNOSIS — J301 Allergic rhinitis due to pollen: Secondary | ICD-10-CM

## 2021-11-19 ENCOUNTER — Encounter: Payer: Self-pay | Admitting: Nurse Practitioner

## 2021-12-01 ENCOUNTER — Encounter: Payer: Self-pay | Admitting: Nurse Practitioner

## 2021-12-01 MED ORDER — AMOXICILLIN-POT CLAVULANATE 500-125 MG PO TABS: 1.0000 | ORAL_TABLET | Freq: Three times a day (TID) | ORAL | 0 refills | Status: DC

## 2021-12-01 MED ORDER — HYDROCORTISONE 1 % EX CREA: TOPICAL_CREAM | CUTANEOUS | 1 refills | Status: AC

## 2021-12-01 NOTE — Addendum Note (Signed)
Addended by: [name] F on: 12/01/2021 06:24 PM   Modules accepted: Orders

## 2021-12-24 ENCOUNTER — Telehealth: Payer: Self-pay | Admitting: Nurse Practitioner

## 2021-12-24 NOTE — Telephone Encounter (Signed)
Left message for patient to call back and schedule Medicare Annual Wellness Visit (AWV) either virtually or in office.  Left both my jabber number [phone] and office number    Last AWV ;12/26/20  please schedule at anytime with [facility]

## 2022-01-09 ENCOUNTER — Emergency Department (HOSPITAL_BASED_OUTPATIENT_CLINIC_OR_DEPARTMENT_OTHER)
Admission: EM | Admit: 2022-01-09 | Discharge: 2022-01-09 | Disposition: A | Discharge status: Home / Self Care | Payer: MEDICARE | Attending: Emergency Medicine | Admitting: Emergency Medicine

## 2022-01-09 ENCOUNTER — Emergency Department (HOSPITAL_BASED_OUTPATIENT_CLINIC_OR_DEPARTMENT_OTHER): Payer: MEDICARE | Admitting: Radiology

## 2022-01-09 ENCOUNTER — Other Ambulatory Visit: Payer: Self-pay

## 2022-01-09 ENCOUNTER — Encounter (HOSPITAL_BASED_OUTPATIENT_CLINIC_OR_DEPARTMENT_OTHER): Payer: Self-pay

## 2022-01-09 DIAGNOSIS — Z5321 Procedure and treatment not carried out due to patient leaving prior to being seen by health care provider: Secondary | ICD-10-CM | POA: Insufficient documentation

## 2022-01-09 DIAGNOSIS — R253 Fasciculation: Secondary | ICD-10-CM | POA: Insufficient documentation

## 2022-01-09 DIAGNOSIS — R079 Chest pain, unspecified: Secondary | ICD-10-CM | POA: Insufficient documentation

## 2022-01-09 DIAGNOSIS — R0602 Shortness of breath: Secondary | ICD-10-CM | POA: Insufficient documentation

## 2022-01-09 LAB — BASIC METABOLIC PANEL
Anion gap: 10 (ref 5–15)
BUN: 17 mg/dL (ref 8–23)
CO2: 25 mmol/L (ref 22–32)
Calcium: 9.8 mg/dL (ref 8.9–10.3)
Chloride: 102 mmol/L (ref 98–111)
Creatinine, Ser: 0.64 mg/dL (ref 0.44–1.00)
GFR, Estimated: >60 mL/min (ref >60)
Glucose, Bld: 94 mg/dL (ref 70–99)
Potassium: 3.4 mmol/L — ABNORMAL LOW (ref 3.5–5.1)
Sodium: 137 mmol/L (ref 135–145)

## 2022-01-09 LAB — CBC
HCT: 40.2 % (ref 36.0–46.0)
Hemoglobin: 13.9 g/dL (ref 12.0–15.0)
MCH: 29 pg (ref 26.0–34.0)
MCHC: 34.6 g/dL (ref 30.0–36.0)
MCV: 83.8 fL (ref 80.0–100.0)
Platelets: 228 10*3/uL (ref 150–400)
RBC: 4.8 MIL/uL (ref 3.87–5.11)
RDW: 13.2 % (ref 11.5–15.5)
WBC: 6.7 10*3/uL (ref 4.0–10.5)
nRBC: 0 % (ref 0.0–0.2)

## 2022-01-09 LAB — TROPONIN I (HIGH SENSITIVITY): Troponin I (High Sensitivity): 22 ng/L — ABNORMAL HIGH (ref <18)

## 2022-01-09 NOTE — ED Triage Notes (Signed)
Patient here POV form Home.  Endorses SOB and CP for approximately 3 Days. Pain is Mainly Shooting-Like and Mid Chest. Symptoms worsened today. Also endorses Muscle Twitching.   No Cough. No Fevers. No N/V/D.   NAD Noted during Triage. A&Ox4. GCS 15. Ambulatory.

## 2022-01-14 ENCOUNTER — Ambulatory Visit (INDEPENDENT_AMBULATORY_CARE_PROVIDER_SITE_OTHER): Payer: MEDICARE

## 2022-01-14 VITALS — BP 122/60 | HR 77 | Temp 98.0°F | Ht 62.2 in | Wt 148.6 lb

## 2022-01-14 DIAGNOSIS — Z0000 Encounter for general adult medical examination without abnormal findings: Secondary | ICD-10-CM | POA: Diagnosis not present

## 2022-01-14 NOTE — Patient Instructions (Signed)
[name] ,  Thank you for taking time to come for your Medicare Wellness Visit. I appreciate your ongoing commitment to your health goals. Please review the following plan we discussed and let me know if I can assist you in the future.   Screening recommendations/referrals: Colonoscopy: requesting report from [facility]: completed 08/06/2021, due 08/08/2022 Bone Density: completed 11/29/2020 Recommended yearly ophthalmology/optometry visit for glaucoma screening and checkup Recommended yearly dental visit for hygiene and checkup  Vaccinations: Influenza vaccine: due Pneumococcal vaccine: completed 05/08/2016 Tdap vaccine: completed 04/13/2014, due 04/13/2024 Shingles vaccine: discussed   Covid-19: 07/20/2020, 09/05/2019, 08/15/2019  Advanced directives: Please bring a copy of your POA (Power of Attorney) and/or Living Will to your next appointment.    Conditions/risks identified: none  Next appointment: Follow up in one year for your annual wellness visit    Preventive Care 65 Years and Older, Female Preventive care refers to lifestyle choices and visits with your health care provider that can promote health and wellness. What does preventive care include? A yearly physical exam. This is also called an annual well check. Dental exams once or twice a year. Routine eye exams. Ask your health care provider how often you should have your eyes checked. Personal lifestyle choices, including: Daily care of your teeth and gums. Regular physical activity. Eating a healthy diet. Avoiding tobacco and drug use. Limiting alcohol use. Practicing safe sex. Taking low-dose aspirin every day. Taking vitamin and mineral supplements as recommended by your health care provider. What happens during an annual well check? The services and screenings done by your health care provider during your annual well check will depend on your age, overall health, lifestyle risk factors, and family history  of disease. Counseling  Your health care provider may ask you questions about your: Alcohol use. Tobacco use. Drug use. Emotional well-being. Home and relationship well-being. Sexual activity. Eating habits. History of falls. Memory and ability to understand (cognition). Work and work Statistician. Reproductive health. Screening  You may have the following tests or measurements: Height, weight, and BMI. Blood pressure. Lipid and cholesterol levels. These may be checked every 5 years, or more frequently if you are over 50 years old. Skin check. Lung cancer screening. You may have this screening every year starting at age 57 if you have a 30-pack-year history of smoking and currently smoke or have quit within the past 15 years. Fecal occult blood test (FOBT) of the stool. You may have this test every year starting at age 42. Flexible sigmoidoscopy or colonoscopy. You may have a sigmoidoscopy every 5 years or a colonoscopy every 10 years starting at age 78. Hepatitis C blood test. Hepatitis B blood test. Sexually transmitted disease (STD) testing. Diabetes screening. This is done by checking your blood sugar (glucose) after you have not eaten for a while (fasting). You may have this done every 1-3 years. Bone density scan. This is done to screen for osteoporosis. You may have this done starting at age 88. Mammogram. This may be done every 1-2 years. Talk to your health care provider about how often you should have regular mammograms. Talk with your health care provider about your test results, treatment options, and if necessary, the need for more tests. Vaccines  Your health care provider may recommend certain vaccines, such as: Influenza vaccine. This is recommended every year. Tetanus, diphtheria, and acellular pertussis (Tdap, Td) vaccine. You may need a Td booster every 10 years. Zoster vaccine. You may need this after age 92. Pneumococcal  13-valent conjugate (PCV13) vaccine. One  dose is recommended after age 26. Pneumococcal polysaccharide (PPSV23) vaccine. One dose is recommended after age 81. Talk to your health care provider about which screenings and vaccines you need and how often you need them. This information is not intended to replace advice given to you by your health care provider. Make sure you discuss any questions you have with your health care provider. Document Released: 06/14/2015 Document Revised: 02/05/2016 Document Reviewed: 03/19/2015 Elsevier Interactive Patient Education  2017 [employer] Prevention in the Home Falls can cause injuries. They can happen to people of all ages. There are many things you can do to make your home safe and to help prevent falls. What can I do on the outside of my home? Regularly fix the edges of walkways and driveways and fix any cracks. Remove anything that might make you trip as you walk through a door, such as a raised step or threshold. Trim any bushes or trees on the path to your home. Use bright outdoor lighting. Clear any walking paths of anything that might make someone trip, such as rocks or tools. Regularly check to see if handrails are loose or broken. Make sure that both sides of any steps have handrails. Any raised decks and porches should have guardrails on the edges. Have any leaves, snow, or ice cleared regularly. Use sand or salt on walking paths during winter. Clean up any spills in your garage right away. This includes oil or grease spills. What can I do in the bathroom? Use night lights. Install grab bars by the toilet and in the tub and shower. Do not use towel bars as grab bars. Use non-skid mats or decals in the tub or shower. If you need to sit down in the shower, use a plastic, non-slip stool. Keep the floor dry. Clean up any water that spills on the floor as soon as it happens. Remove soap buildup in the tub or shower regularly. Attach bath mats securely with double-sided  non-slip rug tape. Do not have throw rugs and other things on the floor that can make you trip. What can I do in the bedroom? Use night lights. Make sure that you have a light by your bed that is easy to reach. Do not use any sheets or blankets that are too big for your bed. They should not hang down onto the floor. Have a firm chair that has side arms. You can use this for support while you get dressed. Do not have throw rugs and other things on the floor that can make you trip. What can I do in the kitchen? Clean up any spills right away. Avoid walking on wet floors. Keep items that you use a lot in easy-to-reach places. If you need to reach something above you, use a strong step stool that has a grab bar. Keep electrical cords out of the way. Do not use floor polish or wax that makes floors slippery. If you must use wax, use non-skid floor wax. Do not have throw rugs and other things on the floor that can make you trip. What can I do with my stairs? Do not leave any items on the stairs. Make sure that there are handrails on both sides of the stairs and use them. Fix handrails that are broken or loose. Make sure that handrails are as long as the stairways. Check any carpeting to make sure that it is firmly attached to the stairs. Fix any carpet  that is loose or worn. Avoid having throw rugs at the top or bottom of the stairs. If you do have throw rugs, attach them to the floor with carpet tape. Make sure that you have a light switch at the top of the stairs and the bottom of the stairs. If you do not have them, ask someone to add them for you. What else can I do to help prevent falls? Wear shoes that: Do not have high heels. Have rubber bottoms. Are comfortable and fit you well. Are closed at the toe. Do not wear sandals. If you use a stepladder: Make sure that it is fully opened. Do not climb a closed stepladder. Make sure that both sides of the stepladder are locked into place. Ask  someone to hold it for you, if possible. Clearly mark and make sure that you can see: Any grab bars or handrails. First and last steps. Where the edge of each step is. Use tools that help you move around (mobility aids) if they are needed. These include: Canes. Walkers. Scooters. Crutches. Turn on the lights when you go into a dark area. Replace any light bulbs as soon as they burn out. Set up your furniture so you have a clear path. Avoid moving your furniture around. If any of your floors are uneven, fix them. If there are any pets around you, be aware of where they are. Review your medicines with your doctor. Some medicines can make you feel dizzy. This can increase your chance of falling. Ask your doctor what other things that you can do to help prevent falls. This information is not intended to replace advice given to you by your health care provider. Make sure you discuss any questions you have with your health care provider. Document Released: 03/14/2009 Document Revised: 10/24/2015 Document Reviewed: 06/22/2014 Elsevier Interactive Patient Education  2017 [employer].

## 2022-01-14 NOTE — Progress Notes (Signed)
Subjective:   Deanna [name] is a 76 y.o. female who presents for Medicare Annual (Subsequent) preventive examination.  Review of Systems     Cardiac Risk Factors include: advanced age (>39mn, >>52women);dyslipidemia;hypertension     Objective:    Today's Vitals   01/14/22 1142  BP: 122/60  Pulse: 77  Temp: 98 F (36.7 C)  TempSrc: Oral  SpO2: 94%  Weight: 148 lb 9.6 oz (67.4 kg)  Height: 5' 2.2" (1.58 m)   Body mass index is 27 kg/m.     01/14/2022   11:50 AM 01/09/2022    8:06 PM 03/12/2021   10:46 AM 02/26/2021    5:19 PM 02/16/2021    2:05 PM 02/15/2021    8:34 PM 01/20/2021   11:41 AM  Advanced Directives  Does Patient Have a Medical Advance Directive? Yes No No No No No Yes  [place]      Living will  Does patient want to make changes to medical advance directive?       No - Patient declined  Copy of [place] Chart? No - copy requested        Would patient like information on creating a medical advance directive?  No - Patient declined No - Patient declined No - Patient declined No - Patient declined      Current Medications (verified) Outpatient Encounter Medications as of 01/14/2022  Medication Sig   amLODipine (NORVASC) 10 MG tablet TAKE 1 TABLET BY MOUTH EVERY DAY   Ascorbic Acid (VITAMIN C) 1000 MG tablet Take 2,000 mg by mouth daily.   Calcium-Magnesium (CAL-MAG PO) Take 2,000 mg by mouth at bedtime. 1000 mg   Carboxymethylcellul-Glycerin (CLEAR EYES FOR DRY EYES OP) Place 1 drop into both eyes daily.   Cholecalciferol (VITAMIN D PO) Take 2,000 Units by mouth daily.   [employer] MAGNESIUM OXIDE 250 MG TABS TAKE 1 TABLET (250 MG TOTAL) BY MOUTH EVERY EVENING. TAKE WITH EVENING MEAL   fexofenadine (ALLEGRA) 180 MG tablet Take 1 tablet (180 mg total) by mouth daily.   hydrochlorothiazide (HYDRODIURIL) 25 MG tablet Take 1 tablet (25 mg total) by mouth 2 (two) times daily.   losartan  (COZAAR) 50 MG tablet Take 1 tablet (50 mg total) by mouth daily.   milk thistle 175 MG tablet Take 175 mg by mouth daily.   mometasone (NASONEX) 50 MCG/ACT nasal spray PLACE 2 SPRAYS INTO THE NOSE DAILY.   OVER THE COUNTER MEDICATION Take 2,000 mg by mouth daily. Pau-D'arco 1000 mg each   Probiotic Product (PROBIOTIC DAILY PO) Take 1 tablet by mouth daily.   Vitamin A 2400 MCG (8000 UT) CAPS Take 16,000 Units by mouth daily. 8000 units   amoxicillin-clavulanate (AUGMENTIN) 500-125 MG tablet Take 1 tablet (500 mg total) by mouth 3 (three) times daily. (Patient not taking: Reported on 01/14/2022)   atorvastatin (LIPITOR) 10 MG tablet Take 1 tab by mouth MWF (Patient not taking: Reported on 01/14/2022)   dexlansoprazole (DEXILANT) 60 MG capsule Take 1 capsule (60 mg total) by mouth daily. (Patient not taking: Reported on 01/14/2022)   hydrocortisone cream 1 % Apply to affected area 2 times daily   polyethylene glycol (MIRALAX / GLYCOLAX) 17 g packet Take 17 g by mouth daily as needed for mild constipation. (Patient not taking: Reported on 01/14/2022)   No facility-administered encounter medications on file as of 01/14/2022.    Allergies (verified) Codeine, Gluten meal, Dust mite  extract, Gabapentin, Sulfa antibiotics, and Terbinafine hcl   History: Past Medical History:  Diagnosis Date   Abnormal glucose 03/06/2020   Age-related osteoporosis without current pathological fracture 11/22/2015   Overview:  Femoral neck  Formatting of this note might be different from the original. 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 Added automatically from request for surgery [id number]    Chronic pain syndrome 04/01/2015   Colon polyps    Essential hypertension 07/20/2000   Overview:  HBP   Female cystocele 02/27/2009   GAD (generalized anxiety disorder) 08/19/2015   GERD (gastroesophageal reflux disease)    History of Bell's palsy 03/21/2015   Hx of adenomatous colonic polyps 12/20/2008   Overview:  Colonoscopy 07/2003 - [facility] - polyps removed - recommended repeat in 3 years Colonoscopy 09/2011 - Dr. Marian Sorrow - hyperplastic polyp(s) were removed - repeat in 5 years   Hypertension    Incomplete uterovaginal prolapse 03/04/2004   Insomnia    Left bundle-branch block 05/20/2006   Lumbar radiculopathy 01/09/2016   Formatting of this note might be different from the original. 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 Femoral neck   Other intervertebral disc degeneration, lumbar region 01/29/2015   Pre-operative cardiovascular examination 09/03/2020   Primary osteoarthritis, right hand 02/28/2002   Overview:  OA HANDS, ESP THUMBS   Radial tunnel syndrome, right 11/04/2016   Radiculopathy, lumbar region 11/22/2014   Overview:  Added automatically from request for surgery [id number]   Rhinitis, allergic 02/24/2002   Overview:  ALLERGY SYMPTOMS - states was seen by allergist in past and informed allergic to dust   S/P left total hip arthroplasty 09/17/2020   Sacroiliitis ([place]) 08/02/2019   Screening for colon cancer 04/17/2005   Formatting of this note might be different from the original. 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 TRIGGER THUMBS - started spring 2003. Injected 02/28/02   Unspecified asthma, uncomplicated 7/86/7672   Past Surgical History:  Procedure Laterality Date   ABDOMINAL HYSTERECTOMY  2010   [employer]   SPINAL FUSION  01/2016   TONSILLECTOMY AND ADENOIDECTOMY  1956   TOTAL HIP ARTHROPLASTY Left 09/17/2020   Procedure: TOTAL HIP ARTHROPLASTY ANTERIOR APPROACH;  Surgeon: Paralee Cancel, MD;  Location: [facility];  Service: Orthopedics;  Laterality: Left;  70 mins   Family History  Problem Relation Age of Onset   Arthritis Mother    Diabetes Mother    Pancreatic cancer Mother 23   Early death Father    Leukemia Father  Leukemia Sister    Early death Sister    Diabetes Sister    Social History   Socioeconomic History   Marital status: Divorced    Spouse name: Not on file   Number of children: 2   Years of education: 2 years college   Highest education level: Not on file  Occupational History   Occupation: Retired  Tobacco Use   Smoking status: Former    Packs/day: 0.50    Years: 30.00    Total pack years: 15.00    Types: Cigarettes    Quit date: 12/31/1997    Years since quitting: 24.0   Smokeless tobacco: Never   Tobacco comments:    Quit in 1999  Vaping Use   Vaping Use: Never used  Substance and Sexual Activity   Alcohol use: Not Currently   Drug use: No   Sexual activity: Not Currently    Birth control/protection: Post-menopausal  Other Topics Concern   Not on file  Social History Narrative   Lives alone.   Right-handed.   One cup caffeine per day.   Social Determinants of Health   Financial Resource Strain: High Risk (01/14/2022)   Overall Financial Resource Strain (CARDIA)    Difficulty of Paying Living Expenses: Hard  Food Insecurity: No Food Insecurity  (01/14/2022)   Hunger Vital Sign    Worried About Running Out of Food in the Last Year: Never true    Ran Out of Food in the Last Year: Never true  Transportation Needs: No Transportation Needs (01/14/2022)   PRAPARE - Hydrologist (Medical): No    Lack of Transportation (Non-Medical): No  Physical Activity: Inactive (01/14/2022)   Exercise Vital Sign    Days of Exercise per Week: 0 days    Minutes of Exercise per Session: 0 min  Stress: Stress Concern Present (01/14/2022)   [place]    Feeling of Stress : To some extent  Social Connections: Not on file    Tobacco Counseling Counseling given: Not Answered Tobacco comments: Quit in 1999   Clinical Intake:  Pre-visit preparation completed: Yes  Pain : No/denies pain     Nutritional Status: BMI 25 -29 Overweight Nutritional Risks: None Diabetes: No  How often do you need to have someone help you when you read instructions, pamphlets, or other written materials from your doctor or pharmacy?: 1 - Never What is the last grade level you completed in school?: [employer]  Diabetic? no  Interpreter Needed?: No  Information entered by :: NAllen LPN   Activities of Daily Living    01/14/2022   11:52 AM  In your present state of health, do you have any difficulty performing the following activities:  Hearing? 1  Comment some hearing loss  Vision? 0  Difficulty concentrating or making decisions? 0  Walking or climbing stairs? 1  Dressing or bathing? 0  Doing errands, shopping? 0  Preparing Food and eating ? N  Using the Toilet? N  In the past six months, have you accidently leaked urine? N  Do you have problems with loss of bowel control? N  Managing your Medications? N  Managing your Finances? N  Housekeeping or managing your Housekeeping? N    Patient Care Team: [name], FNP as PCP - General (General  Practice)  Indicate any recent Medical Services you may have received from other than [facility] providers in the past year (date may be approximate).  Assessment:   This is a routine wellness examination for Deanna [name].  Hearing/Vision screen Vision Screening - Comments:: Regular eye exams, [facility]  Dietary issues and exercise activities discussed: Current Exercise Habits: The patient does not participate in regular exercise at present   Goals Addressed             This Visit's Progress    Patient Stated       01/14/2022, working on counting carbs       Depression Screen    01/14/2022   11:51 AM 12/26/2020   12:32 PM 12/21/2019    2:18 PM 08/14/2019   11:37 AM 07/05/2019   10:52 AM  PHQ 2/9 Scores  PHQ - 2 Score 0 0 0 0 0    Fall Risk    01/14/2022   11:51 AM 12/26/2020   12:32 PM 12/21/2019    2:18 PM 08/14/2019   11:37 AM 07/05/2019   10:53 AM  [place] in the past year? 0 0 0 0 0  Number falls in past yr: 0    0  Injury with Fall? 0    0  Risk for fall due to : Medication side effect Medication side effect     Follow up Falls evaluation completed;Education provided;Falls prevention discussed Falls evaluation completed;Education provided;Falls prevention discussed Falls evaluation completed;Education provided;Falls prevention discussed  Falls evaluation completed    FALL RISK PREVENTION PERTAINING TO THE HOME:  Any stairs in or around the home? No  If so, are there any without handrails? N/a Home free of loose throw rugs in walkways, pet beds, electrical cords, etc? Yes  Adequate lighting in your home to reduce risk of falls? Yes   ASSISTIVE DEVICES UTILIZED TO PREVENT FALLS:  Life alert? No  Use of a cane, walker or w/c? No  Grab bars in the bathroom? No  Shower chair or bench in shower? Yes  Elevated toilet seat or a handicapped toilet? Yes   TIMED UP AND GO:  Was the test performed? No .    Gait steady and fast without use of assistive  device  Cognitive Function:        01/14/2022   11:54 AM 12/21/2019    2:21 PM  6CIT Screen  What Year? 0 points 0 points  What month? 0 points 0 points  What time? 0 points 0 points  Count back from 20 0 points 0 points  Months in reverse 0 points 0 points  Repeat phrase 0 points 0 points  Total Score 0 points 0 points    Immunizations Immunization History  Administered Date(s) Administered   Influenza Split 05/20/2006   Influenza, High Dose Seasonal PF 05/20/2006   PFIZER(Purple Top)SARS-COV-2 Vaccination 08/15/2019, 09/05/2019, 07/20/2020   Pneumococcal Conjugate-13 05/31/2013, 05/31/2013   Pneumococcal Polysaccharide-23 05/08/2016   Td 03/10/2004   Tdap 03/10/2004, 04/13/2014, 04/13/2014    TDAP status: Up to date  Flu Vaccine status: Declined, Education has been provided regarding the importance of this vaccine but patient still declined. Advised may receive this vaccine at local pharmacy or Health Dept. Aware to provide a copy of the vaccination record if obtained from local pharmacy or Health Dept. Verbalized acceptance and understanding.  Pneumococcal vaccine status: Up to date  Covid-19 vaccine status: Completed vaccines  Qualifies for Shingles Vaccine? Yes   Zostavax completed No   Shingrix Completed?: No.    Education has been provided regarding the importance of this vaccine. Patient has been advised to call insurance  company to determine out of pocket expense if they have not yet received this vaccine. Advised may also receive vaccine at local pharmacy or Health Dept. Verbalized acceptance and understanding.  Screening Tests Health Maintenance  Topic Date Due   COLONOSCOPY (Pts 45-56yr Insurance coverage will need to be confirmed)  Never done   COVID-19 Vaccine (4 - Pfizer risk series) 09/14/2020   INFLUENZA VACCINE  12/30/2021   Zoster Vaccines- Shingrix (1 of 2) 01/30/2022 (Originally 04/08/1965)   MAMMOGRAM  08/07/2022   TETANUS/TDAP  04/13/2024    Pneumonia Vaccine 75 Years old  Completed   DEXA SCAN  Completed   Hepatitis C Screening  Completed   HPV VACCINES  Aged Out    Health Maintenance  Health Maintenance Due  Topic Date Due   COLONOSCOPY (Pts 45-463yrInsurance coverage will need to be confirmed)  Never done   COVID-19 Vaccine (4 - Pfizer risk series) 09/14/2020   INFLUENZA VACCINE  12/30/2021    Colorectal cancer screening: completed 2018-2019 per patient  Mammogram status: Completed 08/06/2021. Repeat every year  Bone Density status: Completed 11/29/2020.  Lung Cancer Screening: (Low Dose CT Chest recommended if Age 75-80ears, 30 pack-year currently smoking OR have quit w/in 15years.) does not qualify.   Lung Cancer Screening Referral: no  Additional Screening:  Hepatitis C Screening: does qualify; Completed 01/29/2021  Vision Screening: Recommended annual ophthalmology exams for early detection of glaucoma and other disorders of the eye. Is the patient up to date with their annual eye exam?  Yes  Who is the provider or what is the name of the office in which the patient attends annual eye exams? [facility] is not established with a provider, would they like to be referred to a provider to establish care? No .   Dental Screening: Recommended annual dental exams for proper oral hygiene  Community Resource Referral / Chronic Care Management: CRR required this visit?  No   CCM required this visit?  No      Plan:     I have personally reviewed and noted the following in the patient's chart:   Medical and social history Use of alcohol, tobacco or illicit drugs  Current medications and supplements including opioid prescriptions.  Functional ability and status Nutritional status Physical activity Advanced directives List of other physicians Hospitalizations, surgeries, and ER visits in previous 12 months Vitals Screenings to include cognitive, depression, and falls Referrals and  appointments  In addition, I have reviewed and discussed with patient certain preventive protocols, quality metrics, and best practice recommendations. A written personalized care plan for preventive services as well as general preventive health recommendations were provided to patient.     Deanna SimmeringLPN   01/03/62/1497 Nurse Notes: none

## 2022-01-26 ENCOUNTER — Other Ambulatory Visit: Payer: Self-pay | Admitting: Nurse Practitioner

## 2022-03-18 ENCOUNTER — Ambulatory Visit: Payer: MEDICARE | Admitting: Nurse Practitioner

## 2022-04-21 ENCOUNTER — Other Ambulatory Visit: Payer: Self-pay | Admitting: Nurse Practitioner

## 2022-06-20 ENCOUNTER — Emergency Department (HOSPITAL_BASED_OUTPATIENT_CLINIC_OR_DEPARTMENT_OTHER): Payer: MEDICARE

## 2022-06-20 ENCOUNTER — Other Ambulatory Visit: Payer: Self-pay

## 2022-06-20 ENCOUNTER — Emergency Department (HOSPITAL_BASED_OUTPATIENT_CLINIC_OR_DEPARTMENT_OTHER)
Admission: EM | Admit: 2022-06-20 | Discharge: 2022-06-21 | Disposition: A | Discharge status: Home / Self Care | Payer: MEDICARE | Attending: Emergency Medicine | Admitting: Emergency Medicine

## 2022-06-20 ENCOUNTER — Encounter (HOSPITAL_BASED_OUTPATIENT_CLINIC_OR_DEPARTMENT_OTHER): Payer: Self-pay | Admitting: Emergency Medicine

## 2022-06-20 DIAGNOSIS — Z96642 Presence of left artificial hip joint: Secondary | ICD-10-CM | POA: Insufficient documentation

## 2022-06-20 DIAGNOSIS — X58XXXA Exposure to other specified factors, initial encounter: Secondary | ICD-10-CM | POA: Diagnosis not present

## 2022-06-20 DIAGNOSIS — Z87891 Personal history of nicotine dependence: Secondary | ICD-10-CM | POA: Insufficient documentation

## 2022-06-20 DIAGNOSIS — Y9301 Activity, walking, marching and hiking: Secondary | ICD-10-CM | POA: Diagnosis not present

## 2022-06-20 DIAGNOSIS — Z7951 Long term (current) use of inhaled steroids: Secondary | ICD-10-CM | POA: Diagnosis not present

## 2022-06-20 DIAGNOSIS — S299XXA Unspecified injury of thorax, initial encounter: Secondary | ICD-10-CM | POA: Diagnosis present

## 2022-06-20 DIAGNOSIS — R42 Dizziness and giddiness: Secondary | ICD-10-CM | POA: Diagnosis not present

## 2022-06-20 DIAGNOSIS — J45909 Unspecified asthma, uncomplicated: Secondary | ICD-10-CM | POA: Diagnosis not present

## 2022-06-20 DIAGNOSIS — I10 Essential (primary) hypertension: Secondary | ICD-10-CM | POA: Diagnosis not present

## 2022-06-20 DIAGNOSIS — R55 Syncope and collapse: Secondary | ICD-10-CM

## 2022-06-20 DIAGNOSIS — Z79899 Other long term (current) drug therapy: Secondary | ICD-10-CM | POA: Diagnosis not present

## 2022-06-20 LAB — CBG MONITORING, ED: Glucose-Capillary: 136 mg/dL — ABNORMAL HIGH (ref 70–99)

## 2022-06-20 LAB — CBC
HCT: 42.2 % (ref 36.0–46.0)
Hemoglobin: 14.2 g/dL (ref 12.0–15.0)
MCH: 28.5 pg (ref 26.0–34.0)
MCHC: 33.6 g/dL (ref 30.0–36.0)
MCV: 84.7 fL (ref 80.0–100.0)
Platelets: 280 10*3/uL (ref 150–400)
RBC: 4.98 MIL/uL (ref 3.87–5.11)
RDW: 13.6 % (ref 11.5–15.5)
WBC: 8.8 10*3/uL (ref 4.0–10.5)
nRBC: 0 % (ref 0.0–0.2)

## 2022-06-20 LAB — BASIC METABOLIC PANEL
Anion gap: 10 (ref 5–15)
BUN: 19 mg/dL (ref 8–23)
CO2: 26 mmol/L (ref 22–32)
Calcium: 9.4 mg/dL (ref 8.9–10.3)
Chloride: 97 mmol/L — ABNORMAL LOW (ref 98–111)
Creatinine, Ser: 0.66 mg/dL (ref 0.44–1.00)
GFR, Estimated: >60 mL/min (ref >60)
Glucose, Bld: 106 mg/dL — ABNORMAL HIGH (ref 70–99)
Potassium: 3.4 mmol/L — ABNORMAL LOW (ref 3.5–5.1)
Sodium: 133 mmol/L — ABNORMAL LOW (ref 135–145)

## 2022-06-20 MED ORDER — FENTANYL CITRATE PF 50 MCG/ML IJ SOSY
50.0000 ug | PREFILLED_SYRINGE | Freq: Once | INTRAMUSCULAR | Status: AC
  Administered 2022-06-21: 25 ug via INTRAVENOUS
  Filled 2022-06-20: qty 1

## 2022-06-20 NOTE — ED Provider Notes (Signed)
[facility] Provider Note: Georgena Spurling, MD, FACEP  CSN: [id number] MRN: [id number] ARRIVAL: 06/20/22 at 2042 ROOM: [place]  Syncope   HISTORY OF PRESENT ILLNESS  06/20/22 11:01 PM Deanna [name] is a 77 y.o. female who was walking up stairs to her apartment just prior to arrival.  She states this is a distance of about 17 steps.  As she got to her apartment door she felt lightheaded and leaned against the wall.  The next thing she remembers is waking up on the floor.  He did not immediately have pain but subsequently noticed sharp pain in her right lower anterior ribs which she rates as a 9 out of 10.  This pain is worse with movement or palpation.  She thinks she hit her head on the ground but the decking was carpeted and she is not having significant head pain.  She is not on anticoagulation.  She has a known left bundle branch block and known PACs.  She denies any chest pain or shortness of breath.   Past Medical History:  Diagnosis Date   Abnormal glucose 03/06/2020   Age-related osteoporosis without current pathological fracture 11/22/2015   Overview:  Femoral neck  Formatting of this note might be different from the original. 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 Added automatically from request for surgery [id number]   Chronic pain syndrome 04/01/2015   Colon polyps    Essential hypertension 07/20/2000   Overview:  HBP   Female cystocele 02/27/2009   GAD (generalized anxiety disorder) 08/19/2015   GERD (gastroesophageal reflux disease)    History of Bell's palsy 03/21/2015   Hx of adenomatous colonic polyps  12/20/2008   Overview:  Colonoscopy 07/2003 - [facility] - polyps removed - recommended repeat in 3 years Colonoscopy 09/2011 - Dr. Marian Sorrow - hyperplastic polyp(s) were removed - repeat in 5 years   Hypertension    Incomplete uterovaginal prolapse 03/04/2004   Insomnia    Left bundle-branch block 05/20/2006   Lumbar radiculopathy 01/09/2016   Formatting of this note might be different from the original. 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 Femoral neck   Other intervertebral disc degeneration, lumbar region 01/29/2015   Pre-operative cardiovascular examination 09/03/2020   Primary osteoarthritis, right hand 02/28/2002   Overview:  OA HANDS, ESP THUMBS   Radial tunnel syndrome, right 11/04/2016   Radiculopathy, lumbar region 11/22/2014   Overview:  Added automatically from request for surgery [id number]   Rhinitis, allergic 02/24/2002   Overview:  ALLERGY SYMPTOMS - states was seen by allergist in past and informed allergic to dust   S/P left total hip arthroplasty 09/17/2020   Sacroiliitis ([place]) 08/02/2019   Screening for colon cancer 04/17/2005   Formatting of this note might be different from the original. 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 TRIGGER THUMBS - started spring 2003. Injected 02/28/02   Unspecified asthma, uncomplicated 0/45/4098    Past Surgical  History:  Procedure Laterality Date   ABDOMINAL HYSTERECTOMY  2010   [place]   SPINAL FUSION  01/2016   TONSILLECTOMY AND ADENOIDECTOMY  1956   TOTAL HIP ARTHROPLASTY Left 09/17/2020   Procedure: TOTAL HIP ARTHROPLASTY ANTERIOR APPROACH;  Surgeon: Paralee Cancel, MD;  Location: [facility];  Service: Orthopedics;  Laterality: Left;  70 mins    Family History  Problem Relation Age of Onset   Arthritis Mother    Diabetes Mother    Pancreatic cancer Mother 46   Early death Father    Leukemia Father    Leukemia Sister    Early death Sister    Diabetes Sister     Social History   Tobacco Use   Smoking status: Former    Packs/day: 0.50    Years: 30.00    Total pack years: 15.00    Types: Cigarettes    Quit date: 12/31/1997    Years since quitting: 24.4   Smokeless tobacco: Never   Tobacco comments:    Quit in 1999  Vaping Use   Vaping Use: Never used  Substance Use Topics   Alcohol use: Not Currently   Drug use: No    Prior to Admission medications   Medication Sig Start Date End Date Taking? Authorizing Provider  amLODipine (NORVASC) 10 MG tablet TAKE 1 TABLET BY MOUTH EVERY DAY 11/17/21   [name], FNP  Ascorbic Acid (VITAMIN C) 1000 MG tablet Take 2,000 mg by mouth daily.    [provider]  Calcium-Magnesium (CAL-MAG PO) Take 2,000 mg by mouth at bedtime. 1000 mg    [provider]  Carboxymethylcellul-Glycerin (CLEAR EYES FOR DRY EYES OP) Place 1 drop into both eyes daily.    [provider]  Cholecalciferol (VITAMIN D PO) Take 2,000 Units by mouth daily.    [provider]  [employer] MAGNESIUM OXIDE 250 MG TABS TAKE 1 TABLET (250 MG TOTAL) BY MOUTH EVERY EVENING. TAKE WITH EVENING MEAL 09/15/21   [name], FNP  fexofenadine (ALLEGRA) 180 MG tablet Take 1 tablet (180 mg total) by mouth daily. 10/30/21   [name], FNP  hydrochlorothiazide (HYDRODIURIL) 25 MG tablet Take 1 tablet (25 mg total) by mouth  2 (two) times daily. 10/07/20   [name], FNP  hydrocortisone cream 1 % Apply to affected area 2 times daily 12/01/21 12/01/22  [name], FNP  losartan (COZAAR) 50 MG tablet TAKE 1 TABLET BY MOUTH EVERY DAY 04/22/22   [name], FNP  milk thistle 175 MG tablet Take 175 mg by mouth daily.    [provider]  mometasone (NASONEX) 50 MCG/ACT nasal spray PLACE 2 SPRAYS INTO THE NOSE DAILY. 11/17/21 11/17/22  [name], FNP  OVER THE COUNTER MEDICATION Take 2,000 mg by mouth daily. Pau-D'arco 1000 mg each    [provider]  Probiotic Product (PROBIOTIC DAILY PO) Take 1 tablet by mouth daily.    [provider]  Vitamin A 2400 MCG (8000 UT) CAPS Take 16,000 Units by mouth daily. 8000 units  [provider]    Allergies Codeine, Gluten meal, Dust mite extract, Gabapentin, Sulfa antibiotics, and Terbinafine hcl   REVIEW OF SYSTEMS  Negative except as noted here or in the History of Present Illness.   PHYSICAL EXAMINATION  Initial Vital Signs Blood pressure (!) 127/107, pulse 84, temperature 97.6 F (36.4 C), temperature source Oral, resp. rate (!) 26, height '5\' 2"'$  (1.575 m), weight 64.9 kg, SpO2 96 %.  Examination General: Well-developed, well-nourished female in no acute distress; appearance consistent with age of record HENT: normocephalic; atraumatic Eyes: pupils equal, round and reactive to light; extraocular muscles intact Neck: supple Heart: regular rate and rhythm; frequent PACs Lungs: clear to auscultation bilaterally Chest: Right lower anterior chest tenderness Abdomen: soft; nondistended; nontender; bowel sounds present Extremities: No deformity; full range of motion; pulses normal Neurologic: Awake, alert and oriented; motor function intact in all extremities and symmetric; no facial droop Skin: Warm and dry Psychiatric: Normal mood and affect   RESULTS  Summary of this visit's results, reviewed and interpreted by myself:    EKG Interpretation  Date/Time:  Saturday June 20 2022 20:52:15 EST Ventricular Rate:  90 PR Interval:  198 QRS Duration: 132 QT Interval:  410 QTC Calculation: 502 R Axis:   -18 Text Interpretation: Sinus rhythm LAE, consider biatrial enlargement Left bundle branch block No significant change was found Confirmed by Shetara Launer, Jenny Reichmann [phone]) on 06/20/2022 10:51:53 PM       Laboratory Studies: Results for orders placed or performed during the hospital encounter of 06/20/22 (from the past 24 hour(s))  Basic metabolic panel     Status: Abnormal   Collection Time: 06/20/22  9:41 PM  Result Value Ref Range   Sodium 133 (L) 135 - 145 mmol/L   Potassium 3.4 (L) 3.5 - 5.1 mmol/L   Chloride 97 (L) 98 - 111 mmol/L   CO2 26 22 - 32 mmol/L   Glucose, Bld 106 (H) 70 - 99 mg/dL   BUN 19 8 - 23 mg/dL   Creatinine, Ser 0.66 0.44 - 1.00 mg/dL   Calcium 9.4 8.9 - 10.3 mg/dL   GFR, Estimated >60 >60 mL/min   Anion gap 10 5 - 15  CBC     Status: None   Collection Time: 06/20/22  9:41 PM  Result Value Ref Range   WBC 8.8 4.0 - 10.5 K/uL   RBC 4.98 3.87 - 5.11 MIL/uL   Hemoglobin 14.2 12.0 - 15.0 g/dL   HCT 42.2 36.0 - 46.0 %   MCV 84.7 80.0 - 100.0 fL   MCH 28.5 26.0 - 34.0 pg   MCHC 33.6 30.0 - 36.0 g/dL   RDW 13.6 11.5 - 15.5 %   Platelets 280 150 - 400 K/uL   nRBC 0.0 0.0 - 0.2 %  CBG monitoring, ED     Status: Abnormal   Collection Time: 06/20/22 10:09 PM  Result Value Ref Range   Glucose-Capillary 136 (H) 70 - 99 mg/dL  Urinalysis, Routine w reflex microscopic Urine, Clean Catch     Status: Abnormal   Collection Time: 06/20/22 11:39 PM  Result Value Ref Range   Color, Urine YELLOW YELLOW   APPearance CLEAR CLEAR   Specific Gravity, Urine 1.010 1.005 - 1.030   pH 6.5 5.0 - 8.0   Glucose, UA NEGATIVE NEGATIVE mg/dL   Hgb urine dipstick NEGATIVE NEGATIVE   Bilirubin Urine NEGATIVE NEGATIVE   Ketones, ur NEGATIVE NEGATIVE mg/dL   Protein, ur NEGATIVE NEGATIVE mg/dL   Nitrite  NEGATIVE  NEGATIVE   Leukocytes,Ua SMALL (A) NEGATIVE  Urinalysis, Microscopic (reflex)     Status: Abnormal   Collection Time: 06/20/22 11:39 PM  Result Value Ref Range   RBC / HPF 0-5 0 - 5 RBC/hpf   WBC, UA 6-10 0 - 5 WBC/hpf   Bacteria, UA RARE (A) NONE SEEN   Squamous Epithelial / HPF 0-5 0 - 5 /HPF   Non Squamous Epithelial PRESENT (A) NONE SEEN   Imaging Studies: DG Ribs Unilateral Right  Result Date: 06/21/2022 CLINICAL DATA:  Syncope, right rib pain. EXAM: RIGHT RIBS - 2 VIEW COMPARISON:  06/20/2022. FINDINGS: No fracture or other bone lesions are seen involving the ribs. Mild atelectasis is present at the right lung base. There is atherosclerotic calcification of the aorta. IMPRESSION: 1. No acute displaced right rib fracture. 2. Mild atelectasis at the right lung base. Electronically Signed   By: Brett Fairy M.D.   On: 06/21/2022 00:08   DG Chest 2 View  Result Date: 06/20/2022 CLINICAL DATA:  Rib pain EXAM: CHEST - 2 VIEW COMPARISON:  Chest x-ray 05/21/2022 FINDINGS: The heart size and mediastinal contours are within normal limits. There is some linear atelectasis or scarring in the right mid lung. The lungs are otherwise clear. No pleural effusion or pneumothorax. The visualized skeletal structures are unremarkable. IMPRESSION: No active cardiopulmonary disease. Electronically Signed   By: Ronney Asters M.D.   On: 06/20/2022 23:18    ED COURSE and MDM  Nursing notes, initial and subsequent vitals signs, including pulse oximetry, reviewed and interpreted by myself.  Vitals:   06/20/22 2300 06/20/22 2312 06/21/22 0000 06/21/22 0030  BP: (!) 125/58 (!) 150/68 129/72 139/74  Pulse: 84 86 82 61  Resp: '20 15 17 '$ (!) 27  Temp:      TempSrc:      SpO2: 98% 100% 98% 98%  Weight:      Height:       Medications  fentaNYL (SUBLIMAZE) injection 50 mcg (25 mcg Intravenous Given 06/21/22 0004)   1:20 AM The cause of the patient's syncope is unclear.  The only abnormal rhythm seen  on her rhythm strip was frequent PACs and a single PVC.  There were no sustained irregular beats.  She has been taking NSAIDs for hip pain recently and had a steroid injection in her hip 9 days ago but these would be unlikely causes for a syncopal episode.  No rib fracture was seen on chest x-ray but she could have contused a rib.  She states she smelled a funny odor in her car.  While carpal monoxide is odorless she was advised it could be present along with an odoriferous compound and she should have her car's exhaust evaluated by [employer].   PROCEDURES  Procedures   ED DIAGNOSES     ICD-10-CM   1. Syncope and collapse  R55     2. Traumatic injury of rib  S29.Warnell Forester, MD 06/21/22 [id number]

## 2022-06-20 NOTE — ED Notes (Signed)
To X-ray

## 2022-06-20 NOTE — ED Notes (Signed)
Patient given an ice pack. 

## 2022-06-20 NOTE — ED Triage Notes (Signed)
Pt reports syncopal episode tonight; was talking to a friend on the phone as she was about to enter her apt; friend said pt stopped talking for about 2 min then was able to tell friend she had passed out; pt c/o RT  rib area pain; hit head on ground (carpeted decking); no thinners

## 2022-06-21 DIAGNOSIS — S299XXA Unspecified injury of thorax, initial encounter: Secondary | ICD-10-CM | POA: Diagnosis not present

## 2022-06-21 LAB — URINALYSIS, ROUTINE W REFLEX MICROSCOPIC
Bilirubin Urine: NEGATIVE
Glucose, UA: NEGATIVE mg/dL
Hgb urine dipstick: NEGATIVE
Ketones, ur: NEGATIVE mg/dL
Nitrite: NEGATIVE
Protein, ur: NEGATIVE mg/dL
Specific Gravity, Urine: 1.01 (ref 1.005–1.030)
pH: 6.5 (ref 5.0–8.0)

## 2022-06-21 LAB — URINALYSIS, MICROSCOPIC (REFLEX)

## 2022-06-21 MED ORDER — TRAMADOL HCL 50 MG PO TABS
50.0000 mg | ORAL_TABLET | Freq: Once | ORAL | Status: AC
  Administered 2022-06-21: 50 mg via ORAL
  Filled 2022-06-21: qty 1

## 2022-06-21 MED ORDER — TRAMADOL HCL 50 MG PO TABS: 50.0000 mg | ORAL_TABLET | Freq: Four times a day (QID) | ORAL | 0 refills | Status: DC | PRN

## 2022-06-21 NOTE — ED Notes (Signed)
Patient was given something to drink. Daughter given another blanket. Patient states pain has diminished.

## 2022-06-22 ENCOUNTER — Telehealth: Payer: Self-pay

## 2022-06-22 LAB — URINE CULTURE: Culture: <10000 — AB

## 2022-06-22 NOTE — Telephone Encounter (Signed)
Transition Care Management Follow-up Telephone Call Date of discharge and from where: 06/20/2022  How have you been since you were released from the hospital? Pt states she is doing fine. She feels being evaluated at the ED in depth she does not feel the need to come in unless provider requests she comes in.  Any questions or concerns? No  Items Reviewed: Did the pt receive and understand the discharge instructions provided? Yes  Medications obtained and verified? Yes  Other? Yes  Any new allergies since your discharge? No  Dietary orders reviewed? Yes Do you have support at home? Yes   Home Care and Equipment/Supplies: Were home health services ordered? no If so, what is the name of the agency? N/a  Has the agency set up a time to come to the patient's home? no Were any new equipment or medical supplies ordered?  No What is the name of the medical supply agency? N/a Were you able to get the supplies/equipment? no Do you have any questions related to the use of the equipment or supplies? No  Functional Questionnaire: (I = Independent and D = Dependent) ADLs: i  Bathing/Dressing- i  Meal Prep- i  Eating- i  Maintaining continence- i  Transferring/Ambulation- i  Managing Meds- i  Follow up appointments reviewed:  PCP Hospital f/u appt confirmed? No  Scheduled to see n/ on n/ @ n/a. [facility] f/u appt confirmed? No  Scheduled to see n/a on n/a @ n/a. Are transportation arrangements needed? No  If their condition worsens, is the pt aware to call PCP or go to the Emergency Dept.? Yes Was the patient provided with contact information for the PCP's office or ED? Yes Was to pt encouraged to call back with questions or concerns? Yes

## 2022-09-03 ENCOUNTER — Other Ambulatory Visit: Payer: Self-pay | Admitting: Nurse Practitioner

## 2022-09-03 DIAGNOSIS — I10 Essential (primary) hypertension: Secondary | ICD-10-CM

## 2022-12-12 ENCOUNTER — Emergency Department (HOSPITAL_BASED_OUTPATIENT_CLINIC_OR_DEPARTMENT_OTHER): Payer: MEDICARE

## 2022-12-12 ENCOUNTER — Other Ambulatory Visit: Payer: Self-pay

## 2022-12-12 ENCOUNTER — Encounter (HOSPITAL_BASED_OUTPATIENT_CLINIC_OR_DEPARTMENT_OTHER): Payer: Self-pay

## 2022-12-12 ENCOUNTER — Emergency Department (HOSPITAL_BASED_OUTPATIENT_CLINIC_OR_DEPARTMENT_OTHER)
Admission: EM | Admit: 2022-12-12 | Discharge: 2022-12-12 | Disposition: A | Discharge status: Home / Self Care | Payer: MEDICARE | Attending: Emergency Medicine | Admitting: Emergency Medicine

## 2022-12-12 DIAGNOSIS — J45909 Unspecified asthma, uncomplicated: Secondary | ICD-10-CM | POA: Insufficient documentation

## 2022-12-12 DIAGNOSIS — Z7951 Long term (current) use of inhaled steroids: Secondary | ICD-10-CM | POA: Diagnosis not present

## 2022-12-12 DIAGNOSIS — R0609 Other forms of dyspnea: Secondary | ICD-10-CM | POA: Diagnosis not present

## 2022-12-12 DIAGNOSIS — R0602 Shortness of breath: Secondary | ICD-10-CM | POA: Diagnosis present

## 2022-12-12 DIAGNOSIS — R000 Tachycardia, unspecified: Secondary | ICD-10-CM | POA: Diagnosis not present

## 2022-12-12 DIAGNOSIS — I10 Essential (primary) hypertension: Secondary | ICD-10-CM | POA: Diagnosis not present

## 2022-12-12 DIAGNOSIS — Z85038 Personal history of other malignant neoplasm of large intestine: Secondary | ICD-10-CM | POA: Insufficient documentation

## 2022-12-12 DIAGNOSIS — Z1152 Encounter for screening for COVID-19: Secondary | ICD-10-CM | POA: Diagnosis not present

## 2022-12-12 LAB — CBC WITH DIFFERENTIAL/PLATELET
Abs Immature Granulocytes: 0.05 10*3/uL (ref 0.00–0.07)
Basophils Absolute: 0.1 10*3/uL (ref 0.0–0.1)
Basophils Relative: 1 %
Eosinophils Absolute: 0.2 10*3/uL (ref 0.0–0.5)
Eosinophils Relative: 2 %
HCT: 42.2 % (ref 36.0–46.0)
Hemoglobin: 14.6 g/dL (ref 12.0–15.0)
Immature Granulocytes: 1 %
Lymphocytes Relative: 17 %
Lymphs Abs: 1.4 10*3/uL (ref 0.7–4.0)
MCH: 28.9 pg (ref 26.0–34.0)
MCHC: 34.6 g/dL (ref 30.0–36.0)
MCV: 83.6 fL (ref 80.0–100.0)
Monocytes Absolute: 1.1 10*3/uL — ABNORMAL HIGH (ref 0.1–1.0)
Monocytes Relative: 13 %
Neutro Abs: 5.4 10*3/uL (ref 1.7–7.7)
Neutrophils Relative %: 66 %
Platelets: 291 10*3/uL (ref 150–400)
RBC: 5.05 MIL/uL (ref 3.87–5.11)
RDW: 13.5 % (ref 11.5–15.5)
WBC: 8.3 10*3/uL (ref 4.0–10.5)
nRBC: 0 % (ref 0.0–0.2)

## 2022-12-12 LAB — COMPREHENSIVE METABOLIC PANEL
ALT: 25 U/L (ref 0–44)
AST: 24 U/L (ref 15–41)
Albumin: 4.5 g/dL (ref 3.5–5.0)
Alkaline Phosphatase: 63 U/L (ref 38–126)
Anion gap: 12 (ref 5–15)
BUN: 15 mg/dL (ref 8–23)
CO2: 26 mmol/L (ref 22–32)
Calcium: 9.8 mg/dL (ref 8.9–10.3)
Chloride: 99 mmol/L (ref 98–111)
Creatinine, Ser: 0.7 mg/dL (ref 0.44–1.00)
GFR, Estimated: >60 mL/min (ref >60)
Glucose, Bld: 137 mg/dL — ABNORMAL HIGH (ref 70–99)
Potassium: 3.1 mmol/L — ABNORMAL LOW (ref 3.5–5.1)
Sodium: 137 mmol/L (ref 135–145)
Total Bilirubin: 0.5 mg/dL (ref 0.3–1.2)
Total Protein: 7.5 g/dL (ref 6.5–8.1)

## 2022-12-12 LAB — TROPONIN I (HIGH SENSITIVITY)
Troponin I (High Sensitivity): 28 ng/L — ABNORMAL HIGH (ref <18)
Troponin I (High Sensitivity): 31 ng/L — ABNORMAL HIGH (ref <18)

## 2022-12-12 LAB — SARS CORONAVIRUS 2 BY RT PCR: SARS Coronavirus 2 by RT PCR: NEGATIVE

## 2022-12-12 LAB — BRAIN NATRIURETIC PEPTIDE: B Natriuretic Peptide: 56.1 pg/mL (ref 0.0–100.0)

## 2022-12-12 MED ORDER — ALBUTEROL SULFATE HFA 108 (90 BASE) MCG/ACT IN AERS: 1.0000 | INHALATION_SPRAY | Freq: Four times a day (QID) | RESPIRATORY_TRACT | 0 refills | Status: DC | PRN

## 2022-12-12 MED ORDER — IOHEXOL 350 MG/ML SOLN
75.0000 mL | Freq: Once | INTRAVENOUS | Status: AC | PRN
  Administered 2022-12-12: 75 mL via INTRAVENOUS

## 2022-12-12 MED ORDER — ALBUTEROL SULFATE HFA 108 (90 BASE) MCG/ACT IN AERS: 2.0000 | INHALATION_SPRAY | RESPIRATORY_TRACT | Status: DC | PRN

## 2022-12-12 MED ORDER — IOHEXOL 300 MG/ML  SOLN: 75.0000 mL | Freq: Once | INTRAMUSCULAR | Status: DC | PRN

## 2022-12-12 NOTE — Discharge Instructions (Addendum)
You were seen in the emergency department today with shortness of breath.  I am prescribing an albuterol inhaler to use as needed for sudden worsening symptoms.  I would like for you to follow closely with your primary care doctor to talk about next steps in terms of evaluation for your trouble breathing.  If you develop any new or suddenly worsening symptoms please return to the emergency department for reevaluation.

## 2022-12-12 NOTE — ED Provider Notes (Signed)
Emergency Department Provider Note   I have reviewed the triage vital signs and the nursing notes.   HISTORY  Chief Complaint Shortness of Breath   HPI Deanna [name] is a 77 y.o. female past history reviewed reviewed below presents to the emergency department with shortness of breath.  Symptoms are worse with exertion.  No significant symptoms at rest.  No associated chest pain.  She developed more mild symptoms in March of this year after completing chemo/immunotherapy for lymphoma.  States that she is currently in remission.  No fevers or chills.  She has followed with her cardiologist and completed a course of Lasix thinking that fluid retention may have caused some of her shortness of breath symptoms.  Her leg swelling has decreased but shortness of breath seems to have worsened slightly.    Past Medical History:  Diagnosis Date   Abnormal glucose 03/06/2020   Age-related osteoporosis without current pathological fracture 11/22/2015   Overview:  Femoral neck  Formatting of this note might be different from the original. 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 Added automatically from request for surgery [id number]   Chronic pain syndrome 04/01/2015   Colon polyps    Essential hypertension 07/20/2000   Overview:  HBP   Female cystocele 02/27/2009   GAD (generalized anxiety disorder) 08/19/2015   GERD (gastroesophageal reflux disease)    History of Bell's palsy 03/21/2015   Hx of adenomatous colonic polyps 12/20/2008   Overview:  Colonoscopy 07/2003 - [facility] - polyps removed - recommended repeat in 3 years Colonoscopy 09/2011 - Dr. Darrol Jump - hyperplastic  polyp(s) were removed - repeat in 5 years   Hypertension    Incomplete uterovaginal prolapse 03/04/2004   Insomnia    Left bundle-branch block 05/20/2006   Lumbar radiculopathy 01/09/2016   Formatting of this note might be different from the original. 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 Femoral neck   Other intervertebral disc degeneration, lumbar region 01/29/2015   Pre-operative cardiovascular examination 09/03/2020   Primary osteoarthritis, right hand 02/28/2002   Overview:  OA HANDS, ESP THUMBS   Radial tunnel syndrome, right 11/04/2016   Radiculopathy, lumbar region 11/22/2014   Overview:  Added automatically from request for surgery [id number]   Rhinitis, allergic 02/24/2002   Overview:  ALLERGY SYMPTOMS - states was seen by allergist in past and informed allergic to dust   S/P left total hip arthroplasty 09/17/2020   Sacroiliitis (HCC) 08/02/2019   Screening for colon cancer 04/17/2005   Formatting of this note might be different from the original. Colonoscopy 2005   Sensorineural hearing loss (SNHL) of both ears 07/05/2019   pt can hear normally   Short sleeper 04/29/2020   Snoring 04/29/2020   Spinal stenosis    Spinal stenosis, lumbar region without neurogenic claudication 11/22/2014   Tinnitus aurium, bilateral 07/05/2019   Trigger thumb, unspecified thumb 02/28/2002  Formatting of this note might be different from the original. TRIGGER THUMBS - started spring 2003. Injected 02/28/02   Unspecified asthma, uncomplicated 12/25/2004    Review of Systems  Constitutional: No fever/chills Cardiovascular: Denies chest pain. Respiratory: Positive shortness of breath. Gastrointestinal: No  abdominal pain.   Musculoskeletal: Negative for back pain. Skin: Negative for rash. Neurological: Negative for headaches.  ____________________________________________   PHYSICAL EXAM:  VITAL SIGNS: ED Triage Vitals  Encounter Vitals Group     BP 12/12/22 1839 133/71     Pulse Rate 12/12/22 1839 (!) 101     Resp 12/12/22 1839 20     Temp 12/12/22 1839 98.5 F (36.9 C)     Temp Source 12/12/22 1839 Oral     SpO2 12/12/22 1839 96 %     Weight 12/12/22 1836 157 lb (71.2 kg)     Height 12/12/22 1836 5\' 2"  (1.575 m)   Constitutional: Alert and oriented. Well appearing and in no acute distress. Eyes: Conjunctivae are normal.  Head: Atraumatic. Nose: No congestion/rhinnorhea. Mouth/Throat: Mucous membranes are moist.  Neck: No stridor.   Cardiovascular: Normal rate, regular rhythm. Good peripheral circulation. Grossly normal heart sounds.   Respiratory: Normal respiratory effort.  No retractions. Lungs CTAB. Gastrointestinal: Soft and nontender. No distention.   Musculoskeletal: No gross deformities of extremities. Neurologic:  Normal speech and language.  Skin:  Skin is warm, dry and intact. No rash noted.  ____________________________________________   LABS (all labs ordered are listed, but only abnormal results are displayed)  Labs Reviewed  CBC WITH DIFFERENTIAL/PLATELET - Abnormal; Notable for the following components:      Result Value   Monocytes Absolute 1.1 (*)    All other components within normal limits  COMPREHENSIVE METABOLIC PANEL - Abnormal; Notable for the following components:   Potassium 3.1 (*)    Glucose, Bld 137 (*)    All other components within normal limits  TROPONIN I (HIGH SENSITIVITY) - Abnormal; Notable for the following components:   Troponin I (High Sensitivity) 28 (*)    All other components within normal limits  TROPONIN I (HIGH SENSITIVITY) - Abnormal; Notable for the following components:   Troponin I (High Sensitivity) 31 (*)    All  other components within normal limits  SARS CORONAVIRUS 2 BY RT PCR  BRAIN NATRIURETIC PEPTIDE   ____________________________________________  EKG   EKG Interpretation Date/Time:  Saturday December 12 2022 18:40:54 EDT Ventricular Rate:  108 PR Interval:    QRS Duration:  138 QT Interval:  370 QTC Calculation: 496 R Axis:   -43  Text Interpretation: Atrial fibrillation Left bundle branch block Confirmed by Alona Bene [phone]) on 12/12/2022 6:43:58 PM        ____________________________________________  RADIOLOGY  DG Chest 2 View  Result Date: 12/12/2022 CLINICAL DATA:  Bilateral lower extremity swelling with shortness of breath EXAM: CHEST - 2 VIEW COMPARISON:  Radiographs 06/20/2022 FINDINGS: The heart size and mediastinal contours are within normal limits. Aortic atherosclerotic calcification. Ill-defined hazy opacity in the right lower lung is favored due projectional overlap from vascular structures though atelectasis or pneumonia could appear similarly. Both lungs are otherwise clear. The visualized skeletal structures are unremarkable. IMPRESSION: Ill-defined hazy opacity in the right lower lung is favored due projectional overlap from vascular structures though atelectasis or pneumonia could appear similarly. Consider CT for further evaluation. Electronically Signed   By: Minerva Fester M.D.   On: 12/12/2022 19:43    ____________________________________________   PROCEDURES  Procedure(s) performed:  Procedures  None  ____________________________________________   INITIAL IMPRESSION / ASSESSMENT AND PLAN / ED COURSE  Pertinent labs & imaging results that were available during my care of the patient were reviewed by me and considered in my medical decision making (see chart for details).   This patient is Presenting for Evaluation of abdominal pain, which does require a range of treatment options, and is a complaint that involves a high risk of morbidity and  mortality.  The Differential Diagnoses includes but is not exclusive to acute coronary syndrome, aortic dissection, pulmonary embolism, cardiac tamponade, community-acquired pneumonia, pericarditis, musculoskeletal chest wall pain, etc.   Critical Interventions-    Medications  iohexol (OMNIPAQUE) 350 MG/ML injection 75 mL (75 mLs Intravenous Contrast Given 12/12/22 2032)    Reassessment after intervention:  symptoms improved.    Clinical Laboratory Tests Ordered, included troponin 28 x 1.  BNP normal.  COVID-negative. No AKI. No anemia.   Radiologic Tests Ordered, included CXR and CTA PE. I independently interpreted the images and agree with radiology interpretation.   Cardiac Monitor Tracing which shows tachycardia.    Social Determinants of Health Risk patient is not an active smoker.   Medical Decision Making: Summary:  Patient presents to the emergency department for evaluation of shortness of breath with exertion.  Does not appear acutely volume overloaded.  No hypoxemia.  She arrives with mild tachycardia and recent history of cancer/chemotherapy treatment.  Symptoms began around that time.  I do not feel she lives risk by [place] to move forward with D-dimer for further risk ratification. Patient for CTA PE.  Also, there is a finding on chest x-ray that radiology recommends CT follow-up for further evaluation.   Reevaluation with update and discussion with patient. Labs reassuring. Plan for outpatient follow up and consideration of pulmonology referral discussion with PCP.   Considered admission but symptoms improved. No hypoxemia.   Patient's presentation is most consistent with acute presentation with potential threat to life or bodily function.   Disposition: discharge  ____________________________________________  FINAL CLINICAL IMPRESSION(S) / ED DIAGNOSES  Final diagnoses:  Dyspnea on exertion     NEW OUTPATIENT MEDICATIONS STARTED DURING THIS VISIT:  Discharge  Medication List as of 12/12/2022 10:09 PM     START taking these medications   Details  albuterol (VENTOLIN HFA) 108 (90 Base) MCG/ACT inhaler Inhale 1-2 puffs into the lungs every 6 (six) hours as needed for wheezing or shortness of breath., Starting Sat 12/12/2022, Normal        Note:  This document was prepared using Dragon voice recognition software and may include unintentional dictation errors.  Alona Bene, MD, [facility] Emergency Medicine    Tagg Eustice, Arlyss Repress, MD 12/16/22 1100

## 2022-12-12 NOTE — ED Triage Notes (Signed)
Patient became short of breath today. She stated she can't walk very far without getting short of breath. She took three days worth of lasix for fluid in her legs. No fever or cough.

## 2023-01-12 ENCOUNTER — Encounter: Payer: Self-pay | Admitting: Pulmonary Disease

## 2023-01-12 ENCOUNTER — Ambulatory Visit (INDEPENDENT_AMBULATORY_CARE_PROVIDER_SITE_OTHER): Payer: MEDICARE | Admitting: Pulmonary Disease

## 2023-01-12 ENCOUNTER — Institutional Professional Consult (permissible substitution): Payer: MEDICARE | Admitting: Pulmonary Disease

## 2023-01-12 VITALS — BP 140/62 | HR 85 | Temp 97.9°F | Ht 61.0 in | Wt 159.4 lb

## 2023-01-12 DIAGNOSIS — R0602 Shortness of breath: Secondary | ICD-10-CM

## 2023-01-12 NOTE — Progress Notes (Signed)
Synopsis: Referred in by Estevan Oaks, NP   Subjective:   PATIENT ID: Deanna [name] GENDER: female DOB: Oct 08, 1945, MRN: [id number]  Chief Complaint  Patient presents with   pulmonary consult    CTA 7/13- SOB with exertion, mild dry cough and occ wheezing.     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 Juluis Pitch at [facility], severe osteoarthritis of the right hip with question of infiltrative disease presenting to the pulmonary clinic today for exertional shortness of breath.  She reports that she started experiencing shortness of breath on exertion about 6 months ago.  It is mostly exertional and progressed to the point where she can barely walk from her bedroom to the bathroom without stopping.  She denies any chest tightness wheezing and she denies any cough or sputum production.  She denies any orthopnea or paroxysmal nocturnal dyspnea.  She also denies any allergic rhinitis or eczema.  She does not have any acid reflux symptoms.  She was prescribed albuterol to be used as needed with partial response.  She went to the ED department at [facility] on July with shortness of breath and as part of the workup she underwent a CT angio chest which ruled out PE and failed to show any parenchymal lug disease. However it did show an age indeterminate T10 compression fracture.    Family history: No family history of lung disease   Social history:  Stopped smoking in 1999, smoked 1PPD for 30years. Denies alcohol use, denies illicit drug use. Retired Corporate treasurer for [employer]. No pets at home. Has 2 daughters that are heatlhy.   ROS All systems were reviewed and are negative except for the above.  Objective:   Vitals:   01/12/23 1325  BP:  (!) 140/62  Pulse: 85  Temp: 97.9 F (36.6 C)  TempSrc: Temporal  SpO2: 95%  Weight: 159 lb 6.4 oz (72.3 kg)  Height: 5\' 1"  (1.549 m)   95% on RA BMI Readings from Last 3 Encounters:  01/12/23 30.12 kg/m  12/12/22 28.72 kg/m  06/20/22 26.16 kg/m   Wt Readings from Last 3 Encounters:  01/12/23 159 lb 6.4 oz (72.3 kg)  12/12/22 157 lb (71.2 kg)  06/20/22 143 lb (64.9 kg)    Physical Exam GEN: NAD, Healthy Appearing HEENT: Supple Neck, Reactive Pupils, EOMI  CVS: Normal S1, Normal S2, RRR, No murmurs or ES appreciated  Lungs: Clear bilateral air entry.  Abdomen: Soft, non tender, non distended, + BS  Extremities: Warm and well perfused, lower extremities edema at the ankles worse over the right side.  Skin: No suspicious lesions appreciated  Psych: Normal Affect, anxious.   Ancillary Information   CBC    Component Value Date/Time   WBC 8.3 12/12/2022 1846   RBC 5.05 12/12/2022 1846   HGB 14.6 12/12/2022 1846   HGB 13.6 04/03/2021 0825   HGB 11.5 10/07/2020 1130   HCT 42.2 12/12/2022 1846   HCT 34.0 10/07/2020 1130   PLT 291 12/12/2022 1846   PLT 260 04/03/2021 0825   PLT 368 10/07/2020 1130   MCV 83.6 12/12/2022 1846   MCV 82 10/07/2020 1130   MCH 28.9 12/12/2022 1846   MCHC 34.6 12/12/2022 1846   RDW 13.5 12/12/2022 1846  RDW 13.1 10/07/2020 1130   LYMPHSABS 1.4 12/12/2022 1846   MONOABS 1.1 (H) 12/12/2022 1846   EOSABS 0.2 12/12/2022 1846   BASOSABS 0.1 12/12/2022 1846    Imaging   CT Angio Chest 12/12/2022:  1. Negative for acute pulmonary embolism. 2. Age indeterminate inferior endplate compression fracture of T10 was not present on CT 05/27/2022. 3. Subacute or chronic right lateral sixth rib fracture.  Echocardiogram 09/06/2020 1. Left ventricular ejection fraction, by estimation, is 60 to 65%. The  left ventricle has normal function. The left ventricle has no regional  wall motion abnormalities. Left ventricular diastolic parameters are   consistent with Grade I diastolic  dysfunction (impaired relaxation).   2. Right ventricular systolic function is normal. The right ventricular  size is normal. There is normal pulmonary artery systolic pressure.   3. The mitral valve is normal in structure. Mild mitral valve  regurgitation. No evidence of mitral stenosis.   4. The aortic valve is normal in structure. Aortic valve regurgitation is  not visualized. Mild aortic valve sclerosis is present, with no evidence  of aortic valve stenosis.   5. The inferior vena cava is normal in size with greater than 50%  respiratory variability, suggesting right atrial pressure of 3 mmHg.       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 Juluis Pitch at [facility], severe osteoarthritis of the right hip with question of infiltrative disease presenting to the pulmonary clinic today for exertional shortness of breath.  #Dyspnea on exertion  Her dyspnea is likely multifactorial in the setting of deconditioning specifically now with her hip fracture.  On her last echo about 2 years ago she had grade 1 diastolic dysfunction she is definitely major component here.  Her history does not hint towards asthma or COPD however she was a smoker and there is mild emphysema on her CT chest therefore it would be reasonable to proceed with PFTs.   []  Pulmonary function test. []  Echocardiogram. []  Continue with albuterol 2 puffs every 6 hours as needed for shortness of breath. []  Should the above not reveal any explanation to her dyspnea might proceed with a cardiopulmonary exercise test once able.  #History of marginal zone lymphoma  08/22 diagnosed with a biopsy of  cervical lymph node status post 3 cycles of BR (Bendamustine - Rituximab) last dose in December 2023 follows with Dr. Juluis Pitch at [facility].  []  Has a follow-up appointment in October 2024.  # Right hip osteoarthritis with question of infiltrative disease.  with plan for hip replacement in October.  Return in about 7 weeks (around 03/02/2023).  I spent 60 minutes caring for this patient today, including preparing to see the patient, obtaining a medical history , reviewing a separately obtained history, performing a medically appropriate examination and/or evaluation, ordering medications, tests, or procedures, referring and communicating with other health care professionals (not separately reported), documenting clinical information in the electronic health record, and independently interpreting results (not separately reported/billed) and communicating results to the patient/family/caregiver  Janann Colonel, MD [facility] 01/12/2023 2:05 PM

## 2023-01-14 ENCOUNTER — Other Ambulatory Visit: Payer: Self-pay | Admitting: Nurse Practitioner

## 2023-01-22 ENCOUNTER — Other Ambulatory Visit: Payer: Self-pay | Admitting: Nurse Practitioner

## 2023-01-22 DIAGNOSIS — I10 Essential (primary) hypertension: Secondary | ICD-10-CM

## 2023-01-27 ENCOUNTER — Ambulatory Visit: Payer: MEDICARE

## 2023-02-02 ENCOUNTER — Ambulatory Visit
Admission: RE | Admit: 2023-02-02 | Discharge: 2023-02-02 | Disposition: A | Discharge status: Home / Self Care | Payer: MEDICARE | Source: Ambulatory Visit | Attending: Pulmonary Disease | Admitting: Pulmonary Disease

## 2023-02-02 DIAGNOSIS — I340 Nonrheumatic mitral (valve) insufficiency: Secondary | ICD-10-CM | POA: Diagnosis not present

## 2023-02-02 DIAGNOSIS — R0602 Shortness of breath: Secondary | ICD-10-CM

## 2023-02-02 DIAGNOSIS — I10 Essential (primary) hypertension: Secondary | ICD-10-CM | POA: Diagnosis not present

## 2023-02-02 LAB — ECHOCARDIOGRAM COMPLETE
AR max vel: 2.84 cm2
AV Area VTI: 3.24 cm2
AV Area mean vel: 2.89 cm2
AV Mean grad: 6 mmHg
AV Peak grad: 8.8 mmHg
Ao pk vel: 1.48 m/s
Area-P 1/2: 3.81 cm2
MV M vel: 4.49 m/s
MV Peak grad: 80.6 mmHg
MV VTI: 2.09 cm2
Radius: 0.7 cm
S' Lateral: 2.4 cm

## 2023-02-09 ENCOUNTER — Encounter (HOSPITAL_COMMUNITY): Payer: Self-pay

## 2023-02-09 ENCOUNTER — Observation Stay (HOSPITAL_COMMUNITY)
Admission: EM | Admit: 2023-02-09 | Discharge: 2023-02-11 | Disposition: A | Discharge status: Home / Self Care | Payer: MEDICARE | Attending: Cardiovascular Disease | Admitting: Cardiovascular Disease

## 2023-02-09 ENCOUNTER — Other Ambulatory Visit: Payer: Self-pay

## 2023-02-09 ENCOUNTER — Telehealth: Payer: Self-pay | Admitting: Pulmonary Disease

## 2023-02-09 ENCOUNTER — Ambulatory Visit: Payer: MEDICARE | Attending: Pulmonary Disease

## 2023-02-09 ENCOUNTER — Emergency Department (HOSPITAL_COMMUNITY): Payer: MEDICARE

## 2023-02-09 DIAGNOSIS — I480 Paroxysmal atrial fibrillation: Secondary | ICD-10-CM | POA: Diagnosis not present

## 2023-02-09 DIAGNOSIS — Z79899 Other long term (current) drug therapy: Secondary | ICD-10-CM | POA: Diagnosis not present

## 2023-02-09 DIAGNOSIS — R0602 Shortness of breath: Secondary | ICD-10-CM

## 2023-02-09 DIAGNOSIS — Z87891 Personal history of nicotine dependence: Secondary | ICD-10-CM | POA: Diagnosis not present

## 2023-02-09 DIAGNOSIS — I10 Essential (primary) hypertension: Secondary | ICD-10-CM | POA: Insufficient documentation

## 2023-02-09 DIAGNOSIS — I249 Acute ischemic heart disease, unspecified: Secondary | ICD-10-CM

## 2023-02-09 DIAGNOSIS — Z96642 Presence of left artificial hip joint: Secondary | ICD-10-CM | POA: Diagnosis not present

## 2023-02-09 DIAGNOSIS — R0600 Dyspnea, unspecified: Secondary | ICD-10-CM | POA: Insufficient documentation

## 2023-02-09 DIAGNOSIS — J45909 Unspecified asthma, uncomplicated: Secondary | ICD-10-CM | POA: Insufficient documentation

## 2023-02-09 DIAGNOSIS — I4891 Unspecified atrial fibrillation: Secondary | ICD-10-CM

## 2023-02-09 HISTORY — DX: Acute ischemic heart disease, unspecified: I24.9

## 2023-02-09 LAB — BASIC METABOLIC PANEL
Anion gap: 17 — ABNORMAL HIGH (ref 5–15)
BUN: 15 mg/dL (ref 8–23)
CO2: 20 mmol/L — ABNORMAL LOW (ref 22–32)
Calcium: 9.3 mg/dL (ref 8.9–10.3)
Chloride: 102 mmol/L (ref 98–111)
Creatinine, Ser: 0.64 mg/dL (ref 0.44–1.00)
GFR, Estimated: >60 mL/min (ref >60)
Glucose, Bld: 105 mg/dL — ABNORMAL HIGH (ref 70–99)
Potassium: 3.6 mmol/L (ref 3.5–5.1)
Sodium: 139 mmol/L (ref 135–145)

## 2023-02-09 LAB — PULMONARY FUNCTION TEST ARMC ONLY
DL/VA % pred: 88 %
DL/VA: 3.71 ml/min/mmHg/L
DLCO unc % pred: 73 %
DLCO unc: 13.2 ml/min/mmHg
FEF 25-75 Post: 0.97 L/s
FEF 25-75 Pre: 0.9 L/s
FEF2575-%Change-Post: 6 %
FEF2575-%Pred-Post: 62 %
FEF2575-%Pred-Pre: 58 %
FEV1-%Change-Post: 4 %
FEV1-%Pred-Post: 68 %
FEV1-%Pred-Pre: 65 %
FEV1-Post: 1.32 L
FEV1-Pre: 1.26 L
FEV1FVC-%Change-Post: 2 %
FEV1FVC-%Pred-Pre: 93 %
FEV6-%Change-Post: 3 %
FEV6-%Pred-Post: 74 %
FEV6-%Pred-Pre: 71 %
FEV6-Post: 1.82 L
FEV6-Pre: 1.76 L
FEV6FVC-%Change-Post: 0 %
FEV6FVC-%Pred-Post: 104 %
FEV6FVC-%Pred-Pre: 105 %
FVC-%Change-Post: 1 %
FVC-%Pred-Post: 70 %
FVC-%Pred-Pre: 69 %
FVC-Post: 1.83 L
FVC-Pre: 1.8 L
Post FEV1/FVC ratio: 72 %
Post FEV6/FVC ratio: 99 %
Pre FEV1/FVC ratio: 70 %
Pre FEV6/FVC Ratio: 100 %
RV % pred: 123 %
RV: 2.7 L
TLC % pred: 101 %
TLC: 4.82 L

## 2023-02-09 LAB — TSH: TSH: 3.431 u[IU]/mL (ref 0.350–4.500)

## 2023-02-09 LAB — BRAIN NATRIURETIC PEPTIDE: B Natriuretic Peptide: 45.2 pg/mL (ref 0.0–100.0)

## 2023-02-09 LAB — CBC
HCT: 37.2 % (ref 36.0–46.0)
Hemoglobin: 12.2 g/dL (ref 12.0–15.0)
MCH: 27.9 pg (ref 26.0–34.0)
MCHC: 32.8 g/dL (ref 30.0–36.0)
MCV: 84.9 fL (ref 80.0–100.0)
Platelets: 252 10*3/uL (ref 150–400)
RBC: 4.38 MIL/uL (ref 3.87–5.11)
RDW: 13 % (ref 11.5–15.5)
WBC: 9.2 10*3/uL (ref 4.0–10.5)
nRBC: 0 % (ref 0.0–0.2)

## 2023-02-09 LAB — TROPONIN I (HIGH SENSITIVITY)
Troponin I (High Sensitivity): 42 ng/L — ABNORMAL HIGH (ref <18)
Troponin I (High Sensitivity): 39 ng/L — ABNORMAL HIGH (ref <18)

## 2023-02-09 LAB — MAGNESIUM: Magnesium: 2.1 mg/dL (ref 1.7–2.4)

## 2023-02-09 LAB — D-DIMER, QUANTITATIVE: D-Dimer, Quant: 0.74 ug{FEU}/mL — ABNORMAL HIGH (ref 0.00–0.50)

## 2023-02-09 LAB — T4, FREE: Free T4: 1 ng/dL (ref 0.61–1.12)

## 2023-02-09 MED ORDER — VITAMIN A 2400 MCG (8000 UT) PO CAPS: 16000.0000 [IU] | ORAL_CAPSULE | Freq: Every day | ORAL | Status: DC

## 2023-02-09 MED ORDER — SODIUM CHLORIDE 0.9 % IV SOLN: 250.0000 mL | INTRAVENOUS | Status: DC | PRN

## 2023-02-09 MED ORDER — RISAQUAD PO CAPS
1.0000 | ORAL_CAPSULE | Freq: Every day | ORAL | Status: DC
  Administered 2023-02-11: 1 via ORAL
  Filled 2023-02-09 (×3): qty 1

## 2023-02-09 MED ORDER — ALBUTEROL SULFATE (2.5 MG/3ML) 0.083% IN NEBU: 2.5000 mg | INHALATION_SOLUTION | Freq: Four times a day (QID) | RESPIRATORY_TRACT | Status: DC | PRN

## 2023-02-09 MED ORDER — METOPROLOL TARTRATE 25 MG PO TABS
25.0000 mg | ORAL_TABLET | Freq: Two times a day (BID) | ORAL | Status: DC
  Administered 2023-02-09 – 2023-02-11 (×3): 25 mg via ORAL
  Filled 2023-02-09 (×4): qty 1

## 2023-02-09 MED ORDER — ACETAMINOPHEN 325 MG PO TABS
650.0000 mg | ORAL_TABLET | ORAL | Status: DC | PRN
  Filled 2023-02-09: qty 2

## 2023-02-09 MED ORDER — ONDANSETRON HCL 4 MG/2ML IJ SOLN: 4.0000 mg | Freq: Four times a day (QID) | INTRAMUSCULAR | Status: DC | PRN

## 2023-02-09 MED ORDER — NITROGLYCERIN 0.4 MG SL SUBL: 0.4000 mg | SUBLINGUAL_TABLET | SUBLINGUAL | Status: DC | PRN

## 2023-02-09 MED ORDER — ASPIRIN 81 MG PO CHEW
324.0000 mg | CHEWABLE_TABLET | ORAL | Status: AC
  Administered 2023-02-09: 324 mg via ORAL
  Filled 2023-02-09: qty 4

## 2023-02-09 MED ORDER — VITAMIN A 3 MG (10000 UNIT) PO CAPS
10000.0000 [IU] | ORAL_CAPSULE | Freq: Every day | ORAL | Status: DC
  Administered 2023-02-11: 10000 [IU] via ORAL
  Filled 2023-02-09 (×2): qty 1

## 2023-02-09 MED ORDER — HYDROCODONE-ACETAMINOPHEN 5-325 MG PO TABS
1.0000 | ORAL_TABLET | Freq: Once | ORAL | Status: AC
  Administered 2023-02-09: 1 via ORAL
  Filled 2023-02-09: qty 1

## 2023-02-09 MED ORDER — LORATADINE 10 MG PO TABS: 10.0000 mg | ORAL_TABLET | Freq: Every day | ORAL | Status: DC | PRN

## 2023-02-09 MED ORDER — VITAMIN C 500 MG PO TABS
2000.0000 mg | ORAL_TABLET | Freq: Every day | ORAL | Status: DC
  Administered 2023-02-11: 2000 mg via ORAL
  Filled 2023-02-09: qty 4

## 2023-02-09 MED ORDER — DILTIAZEM HCL 25 MG/5ML IV SOLN
20.0000 mg | Freq: Once | INTRAVENOUS | Status: AC
  Administered 2023-02-09: 20 mg via INTRAVENOUS
  Filled 2023-02-09: qty 5

## 2023-02-09 MED ORDER — HEPARIN BOLUS VIA INFUSION
4000.0000 [IU] | Freq: Once | INTRAVENOUS | Status: AC
  Administered 2023-02-09: 4000 [IU] via INTRAVENOUS
  Filled 2023-02-09: qty 4000

## 2023-02-09 MED ORDER — SODIUM CHLORIDE 0.9% FLUSH: 3.0000 mL | INTRAVENOUS | Status: DC | PRN

## 2023-02-09 MED ORDER — POLYVINYL ALCOHOL 1.4 % OP SOLN
1.0000 [drp] | Freq: Every day | OPHTHALMIC | Status: DC
  Administered 2023-02-11: 1 [drp] via OPHTHALMIC
  Filled 2023-02-09: qty 15

## 2023-02-09 MED ORDER — MAGNESIUM OXIDE -MG SUPPLEMENT 400 (240 MG) MG PO TABS
400.0000 mg | ORAL_TABLET | Freq: Every day | ORAL | Status: DC
  Administered 2023-02-09 – 2023-02-11 (×2): 400 mg via ORAL
  Filled 2023-02-09 (×2): qty 1

## 2023-02-09 MED ORDER — CALCIUM-MAGNESIUM 500-250 MG PO TABS: ORAL_TABLET | Freq: Every day | ORAL | Status: DC

## 2023-02-09 MED ORDER — MILK THISTLE 175 MG PO TABS: 175.0000 mg | ORAL_TABLET | Freq: Every day | ORAL | Status: DC

## 2023-02-09 MED ORDER — LOSARTAN POTASSIUM 50 MG PO TABS
50.0000 mg | ORAL_TABLET | Freq: Every day | ORAL | Status: DC
  Administered 2023-02-11: 50 mg via ORAL
  Filled 2023-02-09 (×2): qty 1

## 2023-02-09 MED ORDER — DILTIAZEM HCL ER 60 MG PO CP12
120.0000 mg | ORAL_CAPSULE | Freq: Two times a day (BID) | ORAL | Status: DC
  Administered 2023-02-10 – 2023-02-11 (×2): 120 mg via ORAL
  Filled 2023-02-09 (×5): qty 2

## 2023-02-09 MED ORDER — HEPARIN (PORCINE) 25000 UT/250ML-% IV SOLN
1000.0000 [IU]/h | INTRAVENOUS | Status: DC
  Administered 2023-02-09: 800 [IU]/h via INTRAVENOUS
  Filled 2023-02-09 (×2): qty 250

## 2023-02-09 MED ORDER — ROSUVASTATIN CALCIUM 20 MG PO TABS
20.0000 mg | ORAL_TABLET | Freq: Every day | ORAL | Status: DC
  Administered 2023-02-09 – 2023-02-11 (×3): 20 mg via ORAL
  Filled 2023-02-09 (×3): qty 1

## 2023-02-09 MED ORDER — SODIUM CHLORIDE 0.9% FLUSH
3.0000 mL | Freq: Two times a day (BID) | INTRAVENOUS | Status: DC
  Administered 2023-02-10 – 2023-02-11 (×2): 3 mL via INTRAVENOUS

## 2023-02-09 MED ORDER — ASPIRIN 300 MG RE SUPP: 300.0000 mg | RECTAL | Status: AC

## 2023-02-09 MED ORDER — ASPIRIN 81 MG PO TBEC
81.0000 mg | DELAYED_RELEASE_TABLET | Freq: Every day | ORAL | Status: DC
  Filled 2023-02-09: qty 1

## 2023-02-09 MED ORDER — SODIUM CHLORIDE 0.9 % IV SOLN: INTRAVENOUS | Status: DC

## 2023-02-09 MED ORDER — HYDROCHLOROTHIAZIDE 25 MG PO TABS
25.0000 mg | ORAL_TABLET | Freq: Every day | ORAL | Status: DC
  Administered 2023-02-11: 25 mg via ORAL
  Filled 2023-02-09 (×2): qty 1

## 2023-02-09 NOTE — ED Provider Notes (Signed)
[facility] AT [facility] Provider Note   CSN: [id number] Arrival date & time: 02/09/23  1841     History  Chief Complaint  Patient presents with   Shortness of Breath   HPI Deanna [name] is a 77 y.o. female with hypertension, LVH, left bundle branch block seen for shortness of breath.  States she was at her pulmonary clinic today undergoing PFTs around 1 PM.  States she is always short of breath but after the PFT test that she was going to her car she started to be more short of breath and symptoms worsen.  Seem to be worse with exertion.  Denies chest pain or palpitations.  EMS was called and initial O2 sat was 95% on room air.  EKG showed a an irregular heart rate with a heart rate of 100-130.  Patient also mention that she has had some bilateral lower extremity edema   Shortness of Breath      Home Medications Prior to Admission medications   Medication Sig Start Date End Date Taking? Authorizing Provider  albuterol (VENTOLIN HFA) 108 (90 Base) MCG/ACT inhaler Inhale 1-2 puffs into the lungs every 6 (six) hours as needed for wheezing or shortness of breath. 12/12/22  Yes Long, Arlyss Repress, MD  amLODipine (NORVASC) 10 MG tablet TAKE 1 TABLET BY MOUTH EVERY DAY 01/25/23  Yes Arnette Felts, FNP  Ascorbic Acid (VITAMIN C) 1000 MG tablet Take 4,000 mg by mouth daily.   Yes [provider]  Calcium-Magnesium (CAL-MAG PO) Take 2,000 mg by mouth at bedtime. 1000 mg   Yes [provider]  Carboxymethylcellul-Glycerin (CLEAR EYES FOR DRY EYES OP) Place 1 drop into both eyes daily.   Yes [provider]  [employer] MAGNESIUM OXIDE 250 MG TABS TAKE 1 TABLET (250 MG TOTAL) BY MOUTH EVERY EVENING. TAKE WITH EVENING MEAL Patient taking differently: Take 250 mg by mouth daily. 09/15/21  Yes Arnette Felts, FNP  diclofenac (VOLTAREN) 75 MG EC tablet Take 75 mg by mouth 2 (two) times daily.   Yes [provider]  furosemide (LASIX) 20 MG  tablet Take 20 mg by mouth as needed. 02/05/23  Yes [provider]  hydrochlorothiazide (HYDRODIURIL) 25 MG tablet Take 1 tablet (25 mg total) by mouth 2 (two) times daily. Patient taking differently: Take 1 tablet by mouth daily. 10/07/20  Yes Arnette Felts, FNP  Ibuprofen-Acetaminophen (ADVIL DUAL ACTION PO) Take 1 tablet by mouth daily.   Yes [provider]  loratadine (CLARITIN) 10 MG tablet Take 10 mg by mouth daily as needed for allergies.   Yes [provider]  losartan (COZAAR) 50 MG tablet TAKE 1 TABLET BY MOUTH EVERY DAY 01/14/23  Yes Arnette Felts, FNP  milk thistle 175 MG tablet Take 175 mg by mouth daily.   Yes [provider]  Probiotic Product (PROBIOTIC DAILY PO) Take 1 tablet by mouth daily.   Yes [provider]  Vitamin A 2400 MCG (8000 UT) CAPS Take 16,000 Units by mouth daily. 8000 units   Yes [provider]  Vitamin D, Ergocalciferol, (DRISDOL) 1.25 MG (50000 UNIT) CAPS capsule Take 50,000 Units by mouth once a week.   Yes [provider]  OVER THE COUNTER MEDICATION Take 2,000 mg by mouth daily. [place]-D'arco Patient not taking: Reported on 02/09/2023    [provider]      Allergies    Codeine, Gluten meal, Dust mite extract, Gabapentin, Sulfa antibiotics, and Terbinafine hcl  Review of Systems   Review of Systems  Respiratory:  Positive for shortness of breath.     Physical Exam Updated Vital Signs BP (!) 124/96   Pulse 81   Temp 98.4 F (36.9 C) (Oral)   Resp (!) 21   Ht 5\' 1"  (1.549 m)   Wt 73 kg   SpO2 96%   BMI 30.41 kg/m  Physical Exam Vitals and nursing note reviewed.  HENT:     Head: Normocephalic and atraumatic.     Mouth/Throat:     Mouth: Mucous membranes are moist.  Eyes:     General:        Right eye: No discharge.        Left eye: No discharge.     Conjunctiva/sclera: Conjunctivae normal.  Cardiovascular:     Rate and Rhythm: Tachycardia present. Rhythm irregular.      Pulses: Normal pulses.     Heart sounds: Normal heart sounds.  Pulmonary:     Effort: Pulmonary effort is normal.     Breath sounds: Normal breath sounds. No decreased breath sounds, wheezing or rhonchi.  Abdominal:     General: Abdomen is flat.     Palpations: Abdomen is soft.  Skin:    General: Skin is warm and dry.  Neurological:     General: No focal deficit present.  Psychiatric:        Mood and Affect: Mood normal.     ED Results / Procedures / Treatments   Labs (all labs ordered are listed, but only abnormal results are displayed) Labs Reviewed  BASIC METABOLIC PANEL - Abnormal; Notable for the following components:      Result Value   CO2 20 (*)    Glucose, Bld 105 (*)    Anion gap 17 (*)    All other components within normal limits  D-DIMER, QUANTITATIVE - Abnormal; Notable for the following components:   D-Dimer, Quant 0.74 (*)    All other components within normal limits  TROPONIN I (HIGH SENSITIVITY) - Abnormal; Notable for the following components:   Troponin I (High Sensitivity) 42 (*)    All other components within normal limits  TROPONIN I (HIGH SENSITIVITY) - Abnormal; Notable for the following components:   Troponin I (High Sensitivity) 39 (*)    All other components within normal limits  MAGNESIUM  CBC  BRAIN NATRIURETIC PEPTIDE  TSH  T4, FREE  LIPOPROTEIN A (LPA)  BASIC METABOLIC PANEL  LIPID PANEL  CBC  PROTEIN ELECTROPHORESIS, SERUM  HEPARIN LEVEL (UNFRACTIONATED)    EKG None  Radiology DG Chest Port 1 View  Result Date: 02/09/2023 CLINICAL DATA:  Dyspnea EXAM: PORTABLE CHEST 1 VIEW COMPARISON:  12/12/2022 FINDINGS: Lungs are clear save for minimal left basilar atelectasis or linear scarring. No pneumothorax or pleural effusion. Cardiac size within normal limits. Pulmonary vascularity is normal. No acute bone abnormality. IMPRESSION: No active disease. Electronically Signed   By: Helyn Numbers M.D.   On: 02/09/2023 20:03     Procedures .Critical Care  Performed by: Gareth Eagle, PA-C Authorized by: Gareth Eagle, PA-C   Critical care provider statement:    Critical care time (minutes):  30   Critical care was necessary to treat or prevent imminent or life-threatening deterioration of the following conditions: New onset A-fib with RVR and associated dyspnea.   Critical care was time spent personally by me on the following activities:  Development of treatment plan with patient or surrogate, discussions with consultants, evaluation of  patient's response to treatment, examination of patient, ordering and review of laboratory studies, ordering and review of radiographic studies, ordering and performing treatments and interventions, pulse oximetry, re-evaluation of patient's condition and review of old charts     Medications Ordered in ED Medications  aspirin EC tablet 81 mg (has no administration in time range)  nitroGLYCERIN (NITROSTAT) SL tablet 0.4 mg (has no administration in time range)  acetaminophen (TYLENOL) tablet 650 mg (has no administration in time range)  ondansetron (ZOFRAN) injection 4 mg (has no administration in time range)  sodium chloride flush (NS) 0.9 % injection 3 mL (3 mLs Intravenous Not Given 02/09/23 2154)  sodium chloride flush (NS) 0.9 % injection 3 mL (has no administration in time range)  0.9 %  sodium chloride infusion (has no administration in time range)  0.9 %  sodium chloride infusion ( Intravenous Not Given 02/09/23 2152)  metoprolol tartrate (LOPRESSOR) tablet 25 mg (25 mg Oral Given 02/09/23 2227)  albuterol (PROVENTIL) (2.5 MG/3ML) 0.083% nebulizer solution 2.5 mg (has no administration in time range)  ascorbic acid (VITAMIN C) tablet 2,000 mg (has no administration in time range)  Carboxymethylcellul-Glycerin 1-0.25 % SOLN ( Both Eyes Not Given 02/09/23 2219)  magnesium oxide (MAG-OX) tablet 400 mg (400 mg Oral Given 02/09/23 2223)  hydrochlorothiazide (HYDRODIURIL)  tablet 25 mg (has no administration in time range)  loratadine (CLARITIN) tablet 10 mg (has no administration in time range)  losartan (COZAAR) tablet 50 mg (has no administration in time range)  acidophilus (RISAQUAD) capsule 1 capsule (has no administration in time range)  diltiazem (CARDIZEM SR) 12 hr capsule 120 mg (has no administration in time range)  rosuvastatin (CRESTOR) tablet 20 mg (20 mg Oral Given 02/09/23 2223)  heparin ADULT infusion 100 units/mL (25000 units/270mL) (800 Units/hr Intravenous New Bag/Given 02/09/23 2214)  vitamin A capsule 10,000 Units (has no administration in time range)  diltiazem (CARDIZEM) injection 20 mg (20 mg Intravenous Given 02/09/23 1908)  HYDROcodone-acetaminophen (NORCO/VICODIN) 5-325 MG per tablet 1 tablet (1 tablet Oral Given 02/09/23 2113)  aspirin chewable tablet 324 mg (324 mg Oral Given 02/09/23 2223)    Or  aspirin suppository 300 mg ( Rectal See Alternative 02/09/23 2223)  heparin bolus via infusion 4,000 Units (4,000 Units Intravenous Bolus from Bag 02/09/23 2215)    ED Course/ Medical Decision Making/ A&P         CHA2DS2-VASc Score: 4                        Medical Decision Making Amount and/or Complexity of Data Reviewed Labs: ordered. Radiology: ordered.  Risk Prescription drug management. Decision regarding hospitalization.   Initial Impression and Ddx 77 year old well-appearing female presenting for shortness of breath. Initially found to have irregular tachycardia on telemetry. Ddx with new onset A-fib, ACS, PE, sepsis, thyrotoxicosis or other. Patient PMH that increases complexity of ED encounter:  hypertension, LVH, left bundle branch block  Interpretation of Diagnostics - I independent reviewed and interpreted the labs as followed: Elevated troponin  - I independently visualized the following imaging with scope of interpretation limited to determining acute life threatening conditions related to emergency care: CXR, which  revealed no active disease  -I personally reviewed and interpreted EKG which revealed A-fib with RVR and left bundle branch block  Patient Reassessment and Ultimate Disposition/Management After treatment with 20 mg of diltiazem.  Patient converted back to sinus and remained. Patient stated that her shortness of breath after treatment is also  improved.  Discussed patient with Dr. Algie Coffer of cardiology who agreed to admit to cardiology service for new onset atrial fibrillation.  Patient management required discussion with the following services or consulting groups:  Cardiology  Complexity of Problems Addressed Acute complicated illness or Injury  Additional Data Reviewed and Analyzed Further history obtained from: Further history from spouse/family member, Past medical history and medications listed in the EMR, and Prior ED visit notes  Patient Encounter Risk Assessment Consideration of hospitalization         Final Clinical Impression(s) / ED Diagnoses Final diagnoses:  SOB (shortness of breath)  Atrial fibrillation with RVR (HCC)    Rx / DC Orders ED Discharge Orders          Ordered    Amb referral to AFIB Clinic        02/09/23 1903              Gareth Eagle, PA-C 02/09/23 2305    Franne Forts, DO 02/14/23 [phone]

## 2023-02-09 NOTE — H&P (Signed)
Referring Physician: Franne Forts. DO/John Johnnette Gourd, PA-C  Deanna [name] is an 77 y.o. female.                       Chief Complaint: Shortness of breath  HPI: 77 years old black female with PMH of HTN, HCM, LBBB has shortness of breath increasing post PFT today. She was found in atrial fibrillation with RVR. She responded to 20 mg. of IV diltiazem. Patient has noticed mild lower extremity edema for chronic 10 mg. amlodipine use. She is awaiting hip surgery in 1 week. Her troponin I is minimally elevated. Her EKG shows sinus rhythm with LBBB. Chest x-ray is unremarkable. Echocardiogram done on 02/02/2023 showed moderate to severe Hypertrophy of LV.  Past Medical History:  Diagnosis Date   Abnormal glucose 03/06/2020   Age-related osteoporosis without current pathological fracture 11/22/2015   Overview:  Femoral neck  Formatting of this note might be different from the original. 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 Added automatically from request for surgery [id number]   Chronic pain syndrome 04/01/2015   Colon polyps    Essential hypertension 07/20/2000   Overview:  HBP   Female cystocele 02/27/2009   GAD (generalized anxiety disorder) 08/19/2015   GERD (gastroesophageal reflux disease)    History of Bell's palsy 03/21/2015   Hx of adenomatous colonic polyps 12/20/2008   Overview:  Colonoscopy 07/2003 - [facility] - polyps removed - recommended repeat in 3 years Colonoscopy 09/2011 - Dr. Darrol Jump - hyperplastic polyp(s) were removed - repeat in 5 years   Hypertension    Incomplete uterovaginal prolapse 03/04/2004   Insomnia    Left bundle-branch block 05/20/2006    Lumbar radiculopathy 01/09/2016   Formatting of this note might be different from the original. 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  TRIGGER THUMBS - started spring 2003. Injected 02/28/02   Unspecified asthma, uncomplicated 12/25/2004      Past Surgical History:  Procedure Laterality Date   ABDOMINAL HYSTERECTOMY  2010   APPENDECTOMY  1969   CHOLECYSTECTOMY  1969   SPINAL FUSION  01/2016   TONSILLECTOMY AND ADENOIDECTOMY  1956   TOTAL HIP ARTHROPLASTY Left 09/17/2020   Procedure: TOTAL HIP ARTHROPLASTY ANTERIOR APPROACH;  Surgeon: Durene Romans,  MD;  Location: [facility];  Service: Orthopedics;  Laterality: Left;  70 mins    Family History  Problem Relation Age of Onset   Arthritis Mother    Diabetes Mother    Pancreatic cancer Mother 88   Early death Father    Leukemia Father    Leukemia Sister    Early death Sister    Diabetes Sister    Social History:  reports that she quit smoking about 25 years ago. Her smoking use included cigarettes. She started smoking about 55 years ago. She has a 15 pack-year smoking history. She has never used smokeless tobacco. She reports that she does not currently use alcohol. She reports that she does not use drugs.  Allergies:  Allergies  Allergen Reactions   Codeine Nausea And Vomiting and Palpitations    Chest pains, acid reflux  Other reaction(s): Nausea and/or Vomiting, Other, Other (See Comments), Other (See Comments) Chest pain and acid reflux Chest pains, acid reflux  Codeine made her feel like she was having a heart attack. Chest pain Feels like she is having a heart attack. Feels like she is having a heart attack. Codeine made her feel like she was having a heart attack. Chest pain Feels like she is having a heart attack. Chest pain Feels like she is having a heart attack. Chest pains, acid reflux    Gluten Meal Diarrhea and Other (See Comments)    Severe diarrhea per member    Dust Mite Extract     Upper Respiratory issues    Gabapentin     Eyes went numb and blurry   Sulfa Antibiotics Other (See Comments)   Terbinafine Hcl Rash    (Not in a hospital admission)   Results for orders placed or performed during the hospital encounter of 02/09/23 (from the past 48 hour(s))  Basic metabolic panel     Status: Abnormal   Collection Time: 02/09/23  7:13 PM  Result Value Ref Range   Sodium 139 135 - 145 mmol/L   Potassium 3.6 3.5 - 5.1 mmol/L    Comment: HEMOLYSIS AT THIS LEVEL MAY AFFECT RESULT   Chloride 102 98 - 111 mmol/L   CO2 20 (L) 22 - 32 mmol/L   Glucose, Bld  105 (H) 70 - 99 mg/dL    Comment: Glucose reference range applies only to samples taken after fasting for at least 8 hours.   BUN 15 8 - 23 mg/dL   Creatinine, Ser 1.61 0.44 - 1.00 mg/dL   Calcium 9.3 8.9 - 09.6 mg/dL   GFR, Estimated >04 >54 mL/min    Comment: (NOTE) Calculated using the CKD-EPI Creatinine Equation (2021)    Anion gap 17 (H) 5 - 15    Comment: Performed at [facility], [address], [place], [place] [address]  Magnesium     Status: None   Collection Time: 02/09/23  7:13 PM  Result Value Ref Range   Magnesium 2.1 1.7 - 2.4 mg/dL    Comment: Performed at [facility], [address], [place], [place] [address]  CBC     Status: None   Collection Time: 02/09/23  7:13 PM  Result Value Ref Range   WBC 9.2 4.0 - 10.5 K/uL   RBC 4.38 3.87 - 5.11 MIL/uL   Hemoglobin 12.2 12.0 - 15.0 g/dL   HCT 40.3 47.4 - 25.9 %   MCV 84.9 80.0 - 100.0 fL   MCH 27.9 26.0 - 34.0 pg   MCHC 32.8 30.0 - 36.0 g/dL   RDW 56.3 87.5 - 64.3 %   Platelets 252 150 - 400 K/uL   nRBC 0.0 0.0 - 0.2 %    Comment: Performed at [facility], [address] [place], [place] [address]  Brain natriuretic peptide (IF shortness of breath has been documented this visit)     Status: None   Collection Time: 02/09/23  7:13 PM  Result Value Ref Range   B Natriuretic Peptide 45.2 0.0 - 100.0 pg/mL    Comment: Performed at [facility], [address] [place], [place] [address]  Troponin I (High Sensitivity)     Status: Abnormal   Collection Time: 02/09/23  7:13 PM  Result Value Ref Range   Troponin I (High Sensitivity) 42 (H) <18 ng/L    Comment: (NOTE) Elevated high sensitivity troponin I (hsTnI) values and significant  changes across serial measurements may suggest ACS but many other  chronic and acute conditions are known to elevate hsTnI results.  Refer to the "Links" section for chest pain algorithms and additional  guidance. Performed at [facility], [address], [place], [place] [address]   D-dimer, quantitative     Status: Abnormal   Collection Time: 02/09/23  7:13 PM  Result Value Ref Range   D-Dimer, Quant 0.74 (H) 0.00 - 0.50 ug/mL-FEU    Comment: (NOTE) At the manufacturer cut-off value of 0.5 g/mL FEU, this assay has a negative predictive value of 95-100%.This assay is intended for use in conjunction with a clinical pretest probability (PTP) assessment model to exclude pulmonary embolism (PE) and deep venous thrombosis (DVT) in outpatients suspected of PE or DVT. Results should be correlated with clinical presentation. Performed at [facility], [address] [place], [place] [address]   TSH     Status: None   Collection Time: 02/09/23  7:13 PM  Result Value Ref Range   TSH 3.431 0.350 - 4.500 uIU/mL    Comment: Performed by a 3rd Generation assay with a functional sensitivity of <=0.01 uIU/mL. Performed at [facility], [address] [place], [place] [address]   T4, free     Status: None   Collection Time: 02/09/23  7:13 PM  Result Value Ref Range   Free T4 1.00 0.61 - 1.12 ng/dL    Comment: HEMOLYSIS AT THIS LEVEL MAY AFFECT RESULT (NOTE) Biotin ingestion may interfere with free T4 tests. If the results are inconsistent with the TSH level, previous test results, or the clinical presentation, then consider biotin interference. If needed, order repeat testing after stopping biotin. Performed at [facility], [address], [place], [place] [address]    DG Chest Port 1 View  Result Date: 02/09/2023 CLINICAL DATA:  Dyspnea EXAM: PORTABLE CHEST 1 VIEW COMPARISON:  12/12/2022 FINDINGS: Lungs are clear save for minimal left basilar atelectasis or linear scarring. No pneumothorax or pleural effusion. Cardiac size within normal limits. Pulmonary vascularity is normal. No acute bone abnormality. IMPRESSION: No active disease. Electronically  Signed   By: Helyn Numbers M.D.   On: 02/09/2023 20:03    Review Of  Systems Constitutional: No fever, chills, weight loss or gain. Eyes: No vision change, wears glasses. No discharge or pain. Ears: No hearing loss, No tinnitus. Respiratory: No asthma, positive COPD, pneumonias. Positive shortness of breath. No hemoptysis. Cardiovascular: No chest pain, palpitation, positive leg edema. Gastrointestinal: No nausea, vomiting, diarrhea, constipation. No GI bleed. No hepatitis. Genitourinary: No dysuria, hematuria, kidney stone. No incontinance. Neurological: No headache, stroke, seizures.  Psychiatry: No psych facility admission for anxiety, depression, suicide. No detox. Skin: No rash. Musculoskeletal: Positive joint pain, no fibromyalgia. No neck pain, back pain. Lymphadenopathy: No lymphadenopathy. Hematology: No anemia or easy bruising.   Blood pressure (!) 124/96, pulse 81, temperature 98.4 F (36.9 C), temperature source Oral, resp. rate (!) 21, height 5\' 1"  (1.549 m), weight 73 kg, SpO2 96%. Body mass index is 30.41 kg/m. General appearance: alert, cooperative, appears stated age and mild respiratory distress Head: Normocephalic, atraumatic. Eyes: Brown eyes, pink conjunctiva, corneas clear.  Neck: No adenopathy, no carotid bruit, no JVD, supple, symmetrical, trachea midline and thyroid not enlarged. Resp: Clearing to auscultation bilaterally. Cardio: Regular rate and rhythm, S1, S2 normal, II/VI systolic murmur, no click, rub or gallop GI: Soft, non-tender; bowel sounds normal; no organomegaly. Extremities: 1 + feet and lower leg edema, no cyanosis or clubbing. Skin: Warm and dry.  Neurologic: Alert and oriented X 3, normal strength.   Assessment/Plan Acute coronary syndrome Paroxysmal atrial fibrillation, CHA2DS2VASc score of 4 HTN HCM, non-obstructive LBBB  Plan: Change amlodipine to diltiazem. Add small dose metoprolol. Add small dose Imdur. Repeat Troponin I, if upward trend then consider cardiac catheterization. If flat or downward  then consider medical treatment. Check protein electrophoresis. Start Eliquis now or in 1 week post hip surgery.  Time spent: Review of old records, Lab, x-rays, EKG, other cardiac tests, examination, discussion with patient/Family/Doctor over 70 minutes.  Ricki Rodriguez, MD  02/09/2023, 9:51 PM

## 2023-02-09 NOTE — Telephone Encounter (Signed)
Pt. Calling about breathing issues since she had her PFT done today please advise pt.

## 2023-02-09 NOTE — Progress Notes (Signed)
ANTICOAGULATION CONSULT NOTE - Initial Consult  Pharmacy Consult for heparin Indication: chest pain/ACS  Allergies  Allergen Reactions   Codeine Nausea And Vomiting and Palpitations    Chest pains, acid reflux  Other reaction(s): Nausea and/or Vomiting, Other, Other (See Comments), Other (See Comments) Chest pain and acid reflux Chest pains, acid reflux  Codeine made her feel like she was having a heart attack. Chest pain Feels like she is having a heart attack. Feels like she is having a heart attack. Codeine made her feel like she was having a heart attack. Chest pain Feels like she is having a heart attack. Chest pain Feels like she is having a heart attack. Chest pains, acid reflux    Gluten Meal Diarrhea and Other (See Comments)    Severe diarrhea per member    Dust Mite Extract     Upper Respiratory issues    Gabapentin     Eyes went numb and blurry   Sulfa Antibiotics Other (See Comments)   Terbinafine Hcl Rash    Patient Measurements: Height: 5\' 1"  (154.9 cm) Weight: 73 kg (160 lb 15 oz) IBW/kg (Calculated) : 47.8 Heparin Dosing Weight: 64kg  Vital Signs: Temp: 98.4 F (36.9 C) (09/10 1852) Temp Source: Oral (09/10 1852) BP: 124/96 (09/10 2115) Pulse Rate: 81 (09/10 2115)  Labs: Recent Labs    02/09/23 1913  HGB 12.2  HCT 37.2  PLT 252  CREATININE 0.64  TROPONINIHS 42*    Estimated Creatinine Clearance: 55.5 mL/min (by C-G formula based on SCr of 0.64 mg/dL).   Medical History: Past Medical History:  Diagnosis Date   Abnormal glucose 03/06/2020   Age-related osteoporosis without current pathological fracture 11/22/2015   Overview:  Femoral neck  Formatting of this note might be different from the original. 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 Added automatically from request for surgery [id number]   Chronic pain syndrome 04/01/2015   Colon polyps    Essential hypertension 07/20/2000   Overview:  HBP   Female cystocele 02/27/2009   GAD (generalized anxiety disorder) 08/19/2015   GERD (gastroesophageal reflux disease)    History of Bell's palsy 03/21/2015   Hx of adenomatous colonic polyps 12/20/2008   Overview:  Colonoscopy 07/2003 - [facility] - polyps removed - recommended repeat in 3 years Colonoscopy 09/2011 - Dr. Darrol Jump - hyperplastic polyp(s) were removed - repeat in 5 years   Hypertension    Incomplete uterovaginal prolapse 03/04/2004   Insomnia    Left bundle-branch block 05/20/2006   Lumbar radiculopathy 01/09/2016   Formatting of this note might be different from the original. 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 Femoral neck   Other intervertebral disc degeneration, lumbar region 01/29/2015   Pre-operative cardiovascular examination 09/03/2020   Primary osteoarthritis, right hand 02/28/2002   Overview:  OA HANDS, ESP THUMBS   Radial tunnel syndrome, right 11/04/2016  Radiculopathy, lumbar region 11/22/2014   Overview:  Added automatically from request for surgery [id number]   Rhinitis, allergic 02/24/2002   Overview:  ALLERGY SYMPTOMS - states was seen by allergist in past and informed allergic to dust   S/P left total hip arthroplasty 09/17/2020   Sacroiliitis (HCC) 08/02/2019   Screening for colon cancer 04/17/2005   Formatting of this note might be different from the original. Colonoscopy 2005   Sensorineural  hearing loss (SNHL) of both ears 07/05/2019   pt can hear normally   Short sleeper 04/29/2020   Snoring 04/29/2020   Spinal stenosis    Spinal stenosis, lumbar region without neurogenic claudication 11/22/2014   Tinnitus aurium, bilateral 07/05/2019   Trigger thumb, unspecified thumb 02/28/2002   Formatting of this note might be different from the original. TRIGGER THUMBS - started spring 2003. Injected 02/28/02   Unspecified asthma, uncomplicated 12/25/2004     Assessment: 76 YOF presenting with SOB, elevated troponin, she is not on anticoagulation PTA, CBC wnl  Goal of Therapy:  Heparin level 0.3-0.7 units/ml Monitor platelets by anticoagulation protocol: Yes   Plan:  Heparin 4000 units IV x 1, and gtt at 800 units/hr F/u 8 hour heparin level  Daylene Posey, PharmD, [facility] Clinical Pharmacist ED Pharmacist Phone # [phone] 02/09/2023 9:58 PM

## 2023-02-09 NOTE — Telephone Encounter (Signed)
Patient called in to our office reporting increased shortness of breath after her PFT's earlier today. Reports walking into the house and feeling chest pressure that is associated with the shortness of breath. She tells me over the phone that symptoms are different than prior. Given the new nature of symptoms and associated neck pressure, I instructed the patient to present to the nearest ED for cardiac workup.  Raechel Chute, MD [facility] 02/09/2023 5:12 PM

## 2023-02-09 NOTE — ED Triage Notes (Signed)
Patient BIB [facility] from home for exertional SOB that got worse today and now includes SOB at rest as well. EMS reports lung sounds clear, 95% RA. EMS reports EKG showed irregular HR 100-130, unsure if history of afib.

## 2023-02-09 NOTE — Telephone Encounter (Signed)
She had PFT at 1:00pm. When she got home and walked a flight of stairs to get into her house she was SOB. Her shortness of breath has not stopped. This is increased from her normal. She tried using her Albuterol inhaler and it did not help.   I called the patient back. I confirmed she did hear that he recommends her to go to the nearest ER. I asked if she had someone to take her. She said she could drive herself and she would go.  Nothing further needed.

## 2023-02-10 ENCOUNTER — Observation Stay (HOSPITAL_COMMUNITY): Payer: MEDICARE

## 2023-02-10 DIAGNOSIS — I249 Acute ischemic heart disease, unspecified: Secondary | ICD-10-CM | POA: Diagnosis not present

## 2023-02-10 LAB — CBC
HCT: 40.2 % (ref 36.0–46.0)
Hemoglobin: 13.2 g/dL (ref 12.0–15.0)
MCH: 28 pg (ref 26.0–34.0)
MCHC: 32.8 g/dL (ref 30.0–36.0)
MCV: 85.4 fL (ref 80.0–100.0)
Platelets: 312 10*3/uL (ref 150–400)
RBC: 4.71 MIL/uL (ref 3.87–5.11)
RDW: 13.2 % (ref 11.5–15.5)
WBC: 7.8 10*3/uL (ref 4.0–10.5)
nRBC: 0 % (ref 0.0–0.2)

## 2023-02-10 LAB — BASIC METABOLIC PANEL
Anion gap: 10 (ref 5–15)
BUN: 14 mg/dL (ref 8–23)
CO2: 28 mmol/L (ref 22–32)
Calcium: 9.6 mg/dL (ref 8.9–10.3)
Chloride: 99 mmol/L (ref 98–111)
Creatinine, Ser: 0.62 mg/dL (ref 0.44–1.00)
GFR, Estimated: >60 mL/min (ref >60)
Glucose, Bld: 104 mg/dL — ABNORMAL HIGH (ref 70–99)
Potassium: 3.4 mmol/L — ABNORMAL LOW (ref 3.5–5.1)
Sodium: 137 mmol/L (ref 135–145)

## 2023-02-10 LAB — LIPID PANEL
Cholesterol: 175 mg/dL (ref 0–200)
HDL: 42 mg/dL (ref >40)
LDL Cholesterol: 96 mg/dL (ref 0–99)
Total CHOL/HDL Ratio: 4.2 ratio
Triglycerides: 186 mg/dL — ABNORMAL HIGH (ref <150)
VLDL: 37 mg/dL (ref 0–40)

## 2023-02-10 LAB — HEPARIN LEVEL (UNFRACTIONATED)
Heparin Unfractionated: 0.19 [IU]/mL — ABNORMAL LOW (ref 0.30–0.70)
Heparin Unfractionated: 0.35 [IU]/mL (ref 0.30–0.70)

## 2023-02-10 MED ORDER — REGADENOSON 0.4 MG/5ML IV SOLN
0.4000 mg | Freq: Once | INTRAVENOUS | Status: AC
  Administered 2023-02-10: 0.4 mg via INTRAVENOUS
  Filled 2023-02-10: qty 5

## 2023-02-10 MED ORDER — APIXABAN 5 MG PO TABS
5.0000 mg | ORAL_TABLET | Freq: Two times a day (BID) | ORAL | Status: DC
  Administered 2023-02-10 – 2023-02-11 (×2): 5 mg via ORAL
  Filled 2023-02-10 (×2): qty 1

## 2023-02-10 MED ORDER — TECHNETIUM TC 99M TETROFOSMIN IV KIT
10.9000 | PACK | Freq: Once | INTRAVENOUS | Status: AC | PRN
  Administered 2023-02-10: 10.9 via INTRAVENOUS

## 2023-02-10 MED ORDER — REGADENOSON 0.4 MG/5ML IV SOLN
INTRAVENOUS | Status: AC
  Filled 2023-02-10: qty 5

## 2023-02-10 MED ORDER — HEPARIN BOLUS VIA INFUSION
2000.0000 [IU] | Freq: Once | INTRAVENOUS | Status: AC
  Administered 2023-02-10: 2000 [IU] via INTRAVENOUS
  Filled 2023-02-10: qty 2000

## 2023-02-10 MED ORDER — HYDROCODONE-ACETAMINOPHEN 5-325 MG PO TABS
1.0000 | ORAL_TABLET | Freq: Four times a day (QID) | ORAL | Status: DC | PRN
  Administered 2023-02-10 – 2023-02-11 (×5): 1 via ORAL
  Filled 2023-02-10 (×5): qty 1

## 2023-02-10 MED ORDER — TECHNETIUM TC 99M TETROFOSMIN IV KIT
32.7000 | PACK | Freq: Once | INTRAVENOUS | Status: AC | PRN
  Administered 2023-02-10: 32.7 via INTRAVENOUS

## 2023-02-10 NOTE — Patient Instructions (Signed)
SURGICAL WAITING ROOM VISITATION  Patients having surgery or a procedure may have no more than 2 support people in the waiting area - these visitors may rotate.    Children under the age of 40 must have an adult with them who is not the patient.  Due to an increase in RSV and influenza rates and associated hospitalizations, children ages 58 and under may not visit patients in [facility] hospitals.  If the patient needs to stay at the hospital during part of their recovery, the visitor guidelines for inpatient rooms apply. Pre-op nurse will coordinate an appropriate time for 1 support [place] to accompany patient in pre-op.  This support [place] may not rotate.    Please refer to the [facility] website for the visitor guidelines for Inpatients (after your surgery is over and you are in a regular room).    Your procedure is scheduled on: 02/18/23   Report to [facility] Main Entrance    Report to admitting at 5:15 AM   Call this number if you have problems the morning of surgery [phone]   Do not eat food :After Midnight.   After Midnight you may have the following liquids until 4:30 AM DAY OF SURGERY  Water Non-Citrus Juices (without pulp, NO RED-Apple, White grape, White cranberry) Black Coffee (NO MILK/CREAM OR CREAMERS, sugar ok)  Clear Tea (NO MILK/CREAM OR CREAMERS, sugar ok) regular and decaf                             Plain Jell-O (NO RED)                                           Fruit ices (not with fruit pulp, NO RED)                                     Popsicles (NO RED)                                                               Sports drinks like Gatorade (NO RED)                  The day of surgery:  Drink ONE (1) Pre-Surgery Clear Ensure at 4:30 AM the morning of surgery. Drink in one sitting. Do not sip.  This drink was given to you during your hospital  pre-op appointment visit. Nothing else to drink after completing the  Pre-Surgery Clear  Ensure.          If you have questions, please contact your surgeon's office.   FOLLOW BOWEL PREP AND ANY ADDITIONAL PRE OP INSTRUCTIONS YOU RECEIVED FROM YOUR SURGEON'S OFFICE!!!     Oral Hygiene is also important to reduce your risk of infection.                                    Remember - BRUSH YOUR TEETH THE MORNING OF SURGERY WITH YOUR REGULAR TOOTHPASTE  DENTURES  WILL BE REMOVED PRIOR TO SURGERY PLEASE DO NOT APPLY "Poly grip" OR ADHESIVES!!!   Do NOT smoke after Midnight   Stop all vitamins and herbal supplements 7 days before surgery.   Take these medicines the morning of surgery with A SIP OF WATER: Inhalers, Amlodipine, Claritin   DO NOT TAKE ANY ORAL DIABETIC MEDICATIONS DAY OF YOUR SURGERY  Bring CPAP mask and tubing day of surgery.                              You may not have any metal on your body including hair pins, jewelry, and body piercing             Do not wear make-up, lotions, powders, perfumes, or deodorant  Do not wear nail polish including gel and S&S, artificial/acrylic nails, or any other type of covering on natural nails including finger and toenails. If you have artificial nails, gel coating, etc. that needs to be removed by a nail salon please have this removed prior to surgery or surgery may need to be canceled/ delayed if the surgeon/ anesthesia feels like they are unable to be safely monitored.   Do not shave  48 hours prior to surgery.    Do not bring valuables to the hospital. [facility] IS NOT             RESPONSIBLE   FOR VALUABLES.   Contacts, glasses, dentures or bridgework may not be worn into surgery.   Bring small overnight bag day of surgery.   DO NOT BRING YOUR HOME MEDICATIONS TO THE HOSPITAL. PHARMACY WILL DISPENSE MEDICATIONS LISTED ON YOUR MEDICATION LIST TO YOU DURING YOUR ADMISSION IN THE HOSPITAL!   Special Instructions: Bring a copy of your healthcare power of attorney and living will documents the day of surgery if you  haven't scanned them before.              Please read over the following fact sheets you were given: IF YOU HAVE QUESTIONS ABOUT YOUR PRE-OP INSTRUCTIONS PLEASE CALL [phone]   If you received a COVID test during your pre-op visit  it is requested that you wear a mask when out in public, stay away from anyone that may not be feeling well and notify your surgeon if you develop symptoms. If you test positive for Covid or have been in contact with anyone that has tested positive in the last 10 days please notify you surgeon.      Pre-operative 5 CHG Bath Instructions   You can play a key role in reducing the risk of infection after surgery. Your skin needs to be as free of germs as possible. You can reduce the number of germs on your skin by washing with CHG (chlorhexidine gluconate) soap before surgery. CHG is an antiseptic soap that kills germs and continues to kill germs even after washing.   DO NOT use if you have an allergy to chlorhexidine/CHG or antibacterial soaps. If your skin becomes reddened or irritated, stop using the CHG and notify one of our RNs at [phone].   Please shower with the CHG soap starting 4 days before surgery using the following schedule:     Please keep in mind the following:  DO NOT shave, including legs and underarms, starting the day of your first shower.   You may shave your face at any point before/day of surgery.  Place clean sheets on your bed  the day you start using CHG soap. Use a clean washcloth (not used since being washed) for each shower. DO NOT sleep with pets once you start using the CHG.   CHG Shower Instructions:  If you choose to wash your hair and private area, wash first with your normal shampoo/soap.  After you use shampoo/soap, rinse your hair and body thoroughly to remove shampoo/soap residue.  Turn the water OFF and apply about 3 tablespoons (45 ml) of CHG soap to a CLEAN washcloth.  Apply CHG soap ONLY FROM YOUR NECK  DOWN TO YOUR TOES (washing for 3-5 minutes)  DO NOT use CHG soap on face, private areas, open wounds, or sores.  Pay special attention to the area where your surgery is being performed.  If you are having back surgery, having someone wash your back for you may be helpful. Wait 2 minutes after CHG soap is applied, then you may rinse off the CHG soap.  Pat dry with a clean towel  Put on clean clothes/pajamas   If you choose to wear lotion, please use ONLY the CHG-compatible lotions on the back of this paper.     Additional instructions for the day of surgery: DO NOT APPLY any lotions, deodorants, cologne, or perfumes.   Put on clean/comfortable clothes.  Brush your teeth.  Ask your nurse before applying any prescription medications to the skin.      CHG Compatible Lotions   Aveeno Moisturizing lotion  Cetaphil Moisturizing Cream  Cetaphil Moisturizing Lotion  Clairol Herbal Essence Moisturizing Lotion, Dry Skin  Clairol Herbal Essence Moisturizing Lotion, Extra Dry Skin  Clairol Herbal Essence Moisturizing Lotion, Normal Skin  Curel Age Defying Therapeutic Moisturizing Lotion with Alpha Hydroxy  Curel Extreme Care Body Lotion  Curel Soothing Hands Moisturizing Hand Lotion  Curel Therapeutic Moisturizing Cream, Fragrance-Free  Curel Therapeutic Moisturizing Lotion, Fragrance-Free  Curel Therapeutic Moisturizing Lotion, Original Formula  Eucerin Daily Replenishing Lotion  Eucerin Dry Skin Therapy Plus Alpha Hydroxy Crme  Eucerin Dry Skin Therapy Plus Alpha Hydroxy Lotion  Eucerin Original Crme  Eucerin Original Lotion  Eucerin Plus Crme Eucerin Plus Lotion  Eucerin TriLipid Replenishing Lotion  Keri Anti-Bacterial Hand Lotion  Keri Deep Conditioning Original Lotion Dry Skin Formula Softly Scented  Keri Deep Conditioning Original Lotion, Fragrance Free Sensitive Skin Formula  Keri Lotion Fast Absorbing Fragrance Free Sensitive Skin Formula  Keri Lotion Fast Absorbing Softly  Scented Dry Skin Formula  Keri Original Lotion  Keri Skin Renewal Lotion Keri Silky Smooth Lotion  Keri Silky Smooth Sensitive Skin Lotion  Nivea Body Creamy Conditioning Oil  Nivea Body Extra Enriched Teacher, adult education Moisturizing Lotion Nivea Crme  Nivea Skin Firming Lotion  NutraDerm 30 Skin Lotion  NutraDerm Skin Lotion  NutraDerm Therapeutic Skin Cream  NutraDerm Therapeutic Skin Lotion  ProShield Protective Hand Cream  Provon moisturizing lotion  WHAT IS A BLOOD TRANSFUSION? Blood Transfusion Information  A transfusion is the replacement of blood or some of its parts. Blood is made up of multiple cells which provide different functions. Red blood cells carry oxygen and are used for blood loss replacement. White blood cells fight against infection. Platelets control bleeding. Plasma helps clot blood. Other blood products are available for specialized needs, such as hemophilia or other clotting disorders. BEFORE THE TRANSFUSION  Who gives blood for transfusions?  Healthy volunteers who are fully evaluated to make sure their blood is safe. This is blood bank blood. Transfusion therapy is the safest  it has ever been in the practice of medicine. Before blood is taken from a donor, a complete history is taken to make sure that [place] has no history of diseases nor engages in risky social behavior (examples are intravenous drug use or sexual activity with multiple partners). The donor's travel history is screened to minimize risk of transmitting infections, such as malaria. The donated blood is tested for signs of infectious diseases, such as HIV and hepatitis. The blood is then tested to be sure it is compatible with you in order to minimize the chance of a transfusion reaction. If you or a relative donates blood, this is often done in anticipation of surgery and is not appropriate for emergency situations. It takes many days to process the donated  blood. RISKS AND COMPLICATIONS Although transfusion therapy is very safe and saves many lives, the main dangers of transfusion include:  Getting an infectious disease. Developing a transfusion reaction. This is an allergic reaction to something in the blood you were given. Every precaution is taken to prevent this. The decision to have a blood transfusion has been considered carefully by your caregiver before blood is given. Blood is not given unless the benefits outweigh the risks. AFTER THE TRANSFUSION Right after receiving a blood transfusion, you will usually feel much better and more energetic. This is especially true if your red blood cells have gotten low (anemic). The transfusion raises the level of the red blood cells which carry oxygen, and this usually causes an energy increase. The nurse administering the transfusion will monitor you carefully for complications. HOME CARE INSTRUCTIONS  No special instructions are needed after a transfusion. You may find your energy is better. Speak with your caregiver about any limitations on activity for underlying diseases you may have. SEEK MEDICAL CARE IF:  Your condition is not improving after your transfusion. You develop redness or irritation at the intravenous (IV) site. SEEK IMMEDIATE MEDICAL CARE IF:  Any of the following symptoms occur over the next 12 hours: Shaking chills. You have a temperature by mouth above 102 F (38.9 C), not controlled by medicine. Chest, back, or muscle pain. People around you feel you are not acting correctly or are confused. Shortness of breath or difficulty breathing. Dizziness and fainting. You get a rash or develop hives. You have a decrease in urine output. Your urine turns a dark color or changes to pink, red, or brown. Any of the following symptoms occur over the next 10 days: You have a temperature by mouth above 102 F (38.9 C), not controlled by medicine. Shortness of breath. Weakness after  normal activity. The white part of the eye turns yellow (jaundice). You have a decrease in the amount of urine or are urinating less often. Your urine turns a dark color or changes to pink, red, or brown. Document Released: 05/15/2000 Document Revised: 08/10/2011 Document Reviewed: 01/02/2008 Exit[employer] Patient Information 2014 [place], [place].  _______________________________________________________________________  Incentive Spirometer  An incentive spirometer is a tool that can help keep your lungs clear and active. This tool measures how well you are filling your lungs with each breath. Taking long deep breaths may help reverse or decrease the chance of developing breathing (pulmonary) problems (especially infection) following: A long period of time when you are unable to move or be active. BEFORE THE PROCEDURE  If the spirometer includes an indicator to show your best effort, your nurse or respiratory therapist will set it to a desired goal. If possible, sit up straight or  lean slightly forward. Try not to slouch. Hold the incentive spirometer in an upright position. INSTRUCTIONS FOR USE  Sit on the edge of your bed if possible, or sit up as far as you can in bed or on a chair. Hold the incentive spirometer in an upright position. Breathe out normally. Place the mouthpiece in your mouth and seal your lips tightly around it. Breathe in slowly and as deeply as possible, raising the piston or the ball toward the top of the column. Hold your breath for 3-5 seconds or for as long as possible. Allow the piston or ball to fall to the bottom of the column. Remove the mouthpiece from your mouth and breathe out normally. Rest for a few seconds and repeat Steps 1 through 7 at least 10 times every 1-2 hours when you are awake. Take your time and take a few normal breaths between deep breaths. The spirometer may include an indicator to show your best effort. Use the indicator as a goal to work toward  during each repetition. After each set of 10 deep breaths, practice coughing to be sure your lungs are clear. If you have an incision (the cut made at the time of surgery), support your incision when coughing by placing a pillow or rolled up towels firmly against it. Once you are able to get out of bed, walk around indoors and cough well. You may stop using the incentive spirometer when instructed by your caregiver.  RISKS AND COMPLICATIONS Take your time so you do not get dizzy or light-headed. If you are in pain, you may need to take or ask for pain medication before doing incentive spirometry. It is harder to take a deep breath if you are having pain. AFTER USE Rest and breathe slowly and easily. It can be helpful to keep track of a log of your progress. Your caregiver can provide you with a simple table to help with this. If you are using the spirometer at home, follow these instructions: SEEK MEDICAL CARE IF:  You are having difficultly using the spirometer. You have trouble using the spirometer as often as instructed. Your pain medication is not giving enough relief while using the spirometer. You develop fever of 100.5 F (38.1 C) or higher. SEEK IMMEDIATE MEDICAL CARE IF:  You cough up bloody sputum that had not been present before. You develop fever of 102 F (38.9 C) or greater. You develop worsening pain at or near the incision site. MAKE SURE YOU:  Understand these instructions. Will watch your condition. Will get help right away if you are not doing well or get worse. Document Released: 09/28/2006 Document Revised: 08/10/2011 Document Reviewed: 11/29/2006 [facility] Patient Information 2014 [place], [place].   ________________________________________________________________________

## 2023-02-10 NOTE — ED Notes (Signed)
ED TO INPATIENT HANDOFF REPORT  ED Nurse Name and Phone #: Morrie Sheldon [phone]  S Name/Age/Gender\ Steward Drone A Canino 77 y.o. female Room/Bed: 037C/037C  Code Status   Code Status: Full Code  Home/SNF/Other Home Patient oriented to: self, place, time, and situation Is this baseline? Yes   Triage Complete: Triage complete  Chief Complaint Acute coronary syndrome [facility]) [I24.9]  Triage Note Patient BIB [facility] from home for exertional SOB that got worse today and now includes SOB at rest as well. EMS reports lung sounds clear, 95% RA. EMS reports EKG showed irregular HR 100-130, unsure if history of afib.    Allergies Allergies  Allergen Reactions   Codeine Nausea And Vomiting and Palpitations    Chest pains, acid reflux  Other reaction(s): Nausea and/or Vomiting, Other, Other (See Comments), Other (See Comments) Chest pain and acid reflux Chest pains, acid reflux  Codeine made her feel like she was having a heart attack. Chest pain Feels like she is having a heart attack. Feels like she is having a heart attack. Codeine made her feel like she was having a heart attack. Chest pain Feels like she is having a heart attack. Chest pain Feels like she is having a heart attack. Chest pains, acid reflux    Gluten Meal Diarrhea and Other (See Comments)    Severe diarrhea per member    Dust Mite Extract     Upper Respiratory issues    Gabapentin     Eyes went numb and blurry   Sulfa Antibiotics Other (See Comments)   Terbinafine Hcl Rash    Level of Care/Admitting Diagnosis ED Disposition     ED Disposition  Admit   Condition  --   Comment  Hospital Area: [facility] [[id number]]  Level of Care: Telemetry Cardiac [[id number]]  May place patient in observation at [facility] or [facility] if equivalent level of care is available:: No  Covid Evaluation: Asymptomatic - no recent exposure (last 10 days) testing not required  Diagnosis: Acute coronary syndrome [facility])  [[id number]]  Admitting Physician: [name] [[id number]]  Attending Physician: [name] [[id number]]          B Medical/Surgery History Past Medical History:  Diagnosis Date   Abnormal glucose 03/06/2020   Age-related osteoporosis without current pathological fracture 11/22/2015   Overview:  Femoral neck  Formatting of this note might be different from the original. 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 Added automatically from request for surgery [id number]   Chronic pain syndrome 04/01/2015   Colon polyps    Essential hypertension 07/20/2000   Overview:  HBP   Female cystocele 02/27/2009   GAD (generalized anxiety disorder) 08/19/2015   GERD (gastroesophageal reflux disease)    History of Bell's palsy 03/21/2015   Hx of adenomatous colonic polyps 12/20/2008   Overview:  Colonoscopy 07/2003 - [facility]MD - polyps removed - recommended repeat in 3 years Colonoscopy 09/2011 - Dr. Darrol Jump - hyperplastic polyp(s) were removed - repeat in 5 years   Hypertension    Incomplete uterovaginal prolapse 03/04/2004   Insomnia    Left bundle-branch block 05/20/2006   Lumbar  radiculopathy 01/09/2016   Formatting of this note might be different from the original. 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 Femoral neck   Other intervertebral disc degeneration, lumbar region 01/29/2015   Pre-operative  cardiovascular examination 09/03/2020   Primary osteoarthritis, right hand 02/28/2002   Overview:  OA HANDS, ESP THUMBS   Radial tunnel syndrome, right 11/04/2016   Radiculopathy, lumbar region 11/22/2014   Overview:  Added automatically from request for surgery [id number]   Rhinitis, allergic 02/24/2002   Overview:  ALLERGY SYMPTOMS - states was seen by allergist in past and informed allergic to dust   S/P left total hip arthroplasty 09/17/2020   Sacroiliitis (HCC) 08/02/2019   Screening for colon cancer 04/17/2005   Formatting of this note might be different from the original. 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 TRIGGER THUMBS - started spring 2003. Injected 02/28/02   Unspecified asthma, uncomplicated 12/25/2004   Past Surgical History:  Procedure Laterality Date   ABDOMINAL HYSTERECTOMY  2010   APPENDECTOMY  1969   CHOLECYSTECTOMY  1969   SPINAL FUSION  01/2016   TONSILLECTOMY AND ADENOIDECTOMY  1956   TOTAL HIP ARTHROPLASTY Left 09/17/2020   Procedure: TOTAL HIP ARTHROPLASTY ANTERIOR APPROACH;  Surgeon: Durene Romans, MD;  Location: [facility];  Service: Orthopedics;  Laterality: Left;  70 mins     A IV Location/Drains/Wounds Patient Lines/Drains/Airways Status     Active Line/Drains/Airways     Name Placement date Placement time Site Days   Peripheral IV 02/09/23 20 G Left Antecubital 02/09/23  1852  Antecubital  1            Intake/Output Last 24 hours  Intake/Output Summary (Last 24 hours) at 02/10/2023 1150 Last data filed at 02/10/2023 0901 Gross per 24 hour  Intake 127.72 ml  Output --  Net 127.72 ml     Labs/Imaging Results for orders placed or performed during the hospital encounter of 02/09/23 (from the past 48 hour(s))  Basic metabolic panel     Status: Abnormal   Collection Time: 02/09/23  7:13 PM  Result Value Ref Range   Sodium 139 135 - 145 mmol/L   Potassium 3.6 3.5 - 5.1 mmol/L    Comment: HEMOLYSIS AT THIS LEVEL MAY AFFECT RESULT   Chloride 102 98 - 111 mmol/L   CO2 20 (L) 22 - 32 mmol/L   Glucose, Bld 105 (H) 70 - 99 mg/dL    Comment: Glucose reference range applies only to samples taken after fasting for at least 8 hours.   BUN 15 8 - 23 mg/dL   Creatinine, Ser 4.40 0.44 - 1.00 mg/dL   Calcium 9.3 8.9 - 34.7 mg/dL   GFR, Estimated >42 >59 mL/min    Comment: (NOTE) Calculated using the CKD-EPI Creatinine Equation (2021)    Anion gap 17 (H) 5 - 15    Comment: Performed at [facility], [address] [place], [place] [address]  Magnesium     Status: None   Collection Time: 02/09/23  7:13 PM  Result Value Ref Range   Magnesium 2.1 1.7 - 2.4 mg/dL    Comment: Performed at [facility], [address] [place], [place] [address]  CBC     Status: None   Collection Time: 02/09/23  7:13 PM  Result Value Ref Range   WBC 9.2 4.0 - 10.5 K/uL   RBC 4.38 3.87 - 5.11 MIL/uL   Hemoglobin 12.2 12.0 - 15.0 g/dL   HCT 28.4 13.2 - 44.0 %   MCV 84.9 80.0 - 100.0 fL   MCH 27.9 26.0 - 34.0 pg   MCHC 32.8 30.0 - 36.0 g/dL   RDW 10.2 72.5 - 36.6 %   Platelets 252 150 - 400 K/uL   nRBC 0.0 0.0 - 0.2 %    Comment: Performed at [facility], [address] [place], [place] [address]  Brain natriuretic peptide (IF shortness of breath has been documented this visit)     Status: None   Collection Time: 02/09/23  7:13 PM  Result Value Ref Range   B Natriuretic Peptide 45.2 0.0 - 100.0 pg/mL    Comment: Performed at [facility], [address] [place], [place] [address]  Troponin I (High Sensitivity)     Status: Abnormal   Collection Time: 02/09/23  7:13  PM  Result Value Ref Range   Troponin I (High Sensitivity) 42 (H) <18 ng/L    Comment: (NOTE) Elevated high sensitivity troponin I (hsTnI) values and significant  changes across serial measurements may suggest ACS but many other  chronic and acute conditions are known to elevate hsTnI results.  Refer to the "Links" section for chest pain algorithms and additional  guidance. Performed at [facility], [address] [place], [place] [address]   D-dimer, quantitative     Status: Abnormal   Collection Time: 02/09/23  7:13 PM  Result Value Ref Range   D-Dimer, Quant 0.74 (H) 0.00 - 0.50 ug/mL-FEU    Comment: (NOTE) At the manufacturer cut-off value of 0.5 g/mL FEU, this assay has a negative predictive value of 95-100%.This assay is intended for use in conjunction with a clinical pretest probability (PTP) assessment model to exclude pulmonary embolism (PE) and deep venous thrombosis (DVT) in outpatients suspected of PE or DVT. Results should be correlated with clinical presentation. Performed at [facility], [address], [place], [place] [address]   TSH     Status: None   Collection Time: 02/09/23  7:13 PM  Result Value Ref Range   TSH 3.431 0.350 - 4.500 uIU/mL    Comment: Performed by a 3rd Generation assay with a functional sensitivity of <=0.01 uIU/mL. Performed at [facility], [address] [place], [place] [address]   T4, free     Status: None   Collection Time: 02/09/23  7:13 PM  Result Value Ref Range   Free T4 1.00 0.61 - 1.12 ng/dL    Comment: HEMOLYSIS AT THIS LEVEL MAY AFFECT RESULT (NOTE) Biotin ingestion may interfere with free T4 tests. If the results are inconsistent with the TSH level, previous test results, or the clinical presentation, then consider biotin interference. If needed, order repeat testing after stopping biotin. Performed at [facility], [address] [place], [place] [address]   Troponin I (High Sensitivity)      Status:  Abnormal   Collection Time: 02/09/23  9:03 PM  Result Value Ref Range   Troponin I (High Sensitivity) 39 (H) <18 ng/L    Comment: (NOTE) Elevated high sensitivity troponin I (hsTnI) values and significant  changes across serial measurements may suggest ACS but many other  chronic and acute conditions are known to elevate hsTnI results.  Refer to the "Links" section for chest pain algorithms and additional  guidance. Performed at [facility], [address], [place], [place] [address]   Basic metabolic panel     Status: Abnormal   Collection Time: 02/10/23  6:53 AM  Result Value Ref Range   Sodium 137 135 - 145 mmol/L   Potassium 3.4 (L) 3.5 - 5.1 mmol/L   Chloride 99 98 - 111 mmol/L   CO2 28 22 - 32 mmol/L   Glucose, Bld 104 (H) 70 - 99 mg/dL    Comment: Glucose reference range applies only to samples taken after fasting for at least 8 hours.   BUN 14 8 - 23 mg/dL   Creatinine, Ser 2.95 0.44 - 1.00 mg/dL   Calcium 9.6 8.9 - 62.1 mg/dL   GFR, Estimated >30 >86 mL/min    Comment: (NOTE) Calculated using the CKD-EPI Creatinine Equation (2021)    Anion gap 10 5 - 15    Comment: Performed at [facility], [address] [place], [place] [address]  Lipid panel     Status: Abnormal   Collection Time: 02/10/23  6:53 AM  Result Value Ref Range   Cholesterol 175 0 - 200 mg/dL   Triglycerides 962 (H) <150 mg/dL   HDL 42 >95 mg/dL   Total CHOL/HDL Ratio 4.2 RATIO   VLDL 37 0 - 40 mg/dL   LDL Cholesterol 96 0 - 99 mg/dL    Comment:        Total Cholesterol/HDL:CHD Risk Coronary Heart Disease Risk Table                     Men   Women  1/2 Average Risk   3.4   3.3  Average Risk       5.0   4.4  2 X Average Risk   9.6   7.1  3 X Average Risk  23.4   11.0        Use the calculated Patient Ratio above and the CHD Risk Table to determine the patient's CHD Risk.        ATP III CLASSIFICATION (LDL):  <100     mg/dL   Optimal  284-132  mg/dL   Near or  Above                    Optimal  130-159  mg/dL   Borderline  440-102  mg/dL   High  >725     mg/dL   Very High Performed at [facility], [address] [place], [place] [address]   CBC     Status: None   Collection Time: 02/10/23  6:53 AM  Result Value Ref Range   WBC 7.8 4.0 - 10.5 K/uL   RBC 4.71 3.87 - 5.11 MIL/uL   Hemoglobin 13.2 12.0 - 15.0 g/dL   HCT 03.4 74.2 - 59.5 %   MCV 85.4 80.0 - 100.0 fL   MCH 28.0 26.0 - 34.0 pg   MCHC 32.8 30.0 - 36.0 g/dL   RDW 63.8 75.6 - 43.3 %   Platelets 312  150 - 400 K/uL   nRBC 0.0 0.0 - 0.2 %    Comment: Performed at [facility], [address] [place], [place] [address]  Heparin level (unfractionated)     Status: Abnormal   Collection Time: 02/10/23  6:53 AM  Result Value Ref Range   Heparin Unfractionated 0.19 (L) 0.30 - 0.70 IU/mL    Comment: (NOTE) The clinical reportable range upper limit is being lowered to >1.10 to align with the FDA approved guidance for the current laboratory assay.  If heparin results are below expected values, and patient dosage has  been confirmed, suggest follow up testing of antithrombin III levels. Performed at [facility], [address] [place], [place] [address]    DG Chest Port 1 View  Result Date: 02/09/2023 CLINICAL DATA:  Dyspnea EXAM: PORTABLE CHEST 1 VIEW COMPARISON:  12/12/2022 FINDINGS: Lungs are clear save for minimal left basilar atelectasis or linear scarring. No pneumothorax or pleural effusion. Cardiac size within normal limits. Pulmonary vascularity is normal. No acute bone abnormality. IMPRESSION: No active disease. Electronically Signed   By: Helyn Numbers M.D.   On: 02/09/2023 20:03    Pending Labs Unresulted Labs (From admission, onward)     Start     Ordered   02/11/23 0500  Heparin level (unfractionated)  Daily,   R     Placed in "And" Linked Group   02/09/23 2159   02/10/23 1700  Heparin level (unfractionated)  Once-Timed,   TIMED        02/10/23  0850   02/10/23 0500  Lipoprotein A (LPA)  Tomorrow morning,   R        02/09/23 2147   02/10/23 0500  Protein electrophoresis, serum  Tomorrow morning,   R        02/09/23 2151   02/10/23 0500  CBC  Daily,   R     Placed in "And" Linked Group   02/09/23 2159            Vitals/Pain Today's Vitals   02/10/23 0300 02/10/23 0734 02/10/23 0745 02/10/23 0931  BP: 134/72 (!) 133/59    Pulse: 80 73    Resp: (!) 21 20    Temp:  97.9 F (36.6 C)    TempSrc:  Oral    SpO2: 96% 96%    Weight:      Height:      PainSc:   8  3     Isolation Precautions No active isolations  Medications Medications  aspirin EC tablet 81 mg (81 mg Oral Not Given 02/10/23 1111)  nitroGLYCERIN (NITROSTAT) SL tablet 0.4 mg (has no administration in time range)  acetaminophen (TYLENOL) tablet 650 mg (has no administration in time range)  ondansetron (ZOFRAN) injection 4 mg (has no administration in time range)  sodium chloride flush (NS) 0.9 % injection 3 mL (3 mLs Intravenous Patient Refused/Not Given 02/10/23 0902)  sodium chloride flush (NS) 0.9 % injection 3 mL (has no administration in time range)  0.9 %  sodium chloride infusion (has no administration in time range)  0.9 %  sodium chloride infusion ( Intravenous Not Given 02/09/23 2152)  metoprolol tartrate (LOPRESSOR) tablet 25 mg (25 mg Oral Not Given 02/10/23 1111)  albuterol (PROVENTIL) (2.5 MG/3ML) 0.083% nebulizer solution 2.5 mg (has no administration in time range)  ascorbic acid (VITAMIN C) tablet 2,000 mg (2,000 mg Oral Not Given 02/10/23 1112)  polyvinyl alcohol (LIQUIFILM TEARS) 1.4 % ophthalmic solution 1 drop (  Both Eyes Not Given 02/09/23 2219)  magnesium oxide (MAG-OX) tablet 400 mg (400 mg Oral Not Given 02/10/23 1112)  hydrochlorothiazide (HYDRODIURIL) tablet 25 mg (has no administration in time range)  loratadine (CLARITIN) tablet 10 mg (has no administration in time range)  losartan (COZAAR) tablet 50 mg (has no administration in  time range)  acidophilus (RISAQUAD) capsule 1 capsule (1 capsule Oral Not Given 02/09/23 2313)  diltiazem (CARDIZEM SR) 12 hr capsule 120 mg (120 mg Oral Not Given 02/09/23 2316)  rosuvastatin (CRESTOR) tablet 20 mg (20 mg Oral Given 02/09/23 2223)  heparin ADULT infusion 100 units/mL (25000 units/283mL) (1,000 Units/hr Intravenous Rate/Dose Change 02/10/23 0900)  vitamin A capsule 10,000 Units (has no administration in time range)  HYDROcodone-acetaminophen (NORCO/VICODIN) 5-325 MG per tablet 1 tablet (1 tablet Oral Given 02/10/23 0838)  diltiazem (CARDIZEM) injection 20 mg (20 mg Intravenous Given 02/09/23 1908)  HYDROcodone-acetaminophen (NORCO/VICODIN) 5-325 MG per tablet 1 tablet (1 tablet Oral Given 02/09/23 2113)  aspirin chewable tablet 324 mg (324 mg Oral Given 02/09/23 2223)    Or  aspirin suppository 300 mg ( Rectal See Alternative 02/09/23 2223)  heparin bolus via infusion 4,000 Units (4,000 Units Intravenous Bolus from Bag 02/09/23 2215)  heparin bolus via infusion 2,000 Units (2,000 Units Intravenous Bolus from Bag 02/10/23 0901)  technetium tetrofosmin (TC-MYOVIEW) injection 10.9 millicurie (10.9 millicuries Intravenous Contrast Given 02/10/23 1055)    Mobility walks with device     Focused Assessments See chart   R Recommendations: See Admitting Provider Note  Report given to:   Additional Notes: see chart

## 2023-02-10 NOTE — Progress Notes (Signed)
COVID Vaccine Completed: yes  Date of COVID positive in last 90 days:  PCP - Estevan Oaks, NP Cardiologist - Jorge Ny, MD  Chest x-ray - 02/09/23 Epic EKG - 02/10/23 Epic Stress Test - 02/10/23 Epic ECHO - 02/02/23 Epic Cardiac Cath -  Pacemaker/ICD device last checked: Spinal Cord Stimulator:  Bowel Prep -   Sleep Study -  CPAP -   Fasting Blood Sugar -  Checks Blood Sugar _____ times a day  Last dose of GLP1 agonist-  N/A GLP1 instructions:  N/A   Last dose of SGLT-2 inhibitors-  N/A SGLT-2 instructions: N/A   Blood Thinner Instructions:  Time Aspirin Instructions: ASA 81 Last Dose:  Activity level:  Can go up a flight of stairs and perform activities of daily living without stopping and without symptoms of chest pain or shortness of breath.  Able to exercise without symptoms  Unable to go up a flight of stairs without symptoms of     Anesthesia review: NRMR, LVH, HTN, LBBB, dizziness and lightheaded, systolic murmur, SOB, lymphoma   Patient denies shortness of breath, fever, cough and chest pain at PAT appointment  Patient verbalized understanding of instructions that were given to them at the PAT appointment. Patient was also instructed that they will need to review over the PAT instructions again at home before surgery.

## 2023-02-10 NOTE — ED Notes (Signed)
Provided pt with toothbrush & toothpaste

## 2023-02-10 NOTE — Progress Notes (Signed)
ANTICOAGULATION CONSULT NOTE  Pharmacy Consult for heparin Indication: chest pain/ACS  Patient Measurements: Height: 5\' 1"  (154.9 cm) Weight: 73 kg (160 lb 15 oz) IBW/kg (Calculated) : 47.8 Heparin Dosing Weight: 64kg  Vital Signs: Temp: 97.9 F (36.6 C) (09/11 0734) Temp Source: Oral (09/11 0734) BP: 133/59 (09/11 0734) Pulse Rate: 73 (09/11 0734)  Labs: Recent Labs    02/09/23 1913 02/09/23 2103 02/10/23 0653  HGB 12.2  --  13.2  HCT 37.2  --  40.2  PLT 252  --  312  HEPARINUNFRC  --   --  0.19*  CREATININE 0.64  --  0.62  TROPONINIHS 42* 39*  --     Estimated Creatinine Clearance: 55.5 mL/min (by C-G formula based on SCr of 0.62 mg/dL).  Assessment: 7 YOF presenting with SOB, elevated troponin. She was started on IV heparin. She is not on anticoagulation PTA. Initial heparin level is low at 0.19. No bleeding noted. CBC remains WNL.   Goal of Therapy:  Heparin level 0.3-0.7 units/ml Monitor platelets by anticoagulation protocol: Yes   Plan:  Heparin bolus 2000 units IV x 1 Increase heparin gtt to 1000 units/hr Check an 8 hr heparin level Daily heparin level and CBC  Lysle Pearl, PharmD, BCPS, BCEMP Clinical Pharmacist Please see AMION for all pharmacy numbers 02/10/2023 8:52 AM

## 2023-02-10 NOTE — ED Notes (Signed)
Called [facility] patient will be transported to inpatient bed 3E14 when he is done.

## 2023-02-10 NOTE — ED Notes (Signed)
Pt up to chair, resting comfortable with daughter at bedside.

## 2023-02-10 NOTE — Progress Notes (Signed)
Unfractionated heparin 0.35- within goal. No changes made to heparin drip.

## 2023-02-10 NOTE — Progress Notes (Addendum)
ANTICOAGULATION CONSULT NOTE  Pharmacy Consult for heparin>apixaban Indication: chest pain/ACS  Patient Measurements: Height: 5\' 2"  (157.5 cm) Weight: 70.2 kg (154 lb 12.2 oz) IBW/kg (Calculated) : 50.1 Heparin Dosing Weight: 64kg  Vital Signs: Temp: 97.9 F (36.6 C) (09/11 1749) Temp Source: Oral (09/11 1749) BP: 126/62 (09/11 1749) Pulse Rate: 93 (09/11 1749)  Labs: Recent Labs    02/09/23 1913 02/09/23 2103 02/10/23 0653 02/10/23 1843  HGB 12.2  --  13.2  --   HCT 37.2  --  40.2  --   PLT 252  --  312  --   HEPARINUNFRC  --   --  0.19* 0.35  CREATININE 0.64  --  0.62  --   TROPONINIHS 42* 39*  --   --     Estimated Creatinine Clearance: 55.7 mL/min (by C-G formula based on SCr of 0.62 mg/dL).  Assessment: 55 YOF presenting with SOB, elevated troponin. She was started on IV heparin. She is not on anticoagulation PTA. Initial heparin level is low at 0.19. No bleeding noted. CBC remains WNL.   Addendum  Order to transition heparin to apixaban for afib.  Age<80, wt>60kg, scr  <1.5  Goal of Therapy:  Monitor platelets by anticoagulation protocol: Yes   Plan:  Dc heparin Rx will follow peripherally  Ulyses Southward, PharmD, BCIDP, AAHIVP, CPP Infectious Disease Pharmacist 02/10/2023 7:25 PM

## 2023-02-10 NOTE — Progress Notes (Signed)
Ref: Deanna Oaks, NP   Subjective:  Underwent NM myocardial perfusion stress test. No reversible ischemia. VS stable. HR improved with Diltiazem use.  Objective:  Vital Signs in the last 24 hours: Temp:  [97.9 F (36.6 C)-98.3 F (36.8 C)] 98.1 F (36.7 C) (09/11 1952) Pulse Rate:  [66-93] 93 (09/11 1749) Cardiac Rhythm: Normal sinus rhythm (09/11 1900) Resp:  [17-23] 18 (09/11 1952) BP: (109-148)/(59-84) 126/62 (09/11 1749) SpO2:  [94 %-100 %] 95 % (09/11 1749) Weight:  [70.2 kg] 70.2 kg (09/11 1415)  Physical Exam: BP Readings from Last 1 Encounters:  02/10/23 126/62     Wt Readings from Last 1 Encounters:  02/10/23 70.2 kg    Weight change:  Body mass index is 28.31 kg/m. HEENT: /AT, Eyes-Brown, PERL, EOMI, Conjunctiva-Pink, Sclera-Non-icteric Neck: No JVD, No bruit, Trachea midline. Lungs:  Clear, Bilateral. Cardiac:  Regular rhythm, normal S1 and S2, no S3. II/VI systolic murmur. Abdomen:  Soft, non-tender. BS present. Extremities:  No edema present. No cyanosis. No clubbing. CNS: AxOx3, Cranial nerves grossly intact, moves all 4 extremities.  Skin: Warm and dry.   Intake/Output from previous day: No intake/output data recorded.    Lab Results: BMET    Component Value Date/Time   NA 137 02/10/2023 0653   NA 139 02/09/2023 1913   NA 137 12/12/2022 1846   NA 142 03/06/2020 1706   NA 142 08/14/2019 1252   K 3.4 (L) 02/10/2023 0653   K 3.6 02/09/2023 1913   K 3.1 (L) 12/12/2022 1846   CL 99 02/10/2023 0653   CL 102 02/09/2023 1913   CL 99 12/12/2022 1846   CO2 28 02/10/2023 0653   CO2 20 (L) 02/09/2023 1913   CO2 26 12/12/2022 1846   GLUCOSE 104 (H) 02/10/2023 0653   GLUCOSE 105 (H) 02/09/2023 1913   GLUCOSE 137 (H) 12/12/2022 1846   BUN 14 02/10/2023 0653   BUN 15 02/09/2023 1913   BUN 15 12/12/2022 1846   BUN 12 03/06/2020 1706   BUN 11 08/14/2019 1252   CREATININE 0.62 02/10/2023 0653   CREATININE 0.64 02/09/2023 1913   CREATININE  0.70 12/12/2022 1846   CREATININE 0.69 04/03/2021 0825   CREATININE 0.53 03/12/2021 0952   CREATININE 0.76 01/29/2021 1352   CALCIUM 9.6 02/10/2023 0653   CALCIUM 9.3 02/09/2023 1913   CALCIUM 9.8 12/12/2022 1846   GFRNONAA >60 02/10/2023 0653   GFRNONAA >60 02/09/2023 1913   GFRNONAA >60 12/12/2022 1846   GFRNONAA >60 04/03/2021 0825   GFRNONAA >60 03/12/2021 0952   GFRNONAA >60 01/29/2021 1352   GFRAA 104 03/06/2020 1706   GFRAA 103 08/14/2019 1252   CBC    Component Value Date/Time   WBC 7.8 02/10/2023 0653   RBC 4.71 02/10/2023 0653   HGB 13.2 02/10/2023 0653   HGB 13.6 04/03/2021 0825   HGB 11.5 10/07/2020 1130   HCT 40.2 02/10/2023 0653   HCT 34.0 10/07/2020 1130   PLT 312 02/10/2023 0653   PLT 260 04/03/2021 0825   PLT 368 10/07/2020 1130   MCV 85.4 02/10/2023 0653   MCV 82 10/07/2020 1130   MCH 28.0 02/10/2023 0653   MCHC 32.8 02/10/2023 0653   RDW 13.2 02/10/2023 0653   RDW 13.1 10/07/2020 1130   LYMPHSABS 1.4 12/12/2022 1846   MONOABS 1.1 (H) 12/12/2022 1846   EOSABS 0.2 12/12/2022 1846   BASOSABS 0.1 12/12/2022 1846   HEPATIC Function Panel Recent Labs    12/12/22 1846  PROT 7.5  ALBUMIN 4.5  AST 24  ALT 25  ALKPHOS 63   HEMOGLOBIN A1C No results found for: "MPG" CARDIAC ENZYMES No results found for: "CKTOTAL", "CKMB", "CKMBINDEX", "TROPONINI" BNP No results for input(s): "PROBNP" in the last 8760 hours. TSH Recent Labs    02/09/23 1913  TSH 3.431   CHOLESTEROL Recent Labs    02/10/23 0653  CHOL 175    Scheduled Meds:  acidophilus  1 capsule Oral Daily   apixaban  5 mg Oral BID   vitamin C  2,000 mg Oral Daily   aspirin EC  81 mg Oral Daily   diltiazem  120 mg Oral Q12H   hydrochlorothiazide  25 mg Oral Daily   losartan  50 mg Oral Daily   magnesium oxide  400 mg Oral Daily   metoprolol tartrate  25 mg Oral BID   polyvinyl alcohol  1 drop Both Eyes Daily   rosuvastatin  20 mg Oral Daily   sodium chloride flush  3 mL  Intravenous Q12H   vitamin A  10,000 Units Oral Daily   Continuous Infusions:  sodium chloride     sodium chloride     PRN Meds:.sodium chloride, acetaminophen, albuterol, HYDROcodone-acetaminophen, loratadine, nitroGLYCERIN, ondansetron (ZOFRAN) IV, sodium chloride flush  Assessment/Plan: Acute coronary syndrome Paroxysmal atrial fibrillation HTN HCM LBBB Right hip arthritis  Plan: Start Eliquis. DC heparin and aspirin. Increase activity.   LOS: 0 days   Time spent including chart review, lab review, examination, discussion with patient : 30 min   Deanna Cobb  MD  02/10/2023, 9:59 PM

## 2023-02-10 NOTE — Plan of Care (Signed)

## 2023-02-10 NOTE — ED Notes (Signed)
Pt ambulated to rr

## 2023-02-10 NOTE — ED Notes (Signed)
Pt transported off floor for stress test.

## 2023-02-11 ENCOUNTER — Other Ambulatory Visit (HOSPITAL_COMMUNITY): Payer: Self-pay

## 2023-02-11 ENCOUNTER — Encounter (HOSPITAL_COMMUNITY)
Admission: RE | Admit: 2023-02-11 | Discharge: 2023-02-11 | Disposition: A | Discharge status: Home / Self Care | Payer: MEDICARE | Source: Ambulatory Visit | Attending: Orthopedic Surgery | Admitting: Orthopedic Surgery

## 2023-02-11 DIAGNOSIS — Z01812 Encounter for preprocedural laboratory examination: Secondary | ICD-10-CM | POA: Insufficient documentation

## 2023-02-11 DIAGNOSIS — I10 Essential (primary) hypertension: Secondary | ICD-10-CM | POA: Insufficient documentation

## 2023-02-11 DIAGNOSIS — Z01818 Encounter for other preprocedural examination: Secondary | ICD-10-CM

## 2023-02-11 DIAGNOSIS — M169 Osteoarthritis of hip, unspecified: Secondary | ICD-10-CM | POA: Insufficient documentation

## 2023-02-11 DIAGNOSIS — M1611 Unilateral primary osteoarthritis, right hip: Secondary | ICD-10-CM | POA: Insufficient documentation

## 2023-02-11 DIAGNOSIS — I447 Left bundle-branch block, unspecified: Secondary | ICD-10-CM | POA: Insufficient documentation

## 2023-02-11 DIAGNOSIS — I4891 Unspecified atrial fibrillation: Secondary | ICD-10-CM | POA: Insufficient documentation

## 2023-02-11 DIAGNOSIS — I249 Acute ischemic heart disease, unspecified: Secondary | ICD-10-CM | POA: Diagnosis not present

## 2023-02-11 DIAGNOSIS — Z87891 Personal history of nicotine dependence: Secondary | ICD-10-CM | POA: Insufficient documentation

## 2023-02-11 LAB — CBC
HCT: 37.7 % (ref 36.0–46.0)
Hemoglobin: 12.3 g/dL (ref 12.0–15.0)
MCH: 27.9 pg (ref 26.0–34.0)
MCHC: 32.6 g/dL (ref 30.0–36.0)
MCV: 85.5 fL (ref 80.0–100.0)
Platelets: 257 10*3/uL (ref 150–400)
RBC: 4.41 MIL/uL (ref 3.87–5.11)
RDW: 13.1 % (ref 11.5–15.5)
WBC: 6.5 10*3/uL (ref 4.0–10.5)
nRBC: 0 % (ref 0.0–0.2)

## 2023-02-11 LAB — BASIC METABOLIC PANEL
Anion gap: 8 (ref 5–15)
BUN: 16 mg/dL (ref 8–23)
CO2: 26 mmol/L (ref 22–32)
Calcium: 9.1 mg/dL (ref 8.9–10.3)
Chloride: 104 mmol/L (ref 98–111)
Creatinine, Ser: 0.7 mg/dL (ref 0.44–1.00)
GFR, Estimated: >60 mL/min (ref >60)
Glucose, Bld: 114 mg/dL — ABNORMAL HIGH (ref 70–99)
Potassium: 3.9 mmol/L (ref 3.5–5.1)
Sodium: 138 mmol/L (ref 135–145)

## 2023-02-11 LAB — SURGICAL PCR SCREEN
MRSA, PCR: NEGATIVE
Staphylococcus aureus: NEGATIVE

## 2023-02-11 MED ORDER — APIXABAN 5 MG PO TABS: 5.0000 mg | ORAL_TABLET | Freq: Two times a day (BID) | ORAL | 3 refills | Status: DC

## 2023-02-11 MED ORDER — DILTIAZEM HCL ER 120 MG PO CP12: 120.0000 mg | ORAL_CAPSULE | Freq: Two times a day (BID) | ORAL | 6 refills | Status: DC

## 2023-02-11 MED ORDER — METOPROLOL TARTRATE 25 MG PO TABS: 25.0000 mg | ORAL_TABLET | Freq: Two times a day (BID) | ORAL | 6 refills | Status: DC

## 2023-02-11 MED ORDER — NITROGLYCERIN 0.4 MG SL SUBL: 0.4000 mg | SUBLINGUAL_TABLET | SUBLINGUAL | 1 refills | Status: AC | PRN

## 2023-02-11 MED ORDER — ROSUVASTATIN CALCIUM 20 MG PO TABS: 20.0000 mg | ORAL_TABLET | Freq: Every day | ORAL | 6 refills | Status: DC

## 2023-02-11 NOTE — Progress Notes (Signed)
Pt discharged to home in stable condition, all discharge instructions reviewed with and given to pt and daughter. Both give complete verbal understanding of all of the above. Pt and daughter also are aware to hold eliquis from 02/14/2023 to 02/20/2023 for hip surgery. Pt transported off unit via wheelchair with staff x1 at chairside to private vehicle to transport to home.

## 2023-02-11 NOTE — TOC Transition Note (Addendum)
Transition of Care [facility]) - CM/SW Discharge Note   Patient Details  Name: Deanna [name] MRN: [id number] Date of Birth: [birth year]  Transition of Care [facility]) CM/SW Contact:  Leone Haven, RN Phone Number: 02/11/2023, 11:57 AM   Clinical Narrative:    Patient is for dc today, she is set up with [facility] for [facility] services.  She states her daughter will come to transport her home. She will be on eliquis, she has the 30 day free coupon for eliquis. Copay amt is 112.81 for eliquis.  NCM will give her the Eliquis patient asst application to take to her follow  cardiology follow up apt.   Final next level of care: Home w Home Health Services Barriers to Discharge: No Barriers Identified   Patient Goals and CMS Choice CMS Medicare.gov Compare Post Acute Care list provided to:: Patient Choice offered to / list presented to : Patient  Discharge Placement                         Discharge Plan and Services Additional resources added to the After Visit Summary for   In-house Referral: NA Discharge Planning Services: CM Consult Post Acute Care Choice: Home Health          DME Arranged: N/A DME Agency: NA       HH Arranged: RN, Nurse's Aide HH Agency: [facility] Date [facility] Agency Contacted: 02/11/23 Time HH Agency Contacted: 1141 Representative spoke with at [facility] Agency: Kandee Keen  Social Determinants of Health (SDOH) Interventions SDOH Screenings   Food Insecurity: No Food Insecurity (02/10/2023)  Housing: Low Risk  (02/10/2023)  Transportation Needs: No Transportation Needs (02/10/2023)  Utilities: Not At Risk (02/10/2023)  Depression (PHQ2-9): Low Risk  (01/14/2022)  Financial Resource Strain: High Risk (01/14/2022)  Physical Activity: Inactive (01/14/2022)  Social Connections: Unknown (10/13/2021)   Received from [facility], [facility]  Stress: Stress Concern Present (01/14/2022)  Tobacco Use: Medium Risk (02/09/2023)     Readmission Risk Interventions      No data to display

## 2023-02-11 NOTE — Discharge Summary (Signed)
Physician Discharge Summary  Patient ID: Deanna [name] MRN: [id number] DOB/AGE: 1946-02-01 77 y.o.  Admit date: 02/09/2023 Discharge date: 02/11/2023  Admission Diagnoses: Acute coronary syndrome Paroxysmal atrial fibrillation, CHA2DS2VASc score of 4 HTN HCM, non-obstructive LBBB  Discharge Diagnoses:  Principal Problem:   Acute coronary syndrome [facility]) Active problems: Paroxysmal atrial fibrillation HTN HCM LBBB Right hip arthritis  Discharged Condition: stable  Hospital Course: 77 years old black female with PMH of HTN, HCM, LBBB has shortness of breath increasing post PFT on 02/09/2023. She was found in atrial fibrillation with RVR. She responded to 20 mg. of IV diltiazem. Patient has noticed mild lower extremity edema for chronic 10 mg. amlodipine use. This is changed to diltiazem 120 mg. bid. She is awaiting hip surgery in 1 week. Her troponin I was minimally elevated. Her EKG shows sinus rhythm with LBBB. Chest x-ray is unremarkable. Echocardiogram done on 02/02/2023 showed moderate to severe Hypertrophy of LV. She has NM myocardial perfusion test showing no reversible ischemia. She was started on Eliquis 5 mg. Bid. She will hold Eliquis from Sunday to Thursday for her right hip surgery. She will resume it in 2-3 days post surgery if okay with surgeon.  She is cleared for right hip surgery.  Consults: cardiology  Significant Diagnostic Studies: labs: Normal CBC, Near normal BMET and lipid panel except mildly elevated Triglycerides. Minimally elevated troponin I levels. Pending Hgb A1C, Lipoprotein A and Protein electrophoresis for Hypertrophic Cardiomyopathy.  EKG: A.fib with RVR, LBBB. Followed by Sinus rhythm with LBBB.  NM myocardial perfusion stress test: No reversible ischemia.  Treatments: cardiac meds: Eliquis, Losartan, NTG SL, metoprolol, diltiazem, and Rosuvastatin.  Discharge Exam: Blood pressure 130/71, pulse 75, temperature 98.1 F (36.7 C), temperature  source Oral, resp. rate 20, height 5\' 2"  (1.575 m), weight 70.9 kg, SpO2 95%. General appearance: alert, cooperative and appears stated age. Head: Normocephalic, atraumatic. Eyes: Brown eyes, pink conjunctiva, corneas clear.   Neck: No adenopathy, no carotid bruit, no JVD, supple, symmetrical, trachea midline and thyroid not enlarged. Resp: Clear to auscultation bilaterally. Cardio: Regular rate and rhythm, S1, S2 normal, II/VI systolic murmur, no click, rub or gallop. GI: Soft, non-tender; bowel sounds normal; no organomegaly. Extremities: No edema, cyanosis or clubbing. Right hip pain. Skin: Warm and dry.  Neurologic: Alert and oriented X 3, normal strength and tone. Normal coordination, slow gait with right sided limping.  Disposition: Discharge disposition: 01-Home or Self Care        Allergies as of 02/11/2023       Reactions   Codeine Nausea And Vomiting, Palpitations   Chest pains, acid reflux  Other reaction(s): Nausea and/or Vomiting, Other, Other (See Comments), Other (See Comments) Chest pain and acid reflux Chest pains, acid reflux  Codeine made her feel like she was having a heart attack. Chest pain Feels like she is having a heart attack. Feels like she is having a heart attack. Codeine made her feel like she was having a heart attack. Chest pain Feels like she is having a heart attack. Chest pain Feels like she is having a heart attack. Chest pains, acid reflux    Gluten Meal Diarrhea, Other (See Comments)   Severe diarrhea per member   Dust Mite Extract    Upper Respiratory issues    Gabapentin    Eyes went numb and blurry   Sulfa Antibiotics Other (See Comments)   Terbinafine Hcl Rash        Medication List     STOP  taking these medications    ADVIL DUAL ACTION PO   amLODipine 10 MG tablet Commonly known as: NORVASC   diclofenac 75 MG EC tablet Commonly known as: VOLTAREN       TAKE these medications    albuterol 108 (90 Base)  MCG/ACT inhaler Commonly known as: VENTOLIN HFA Inhale 1-2 puffs into the lungs every 6 (six) hours as needed for wheezing or shortness of breath.   apixaban 5 MG Tabs tablet Commonly known as: ELIQUIS Take 1 tablet (5 mg total) by mouth 2 (two) times daily. Hold medication from 9/15 to 9/21 for right hip surgery   CAL-MAG PO Take 2,000 mg by mouth at bedtime. 1000 mg   CLEAR EYES FOR DRY EYES OP Place 1 drop into both eyes daily.   [facility] Magnesium Oxide 250 MG Tabs Generic drug: Magnesium Oxide -Mg Supplement TAKE 1 TABLET (250 MG TOTAL) BY MOUTH EVERY EVENING. TAKE WITH EVENING MEAL What changed: See the new instructions.   diltiazem 120 MG 12 hr capsule Commonly known as: CARDIZEM SR Take 1 capsule (120 mg total) by mouth every 12 (twelve) hours.   furosemide 20 MG tablet Commonly known as: LASIX Take 20 mg by mouth as needed.   hydrochlorothiazide 25 MG tablet Commonly known as: HYDRODIURIL Take 1 tablet (25 mg total) by mouth 2 (two) times daily. What changed: when to take this   loratadine 10 MG tablet Commonly known as: CLARITIN Take 10 mg by mouth daily as needed for allergies.   losartan 50 MG tablet Commonly known as: COZAAR TAKE 1 TABLET BY MOUTH EVERY DAY   metoprolol tartrate 25 MG tablet Commonly known as: LOPRESSOR Take 1 tablet (25 mg total) by mouth 2 (two) times daily.   milk thistle 175 MG tablet Take 175 mg by mouth daily.   nitroGLYCERIN 0.4 MG SL tablet Commonly known as: NITROSTAT Place 1 tablet (0.4 mg total) under the tongue every 5 (five) minutes x 3 doses as needed for chest pain.   PROBIOTIC DAILY PO Take 1 tablet by mouth daily.   rosuvastatin 20 MG tablet Commonly known as: CRESTOR Take 1 tablet (20 mg total) by mouth daily. Start taking on: February 12, 2023   Vitamin A 2400 MCG (8000 UT) Caps Take 16,000 Units by mouth daily. 8000 units   vitamin C 1000 MG tablet Take 4,000 mg by mouth daily.   Vitamin D (Ergocalciferol)  1.25 MG (50000 UNIT) Caps capsule Commonly known as: DRISDOL Take 50,000 Units by mouth once a week.        Follow-up Information     Estevan Oaks, NP Follow up.   Specialty: Nurse Practitioner Why: Please follow up in a week. Contact information: [address] [place] [address] [phone]         [name], MD Follow up in 2 month(s).   Specialty: Cardiology Contact information: [address] [place] [address] [phone]         Care, [facility] Follow up.   Specialty: Home Health Services Why: Agency will contact you to set up apt times Contact information: [address] [place] [address] [phone]                 Time spent: Review of old chart, current chart, lab, x-ray, cardiac tests and discussion with patient over 60 minutes.  Signed: Ricki Rodriguez 02/11/2023, 11:49 AM

## 2023-02-11 NOTE — TOC Initial Note (Addendum)
Transition of Care [facility]) - Initial/Assessment Note    Patient Details  Name: Deanna [name] MRN: [id number] Date of Birth: 11-10-1945  Transition [facility] Care [facility]) CM/SW Contact:    Leone Haven, RN Phone Number: 02/11/2023, 11:42 AM  Clinical Narrative:                 From home alone, has PCP and insurance on file, states has no HH services in place at this time, she has a walker, cane, bsc, and a shower chair at home.  States daughter will transport her home at [employer] and she  is support system, [facility] [address] [facility] [place].  Pta self ambulatory. She will be back on next Thursday for hip repair.   Expected Discharge Plan: Home w Home Health Services Barriers to Discharge: No Barriers Identified   Patient Goals and CMS Choice Patient states their goals for this hospitalization and ongoing recovery are:: return home CMS Medicare.gov Compare Post Acute Care list provided to:: Patient Choice offered to / list presented to : Patient      Expected Discharge Plan and Services In-house Referral: NA Discharge Planning Services: CM Consult Post Acute Care Choice: Home Health Living arrangements for the past 2 months: Single Family Home Expected Discharge Date: 02/11/23               DME Arranged: N/A DME Agency: NA       HH Arranged: RN, Nurse's Aide, PT, OT HH Agency: [facility] Date [facility] Agency Contacted: 02/11/23 Time HH Agency Contacted: 1141 Representative spoke with at [facility] Agency: Kandee Keen  Prior Living Arrangements/Services Living arrangements for the past 2 months: Single Family Home Lives with:: Self Patient language and need for interpreter reviewed:: Yes Do you feel safe going back to the place where you live?: Yes      Need for Family Participation in Patient Care: Yes (Comment) Care giver support system in place?: Yes (comment) Current home services: DME (walker, cane, bsc, showerchair) Criminal Activity/Legal  Involvement Pertinent to Current Situation/Hospitalization: No - Comment as needed  Activities of Daily Living Home Assistive Devices/Equipment: Blood pressure cuff, Shower chair with back, Cane (specify quad or straight), Eyeglasses, Grab bars in shower, Hand-held shower hose, Long-handled shoehorn, Long-handled sponge, Raised toilet seat with rails, Reacher, Walker (specify type) (quad cane,raised toilet seat no rails) ADL Screening (condition at time of admission) Patient's cognitive ability adequate to safely complete daily activities?: Yes Is the patient deaf or have difficulty hearing?: Yes Does the patient have difficulty seeing, even when wearing glasses/contacts?: No Does the patient have difficulty concentrating, remembering, or making decisions?: No Patient able to express need for assistance with ADLs?: Yes Does the patient have difficulty dressing or bathing?: Yes Independently performs ADLs?: Yes (appropriate for developmental age) Does the patient have difficulty walking or climbing stairs?: No Weakness of Legs: Right Weakness of Arms/Hands: None  Permission Sought/Granted Permission sought to share information with : Case Manager Permission granted to share information with : Yes, Verbal Permission Granted     Permission granted to share info w AGENCY: [facility]        Emotional Assessment Appearance:: Appears stated age Attitude/Demeanor/Rapport: Engaged Affect (typically observed): Appropriate Orientation: : Oriented to Self, Oriented to Place, Oriented to  Time, Oriented to Situation   Psych Involvement: No (comment)  Admission diagnosis:  SOB (shortness of breath) [R06.02] Acute coronary syndrome (HCC) [I24.9] Atrial fibrillation with RVR (HCC) [I48.91] Patient Active Problem List  Diagnosis Date Noted   Acute coronary syndrome (HCC) 02/09/2023   Mitral regurgitation 04/04/2021   Aortic atherosclerosis (HCC) 04/04/2021   Mixed dyslipidemia 04/04/2021    Allergy 04/01/2021   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   S/P left total hip arthroplasty 09/17/2020   Pre-operative cardiovascular examination 09/03/2020   Arthritis    Colon polyps    GERD (gastroesophageal reflux disease)    Hypertension    Spinal stenosis    Short sleeper 04/29/2020   Snoring 04/29/2020   Insomnia 03/06/2020   Abnormal glucose 03/06/2020   Sacroiliitis (HCC) 08/02/2019   Sensorineural hearing loss (SNHL) of both ears 07/05/2019   Tinnitus aurium, bilateral 07/05/2019   Radial tunnel syndrome, right 11/04/2016   Lumbar radiculopathy 01/09/2016   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 11/26/2015   Age-related osteoporosis without current pathological fracture 11/22/2015   Osteopenia determined by x-ray 11/22/2015   Allergic rhinitis due to pollen 08/19/2015   GAD (generalized anxiety disorder) 08/19/2015   LVH (left ventricular 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 05/23/2015   Chronic pain syndrome 04/01/2015   Non-rheumatic mitral regurgitation 03/21/2015   History of Bell's palsy 03/21/2015   Other intervertebral disc degeneration, lumbar region 01/29/2015   Spinal stenosis, lumbar region without neurogenic claudication 11/22/2014   Radiculopathy, lumbar region 11/22/2014   Cervical disc disorder 05/10/2009   Female cystocele 02/27/2009   Hx of adenomatous colonic polyps 12/20/2008   Celiac disease 07/12/2006   Left bundle-branch block 05/20/2006   Chronic cough 02/15/2006   Screening for colon cancer 04/17/2005   Unspecified asthma, uncomplicated 12/25/2004   Incomplete uterovaginal prolapse 03/04/2004   Primary osteoarthritis, right hand 02/28/2002   Carpal tunnel syndrome, bilateral 02/28/2002   Trigger thumb, unspecified thumb 02/28/2002   Rhinitis, allergic 02/24/2002   Essential hypertension 07/20/2000   PCP:  Estevan Oaks, NP [facility]   [phone] [place], [place] - [address] AT [address] [facility] [address] [place] [address] Phone: [phone] Fax: [phone]  [id number] [place], [place] - [address] AT [address]OSS [address] [place] [address] Phone: [phone] Fax: [phone]  [id number] [place], [place] - [address] AT [facility] [address] [place] [address] Phone: [phone] Fax: [phone]  [id number] - [place], [place] - [address] [place] [address] Phone: [phone] Fax: [phone]     Social Determinants of Health (SDOH) Social History: SDOH Screenings   Food Insecurity: No Food Insecurity (02/10/2023)  Housing: Low Risk  (02/10/2023)  Transportation Needs: No Transportation Needs (02/10/2023)  Utilities: Not At Risk (02/10/2023)  Depression (PHQ2-9): Low Risk  (01/14/2022)  Financial Resource Strain: High Risk (01/14/2022)  Physical Activity: Inactive (01/14/2022)  Social Connections: Unknown (10/13/2021)   Received from [facility]  Stress: Stress Concern Present (01/14/2022)  Tobacco Use: Medium Risk (02/09/2023)   SDOH Interventions:     Readmission Risk Interventions     No data to display

## 2023-02-11 NOTE — TOC Benefit Eligibility Note (Signed)
Pharmacy Patient Advocate Encounter  Insurance verification completed.    The patient is insured through [employer]. Patient has Medicare and is not eligible for a copay card, but may be able to apply for patient assistance, if available.    Ran test claim for Xarelto and the current 30 day co-pay is $108.10.  Ran test claim for Eliquis and the current 30 day co-pay is $112.81.  This test claim was processed through [facility]- copay amounts may vary at other pharmacies due to pharmacy/plan contracts, or as the patient moves through the different stages of their insurance plan.

## 2023-02-12 ENCOUNTER — Encounter (HOSPITAL_COMMUNITY)
Admission: RE | Admit: 2023-02-12 | Discharge: 2023-02-12 | Disposition: A | Discharge status: Home / Self Care | Payer: MEDICARE | Source: Ambulatory Visit | Attending: Orthopedic Surgery | Admitting: Orthopedic Surgery

## 2023-02-12 ENCOUNTER — Encounter (HOSPITAL_COMMUNITY): Payer: Self-pay

## 2023-02-12 ENCOUNTER — Other Ambulatory Visit: Payer: Self-pay

## 2023-02-12 DIAGNOSIS — I249 Acute ischemic heart disease, unspecified: Secondary | ICD-10-CM | POA: Diagnosis not present

## 2023-02-12 HISTORY — DX: Cardiac murmur, unspecified: R01.1

## 2023-02-12 HISTORY — DX: Acute ischemic heart disease, unspecified: I24.9

## 2023-02-12 HISTORY — DX: Family history of other specified conditions: Z84.89

## 2023-02-12 HISTORY — DX: Dyspnea, unspecified: R06.00

## 2023-02-12 LAB — LIPOPROTEIN A (LPA): Lipoprotein (a): 54.7 nmol/L — ABNORMAL HIGH (ref <75.0)

## 2023-02-12 LAB — HEMOGLOBIN A1C
Hgb A1c MFr Bld: 6 % — ABNORMAL HIGH (ref 4.8–5.6)
Mean Plasma Glucose: 126 mg/dL

## 2023-02-12 NOTE — Progress Notes (Addendum)
Patient came in to do blood work 02/11/23 and did interview over the phone 02/12/23. Went over printed instructions and updated medications list with patient.  COVID Vaccine Completed: yes   Date of COVID positive in last 90 days: no   PCP - Estevan Oaks, NP Cardiologist - [name], MD   Chest x-ray - 02/09/23 Epic EKG - 02/10/23 Epic Stress Test - 02/10/23 Epic ECHO - 02/02/23 Epic Cardiac Cath - n/a Pacemaker/ICD device last checked: n/a Spinal Cord Stimulator: n/a   Bowel Prep - no   Sleep Study - yes, negative CPAP -    Fasting Blood Sugar - preDM, no checks or meds Checks Blood Sugar _____ times a day   Last dose of GLP1 agonist-  N/A GLP1 instructions:  N/A   Last dose of SGLT-2 inhibitors-  N/A SGLT-2 instructions: N/A     Blood Thinner Instructions:  Eliquis, hold 5 days Aspirin Instructions:  Last Dose: 02/13/23 1900   Activity level:   Can go up a flight of stairs and perform activities of daily living without stopping and without symptoms of chest pain. SOB with exertion                 Anesthesia review: NRMR, LVH, HTN, LBBB, dizziness and lightheaded, systolic murmur, SOB, lymphoma    Patient denies shortness of breath, fever, cough and chest pain at PAT appointment   Patient verbalized understanding of instructions that were given to them at the PAT appointment. Patient was also instructed that they will need to review over the PAT instructions again at home before surgery

## 2023-02-14 LAB — PROTEIN ELECTROPHORESIS, SERUM
A/G Ratio: 1.3 (ref 0.7–1.7)
Albumin ELP: 3.7 g/dL (ref 2.9–4.4)
Alpha-1-Globulin: 0.1 g/dL (ref 0.0–0.4)
Alpha-2-Globulin: 0.9 g/dL (ref 0.4–1.0)
Beta Globulin: 0.8 g/dL (ref 0.7–1.3)
Gamma Globulin: 1 g/dL (ref 0.4–1.8)
Globulin, Total: 2.9 g/dL (ref 2.2–3.9)
Total Protein ELP: 6.6 g/dL (ref 6.0–8.5)

## 2023-02-15 NOTE — Anesthesia Preprocedure Evaluation (Signed)
Anesthesia Evaluation  Patient identified by MRN, date of birth, ID band Patient awake    Reviewed: Allergy & Precautions, NPO status , Patient's Chart, lab work & pertinent test results  Airway Mallampati: I  TM Distance: >3 FB Neck ROM: Full    Dental no notable dental hx. (+) Teeth Intact   Pulmonary asthma , former smoker   Pulmonary exam normal breath sounds clear to auscultation       Cardiovascular hypertension, Pt. on medications and Pt. on home beta blockers Normal cardiovascular exam+ dysrhythmias (LBBB) Atrial Fibrillation  Rhythm:Regular Rate:Normal  Echo 02/02/2023  1. Left ventricular ejection fraction, by estimation, is 60 to 65%. The  left ventricle has normal function. The left ventricle has no regional  wall motion abnormalities. There is severe asymmetric left ventricular  hypertrophy of the septal segment. Left   ventricular diastolic parameters are consistent with Grade I diastolic  dysfunction (impaired relaxation).   2. Right ventricular systolic function is normal. The right ventricular  size is normal.   3. The mitral valve is normal in structure. Mild to moderate mitral valve  regurgitation. No evidence of mitral stenosis.   4. The aortic valve is tricuspid. There is mild calcification of the  aortic valve. There is mild thickening of the aortic valve. Aortic valve  regurgitation is not visualized. Aortic valve sclerosis/calcification is  present, without any evidence of  aortic stenosis.     Neuro/Psych  PSYCHIATRIC DISORDERS Anxiety      Neuromuscular disease    GI/Hepatic Neg liver ROS,GERD  ,,  Endo/Other  negative endocrine ROS    Renal/GU Lab Results      Component                Value               Date                                  K                        3.9                 02/11/2023                     CREATININE               0.70                02/11/2023                      Musculoskeletal  (+) Arthritis , Osteoarthritis,  Chronic back pain   Abdominal   Peds  Hematology Lab Results      Component                Value               Date                      WBC                      6.5                 02/11/2023                HGB  12.3                02/11/2023                HCT                      37.7                02/11/2023                MCV                      85.5                02/11/2023                PLT                      257                 02/11/2023           On Eliquis   Anesthesia Other Findings All: Codiene, gabapentin , Sulfa  Reproductive/Obstetrics                             Anesthesia Physical Anesthesia Plan  ASA: 2  Anesthesia Plan: Spinal   Post-op Pain Management: Minimal or no pain anticipated and Regional block*   Induction: Intravenous  PONV Risk Score and Plan: Treatment may vary due to age or medical condition and Ondansetron  Airway Management Planned: Natural Airway and Nasal Cannula  Additional Equipment: None  Intra-op Plan:   Post-operative Plan:   Informed Consent: I have reviewed the patients History and Physical, chart, labs and discussed the procedure including the risks, benefits and alternatives for the proposed anesthesia with the patient or authorized representative who has indicated his/her understanding and acceptance.     Dental advisory given  Plan Discussed with: CRNA  Anesthesia Plan Comments: (See PAT note 02/11/23  Check last eliquis 9/14)       Anesthesia Quick Evaluation

## 2023-02-15 NOTE — Progress Notes (Signed)
Anesthesia Chart Review   Case: [id number] Date/Time: 02/18/23 0945   Procedure: TOTAL HIP ARTHROPLASTY ANTERIOR APPROACH (Right: Hip)   Anesthesia type: Spinal   Pre-op diagnosis: Right hip osteoarthrits   Location: [facility]OM 09 / [facility]   Surgeons: Durene Romans, MD       DISCUSSION:76 y.o. former smoker with h/o HTN, chronic LBBB, atrial fibrillation, right hip OA scheduled for above procedure 02/18/2023 with Dr. Durene Romans.   Patient with recent admission 02/09/2023 to 02/11/2023.  She was found to be in A-fib with RVR after PFTs on 02/09/2023.  Converted to sinus rhythm with 20 mg of IV diltiazem.  Started on Eliquis.  Per Dr. Roseanne Kaufman notes patient will hold Eliquis prior to upcoming hip surgery, last dose 02/14/2023.  She is cleared for right hip surgery per Dr. Algie Coffer.  VS: There were no vitals taken for this visit.  PROVIDERS: Estevan Oaks, NP is PCP   [name], MD is Cardiologist  LABS: Labs reviewed: Acceptable for surgery. (all labs ordered are listed, but only abnormal results are displayed)  Labs Reviewed  SURGICAL PCR SCREEN  TYPE AND SCREEN     IMAGES:   EKG:   CV: Echo 02/02/2023  1. Left ventricular ejection fraction, by estimation, is 60 to 65%. The  left ventricle has normal function. The left ventricle has no regional  wall motion abnormalities. There is severe asymmetric left ventricular  hypertrophy of the septal segment. Left   ventricular diastolic parameters are consistent with Grade I diastolic  dysfunction (impaired relaxation).   2. Right ventricular systolic function is normal. The right ventricular  size is normal.   3. The mitral valve is normal in structure. Mild to moderate mitral valve  regurgitation. No evidence of mitral stenosis.   4. The aortic valve is tricuspid. There is mild calcification of the  aortic valve. There is mild thickening of the aortic valve. Aortic valve  regurgitation is not visualized. Aortic valve  sclerosis/calcification is  present, without any evidence of  aortic stenosis.   Myocardial Perfusion 09/13/2020 Nuclear stress EF: 58%. The left ventricular ejection fraction is normal (55-65%). There was no ST segment deviation noted during stress. The study is normal. This is a low risk study.   No ischemia or infarction on perfusion images. Normal wall motion. Past Medical History:  Diagnosis Date   Abnormal glucose 03/06/2020   ACS (acute coronary syndrome) (HCC)    Age-related osteoporosis without current pathological fracture 11/22/2015   Overview:  Femoral neck  Formatting of this note might be different from the original. 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 Added automatically from request for surgery [id number]   Chronic pain syndrome 04/01/2015   Colon polyps    Dyspnea    Essential hypertension 07/20/2000   Overview:  HBP   Family history of adverse reaction to anesthesia    mother delerium   Female cystocele 02/27/2009   GAD (generalized anxiety disorder) 08/19/2015   GERD (gastroesophageal reflux disease)    Heart murmur    History of Bell's palsy 03/21/2015   Hx of adenomatous colonic polyps 12/20/2008  Overview:  Colonoscopy 07/2003 - [facility] - polyps removed - recommended repeat in 3 years Colonoscopy 09/2011 - Dr. Darrol Jump - hyperplastic polyp(s) were removed - repeat in 5 years   Hypertension    Incomplete uterovaginal prolapse 03/04/2004   Insomnia    Left bundle-branch block 05/20/2006   Lumbar radiculopathy 01/09/2016   Formatting of this note might be different from the original. 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 Femoral neck   Other intervertebral disc degeneration, lumbar region 01/29/2015   Pre-operative cardiovascular examination 09/03/2020   Primary osteoarthritis, right hand 02/28/2002   Overview:  OA HANDS, ESP THUMBS   Radial tunnel syndrome, right 11/04/2016   Radiculopathy, lumbar region 11/22/2014   Overview:  Added automatically from request for surgery [id number]   Rhinitis, allergic 02/24/2002   Overview:  ALLERGY SYMPTOMS - states was seen by allergist in past and informed allergic to dust   S/P left total hip arthroplasty 09/17/2020   Sacroiliitis (HCC) 08/02/2019   Screening for colon cancer 04/17/2005   Formatting of this note might be different from the original. 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 TRIGGER THUMBS - started spring 2003. Injected 02/28/02   Unspecified asthma, uncomplicated 12/25/2004    Past Surgical History:  Procedure Laterality Date   ABDOMINAL HYSTERECTOMY  2010   APPENDECTOMY  1969   CHOLECYSTECTOMY  1969   SPINAL FUSION  01/2016   TONSILLECTOMY AND ADENOIDECTOMY  1956   TOTAL HIP ARTHROPLASTY Left 09/17/2020   Procedure: TOTAL HIP ARTHROPLASTY ANTERIOR APPROACH;  Surgeon: Durene Romans, MD;  Location: [facility];  Service: Orthopedics;  Laterality: Left;  70 mins    MEDICATIONS:  albuterol (VENTOLIN HFA) 108 (90 Base) MCG/ACT inhaler    apixaban (ELIQUIS) 5 MG TABS tablet   Ascorbic Acid (VITAMIN C) 1000 MG tablet   Calcium-Magnesium (CAL-MAG PO)   Carboxymethylcellul-Glycerin (CLEAR EYES FOR DRY EYES OP)   [employer] MAGNESIUM OXIDE 250 MG TABS   diltiazem (CARDIZEM SR) 120 MG 12 hr capsule   furosemide (LASIX) 20 MG tablet   hydrochlorothiazide (HYDRODIURIL) 25 MG tablet   loratadine (CLARITIN) 10 MG tablet   losartan (COZAAR) 50 MG tablet   metoprolol tartrate (LOPRESSOR) 25 MG tablet   milk thistle 175 MG tablet   nitroGLYCERIN (NITROSTAT) 0.4 MG SL tablet   Probiotic Product (PROBIOTIC DAILY PO)   rosuvastatin (CRESTOR) 20 MG tablet   Vitamin A 2400 MCG (8000 UT) CAPS   Vitamin D, Ergocalciferol, (DRISDOL) 1.25 MG (50000 UNIT) CAPS capsule   No current facility-administered medications for this encounter.     Jodell Cipro Ward, PA-C [facility] [phone]

## 2023-02-16 ENCOUNTER — Telehealth: Payer: Self-pay | Admitting: Pulmonary Disease

## 2023-02-16 DIAGNOSIS — J449 Chronic obstructive pulmonary disease, unspecified: Secondary | ICD-10-CM

## 2023-02-16 MED ORDER — INCRUSE ELLIPTA 62.5 MCG/ACT IN AEPB
1.0000 | INHALATION_SPRAY | Freq: Every day | RESPIRATORY_TRACT | 6 refills | Status: DC
Start: 2023-02-16 — End: 2024-01-11

## 2023-02-16 NOTE — Telephone Encounter (Signed)
Called and relayed PFTs Result. FEV-1 1.26 L 65% pred, FVC 1.8L 69% predicted, FEV-1/FVC 70% of predicted. TLC 101% and RV 123%. DLCO mildly reduced. 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   Prescribed Umeclidinium [Incruse Ellipta] 62.22mcg 1 Puff once a day.

## 2023-02-18 ENCOUNTER — Other Ambulatory Visit: Payer: Self-pay

## 2023-02-18 ENCOUNTER — Ambulatory Visit (HOSPITAL_COMMUNITY): Payer: MEDICARE | Admitting: Physician Assistant

## 2023-02-18 ENCOUNTER — Encounter (HOSPITAL_COMMUNITY)
Admission: RE | Disposition: A | Discharge status: Home / Self Care | Payer: Self-pay | Source: Home / Self Care | Attending: Orthopedic Surgery

## 2023-02-18 ENCOUNTER — Observation Stay (HOSPITAL_COMMUNITY): Payer: MEDICARE

## 2023-02-18 ENCOUNTER — Ambulatory Visit (HOSPITAL_COMMUNITY): Payer: MEDICARE | Admitting: Anesthesiology

## 2023-02-18 ENCOUNTER — Inpatient Hospital Stay (HOSPITAL_COMMUNITY)
Admission: RE | Admit: 2023-02-18 | Discharge: 2023-02-21 | DRG: 470 | Disposition: A | Discharge status: Home / Self Care | Payer: MEDICARE | Attending: Orthopedic Surgery | Admitting: Orthopedic Surgery

## 2023-02-18 ENCOUNTER — Ambulatory Visit (HOSPITAL_COMMUNITY): Payer: MEDICARE

## 2023-02-18 ENCOUNTER — Encounter (HOSPITAL_COMMUNITY): Payer: Self-pay | Admitting: Orthopedic Surgery

## 2023-02-18 DIAGNOSIS — Z885 Allergy status to narcotic agent status: Secondary | ICD-10-CM

## 2023-02-18 DIAGNOSIS — Z981 Arthrodesis status: Secondary | ICD-10-CM

## 2023-02-18 DIAGNOSIS — Z87891 Personal history of nicotine dependence: Secondary | ICD-10-CM

## 2023-02-18 DIAGNOSIS — M19041 Primary osteoarthritis, right hand: Secondary | ICD-10-CM | POA: Diagnosis present

## 2023-02-18 DIAGNOSIS — G894 Chronic pain syndrome: Secondary | ICD-10-CM | POA: Diagnosis present

## 2023-02-18 DIAGNOSIS — Z8261 Family history of arthritis: Secondary | ICD-10-CM

## 2023-02-18 DIAGNOSIS — Z9049 Acquired absence of other specified parts of digestive tract: Secondary | ICD-10-CM

## 2023-02-18 DIAGNOSIS — M1611 Unilateral primary osteoarthritis, right hip: Secondary | ICD-10-CM | POA: Diagnosis not present

## 2023-02-18 DIAGNOSIS — K900 Celiac disease: Secondary | ICD-10-CM | POA: Diagnosis present

## 2023-02-18 DIAGNOSIS — Z96642 Presence of left artificial hip joint: Secondary | ICD-10-CM | POA: Diagnosis present

## 2023-02-18 DIAGNOSIS — J45909 Unspecified asthma, uncomplicated: Secondary | ICD-10-CM | POA: Diagnosis present

## 2023-02-18 DIAGNOSIS — I119 Hypertensive heart disease without heart failure: Secondary | ICD-10-CM | POA: Diagnosis present

## 2023-02-18 DIAGNOSIS — Z806 Family history of leukemia: Secondary | ICD-10-CM

## 2023-02-18 DIAGNOSIS — Z96641 Presence of right artificial hip joint: Secondary | ICD-10-CM

## 2023-02-18 DIAGNOSIS — I340 Nonrheumatic mitral (valve) insufficiency: Secondary | ICD-10-CM | POA: Diagnosis present

## 2023-02-18 DIAGNOSIS — Z86010 Personal history of colonic polyps: Secondary | ICD-10-CM

## 2023-02-18 DIAGNOSIS — Z888 Allergy status to other drugs, medicaments and biological substances status: Secondary | ICD-10-CM

## 2023-02-18 DIAGNOSIS — K219 Gastro-esophageal reflux disease without esophagitis: Secondary | ICD-10-CM | POA: Diagnosis present

## 2023-02-18 DIAGNOSIS — Z9109 Other allergy status, other than to drugs and biological substances: Secondary | ICD-10-CM

## 2023-02-18 DIAGNOSIS — I4891 Unspecified atrial fibrillation: Secondary | ICD-10-CM | POA: Diagnosis present

## 2023-02-18 DIAGNOSIS — Z79899 Other long term (current) drug therapy: Secondary | ICD-10-CM

## 2023-02-18 DIAGNOSIS — F411 Generalized anxiety disorder: Secondary | ICD-10-CM | POA: Diagnosis present

## 2023-02-18 DIAGNOSIS — Z9181 History of falling: Secondary | ICD-10-CM

## 2023-02-18 DIAGNOSIS — E782 Mixed hyperlipidemia: Secondary | ICD-10-CM | POA: Diagnosis present

## 2023-02-18 DIAGNOSIS — M19042 Primary osteoarthritis, left hand: Secondary | ICD-10-CM | POA: Diagnosis present

## 2023-02-18 DIAGNOSIS — Z882 Allergy status to sulfonamides status: Secondary | ICD-10-CM

## 2023-02-18 DIAGNOSIS — Z8572 Personal history of non-Hodgkin lymphomas: Secondary | ICD-10-CM

## 2023-02-18 DIAGNOSIS — Z833 Family history of diabetes mellitus: Secondary | ICD-10-CM

## 2023-02-18 DIAGNOSIS — Z90710 Acquired absence of both cervix and uterus: Secondary | ICD-10-CM

## 2023-02-18 DIAGNOSIS — M810 Age-related osteoporosis without current pathological fracture: Secondary | ICD-10-CM | POA: Diagnosis present

## 2023-02-18 DIAGNOSIS — Z800 Family history of malignant neoplasm of digestive organs: Secondary | ICD-10-CM

## 2023-02-18 HISTORY — PX: TOTAL HIP ARTHROPLASTY: SHX124

## 2023-02-18 HISTORY — DX: Presence of right artificial hip joint: Z96.641

## 2023-02-18 LAB — TYPE AND SCREEN
ABO/RH(D): A POS
Antibody Screen: NEGATIVE

## 2023-02-18 SURGERY — ARTHROPLASTY, HIP, TOTAL, ANTERIOR APPROACH
Anesthesia: Spinal | Site: Hip | Laterality: Right

## 2023-02-18 MED ORDER — DIPHENHYDRAMINE HCL 12.5 MG/5ML PO ELIX
12.5000 mg | ORAL_SOLUTION | ORAL | Status: DC | PRN
  Administered 2023-02-21: 25 mg via ORAL
  Filled 2023-02-18 (×2): qty 10

## 2023-02-18 MED ORDER — UMECLIDINIUM BROMIDE 62.5 MCG/ACT IN AEPB
1.0000 | INHALATION_SPRAY | Freq: Every day | RESPIRATORY_TRACT | Status: DC
  Administered 2023-02-18 – 2023-02-21 (×4): 1 via RESPIRATORY_TRACT
  Filled 2023-02-18: qty 7

## 2023-02-18 MED ORDER — 0.9 % SODIUM CHLORIDE (POUR BTL) OPTIME
TOPICAL | Status: DC | PRN
  Administered 2023-02-18: 1000 mL

## 2023-02-18 MED ORDER — CEFAZOLIN SODIUM-DEXTROSE 2-4 GM/100ML-% IV SOLN
2.0000 g | Freq: Four times a day (QID) | INTRAVENOUS | Status: AC
  Administered 2023-02-18 (×2): 2 g via INTRAVENOUS
  Filled 2023-02-18 (×2): qty 100

## 2023-02-18 MED ORDER — DEXAMETHASONE SODIUM PHOSPHATE 10 MG/ML IJ SOLN
INTRAMUSCULAR | Status: AC
  Filled 2023-02-18: qty 1

## 2023-02-18 MED ORDER — PROPOFOL 1000 MG/100ML IV EMUL
INTRAVENOUS | Status: AC
  Filled 2023-02-18: qty 100

## 2023-02-18 MED ORDER — STERILE WATER FOR IRRIGATION IR SOLN
Status: DC | PRN
  Administered 2023-02-18: 2000 mL

## 2023-02-18 MED ORDER — MENTHOL 3 MG MT LOZG
1.0000 | LOZENGE | OROMUCOSAL | Status: DC | PRN
  Administered 2023-02-19: 3 mg via ORAL
  Filled 2023-02-18: qty 9

## 2023-02-18 MED ORDER — BUPIVACAINE IN DEXTROSE 0.75-8.25 % IT SOLN
INTRATHECAL | Status: DC | PRN
  Administered 2023-02-18: 1.7 mL via INTRATHECAL

## 2023-02-18 MED ORDER — TRANEXAMIC ACID-NACL 1000-0.7 MG/100ML-% IV SOLN
1000.0000 mg | INTRAVENOUS | Status: AC
  Administered 2023-02-18: 1000 mg via INTRAVENOUS
  Filled 2023-02-18: qty 100

## 2023-02-18 MED ORDER — METOPROLOL TARTRATE 25 MG PO TABS
25.0000 mg | ORAL_TABLET | Freq: Two times a day (BID) | ORAL | Status: DC
  Administered 2023-02-18 – 2023-02-21 (×6): 25 mg via ORAL
  Filled 2023-02-18 (×6): qty 1

## 2023-02-18 MED ORDER — ROSUVASTATIN CALCIUM 20 MG PO TABS
20.0000 mg | ORAL_TABLET | Freq: Every day | ORAL | Status: DC
  Administered 2023-02-19 – 2023-02-21 (×3): 20 mg via ORAL
  Filled 2023-02-18 (×4): qty 1

## 2023-02-18 MED ORDER — HYDROMORPHONE HCL 1 MG/ML IJ SOLN: 0.5000 mg | INTRAMUSCULAR | Status: DC | PRN

## 2023-02-18 MED ORDER — KETOROLAC TROMETHAMINE 30 MG/ML IJ SOLN
INTRAMUSCULAR | Status: DC | PRN
  Administered 2023-02-18: 30 mg

## 2023-02-18 MED ORDER — FUROSEMIDE 20 MG PO TABS: 20.0000 mg | ORAL_TABLET | ORAL | Status: DC | PRN

## 2023-02-18 MED ORDER — MIDAZOLAM HCL 2 MG/2ML IJ SOLN
INTRAMUSCULAR | Status: AC
  Filled 2023-02-18: qty 2

## 2023-02-18 MED ORDER — METHOCARBAMOL 500 MG IVPB - SIMPLE MED: 500.0000 mg | Freq: Four times a day (QID) | INTRAVENOUS | Status: DC | PRN

## 2023-02-18 MED ORDER — PHENYLEPHRINE HCL (PRESSORS) 10 MG/ML IV SOLN
INTRAVENOUS | Status: AC
  Filled 2023-02-18: qty 1

## 2023-02-18 MED ORDER — CEFAZOLIN SODIUM-DEXTROSE 2-4 GM/100ML-% IV SOLN
2.0000 g | INTRAVENOUS | Status: AC
  Administered 2023-02-18: 2 g via INTRAVENOUS
  Filled 2023-02-18: qty 100

## 2023-02-18 MED ORDER — PHENYLEPHRINE 80 MCG/ML (10ML) SYRINGE FOR IV PUSH (FOR BLOOD PRESSURE SUPPORT)
PREFILLED_SYRINGE | INTRAVENOUS | Status: AC
  Filled 2023-02-18: qty 10

## 2023-02-18 MED ORDER — LACTATED RINGERS IV SOLN: INTRAVENOUS | Status: DC

## 2023-02-18 MED ORDER — OXYCODONE HCL 5 MG/5ML PO SOLN: 5.0000 mg | Freq: Once | ORAL | Status: DC | PRN

## 2023-02-18 MED ORDER — AMISULPRIDE (ANTIEMETIC) 5 MG/2ML IV SOLN: 10.0000 mg | Freq: Once | INTRAVENOUS | Status: DC | PRN

## 2023-02-18 MED ORDER — OXYCODONE HCL 5 MG PO TABS: 5.0000 mg | ORAL_TABLET | Freq: Once | ORAL | Status: DC | PRN

## 2023-02-18 MED ORDER — PROPOFOL 500 MG/50ML IV EMUL
INTRAVENOUS | Status: DC | PRN
  Administered 2023-02-18: 75 ug/kg/min via INTRAVENOUS

## 2023-02-18 MED ORDER — METOCLOPRAMIDE HCL 5 MG/ML IJ SOLN: 5.0000 mg | Freq: Three times a day (TID) | INTRAMUSCULAR | Status: DC | PRN

## 2023-02-18 MED ORDER — ALBUMIN HUMAN 5 % IV SOLN
INTRAVENOUS | Status: AC
  Administered 2023-02-18: 12.5 g via INTRAVENOUS
  Filled 2023-02-18: qty 250

## 2023-02-18 MED ORDER — HYDROCODONE-ACETAMINOPHEN 7.5-325 MG PO TABS
1.0000 | ORAL_TABLET | ORAL | Status: DC | PRN
  Administered 2023-02-20: 1 via ORAL
  Administered 2023-02-21: 2 via ORAL
  Filled 2023-02-18: qty 1
  Filled 2023-02-18: qty 2

## 2023-02-18 MED ORDER — HYDROMORPHONE HCL 1 MG/ML IJ SOLN: 0.2500 mg | INTRAMUSCULAR | Status: DC | PRN

## 2023-02-18 MED ORDER — BISACODYL 10 MG RE SUPP: 10.0000 mg | Freq: Every day | RECTAL | Status: DC | PRN

## 2023-02-18 MED ORDER — DEXAMETHASONE SODIUM PHOSPHATE 10 MG/ML IJ SOLN: 8.0000 mg | Freq: Once | INTRAMUSCULAR | Status: DC

## 2023-02-18 MED ORDER — METHOCARBAMOL 500 MG PO TABS
500.0000 mg | ORAL_TABLET | Freq: Four times a day (QID) | ORAL | Status: DC | PRN
  Administered 2023-02-18 – 2023-02-20 (×4): 500 mg via ORAL
  Filled 2023-02-18 (×4): qty 1

## 2023-02-18 MED ORDER — ONDANSETRON HCL 4 MG PO TABS: 4.0000 mg | ORAL_TABLET | Freq: Four times a day (QID) | ORAL | Status: DC | PRN

## 2023-02-18 MED ORDER — LIDOCAINE HCL (PF) 2 % IJ SOLN
INTRAMUSCULAR | Status: AC
  Filled 2023-02-18: qty 5

## 2023-02-18 MED ORDER — ORAL CARE MOUTH RINSE: 15.0000 mL | Freq: Once | OROMUCOSAL | Status: AC

## 2023-02-18 MED ORDER — GLYCOPYRROLATE 0.2 MG/ML IJ SOLN
INTRAMUSCULAR | Status: AC
  Filled 2023-02-18: qty 1

## 2023-02-18 MED ORDER — DILTIAZEM HCL ER 60 MG PO CP12
120.0000 mg | ORAL_CAPSULE | Freq: Two times a day (BID) | ORAL | Status: DC
  Administered 2023-02-18 – 2023-02-21 (×6): 120 mg via ORAL
  Filled 2023-02-18 (×7): qty 2

## 2023-02-18 MED ORDER — METOCLOPRAMIDE HCL 5 MG PO TABS: 5.0000 mg | ORAL_TABLET | Freq: Three times a day (TID) | ORAL | Status: DC | PRN

## 2023-02-18 MED ORDER — NITROGLYCERIN 0.4 MG SL SUBL: 0.4000 mg | SUBLINGUAL_TABLET | SUBLINGUAL | Status: DC | PRN

## 2023-02-18 MED ORDER — TRANEXAMIC ACID-NACL 1000-0.7 MG/100ML-% IV SOLN
1000.0000 mg | Freq: Once | INTRAVENOUS | Status: AC
  Administered 2023-02-18: 1000 mg via INTRAVENOUS
  Filled 2023-02-18: qty 100

## 2023-02-18 MED ORDER — DEXAMETHASONE SODIUM PHOSPHATE 10 MG/ML IJ SOLN
10.0000 mg | Freq: Once | INTRAMUSCULAR | Status: AC
  Administered 2023-02-19: 10 mg via INTRAVENOUS
  Filled 2023-02-18: qty 1

## 2023-02-18 MED ORDER — SODIUM CHLORIDE (PF) 0.9 % IJ SOLN
INTRAMUSCULAR | Status: DC | PRN
  Administered 2023-02-18: 30 mL

## 2023-02-18 MED ORDER — ONDANSETRON HCL 4 MG/2ML IJ SOLN
INTRAMUSCULAR | Status: AC
  Filled 2023-02-18: qty 2

## 2023-02-18 MED ORDER — ALUM & MAG HYDROXIDE-SIMETH 200-200-20 MG/5ML PO SUSP: 30.0000 mL | ORAL | Status: DC | PRN

## 2023-02-18 MED ORDER — POLYETHYLENE GLYCOL 3350 17 G PO PACK
17.0000 g | PACK | Freq: Two times a day (BID) | ORAL | Status: DC
  Administered 2023-02-18 – 2023-02-21 (×6): 17 g via ORAL
  Filled 2023-02-18 (×7): qty 1

## 2023-02-18 MED ORDER — ONDANSETRON HCL 4 MG/2ML IJ SOLN: 4.0000 mg | Freq: Four times a day (QID) | INTRAMUSCULAR | Status: DC | PRN

## 2023-02-18 MED ORDER — BUPIVACAINE HCL (PF) 0.5 % IJ SOLN
INTRAMUSCULAR | Status: AC
  Filled 2023-02-18: qty 30

## 2023-02-18 MED ORDER — ALBUMIN HUMAN 5 % IV SOLN: 12.5000 g | Freq: Once | INTRAVENOUS | Status: AC

## 2023-02-18 MED ORDER — KETOROLAC TROMETHAMINE 15 MG/ML IJ SOLN
7.5000 mg | Freq: Four times a day (QID) | INTRAMUSCULAR | Status: AC
  Administered 2023-02-18 (×2): 7.5 mg via INTRAVENOUS
  Filled 2023-02-18 (×2): qty 1

## 2023-02-18 MED ORDER — LOSARTAN POTASSIUM 50 MG PO TABS
50.0000 mg | ORAL_TABLET | Freq: Every day | ORAL | Status: DC
  Administered 2023-02-19 – 2023-02-21 (×3): 50 mg via ORAL
  Filled 2023-02-18 (×3): qty 1

## 2023-02-18 MED ORDER — PHENYLEPHRINE HCL-NACL 20-0.9 MG/250ML-% IV SOLN
INTRAVENOUS | Status: DC | PRN
Start: 2023-02-18 — End: 2023-02-18
  Administered 2023-02-18: 50 ug/min via INTRAVENOUS

## 2023-02-18 MED ORDER — PHENOL 1.4 % MT LIQD: 1.0000 | OROMUCOSAL | Status: DC | PRN

## 2023-02-18 MED ORDER — FENTANYL CITRATE (PF) 100 MCG/2ML IJ SOLN
INTRAMUSCULAR | Status: AC
  Filled 2023-02-18: qty 2

## 2023-02-18 MED ORDER — PROPOFOL 10 MG/ML IV BOLUS
INTRAVENOUS | Status: DC | PRN
Start: 2023-02-18 — End: 2023-02-18
  Administered 2023-02-18: 30 mg via INTRAVENOUS

## 2023-02-18 MED ORDER — SODIUM CHLORIDE 0.9 % IV SOLN: INTRAVENOUS | Status: DC

## 2023-02-18 MED ORDER — ACETAMINOPHEN 10 MG/ML IV SOLN: 1000.0000 mg | Freq: Once | INTRAVENOUS | Status: DC | PRN

## 2023-02-18 MED ORDER — BUPIVACAINE-EPINEPHRINE (PF) 0.25% -1:200000 IJ SOLN
INTRAMUSCULAR | Status: DC | PRN
  Administered 2023-02-18: 30 mL via PERINEURAL

## 2023-02-18 MED ORDER — HYDROCODONE-ACETAMINOPHEN 5-325 MG PO TABS
1.0000 | ORAL_TABLET | ORAL | Status: DC | PRN
  Administered 2023-02-18: 2 via ORAL
  Administered 2023-02-18: 1 via ORAL
  Administered 2023-02-19 (×4): 2 via ORAL
  Administered 2023-02-20: 1 via ORAL
  Administered 2023-02-20 – 2023-02-21 (×4): 2 via ORAL
  Filled 2023-02-18: qty 1
  Filled 2023-02-18: qty 2
  Filled 2023-02-18: qty 1
  Filled 2023-02-18 (×5): qty 2
  Filled 2023-02-18: qty 1
  Filled 2023-02-18 (×3): qty 2

## 2023-02-18 MED ORDER — FENTANYL CITRATE (PF) 100 MCG/2ML IJ SOLN
INTRAMUSCULAR | Status: DC | PRN
  Administered 2023-02-18 (×2): 50 ug via INTRAVENOUS

## 2023-02-18 MED ORDER — ONDANSETRON HCL 4 MG/2ML IJ SOLN: 4.0000 mg | Freq: Once | INTRAMUSCULAR | Status: DC | PRN

## 2023-02-18 MED ORDER — APIXABAN 5 MG PO TABS
5.0000 mg | ORAL_TABLET | Freq: Two times a day (BID) | ORAL | Status: DC
  Administered 2023-02-19 – 2023-02-21 (×5): 5 mg via ORAL
  Filled 2023-02-18 (×5): qty 1

## 2023-02-18 MED ORDER — SENNA 8.6 MG PO TABS
2.0000 | ORAL_TABLET | Freq: Every day | ORAL | Status: DC
  Administered 2023-02-19 – 2023-02-20 (×2): 17.2 mg via ORAL
  Filled 2023-02-18 (×3): qty 2

## 2023-02-18 MED ORDER — SODIUM CHLORIDE (PF) 0.9 % IJ SOLN
INTRAMUSCULAR | Status: AC
  Filled 2023-02-18: qty 50

## 2023-02-18 MED ORDER — HYDROCHLOROTHIAZIDE 25 MG PO TABS
25.0000 mg | ORAL_TABLET | Freq: Every day | ORAL | Status: DC
  Administered 2023-02-19 – 2023-02-21 (×3): 25 mg via ORAL
  Filled 2023-02-18 (×3): qty 1

## 2023-02-18 MED ORDER — KETOROLAC TROMETHAMINE 30 MG/ML IJ SOLN
INTRAMUSCULAR | Status: AC
  Filled 2023-02-18: qty 1

## 2023-02-18 MED ORDER — LORATADINE 10 MG PO TABS
10.0000 mg | ORAL_TABLET | Freq: Every day | ORAL | Status: DC | PRN
  Administered 2023-02-19: 10 mg via ORAL
  Filled 2023-02-18: qty 1

## 2023-02-18 MED ORDER — CHLORHEXIDINE GLUCONATE 0.12 % MT SOLN
15.0000 mL | Freq: Once | OROMUCOSAL | Status: AC
  Administered 2023-02-18: 15 mL via OROMUCOSAL

## 2023-02-18 MED ORDER — POVIDONE-IODINE 10 % EX SWAB: 2.0000 | Freq: Once | CUTANEOUS | Status: DC

## 2023-02-18 MED ORDER — ALBUTEROL SULFATE (2.5 MG/3ML) 0.083% IN NEBU: 3.0000 mL | INHALATION_SOLUTION | Freq: Four times a day (QID) | RESPIRATORY_TRACT | Status: DC | PRN

## 2023-02-18 SURGICAL SUPPLY — 45 items
ADH SKN CLS APL DERMABOND .7 (GAUZE/BANDAGES/DRESSINGS) ×1
BAG COUNTER SPONGE SURGICOUNT (BAG) IMPLANT
BAG SPEC THK2 15X12 ZIP CLS (MISCELLANEOUS)
BAG SPNG CNTER NS LX DISP (BAG)
BAG ZIPLOCK 12X15 (MISCELLANEOUS) IMPLANT
BALL HIP CERAMIC (Hips) IMPLANT
BLADE SAG 18X100X1.27 (BLADE) ×1 IMPLANT
COVER PERINEAL POST (MISCELLANEOUS) ×1 IMPLANT
COVER SURGICAL LIGHT HANDLE (MISCELLANEOUS) ×1 IMPLANT
CUP ACET PINNACLE SECTR 50MM (Hips) IMPLANT
DERMABOND ADVANCED .7 DNX12 (GAUZE/BANDAGES/DRESSINGS) ×1 IMPLANT
DRAPE FOOT SWITCH (DRAPES) ×1 IMPLANT
DRAPE STERI IOBAN 125X83 (DRAPES) ×1 IMPLANT
DRAPE U-SHAPE 47X51 STRL (DRAPES) ×2 IMPLANT
DRESSING AQUACEL AG SP 3.5X10 (GAUZE/BANDAGES/DRESSINGS) ×1 IMPLANT
DRSG AQUACEL AG ADV 3.5X10 (GAUZE/BANDAGES/DRESSINGS) IMPLANT
DRSG AQUACEL AG SP 3.5X10 (GAUZE/BANDAGES/DRESSINGS) ×1
DURAPREP 26ML APPLICATOR (WOUND CARE) ×1 IMPLANT
ELECT REM PT RETURN 15FT ADLT (MISCELLANEOUS) ×1 IMPLANT
GLOVE BIO SURGEON STRL SZ 6 (GLOVE) ×1 IMPLANT
GLOVE BIOGEL PI IND STRL 6.5 (GLOVE) ×1 IMPLANT
GLOVE BIOGEL PI IND STRL 7.5 (GLOVE) ×1 IMPLANT
GLOVE ORTHO TXT STRL SZ7.5 (GLOVE) ×2 IMPLANT
GOWN STRL REUS W/ TWL LRG LVL3 (GOWN DISPOSABLE) ×2 IMPLANT
GOWN STRL REUS W/TWL LRG LVL3 (GOWN DISPOSABLE) ×2
HIP BALL CERAMIC (Hips) ×1 IMPLANT
HOLDER FOLEY CATH W/STRAP (MISCELLANEOUS) ×1 IMPLANT
KIT TURNOVER KIT A (KITS) IMPLANT
LINER ACET PNNCL PLUS4 NEUTRAL (Hips) IMPLANT
NDL SAFETY ECLIP 18X1.5 (MISCELLANEOUS) IMPLANT
PACK ANTERIOR HIP CUSTOM (KITS) ×1 IMPLANT
PINNACLE PLUS 4 NEUTRAL (Hips) ×1 IMPLANT
PINNACLE SECTOR CUP 50MM (Hips) ×1 IMPLANT
SCREW 6.5MMX30MM (Screw) IMPLANT
STEM FEM ACTIS HIGH SZ3 (Stem) IMPLANT
SUT MNCRL AB 4-0 PS2 18 (SUTURE) ×1 IMPLANT
SUT STRATAFIX 0 PDS 27 VIOLET (SUTURE) ×1
SUT VIC AB 1 CT1 36 (SUTURE) ×3 IMPLANT
SUT VIC AB 2-0 CT1 27 (SUTURE) ×2
SUT VIC AB 2-0 CT1 TAPERPNT 27 (SUTURE) ×2 IMPLANT
SUTURE STRATFX 0 PDS 27 VIOLET (SUTURE) ×1 IMPLANT
SYR 3ML LL SCALE MARK (SYRINGE) IMPLANT
TRAY FOLEY MTR SLVR 16FR STAT (SET/KITS/TRAYS/PACK) IMPLANT
TUBE SUCTION HIGH CAP CLEAR NV (SUCTIONS) ×1 IMPLANT
WATER STERILE IRR 1000ML POUR (IV SOLUTION) ×1 IMPLANT

## 2023-02-18 NOTE — Discharge Instructions (Addendum)
INSTRUCTIONS AFTER JOINT REPLACEMENT   Remove items at home which could result in a fall. This includes throw rugs or furniture in walking pathways ICE to the affected joint every three hours while awake for 30 minutes at a time, for at least the first 3-5 days, and then as needed for pain and swelling.  Continue to use ice for pain and swelling. You may notice swelling that will progress down to the foot and ankle.  This is normal after surgery.  Elevate your leg when you are not up walking on it.   Continue to use the breathing machine you got in the hospital (incentive spirometer) which will help keep your temperature down.  It is common for your temperature to cycle up and down following surgery, especially at night when you are not up moving around and exerting yourself.  The breathing machine keeps your lungs expanded and your temperature down.   DIET:  As you were doing prior to hospitalization, we recommend a well-balanced diet.  DRESSING / WOUND CARE / SHOWERING  Keep the surgical dressing until follow up.  The dressing is water proof, so you can shower without any extra covering.  IF THE DRESSING FALLS OFF or the wound gets wet inside, change the dressing with sterile gauze.  Please use good hand washing techniques before changing the dressing.  Do not use any lotions or creams on the incision until instructed by your surgeon.    ACTIVITY  Increase activity slowly as tolerated, but follow the weight bearing instructions below.   No driving for 6 weeks or until further direction given by your physician.  You cannot drive while taking narcotics.  No lifting or carrying greater than 10 lbs. until further directed by your surgeon. Avoid periods of inactivity such as sitting longer than an hour when not asleep. This helps prevent blood clots.  You may return to work once you are authorized by your doctor.     WEIGHT BEARING   Weight bearing as tolerated with assist device (walker, cane,  etc) as directed, use it as long as suggested by your surgeon or therapist, typically at least 4-6 weeks.   EXERCISES  Results after joint replacement surgery are often greatly improved when you follow the exercise, range of motion and muscle strengthening exercises prescribed by your doctor. Safety measures are also important to protect the joint from further injury. Any time any of these exercises cause you to have increased pain or swelling, decrease what you are doing until you are comfortable again and then slowly increase them. If you have problems or questions, call your caregiver or physical therapist for advice.   Rehabilitation is important following a joint replacement. After just a few days of immobilization, the muscles of the leg can become weakened and shrink (atrophy).  These exercises are designed to build up the tone and strength of the thigh and leg muscles and to improve motion. Often times heat used for twenty to thirty minutes before working out will loosen up your tissues and help with improving the range of motion but do not use heat for the first two weeks following surgery (sometimes heat can increase post-operative swelling).   These exercises can be done on a training (exercise) mat, on the floor, on a table or on a bed. Use whatever works the best and is most comfortable for you.    Use music or television while you are exercising so that the exercises are a pleasant break in your  day. This will make your life better with the exercises acting as a break in your routine that you can look forward to.   Perform all exercises about fifteen times, three times per day or as directed.  You should exercise both the operative leg and the other leg as well.  Exercises include:   Quad Sets - Tighten up the muscle on the front of the thigh (Quad) and hold for 5-10 seconds.   Straight Leg Raises - With your knee straight (if you were given a brace, keep it on), lift the leg to 60  degrees, hold for 3 seconds, and slowly lower the leg.  Perform this exercise against resistance later as your leg gets stronger.  Leg Slides: Lying on your back, slowly slide your foot toward your buttocks, bending your knee up off the floor (only go as far as is comfortable). Then slowly slide your foot back down until your leg is flat on the floor again.  Angel Wings: Lying on your back spread your legs to the side as far apart as you can without causing discomfort.  Hamstring Strength:  Lying on your back, push your heel against the floor with your leg straight by tightening up the muscles of your buttocks.  Repeat, but this time bend your knee to a comfortable angle, and push your heel against the floor.  You may put a pillow under the heel to make it more comfortable if necessary.   A rehabilitation program following joint replacement surgery can speed recovery and prevent re-injury in the future due to weakened muscles. Contact your doctor or a physical therapist for more information on knee rehabilitation.    CONSTIPATION  Constipation is defined medically as fewer than three stools per week and severe constipation as less than one stool per week.  Even if you have a regular bowel pattern at home, your normal regimen is likely to be disrupted due to multiple reasons following surgery.  Combination of anesthesia, postoperative narcotics, change in appetite and fluid intake all can affect your bowels.   YOU MUST use at least one of the following options; they are listed in order of increasing strength to get the job done.  They are all available over the counter, and you may need to use some, POSSIBLY even all of these options:    Drink plenty of fluids (prune juice may be helpful) and high fiber foods Colace 100 mg by mouth twice a day  Senokot for constipation as directed and as needed Dulcolax (bisacodyl), take with full glass of water  Miralax (polyethylene glycol) once or twice a day as  needed.  If you have tried all these things and are unable to have a bowel movement in the first 3-4 days after surgery call either your surgeon or your primary doctor.    If you experience loose stools or diarrhea, hold the medications until you stool forms back up.  If your symptoms do not get better within 1 week or if they get worse, check with your doctor.  If you experience "the worst abdominal pain ever" or develop nausea or vomiting, please contact the office immediately for further recommendations for treatment.   ITCHING:  If you experience itching with your medications, try taking only a single pain pill, or even half a pain pill at a time.  You can also use Benadryl over the counter for itching or also to help with sleep.   TED HOSE STOCKINGS:  Use stockings on both  legs until for at least 2 weeks or as directed by physician office. They may be removed at night for sleeping.  MEDICATIONS:  See your medication summary on the After Visit Summary that nursing will review with you.  You may have some home medications which will be placed on hold until you complete the course of blood thinner medication.  It is important for you to complete the blood thinner medication as prescribed.  PRECAUTIONS:  If you experience chest pain or shortness of breath - call 911 immediately for transfer to the hospital emergency department.   If you develop a fever greater that 101 F, purulent drainage from wound, increased redness or drainage from wound, foul odor from the wound/dressing, or calf pain - CONTACT YOUR SURGEON.                                                   FOLLOW-UP APPOINTMENTS:  If you do not already have a post-op appointment, please call the office for an appointment to be seen by your surgeon.  Guidelines for how soon to be seen are listed in your After Visit Summary, but are typically between 1-4 weeks after surgery.  OTHER INSTRUCTIONS:   Knee Replacement:  Do not place pillow  under knee, focus on keeping the knee straight while resting. CPM instructions: 0-90 degrees, 2 hours in the morning, 2 hours in the afternoon, and 2 hours in the evening. Place foam block, curve side up under heel at all times except when in CPM or when walking.  DO NOT modify, tear, cut, or change the foam block in any way.  POST-OPERATIVE OPIOID TAPER INSTRUCTIONS: It is important to wean off of your opioid medication as soon as possible. If you do not need pain medication after your surgery it is ok to stop day one. Opioids include: Codeine, Hydrocodone(Norco, Vicodin), Oxycodone(Percocet, oxycontin) and hydromorphone amongst others.  Long term and even short term use of opiods can cause: Increased pain response Dependence Constipation Depression Respiratory depression And more.  Withdrawal symptoms can include Flu like symptoms Nausea, vomiting And more Techniques to manage these symptoms Hydrate well Eat regular healthy meals Stay active Use relaxation techniques(deep breathing, meditating, yoga) Do Not substitute Alcohol to help with tapering If you have been on opioids for less than two weeks and do not have pain than it is ok to stop all together.  Plan to wean off of opioids This plan should start within one week post op of your joint replacement. Maintain the same interval or time between taking each dose and first decrease the dose.  Cut the total daily intake of opioids by one tablet each day Next start to increase the time between doses. The last dose that should be eliminated is the evening dose.   MAKE SURE YOU:  Understand these instructions.  Get help right away if you are not doing well or get worse.    Thank you for letting us be a part of your medical care team.  It is a privilege we respect greatly.  We hope these instructions will help you stay on track for a fast and full recovery!   Information on my medicine - ELIQUIS (apixaban)  This medication  education was reviewed with me or my healthcare representative as part of my discharge preparation.  The pharmacist that spoke  with me during my hospital stay was:    Why was Eliquis prescribed for you? Eliquis was prescribed for you to reduce the risk of blood clots forming after orthopedic surgery.    What do You need to know about Eliquis? Take your Eliquis TWICE DAILY - one tablet in the morning and one tablet in the evening with or without food.  It would be best to take the dose about the same time each day.  If you have difficulty swallowing the tablet whole please discuss with your pharmacist how to take the medication safely.  Take Eliquis exactly as prescribed by your doctor and DO NOT stop taking Eliquis without talking to the doctor who prescribed the medication.  Stopping without other medication to take the place of Eliquis may increase your risk of developing a clot.  After discharge, you should have regular check-up appointments with your healthcare provider that is prescribing your Eliquis.  What do you do if you miss a dose? If a dose of ELIQUIS is not taken at the scheduled time, take it as soon as possible on the same day and twice-daily administration should be resumed.  The dose should not be doubled to make up for a missed dose.  Do not take more than one tablet of ELIQUIS at the same time.  Important Safety Information A possible side effect of Eliquis is bleeding. You should call your healthcare provider right away if you experience any of the following: Bleeding from an injury or your nose that does not stop. Unusual colored urine (red or dark brown) or unusual colored stools (red or black). Unusual bruising for unknown reasons. A serious fall or if you hit your head (even if there is no bleeding).  Some medicines may interact with Eliquis and might increase your risk of bleeding or clotting while on Eliquis. To help avoid this, consult your healthcare  provider or pharmacist prior to using any new prescription or non-prescription medications, including herbals, vitamins, non-steroidal anti-inflammatory drugs (NSAIDs) and supplements.  This website has more information on Eliquis (apixaban): http://www.eliquis.com/eliquis/home

## 2023-02-18 NOTE — Anesthesia Procedure Notes (Signed)
Spinal  Patient location during procedure: OR Start time: 02/18/2023 7:35 AM End time: 02/18/2023 7:40 AM Reason for block: surgical anesthesia Staffing Performed: resident/CRNA  Resident/CRNA: [place], Milia Warth C, CRNA Performed by: [place], Dennisha Mouser C, CRNA Authorized by: [name], MD   Preanesthetic Checklist Completed: patient identified, IV checked, site marked, risks and benefits discussed, surgical consent, monitors and equipment checked, pre-op evaluation and timeout performed Spinal Block Patient position: sitting Prep: DuraPrep and site prepped and draped Patient monitoring: heart rate, cardiac monitor, continuous pulse ox and blood pressure Approach: midline Location: L3-4 Injection technique: single-shot Needle Needle type: Pencan and Sprotte  Needle gauge: 24 G Needle length: 9 cm Assessment Sensory level: T4 Events: CSF return Additional Notes IV functioning, monitors applied to pt. Expiration date of kit checked and confirmed to be in date. Sterile prep and drape, hand hygiene and sterile gloved used. Pt was positioned and spine was prepped in sterile fashion. Skin was anesthetized with lidocaine. Free flow of clear CSF obtained prior to injecting local anesthetic into CSF x 1 attempt. Spinal needle aspirated freely following injection. Needle was carefully withdrawn, and pt tolerated procedure well. Loss of motor and sensory on exam post injection.

## 2023-02-18 NOTE — Interval H&P Note (Signed)
History and Physical Interval Note:  02/18/2023 7:19 AM  Deanna [name]  has presented today for surgery, with the diagnosis of Right hip osteoarthrits.  The various methods of treatment have been discussed with the patient and family. After consideration of risks, benefits and other options for treatment, the patient has consented to  Procedure(s): TOTAL HIP ARTHROPLASTY ANTERIOR APPROACH (Right) as a surgical intervention.  The patient's history has been reviewed, patient examined, no change in status, stable for surgery.  I have reviewed the patient's chart and labs.  Questions were answered to the patient's satisfaction.     Shelda Pal

## 2023-02-18 NOTE — Transfer of Care (Signed)
Immediate Anesthesia Transfer of Care Note  Patient: Deanna [name]  Procedure(s) Performed: TOTAL HIP ARTHROPLASTY ANTERIOR APPROACH (Right: Hip)  Patient Location: PACU  Anesthesia Type:Spinal  Level of Consciousness: drowsy  Airway & Oxygen Therapy: Patient Spontanous Breathing and Patient connected to face mask oxygen  Post-op Assessment: Report given to RN and Post -op Vital signs reviewed and stable  Post vital signs: BP low, Neo gtt restarted, fluids and [place], MDA notified  Last Vitals:  Vitals Value Taken Time  BP 60/33 02/18/23 0915  Temp    Pulse 45 02/18/23 0918  Resp 14 02/18/23 0918  SpO2 100 % 02/18/23 0918  Vitals shown include unfiled device data.  Last Pain:  Vitals:   02/18/23 0619  TempSrc: Oral  PainSc:          Complications: No notable events documented.

## 2023-02-18 NOTE — Anesthesia Postprocedure Evaluation (Signed)
Anesthesia Post Note  Patient: Deanna [name]  Procedure(s) Performed: TOTAL HIP ARTHROPLASTY ANTERIOR APPROACH (Right: Hip)     Patient location during evaluation: Nursing Unit Anesthesia Type: Spinal Level of consciousness: oriented and awake and alert Pain management: pain level controlled Vital Signs Assessment: post-procedure vital signs reviewed and stable Respiratory status: spontaneous breathing and respiratory function stable Cardiovascular status: blood pressure returned to baseline and stable Postop Assessment: no headache, no backache, no apparent nausea or vomiting and patient able to bend at knees Anesthetic complications: no   No notable events documented.  Last Vitals:  Vitals:   02/18/23 1115 02/18/23 1130  BP: (!) 90/48 (!) 98/52  Pulse: (!) 59 61  Resp: 16 16  Temp:    SpO2: 92% 92%    Last Pain:  Vitals:   02/18/23 1130  TempSrc:   PainSc: 0-No pain                 [name]

## 2023-02-18 NOTE — Op Note (Signed)
NAME:  Deanna [name]                ACCOUNT NO.: [id number]      MEDICAL RECORD NO.: [id number]      FACILITY:  [facility]      PHYSICIAN:  Shelda Pal  DATE OF BIRTH:  12-24-1945     DATE OF PROCEDURE:  02/18/2023                                 OPERATIVE REPORT         PREOPERATIVE DIAGNOSIS: Right  hip osteoarthritis.      POSTOPERATIVE DIAGNOSIS:  Right hip osteoarthritis.      PROCEDURE:  Right total hip replacement through an anterior approach   utilizing DePuy THR system, component size 50 mm pinnacle cup, a size 32+4 neutral   Altrex liner, a size 3 Hi Tri Lock stem with a 32+5 delta ceramic   ball.      SURGEON:  Madlyn Frankel. Charlann Boxer, M.D.      ASSISTANT:  Rosalene Billings, PA-C     ANESTHESIA:  Spinal.      SPECIMENS:  None.      COMPLICATIONS:  None.      BLOOD LOSS:  400 cc     DRAINS:  None.      INDICATION OF THE PROCEDURE:  Deanna [name] is a 77 y.o. female who had   presented to office for evaluation of right hip pain.  Radiographs revealed   progressive degenerative changes with bone-on-bone   articulation of the  hip joint, including subchondral cystic changes and osteophytes.  The patient had painful limited range of   motion significantly affecting their overall quality of life and function.  The patient was failing to    respond to conservative measures including medications and/or injections and activity modification and at this point was ready   to proceed with more definitive measures.  Consent was obtained for   benefit of pain relief.  Specific risks of infection, DVT, component   failure, dislocation, neurovascular injury, and need for revision surgery were reviewed in the office. She has a history of previously performed left THR that she has done well with.     PROCEDURE IN DETAIL:  The patient was brought to operative theater.   Once adequate anesthesia, preoperative antibiotics, 2 gm of Ancef, 1 gm of Tranexamic Acid,  and 10 mg of Decadron were administered, the patient was positioned supine on the [employer] table.  Once the patient was safely positioned with adequate padding of boney prominences we predraped out the hip, and used fluoroscopy to confirm orientation of the pelvis.      The right hip was then prepped and draped from proximal iliac crest to   mid thigh with a shower curtain technique.      Time-out was performed identifying the patient, planned procedure, and the appropriate extremity.     An incision was then made 2 cm lateral to the   anterior superior iliac spine extending over the orientation of the   tensor fascia lata muscle and sharp dissection was carried down to the   fascia of the muscle.      The fascia was then incised.  The muscle belly was identified and swept   laterally and retractor placed along the superior neck.  Following   cauterization of the circumflex vessels  and removing some pericapsular   fat, a second cobra retractor was placed on the inferior neck.  A T-capsulotomy was made along the line of the   superior neck to the trochanteric fossa, then extended proximally and   distally.  Tag sutures were placed and the retractors were then placed   intracapsular.  We then identified the trochanteric fossa and   orientation of my neck cut and then made a neck osteotomy with the femur on traction.  The femoral   head was removed without difficulty or complication.  Traction was let   off and retractors were placed posterior and anterior around the   acetabulum.      The labrum and foveal tissue were debrided.  I began reaming with a 45 mm   reamer and reamed up to 49 mm reamer with good bony bed preparation and a 50 mm  cup was chosen.  The final 50 mm Pinnacle cup was then impacted under fluoroscopy to confirm the depth of penetration and orientation with respect to   Abduction and forward flexion.  A screw was placed into the ilium followed by the hole eliminator.  The  final   32+4 neutral Altrex liner was impacted with good visualized rim fit.  The cup was positioned anatomically within the acetabular portion of the pelvis.      At this point, the femur was rolled to 100 degrees.  Further capsule was   released off the inferior aspect of the femoral neck.  I then   released the superior capsule proximally.  With the leg in a neutral position the hook was placed laterally   along the femur under the vastus lateralis origin and elevated manually and then held in position using the hook attachment on the bed.  The leg was then extended and adducted with the leg rolled to 100   degrees of external rotation.  Retractors were placed along the medial calcar and posteriorly over the greater trochanter.  Once the proximal femur was fully   exposed, I used a box osteotome to set orientation.  I then began   broaching with the starting chili pepper broach and passed this by hand and then broached up to 3.  With the 3 broach in place I chose a high offset neck and did several trial reductions.  The offset was appropriate, leg lengths   appeared to be equal best matched with the +5 head ball trial confirmed radiographically.   Given these findings, I went ahead and dislocated the hip, repositioned all   retractors and positioned the right hip in the extended and abducted position.  The final 3 Hi Tri Lock stem was   chosen and it was impacted down to the level of neck cut.  Based on this   and the trial reductions, a final 32+5 delta ceramic ball was chosen and   impacted onto a clean and dry trunnion, and the hip was reduced.  The   hip had been irrigated throughout the case again at this point.  I did   reapproximate the superior capsular leaflet to the anterior leaflet   using #1 Vicryl.  The fascia of the   tensor fascia lata muscle was then reapproximated using #1 Vicryl and #0 Stratafix sutures.  The   remaining wound was closed with 2-0 Vicryl and running 4-0  Monocryl.   The hip was cleaned, dried, and dressed sterilely using Dermabond and   Aquacel dressing.  The patient was then  brought   to recovery room in stable condition tolerating the procedure well.    Rosalene Billings, PA-C was present for the entirety of the case involved from   preoperative positioning, perioperative retractor management, general   facilitation of the case, as well as primary wound closure as assistant.            Madlyn Frankel Charlann Boxer, M.D.        02/18/2023 8:56 AM

## 2023-02-18 NOTE — Evaluation (Signed)
Physical Therapy Evaluation Patient Details Name: Deanna [name] MRN: [id number] DOB: Oct 27, 1945 Today's Date: 02/18/2023  History of Present Illness  Pt s/p R THR and with hx of L THR (22), osteoporosis, chronic pain syndrome, spinal stenosis and fusion, LBBB and very recent dx of a-fib  Clinical Impression  Pt s/p R THR and presents with functional mobility limitations 2* decreased R LE strength/ROM, generalized weakness/deconditioning, post op pain and ambulatory balance deficits.  Pt expressing concern regarding ability to manage at home post op and expressing desire to transfer to rehab setting at dc on the recommendation of her cardiologist.  Physician aware.        If plan is discharge home, recommend the following: A lot of help with walking and/or transfers;A lot of help with bathing/dressing/bathroom;Two people to help with bathing/dressing/bathroom;Assist for transportation;Help with stairs or ramp for entrance   Can travel by private vehicle        Equipment Recommendations None recommended by PT  Recommendations for Other Services       Functional Status Assessment Patient has had a recent decline in their functional status and demonstrates the ability to make significant improvements in function in a reasonable and predictable amount of time.     Precautions / Restrictions Precautions Precautions: Fall Restrictions Weight Bearing Restrictions: No Other Position/Activity Restrictions: WBAT      Mobility  Bed Mobility Overal bed mobility: Needs Assistance Bed Mobility: Supine to Sit     Supine to sit: Mod assist     General bed mobility comments: Increased time with cues for sequence and use of L LE to self assist.  Physical assist to manage R LE, to control trunk and to complete rotation using bed pad    Transfers Overall transfer level: Needs assistance Equipment used: Rolling walker (2 wheels) Transfers: Sit to/from Stand, Bed to  chair/wheelchair/BSC Sit to Stand: Min assist, Mod assist   Step pivot transfers: Min assist, Mod assist       General transfer comment: cues for LE management and use of UEs to self assist; physical assist to bring wt up and fwd and to balance in standing with RW.  Step pvt bed to recliner with RW    Ambulation/Gait Ambulation/Gait assistance: Min assist, Mod assist Gait Distance (Feet): 2 Feet Assistive device: Rolling walker (2 wheels) Gait Pattern/deviations: Step-to pattern, Decreased step length - right, Decreased step length - left, Shuffle, Trunk flexed Gait velocity: decr     General Gait Details: cues for sequence, posture, position from [employer]            Wheelchair Mobility     Tilt Bed    Modified Rankin (Stroke Patients Only)       Balance Overall balance assessment: Needs assistance Sitting-balance support: No upper extremity supported, Feet supported Sitting balance-Leahy Scale: Fair     Standing balance support: Bilateral upper extremity supported Standing balance-Leahy Scale: Poor                               Pertinent Vitals/Pain Pain Assessment Pain Assessment: Faces Faces Pain Scale: Hurts whole lot Pain Location: R hip Pain Descriptors / Indicators: Aching, Grimacing, Guarding, Sore Pain Intervention(s): Limited activity within patient's tolerance, Monitored during session, RN gave pain meds during session, Ice applied    Home Living Family/patient expects to be discharged to:: Private residence Living Arrangements: Alone Available Help at Discharge: Family;Available 24 hours/day (dtr is able  to work from home) Type of Home: Apartment Home Access: Stairs to enter Entrance Stairs-Rails: Right Entrance Stairs-Number of Steps: 17   Home Layout: One level Home Equipment: Agricultural consultant (2 wheels)      Prior Function Prior Level of Function : Independent/Modified Independent             Mobility Comments:  using RW as needed       Extremity/Trunk Assessment   Upper Extremity Assessment Upper Extremity Assessment: Overall WFL for tasks assessed    Lower Extremity Assessment Lower Extremity Assessment: RLE deficits/detail       Communication   Communication Communication: No apparent difficulties  Cognition Arousal: Alert Behavior During Therapy: WFL for tasks assessed/performed Overall Cognitive Status: Within Functional Limits for tasks assessed                                          General Comments      Exercises Total Joint Exercises Ankle Circles/Pumps: AROM, Both, 15 reps, Supine   Assessment/Plan    PT Assessment Patient needs continued PT services  PT Problem List Decreased strength;Decreased range of motion;Decreased activity tolerance;Decreased balance;Decreased mobility;Decreased knowledge of use of DME;Pain       PT Treatment Interventions DME instruction;Gait training;Stair training;Functional mobility training;Therapeutic activities;Therapeutic exercise;Patient/family education    PT Goals (Current goals can be found in the Care Plan section)  Acute Rehab PT Goals Patient Stated Goal: Regain IND PT Goal Formulation: With patient Time For Goal Achievement: 02/25/23 Potential to Achieve Goals: Good    Frequency 7X/week     Co-evaluation               AM-PAC PT "6 Clicks" Mobility  Outcome Measure Help needed turning from your back to your side while in a flat bed without using bedrails?: A Lot Help needed moving from lying on your back to sitting on the side of a flat bed without using bedrails?: A Lot Help needed moving to and from a bed to a chair (including a wheelchair)?: A Lot Help needed standing up from a chair using your arms (e.g., wheelchair or bedside chair)?: A Lot Help needed to walk in hospital room?: Total Help needed climbing 3-5 steps with a railing? : Total 6 Click Score: 10    End of Session Equipment  Utilized During Treatment: Gait belt Activity Tolerance: Patient limited by fatigue;Patient limited by pain Patient left: in chair;with call bell/phone within reach;with chair alarm set;with family/visitor present Nurse Communication: Mobility status PT Visit Diagnosis: Unsteadiness on feet (R26.81);Muscle weakness (generalized) (M62.81);Difficulty in walking, not elsewhere classified (R26.2);Pain Pain - Right/Left: Right Pain - part of body: Hip    Time: 1610-9604 PT Time Calculation (min) (ACUTE ONLY): 23 min   Charges:   PT Evaluation $PT Eval Low Complexity: 1 Low   PT General Charges $$ ACUTE PT VISIT: 1 Visit         Deanna [name] PT Acute Rehabilitation Services Pager [phone] Office [phone]   Deanna [name] 02/18/2023, 4:20 PM

## 2023-02-18 NOTE — H&P (Signed)
TOTAL HIP ADMISSION H&P  Patient is admitted for right total hip arthroplasty.  Therapy Plans: HEP Disposition: Home with daughter Planned DVT Prophylaxis: aspirin 81mg  BID DME needed: none PCP: Dr. Debria Garret (clearance received), also manages pain for her Cardio: Dr. Lenis Noon - clearance received TXA: Allergies: sulfa - rash, gabapentin - makes her eyeballs numb , codeine - GI issues Anesthesia Concerns: none BMI: 30 Last HgbA1c: Not diabetic   Other: - hydrocodone, robaxin, tylenol, celebrex - No hx of VTE - Hx of non hodgkins lymphoma - Takes hydrocodone 5 mg q6h at baseline    Subjective:  Chief Complaint: right hip pain  HPI: Deanna [name], 77 y.o. female, has a history of pain and functional disability in the right hip(s) due to arthritis and patient has failed non-surgical conservative treatments for greater than 12 weeks to include NSAID's and/or analgesics and activity modification.  Onset of symptoms was gradual starting 2 years ago with gradually worsening course since that time.The patient noted no past surgery on the right hip(s).  Patient currently rates pain in the right hip at 8 out of 10 with activity. Patient has worsening of pain with activity and weight bearing, pain that interfers with activities of daily living, and pain with passive range of motion. Patient has evidence of joint space narrowing by imaging studies. This condition presents safety issues increasing the risk of falls.  There is no current active infection.  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   S/P left total hip arthroplasty 09/17/2020   Pre-operative cardiovascular examination 09/03/2020   Arthritis    Colon polyps    GERD (gastroesophageal reflux disease)     Hypertension    Spinal stenosis    Short sleeper 04/29/2020   Snoring 04/29/2020   Insomnia 03/06/2020   Abnormal glucose 03/06/2020   Sacroiliitis (HCC) 08/02/2019   Sensorineural hearing loss (SNHL) of both ears 07/05/2019   Tinnitus aurium, bilateral 07/05/2019   Radial tunnel syndrome, right 11/04/2016   Lumbar radiculopathy 01/09/2016   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 11/26/2015   Age-related osteoporosis without current pathological fracture 11/22/2015   Osteopenia determined by x-ray 11/22/2015   Allergic rhinitis due to pollen 08/19/2015   GAD (generalized anxiety disorder) 08/19/2015   LVH (left ventricular 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 05/23/2015   Chronic pain syndrome 04/01/2015   Non-rheumatic mitral regurgitation 03/21/2015   History of Bell's palsy 03/21/2015   Other intervertebral disc degeneration, lumbar region 01/29/2015   Spinal stenosis, lumbar region without neurogenic claudication 11/22/2014   Radiculopathy, lumbar region 11/22/2014   Cervical disc disorder 05/10/2009   Female cystocele 02/27/2009   Hx of adenomatous colonic polyps 12/20/2008   Celiac disease 07/12/2006   Left bundle-branch block 05/20/2006   Chronic cough 02/15/2006   Screening for colon cancer 04/17/2005   Unspecified asthma, uncomplicated 12/25/2004   Incomplete uterovaginal prolapse 03/04/2004   Primary osteoarthritis, right hand 02/28/2002   Carpal tunnel syndrome, bilateral 02/28/2002   Trigger thumb, unspecified thumb 02/28/2002   Rhinitis, allergic 02/24/2002   Essential hypertension 07/20/2000   Past Medical History:  Diagnosis Date   Abnormal glucose 03/06/2020   ACS (acute coronary syndrome) (HCC)    Age-related osteoporosis without current pathological fracture 11/22/2015   Overview:  Femoral neck  Formatting of this note might be different from the original. Femoral neck   Allergic rhinitis due to pollen 08/19/2015   Allergy     Arthritis    Carpal tunnel syndrome, bilateral 02/28/2002   Overview:  H/O CTS   Celiac disease    Cervical disc disorder 05/10/2009   Chronic cough 02/15/2006   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 11/26/2015   Formatting of this note might be different from the original. Added automatically from request for surgery [id number]   Chronic pain syndrome 04/01/2015   Colon polyps    Dyspnea    Essential hypertension 07/20/2000   Overview:  HBP   Family history of adverse reaction to anesthesia    mother delerium   Female cystocele 02/27/2009   GAD (generalized anxiety disorder) 08/19/2015   GERD (gastroesophageal reflux disease)    Heart murmur    History of Bell's palsy 03/21/2015   Hx of adenomatous colonic polyps 12/20/2008   Overview:  Colonoscopy 07/2003 - [facility] - polyps removed - recommended repeat in 3 years Colonoscopy 09/2011 - Dr. Darrol Jump - hyperplastic polyp(s) were removed - repeat in 5 years   Hypertension    Incomplete uterovaginal prolapse 03/04/2004   Insomnia    Left bundle-branch block 05/20/2006   Lumbar radiculopathy 01/09/2016   Formatting of this note might be different from the original. 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 Femoral neck   Other intervertebral disc degeneration, lumbar region 01/29/2015   Pre-operative cardiovascular examination 09/03/2020   Primary osteoarthritis, right hand 02/28/2002   Overview:  OA HANDS, ESP THUMBS   Radial tunnel syndrome, right 11/04/2016   Radiculopathy, lumbar region 11/22/2014   Overview:  Added automatically from request for surgery [id number]   Rhinitis, allergic 02/24/2002   Overview:  ALLERGY SYMPTOMS - states was seen  by allergist in past and informed allergic to dust   S/P left total hip arthroplasty 09/17/2020   Sacroiliitis (HCC) 08/02/2019   Screening for colon cancer 04/17/2005   Formatting of this note might be different from the original. 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 TRIGGER THUMBS - started spring 2003. Injected 02/28/02   Unspecified asthma, uncomplicated 12/25/2004    Past Surgical History:  Procedure Laterality Date   ABDOMINAL HYSTERECTOMY  2010   APPENDECTOMY  1969   CHOLECYSTECTOMY  1969   SPINAL FUSION  01/2016   TONSILLECTOMY AND ADENOIDECTOMY  1956   TOTAL HIP ARTHROPLASTY Left 09/17/2020   Procedure: TOTAL HIP ARTHROPLASTY ANTERIOR APPROACH;  Surgeon: Durene Romans, MD;  Location: [facility];  Service: Orthopedics;  Laterality: Left;  70 mins    Current Facility-Administered Medications  Medication Dose Route Frequency Provider Last Rate Last Admin   ceFAZolin (ANCEF) IVPB 2g/100 mL premix  2 g Intravenous On Call to OR Cassandria Anger, PA-C       dexamethasone (DECADRON) injection 8 mg  8  mg Intravenous Once Cassandria Anger, PA-C       lactated ringers infusion   Intravenous Continuous Cassandria Anger, PA-C       lactated ringers infusion   Intravenous Continuous Linton Rump, MD 10 mL/hr at 02/18/23 0610 New Bag at 02/18/23 0610   povidone-iodine 10 % swab 2 Application  2 Application Topical Once Cassandria Anger, PA-C       tranexamic acid (CYKLOKAPRON) IVPB 1,000 mg  1,000 mg Intravenous To OR Cassandria Anger, PA-C       Allergies  Allergen Reactions   Codeine Nausea And Vomiting and Palpitations     Chest pains, acid reflux  Other reaction(s): Nausea and/or Vomiting, Other, Other (See Comments), Other (See Comments) Chest pain and acid reflux Chest pains, acid reflux  Codeine made her feel like she was having a heart attack. Chest pain Feels like she is having a heart attack. Feels like she is having a heart attack. Codeine made her feel like she was having a heart attack. Chest pain Feels like she is having a heart attack. Chest pain Feels like she is having a heart attack. Chest pains, acid reflux    Gluten Meal Diarrhea and Other (See Comments)    Severe diarrhea per member    Dust Mite Extract     Upper Respiratory issues    Gabapentin     Eyes went numb and blurry   Sulfa Antibiotics Other (See Comments)   Terbinafine Hcl Rash    Social History   Tobacco Use   Smoking status: Former    Current packs/day: 0.00    Average packs/day: 0.5 packs/day for 30.0 years (15.0 ttl pk-yrs)    Types: Cigarettes    Start date: 01/01/1968    Quit date: 12/31/1997    Years since quitting: 25.1   Smokeless tobacco: Never   Tobacco comments:    Quit in 1999  Substance Use Topics   Alcohol use: Not Currently    Family History  Problem Relation Age of Onset   Arthritis Mother    Diabetes Mother    Pancreatic cancer Mother 13   Early death Father    Leukemia Father    Leukemia Sister    Early death Sister    Diabetes Sister      Review of Systems  Constitutional:  Negative for chills and fever.  Respiratory:  Negative for cough and shortness of breath.   Cardiovascular:  Negative for chest pain.  Gastrointestinal:  Negative for nausea and vomiting.  Musculoskeletal:  Positive for arthralgias.     Objective:  Physical Exam Well nourished and well developed. General: Alert and oriented x3, cooperative and pleasant, no acute distress. Head: normocephalic, atraumatic, neck supple. Eyes: EOMI.  Musculoskeletal: Right Hip: No lateral tenderness Significant pain  with attempts at passive ROM, with very minimal internal/external rotation  Calves soft and nontender. Motor function intact in LE. Strength 5/5 LE bilaterally. Neuro: Distal pulses 2+. Sensation to light touch intact in LE.  Vital signs in last 24 hours: Temp:  [98.1 F (36.7 C)] 98.1 F (36.7 C) (09/19 0619) Pulse Rate:  [56] 56 (09/19 0619) Resp:  [118] 118 (09/19 0619) BP: (140)/(68) 140/68 (09/19 0619) SpO2:  [96 %] 96 % (09/19 0619) Weight:  [70.9 kg] 70.9 kg (09/19 0551)  Labs:   Estimated body mass index is 28.59 kg/m as calculated from the following:   Height as of this encounter: 5\' 2"  (1.575 m).   Weight  as of this encounter: 70.9 kg.   Imaging Review Plain radiographs demonstrate severe degenerative joint disease of the right hip(s). The bone quality appears to be adequate for age and reported activity level.      Assessment/Plan:  End stage arthritis, right hip(s)  The patient history, physical examination, clinical judgement of the provider and imaging studies are consistent with end stage degenerative joint disease of the right hip(s) and total hip arthroplasty is deemed medically necessary. The treatment options including medical management, injection therapy, arthroscopy and arthroplasty were discussed at length. The risks and benefits of total hip arthroplasty were presented and reviewed. The risks due to aseptic loosening, infection, stiffness, dislocation/subluxation,  thromboembolic complications and other imponderables were discussed.  The patient acknowledged the explanation, agreed to proceed with the plan and consent was signed. Patient is being admitted for inpatient treatment for surgery, pain control, PT, OT, prophylactic antibiotics, VTE prophylaxis, progressive ambulation and ADL's and discharge planning.The patient is planning to be discharged  home.   Rosalene Billings, PA-C Orthopedic Surgery [facility] [place] [phone]

## 2023-02-18 NOTE — Care Plan (Signed)
Ortho Bundle Case Management Note  Patient Details  Name: Deanna [name] MRN: [id number] Date of Birth: [birth year]                  R THA on 02/18/23.  DCP: Home with daughter.  DME: No needs. Has RW.  PT: HEP   DME Arranged:  N/A DME Agency:       Additional Comments: Please contact me with any questions of if this plan should need to change.    Despina Pole, CCM Case Manager, [facility]  [phone] 02/18/2023, 9:49 AM

## 2023-02-19 ENCOUNTER — Encounter (HOSPITAL_COMMUNITY): Payer: Self-pay | Admitting: Orthopedic Surgery

## 2023-02-19 DIAGNOSIS — Z9181 History of falling: Secondary | ICD-10-CM | POA: Diagnosis not present

## 2023-02-19 DIAGNOSIS — Z8261 Family history of arthritis: Secondary | ICD-10-CM | POA: Diagnosis not present

## 2023-02-19 DIAGNOSIS — Z96642 Presence of left artificial hip joint: Secondary | ICD-10-CM | POA: Diagnosis present

## 2023-02-19 DIAGNOSIS — M19042 Primary osteoarthritis, left hand: Secondary | ICD-10-CM | POA: Diagnosis present

## 2023-02-19 DIAGNOSIS — M810 Age-related osteoporosis without current pathological fracture: Secondary | ICD-10-CM | POA: Diagnosis present

## 2023-02-19 DIAGNOSIS — I119 Hypertensive heart disease without heart failure: Secondary | ICD-10-CM | POA: Diagnosis present

## 2023-02-19 DIAGNOSIS — Z87891 Personal history of nicotine dependence: Secondary | ICD-10-CM | POA: Diagnosis not present

## 2023-02-19 DIAGNOSIS — M1611 Unilateral primary osteoarthritis, right hip: Secondary | ICD-10-CM | POA: Diagnosis present

## 2023-02-19 DIAGNOSIS — Z86010 Personal history of colonic polyps: Secondary | ICD-10-CM | POA: Diagnosis not present

## 2023-02-19 DIAGNOSIS — Z9049 Acquired absence of other specified parts of digestive tract: Secondary | ICD-10-CM | POA: Diagnosis not present

## 2023-02-19 DIAGNOSIS — F411 Generalized anxiety disorder: Secondary | ICD-10-CM | POA: Diagnosis present

## 2023-02-19 DIAGNOSIS — I4891 Unspecified atrial fibrillation: Secondary | ICD-10-CM | POA: Diagnosis present

## 2023-02-19 DIAGNOSIS — Z8572 Personal history of non-Hodgkin lymphomas: Secondary | ICD-10-CM | POA: Diagnosis not present

## 2023-02-19 DIAGNOSIS — Z79899 Other long term (current) drug therapy: Secondary | ICD-10-CM | POA: Diagnosis not present

## 2023-02-19 DIAGNOSIS — M19041 Primary osteoarthritis, right hand: Secondary | ICD-10-CM | POA: Diagnosis present

## 2023-02-19 DIAGNOSIS — I340 Nonrheumatic mitral (valve) insufficiency: Secondary | ICD-10-CM | POA: Diagnosis present

## 2023-02-19 DIAGNOSIS — Z981 Arthrodesis status: Secondary | ICD-10-CM | POA: Diagnosis not present

## 2023-02-19 DIAGNOSIS — K900 Celiac disease: Secondary | ICD-10-CM | POA: Diagnosis present

## 2023-02-19 DIAGNOSIS — J45909 Unspecified asthma, uncomplicated: Secondary | ICD-10-CM | POA: Diagnosis present

## 2023-02-19 DIAGNOSIS — E782 Mixed hyperlipidemia: Secondary | ICD-10-CM | POA: Diagnosis present

## 2023-02-19 DIAGNOSIS — G894 Chronic pain syndrome: Secondary | ICD-10-CM | POA: Diagnosis present

## 2023-02-19 DIAGNOSIS — K219 Gastro-esophageal reflux disease without esophagitis: Secondary | ICD-10-CM | POA: Diagnosis present

## 2023-02-19 DIAGNOSIS — Z90710 Acquired absence of both cervix and uterus: Secondary | ICD-10-CM | POA: Diagnosis not present

## 2023-02-19 DIAGNOSIS — Z806 Family history of leukemia: Secondary | ICD-10-CM | POA: Diagnosis not present

## 2023-02-19 LAB — CBC
HCT: 30 % — ABNORMAL LOW (ref 36.0–46.0)
Hemoglobin: 9.9 g/dL — ABNORMAL LOW (ref 12.0–15.0)
MCH: 28.9 pg (ref 26.0–34.0)
MCHC: 33 g/dL (ref 30.0–36.0)
MCV: 87.7 fL (ref 80.0–100.0)
Platelets: 211 10*3/uL (ref 150–400)
RBC: 3.42 MIL/uL — ABNORMAL LOW (ref 3.87–5.11)
RDW: 13.1 % (ref 11.5–15.5)
WBC: 11.3 10*3/uL — ABNORMAL HIGH (ref 4.0–10.5)
nRBC: 0 % (ref 0.0–0.2)

## 2023-02-19 LAB — BASIC METABOLIC PANEL
Anion gap: 7 (ref 5–15)
BUN: 17 mg/dL (ref 8–23)
CO2: 25 mmol/L (ref 22–32)
Calcium: 8.4 mg/dL — ABNORMAL LOW (ref 8.9–10.3)
Chloride: 102 mmol/L (ref 98–111)
Creatinine, Ser: 0.58 mg/dL (ref 0.44–1.00)
GFR, Estimated: >60 mL/min (ref >60)
Glucose, Bld: 126 mg/dL — ABNORMAL HIGH (ref 70–99)
Potassium: 3.9 mmol/L (ref 3.5–5.1)
Sodium: 134 mmol/L — ABNORMAL LOW (ref 135–145)

## 2023-02-19 MED ORDER — METHOCARBAMOL 500 MG PO TABS: 500.0000 mg | ORAL_TABLET | Freq: Four times a day (QID) | ORAL | 2 refills | Status: DC | PRN

## 2023-02-19 MED ORDER — POLYETHYLENE GLYCOL 3350 17 G PO PACK: 17.0000 g | PACK | Freq: Two times a day (BID) | ORAL | 0 refills | Status: DC

## 2023-02-19 MED ORDER — SENNA 8.6 MG PO TABS: 2.0000 | ORAL_TABLET | Freq: Every day | ORAL | 0 refills | Status: AC

## 2023-02-19 MED ORDER — HYDROCODONE-ACETAMINOPHEN 5-325 MG PO TABS: 1.0000 | ORAL_TABLET | ORAL | 0 refills | Status: DC | PRN

## 2023-02-19 NOTE — Progress Notes (Signed)
Physical Therapy Treatment Patient Details Name: Deanna [name] MRN: [id number] DOB: April 01, 1946 Today's Date: 02/19/2023   History of Present Illness Pt s/p R THR and with hx of L THR (22), osteoporosis, chronic pain syndrome, spinal stenosis and fusion, LBBB and very recent dx of a-fib    PT Comments  Pt continues cooperative and up to ambulate limited distance in room but limited this am by c/o pain and fatigue.   If plan is discharge home, recommend the following: A lot of help with walking and/or transfers;A lot of help with bathing/dressing/bathroom;Two people to help with bathing/dressing/bathroom;Assist for transportation;Help with stairs or ramp for entrance   Can travel by private vehicle        Equipment Recommendations  None recommended by PT    Recommendations for Other Services       Precautions / Restrictions Precautions Precautions: Fall Restrictions Weight Bearing Restrictions: No RLE Weight Bearing: Weight bearing as tolerated Other Position/Activity Restrictions: WBAT     Mobility  Bed Mobility Overal bed mobility: Needs Assistance Bed Mobility: Supine to Sit     Supine to sit: Mod assist     General bed mobility comments: increased time with cues for sequence and use of L LE to self assist.  HOB elevate, use of rails and physical assist to manage R LE and to control trunk    Transfers Overall transfer level: Needs assistance Equipment used: Rolling walker (2 wheels) Transfers: Sit to/from Stand Sit to Stand: Min assist, Mod assist           General transfer comment: cues for LE management and use of UEs to self assist; physical assist to bring wt up and fwd and to balance in standing with RW.    Ambulation/Gait Ambulation/Gait assistance: Min assist, Mod assist Gait Distance (Feet): 9 Feet Assistive device: Rolling walker (2 wheels) Gait Pattern/deviations: Step-to pattern, Decreased step length - right, Decreased step length - left,  Shuffle, Trunk flexed Gait velocity: decr     General Gait Details: cues for sequence, posture, position from [employer]             Wheelchair Mobility     Tilt Bed    Modified Rankin (Stroke Patients Only)       Balance Overall balance assessment: Needs assistance Sitting-balance support: No upper extremity supported, Feet supported Sitting balance-Leahy Scale: Fair     Standing balance support: Bilateral upper extremity supported Standing balance-Leahy Scale: Poor                              Cognition Arousal: Alert Behavior During Therapy: WFL for tasks assessed/performed Overall Cognitive Status: Within Functional Limits for tasks assessed                                          Exercises Total Joint Exercises Ankle Circles/Pumps: AROM, Both, 15 reps, Supine Quad Sets: AROM, Both, 10 reps, Supine Heel Slides: AAROM, Right, 20 reps, Supine Hip ABduction/ADduction: AAROM, Right, 15 reps, Supine    General Comments        Pertinent Vitals/Pain Pain Assessment Pain Assessment: 0-10 Pain Score: 7  Pain Location: R hip Pain Descriptors / Indicators: Aching, Grimacing, Guarding, Sore Pain Intervention(s): Limited activity within patient's tolerance, Monitored during session, Premedicated before session, Ice applied    Home Living  Prior Function            PT Goals (current goals can now be found in the care plan section) Acute Rehab PT Goals Patient Stated Goal: Regain IND PT Goal Formulation: With patient Time For Goal Achievement: 02/25/23 Potential to Achieve Goals: Good Progress towards PT goals: Progressing toward goals    Frequency    7X/week      PT Plan      Co-evaluation              AM-PAC PT "6 Clicks" Mobility   Outcome Measure  Help needed turning from your back to your side while in a flat bed without using bedrails?: A Lot Help needed moving  from lying on your back to sitting on the side of a flat bed without using bedrails?: A Lot Help needed moving to and from a bed to a chair (including a wheelchair)?: A Lot Help needed standing up from a chair using your arms (e.g., wheelchair or bedside chair)?: A Lot Help needed to walk in hospital room?: Total Help needed climbing 3-5 steps with a railing? : Total 6 Click Score: 10    End of Session Equipment Utilized During Treatment: Gait belt Activity Tolerance: Patient limited by fatigue;Patient limited by pain Patient left: in chair;with call bell/phone within reach;with chair alarm set Nurse Communication: Mobility status PT Visit Diagnosis: Unsteadiness on feet (R26.81);Muscle weakness (generalized) (M62.81);Difficulty in walking, not elsewhere classified (R26.2);Pain Pain - Right/Left: Right Pain - part of body: Hip     Time: 1001-1023 PT Time Calculation (min) (ACUTE ONLY): 22 min  Charges:    $Gait Training: 8-22 mins $Therapeutic Exercise: 8-22 mins PT General Charges $$ ACUTE PT VISIT: 1 Visit                     Mauro Kaufmann PT Acute Rehabilitation Services Pager [phone] Office [phone]    Khiry Pasquariello 02/19/2023, 12:21 PM

## 2023-02-19 NOTE — Progress Notes (Signed)
Patient ID: Deanna [name], female   DOB: Apr 18, 1946, 77 y.o.   MRN: [id number] Subjective: 1 Day Post-Op Procedure(s) (LRB): TOTAL HIP ARTHROPLASTY ANTERIOR APPROACH (Right)    Patient reports pain as moderate. No events overnight Has not done much activity out of bed Right thigh pain and soreness  Objective:   VITALS:   Vitals:   02/18/23 2002 02/19/23 0458  BP: (!) 131/58 (!) 115/51  Pulse: 94 95  Resp: 18 18  Temp: 98.6 F (37 C) 98.7 F (37.1 C)  SpO2: 93% 92%    Neurovascular intact Incision: dressing C/D/I  LABS Recent Labs    02/19/23 0345  HGB 9.9*  HCT 30.0*  WBC 11.3*  PLT 211    Recent Labs    02/19/23 0345  NA 134*  K 3.9  BUN 17  CREATININE 0.58  GLUCOSE 126*    No results for input(s): "LABPT", "INR" in the last 72 hours.   Assessment/Plan: 1 Day Post-Op Procedure(s) (LRB): TOTAL HIP ARTHROPLASTY ANTERIOR APPROACH (Right)   Advance diet Up with therapy  This morning we discussed that our goals are for her to be discharged to home as opposed to nursing facilities related to increased risks of exposure to infections as well as increased rates of complications related to stays in nursing facilities.  She has some concerns regarding navigating steps to get into her place.  We discussed that I would like for her to work with physical therapy in the hospital to maximize her strength and confidence to be able to go home safely.  I feel that she would benefit from physical therapy today and tomorrow. Based on my conversation with her it is more likely than not that she will require a second night in the hospital in order to be discharged home safely. In this light we will work to make certain that she is comfortable with her current pain medicine regiment as well as hemodynamically stable with regards to her vital signs with activity.

## 2023-02-19 NOTE — Progress Notes (Signed)
Physical Therapy Treatment Patient Details Name: Deanna [name] MRN: [id number] DOB: December 06, 1945 Today's Date: 02/19/2023   History of Present Illness Pt s/p R THR and with hx of L THR (22), osteoporosis, chronic pain syndrome, spinal stenosis and fusion, LBBB and very recent dx of a-fib    PT Comments  Pt continues cooperative and therex program initiated.  Pt requesting break before attempting OOB on arrival of bfast.  Will follow up shortly to initiate ambulation    If plan is discharge home, recommend the following: A lot of help with walking and/or transfers;A lot of help with bathing/dressing/bathroom;Two people to help with bathing/dressing/bathroom;Assist for transportation;Help with stairs or ramp for entrance   Can travel by private vehicle        Equipment Recommendations  None recommended by PT    Recommendations for Other Services       Precautions / Restrictions Precautions Precautions: Fall Restrictions Weight Bearing Restrictions: No RLE Weight Bearing: Weight bearing as tolerated Other Position/Activity Restrictions: WBAT     Mobility  Bed Mobility               General bed mobility comments: OOB deferred on arrival of bfast    Transfers                        Ambulation/Gait                   Stairs             Wheelchair Mobility     Tilt Bed    Modified Rankin (Stroke Patients Only)       Balance                                            Cognition Arousal: Alert Behavior During Therapy: WFL for tasks assessed/performed Overall Cognitive Status: Within Functional Limits for tasks assessed                                          Exercises Total Joint Exercises Ankle Circles/Pumps: AROM, Both, 15 reps, Supine Quad Sets: AROM, Both, 10 reps, Supine Heel Slides: AAROM, Right, 20 reps, Supine Hip ABduction/ADduction: AAROM, Right, 15 reps, Supine    General  Comments        Pertinent Vitals/Pain Pain Assessment Pain Assessment: 0-10 Pain Score: 7  Pain Location: R hip Pain Descriptors / Indicators: Aching, Grimacing, Guarding, Sore Pain Intervention(s): Limited activity within patient's tolerance, Monitored during session, Premedicated before session, Ice applied    Home Living                          Prior Function            PT Goals (current goals can now be found in the care plan section) Acute Rehab PT Goals Patient Stated Goal: Regain IND PT Goal Formulation: With patient Time For Goal Achievement: 02/25/23 Potential to Achieve Goals: Good Progress towards PT goals: Progressing toward goals    Frequency    7X/week      PT Plan      Co-evaluation              AM-PAC PT "6 Clicks" Mobility  Outcome Measure  Help needed turning from your back to your side while in a flat bed without using bedrails?: A Lot Help needed moving from lying on your back to sitting on the side of a flat bed without using bedrails?: A Lot Help needed moving to and from a bed to a chair (including a wheelchair)?: A Lot Help needed standing up from a chair using your arms (e.g., wheelchair or bedside chair)?: A Lot Help needed to walk in hospital room?: Total Help needed climbing 3-5 steps with a railing? : Total 6 Click Score: 10    End of Session Equipment Utilized During Treatment: Gait belt Activity Tolerance: Patient limited by fatigue;Patient limited by pain Patient left: in bed;with call bell/phone within reach;with bed alarm set Nurse Communication: Mobility status PT Visit Diagnosis: Unsteadiness on feet (R26.81);Muscle weakness (generalized) (M62.81);Difficulty in walking, not elsewhere classified (R26.2);Pain Pain - Right/Left: Right Pain - part of body: Hip     Time: 0913-0928 PT Time Calculation (min) (ACUTE ONLY): 15 min  Charges:    $Therapeutic Exercise: 8-22 mins PT General Charges $$ ACUTE PT  VISIT: 1 Visit                     Mauro Kaufmann PT Acute Rehabilitation Services Pager [phone] Office [phone]    Jovie Swanner 02/19/2023, 12:16 PM

## 2023-02-19 NOTE — Plan of Care (Signed)
Problem: Education: Goal: Understanding of cardiac disease, CV risk reduction, and recovery process will improve Outcome: Progressing   Problem: Activity: Goal: Ability to tolerate increased activity will improve Outcome: Progressing   Problem: Clinical Measurements: Goal: Postoperative complications will be avoided or minimized Outcome: Progressing  Problem: Pain Management: Goal: Pain level will decrease with appropriate interventions Outcome: Progressing   Haydee Salter, RN 02/19/23 11:01 AM

## 2023-02-19 NOTE — TOC Progression Note (Addendum)
Transition of Care [facility]) - Progression Note    Patient Details  Name: Deanna [name] MRN: [id number] Date of Birth: 1945-12-28    Transition of Care [facility]) - Inpatient Brief Assessment   Patient Details  Name: Deanna [name] MRN: [id number] Date of Birth: 1945-12-02  Transition of Care [facility]) CM/SW Contact:    Amada Jupiter, LCSW Phone Number: 02/19/2023, 10:20 AM   Clinical Narrative: Met with pt and confirming that plan, per MD, is for pt to dc home with HEP.  Pt confirms she has all needed DME in the home.   Pt notes that her preference is to go SNF for rehab as help in the home is limited, however, noted to her that she had discussion with Dr. Charlann Boxer today about his plan for her to dc home and goal is to reach level of mod ind here.  (Please see MD note from today for more info).  At this time, TOC remains available to assist IF the dc plan were to change.   Transition of Care Asessment: Insurance and Status: Insurance coverage has been reviewed Patient has primary care physician: Yes Home environment has been reviewed: home alone Prior level of function:: modified independent Prior/Current Home Services: No current home services Social Determinants of Health Reivew: SDOH reviewed no interventions necessary Readmission risk has been reviewed: Yes Transition of care needs: transition of care needs identified, TOC will continue to follow        Expected Discharge Plan and Services                         DME Arranged: N/A                     Social Determinants of Health (SDOH) Interventions SDOH Screenings   Food Insecurity: No Food Insecurity (02/18/2023)  Housing: Low Risk  (02/18/2023)  Transportation Needs: No Transportation Needs (02/18/2023)  Utilities: Not At Risk (02/18/2023)  Depression (PHQ2-9): Low Risk  (01/14/2022)  Financial Resource Strain: High Risk (01/14/2022)  Physical Activity: Inactive (01/14/2022)  Social Connections: Unknown  (10/13/2021)   Received from [facility], [facility]  Stress: Stress Concern Present (01/14/2022)  Tobacco Use: Medium Risk (02/18/2023)    Readmission Risk Interventions     No data to display

## 2023-02-19 NOTE — Care Management Obs Status (Signed)
MEDICARE OBSERVATION STATUS NOTIFICATION   Patient Details  Name: Deanna [name] MRN: [id number] Date of Birth: March 17, 1946   Medicare Observation Status Notification Given:  Yes    Amada Jupiter, LCSW 02/19/2023, 11:01 AM

## 2023-02-19 NOTE — Progress Notes (Signed)
Physical Therapy Treatment Patient Details Name: Deanna [name] MRN: [id number] DOB: Apr 30, 1946 Today's Date: 02/19/2023   History of Present Illness Pt s/p R THR and with hx of L THR (22), osteoporosis, chronic pain syndrome, spinal stenosis and fusion, LBBB and very recent dx of a-fib    PT Comments  Pt continues very cooperative and with marked improvement in activity tolerance and with noted decreasing level of assist for most task.    If plan is discharge home, recommend the following: Assist for transportation;Help with stairs or ramp for entrance;A little help with walking and/or transfers;A little help with bathing/dressing/bathroom   Can travel by private vehicle        Equipment Recommendations  None recommended by PT    Recommendations for Other Services       Precautions / Restrictions Precautions Precautions: Fall Restrictions Weight Bearing Restrictions: No RLE Weight Bearing: Weight bearing as tolerated Other Position/Activity Restrictions: WBAT     Mobility  Bed Mobility Overal bed mobility: Needs Assistance Bed Mobility: Supine to Sit     Supine to sit: Mod assist     General bed mobility comments: Pt up in chair and requests back to same    Transfers Overall transfer level: Needs assistance Equipment used: Rolling walker (2 wheels) Transfers: Sit to/from Stand Sit to Stand: Min assist           General transfer comment: cues for LE management and use of UEs to self assist; physical assist to bring wt up and fwd and to balance in standing with RW.    Ambulation/Gait Ambulation/Gait assistance: Min assist Gait Distance (Feet): 46 Feet Assistive device: Rolling walker (2 wheels) Gait Pattern/deviations: Step-to pattern, Decreased step length - right, Decreased step length - left, Shuffle, Trunk flexed Gait velocity: decr     General Gait Details: Increased time with cues for sequence, posture, position from [employer]              Wheelchair Mobility     Tilt Bed    Modified Rankin (Stroke Patients Only)       Balance Overall balance assessment: Needs assistance Sitting-balance support: No upper extremity supported, Feet supported Sitting balance-Leahy Scale: Fair     Standing balance support: Single extremity supported Standing balance-Leahy Scale: Poor                              Cognition Arousal: Alert Behavior During Therapy: WFL for tasks assessed/performed Overall Cognitive Status: Within Functional Limits for tasks assessed                                          Exercises Total Joint Exercises Ankle Circles/Pumps: AROM, Both, 15 reps, Supine Quad Sets: AROM, Both, 10 reps, Supine Heel Slides: AAROM, Right, 20 reps, Supine Hip ABduction/ADduction: AAROM, Right, 15 reps, Supine    General Comments        Pertinent Vitals/Pain Pain Assessment Pain Assessment: 0-10 Pain Score: 4  Pain Location: R hip Pain Descriptors / Indicators: Aching, Sore Pain Intervention(s): Limited activity within patient's tolerance, Premedicated before session, Monitored during session, Ice applied    Home Living                          Prior Function  PT Goals (current goals can now be found in the care plan section) Acute Rehab PT Goals Patient Stated Goal: Regain IND PT Goal Formulation: With patient Time For Goal Achievement: 02/25/23 Potential to Achieve Goals: Good Progress towards PT goals: Progressing toward goals    Frequency    7X/week      PT Plan      Co-evaluation              AM-PAC PT "6 Clicks" Mobility   Outcome Measure  Help needed turning from your back to your side while in a flat bed without using bedrails?: A Lot Help needed moving from lying on your back to sitting on the side of a flat bed without using bedrails?: A Lot Help needed moving to and from a bed to a chair (including a wheelchair)?: A  Lot Help needed standing up from a chair using your arms (e.g., wheelchair or bedside chair)?: A Little Help needed to walk in hospital room?: A Lot Help needed climbing 3-5 steps with a railing? : A Lot 6 Click Score: 13    End of Session Equipment Utilized During Treatment: Gait belt Activity Tolerance: Patient tolerated treatment well Patient left: in chair;with call bell/phone within reach;with chair alarm set Nurse Communication: Mobility status PT Visit Diagnosis: Unsteadiness on feet (R26.81);Muscle weakness (generalized) (M62.81);Difficulty in walking, not elsewhere classified (R26.2);Pain Pain - Right/Left: Right Pain - part of body: Hip     Time: 1410-1434 PT Time Calculation (min) (ACUTE ONLY): 24 min  Charges:    $Gait Training: 23-37 mins $Therapeutic Exercise: 8-22 mins PT General Charges $$ ACUTE PT VISIT: 1 Visit                     Mauro Kaufmann PT Acute Rehabilitation Services Pager [phone] Office [phone]    [facility] 02/19/2023, 2:37 PM

## 2023-02-20 LAB — CBC
HCT: 27.9 % — ABNORMAL LOW (ref 36.0–46.0)
Hemoglobin: 9.3 g/dL — ABNORMAL LOW (ref 12.0–15.0)
MCH: 28.8 pg (ref 26.0–34.0)
MCHC: 33.3 g/dL (ref 30.0–36.0)
MCV: 86.4 fL (ref 80.0–100.0)
Platelets: 217 10*3/uL (ref 150–400)
RBC: 3.23 MIL/uL — ABNORMAL LOW (ref 3.87–5.11)
RDW: 13.1 % (ref 11.5–15.5)
WBC: 14.7 10*3/uL — ABNORMAL HIGH (ref 4.0–10.5)
nRBC: 0 % (ref 0.0–0.2)

## 2023-02-20 NOTE — Progress Notes (Signed)
Patient ID: Deanna [name], female   DOB: 1945/07/09, 77 y.o.   MRN: [id number] Subjective: 2 Days Post-Op Procedure(s) (LRB): TOTAL HIP ARTHROPLASTY ANTERIOR APPROACH (Right)    Patient reports pain as moderate. No events overnight .  Still not feeling great about going home.  States she has 17 steps at home and does not feel as though she could navigate that.  Objective:   VITALS:   Vitals:   02/20/23 0348 02/20/23 0836  BP: 126/64 (!) 122/59  Pulse: 88 75  Resp: 17   Temp: (!) 97.5 F (36.4 C)   SpO2: 92%     Neurovascular intact Incision: dressing C/D/I  LABS Recent Labs    02/19/23 0345 02/20/23 0356  HGB 9.9* 9.3*  HCT 30.0* 27.9*  WBC 11.3* 14.7*  PLT 211 217    Recent Labs    02/19/23 0345  NA 134*  K 3.9  BUN 17  CREATININE 0.58  GLUCOSE 126*    No results for input(s): "LABPT", "INR" in the last 72 hours.   Assessment/Plan: 2 Days Post-Op Procedure(s) (LRB): TOTAL HIP ARTHROPLASTY ANTERIOR APPROACH (Right)   Advance diet Up with therapy  Patient continues to be on board with discharge to home.  However does feel she would benefit from more inpatient therapy here in the hospitalization before getting back to her house.  She did make some progress with therapy yesterday but did not have an opportunity to work on stairs.  We will see how she progresses today with potential discharge this evening versus tomorrow.

## 2023-02-20 NOTE — Progress Notes (Signed)
Physical Therapy Treatment Patient Details Name: Deanna [name] MRN: [id number] DOB: 02/21/1946 Today's Date: 02/20/2023   History of Present Illness Pt s/p R THR and with hx of L THR (22), osteoporosis, chronic pain syndrome, spinal stenosis and fusion, LBBB and very recent dx of a-fib    PT Comments  Pt continues to progress steadily with mobility and reports much more confidence with ability to transition home.  Pt hopeful for dc home tomorrow.    If plan is discharge home, recommend the following: Assist for transportation;Help with stairs or ramp for entrance;A little help with walking and/or transfers;A little help with bathing/dressing/bathroom   Can travel by private vehicle        Equipment Recommendations  None recommended by PT    Recommendations for Other Services       Precautions / Restrictions Precautions Precautions: Fall Restrictions Weight Bearing Restrictions: No RLE Weight Bearing: Weight bearing as tolerated Other Position/Activity Restrictions: WBAT     Mobility  Bed Mobility               General bed mobility comments: Pt up in chair and requests back to same    Transfers Overall transfer level: Needs assistance Equipment used: Rolling walker (2 wheels) Transfers: Sit to/from Stand Sit to Stand: Min assist, Contact guard assist           General transfer comment: Steady assist with cues for LE management and use of UEs to self assist    Ambulation/Gait Ambulation/Gait assistance: Min assist, Contact guard assist Gait Distance (Feet): 100 Feet (and 30') Assistive device: Rolling walker (2 wheels) Gait Pattern/deviations: Decreased step length - right, Decreased step length - left, Shuffle, Trunk flexed, Step-to pattern, Step-through pattern Gait velocity: decr     General Gait Details: Increased time with cues for sequence, posture, position from RW   Stairs Stairs: Yes Stairs assistance: Min assist Stair Management: Two  rails, Step to pattern, Forwards Number of Stairs: 5 General stair comments: 2+3 stairs with cues for sequence   Wheelchair Mobility     Tilt Bed    Modified Rankin (Stroke Patients Only)       Balance Overall balance assessment: Needs assistance Sitting-balance support: No upper extremity supported, Feet supported Sitting balance-Leahy Scale: Good     Standing balance support: Single extremity supported Standing balance-Leahy Scale: Poor                              Cognition Arousal: Alert Behavior During Therapy: WFL for tasks assessed/performed Overall Cognitive Status: Within Functional Limits for tasks assessed                                          Exercises Total Joint Exercises Ankle Circles/Pumps: AROM, Both, 15 reps, Supine Quad Sets: AROM, Both, 10 reps, Supine Heel Slides: AAROM, Right, 20 reps, Supine Hip ABduction/ADduction: AAROM, Right, 15 reps, Supine Long Arc Quad: AAROM, Right, 10 reps, Seated    General Comments        Pertinent Vitals/Pain Pain Assessment Pain Assessment: 0-10 Pain Score: 4  Pain Location: R hip Pain Descriptors / Indicators: Aching, Sore Pain Intervention(s): Limited activity within patient's tolerance, Monitored during session, Premedicated before session, Ice applied    Home Living  Prior Function            PT Goals (current goals can now be found in the care plan section) Acute Rehab PT Goals Patient Stated Goal: Regain IND PT Goal Formulation: With patient Time For Goal Achievement: 02/25/23 Potential to Achieve Goals: Good Progress towards PT goals: Progressing toward goals    Frequency    7X/week      PT Plan      Co-evaluation              AM-PAC PT "6 Clicks" Mobility   Outcome Measure  Help needed turning from your back to your side while in a flat bed without using bedrails?: A Lot Help needed moving from lying on  your back to sitting on the side of a flat bed without using bedrails?: A Lot Help needed moving to and from a bed to a chair (including a wheelchair)?: A Lot Help needed standing up from a chair using your arms (e.g., wheelchair or bedside chair)?: A Little Help needed to walk in hospital room?: A Little Help needed climbing 3-5 steps with a railing? : A Little 6 Click Score: 15    End of Session Equipment Utilized During Treatment: Gait belt Activity Tolerance: Patient tolerated treatment well Patient left: in chair;with call bell/phone within reach;with chair alarm set Nurse Communication: Mobility status PT Visit Diagnosis: Unsteadiness on feet (R26.81);Muscle weakness (generalized) (M62.81);Difficulty in walking, not elsewhere classified (R26.2);Pain Pain - Right/Left: Right Pain - part of body: Hip     Time: 5366-4403 PT Time Calculation (min) (ACUTE ONLY): 23 min  Charges:    $Gait Training: 8-22 mins $Therapeutic Exercise: 8-22 mins $Therapeutic Activity: 8-22 mins PT General Charges $$ ACUTE PT VISIT: 1 Visit                     Mauro Kaufmann PT Acute Rehabilitation Services Pager [phone] Office [phone]    [facility] 02/20/2023, 4:41 PM

## 2023-02-20 NOTE — Progress Notes (Signed)
Physical Therapy Treatment Patient Details Name: Deanna [name] Hreha MRN: [id number] DOB: [birth year] Today's Date: 02/20/2023   History of Present Illness Pt s/p R THR and with hx of L THR (22), osteoporosis, chronic pain syndrome, spinal stenosis and fusion, LBBB and very recent dx of a-fib    PT Comments  Pt in good spirits and with noted improvement in mobility and activity tolerance.  Pt hopeful to progress to dc home tomorrow but concerned regarding stairs.    If plan is discharge home, recommend the following: Assist for transportation;Help with stairs or ramp for entrance;A little help with walking and/or transfers;A little help with bathing/dressing/bathroom   Can travel by private vehicle        Equipment Recommendations  None recommended by PT    Recommendations for Other Services       Precautions / Restrictions Precautions Precautions: Fall Restrictions Weight Bearing Restrictions: No RLE Weight Bearing: Weight bearing as tolerated Other Position/Activity Restrictions: WBAT     Mobility  Bed Mobility               General bed mobility comments: Pt up in chair and requests back to same    Transfers Overall transfer level: Needs assistance Equipment used: Rolling walker (2 wheels) Transfers: Sit to/from Stand Sit to Stand: Min assist           General transfer comment: cues for LE management and use of UEs to self assist; physical assist to bring wt up and fwd and to balance in standing with RW.    Ambulation/Gait Ambulation/Gait assistance: Min assist Gait Distance (Feet): 100 Feet Assistive device: Rolling walker (2 wheels) Gait Pattern/deviations: Decreased step length - right, Decreased step length - left, Shuffle, Trunk flexed, Step-to pattern, Step-through pattern Gait velocity: decr     General Gait Details: Increased time with cues for sequence, posture, position from [employer]             Wheelchair Mobility     Tilt Bed     Modified Rankin (Stroke Patients Only)       Balance Overall balance assessment: Needs assistance Sitting-balance support: No upper extremity supported, Feet supported Sitting balance-Leahy Scale: Good     Standing balance support: Single extremity supported Standing balance-Leahy Scale: Poor                              Cognition Arousal: Alert Behavior During Therapy: WFL for tasks assessed/performed Overall Cognitive Status: Within Functional Limits for tasks assessed                                          Exercises Total Joint Exercises Ankle Circles/Pumps: AROM, Both, 15 reps, Supine Quad Sets: AROM, Both, 10 reps, Supine Heel Slides: AAROM, Right, 20 reps, Supine Hip ABduction/ADduction: AAROM, Right, 15 reps, Supine Long Arc Quad: AAROM, Right, 10 reps, Seated    General Comments        Pertinent Vitals/Pain Pain Assessment Pain Assessment: 0-10 Pain Score: 4  Pain Location: R hip Pain Descriptors / Indicators: Aching, Sore Pain Intervention(s): Limited activity within patient's tolerance, Monitored during session, Premedicated before session, Ice applied    Home Living                          Prior  Function            PT Goals (current goals can now be found in the care plan section) Acute Rehab PT Goals Patient Stated Goal: Regain IND PT Goal Formulation: With patient Time For Goal Achievement: 02/25/23 Potential to Achieve Goals: Good Progress towards PT goals: Progressing toward goals    Frequency    7X/week      PT Plan      Co-evaluation              AM-PAC PT "6 Clicks" Mobility   Outcome Measure  Help needed turning from your back to your side while in a flat bed without using bedrails?: A Lot Help needed moving from lying on your back to sitting on the side of a flat bed without using bedrails?: A Lot Help needed moving to and from a bed to a chair (including a wheelchair)?: A  Lot Help needed standing up from a chair using your arms (e.g., wheelchair or bedside chair)?: A Little Help needed to walk in hospital room?: A Little Help needed climbing 3-5 steps with a railing? : A Lot 6 Click Score: 14    End of Session Equipment Utilized During Treatment: Gait belt Activity Tolerance: Patient tolerated treatment well Patient left: in chair;with call bell/phone within reach;with chair alarm set Nurse Communication: Mobility status PT Visit Diagnosis: Unsteadiness on feet (R26.81);Muscle weakness (generalized) (M62.81);Difficulty in walking, not elsewhere classified (R26.2);Pain Pain - Right/Left: Right Pain - part of body: Hip     Time: 1110-1140 PT Time Calculation (min) (ACUTE ONLY): 30 min  Charges:    $Gait Training: 8-22 mins $Therapeutic Exercise: 8-22 mins PT General Charges $$ ACUTE PT VISIT: 1 Visit                     Mauro Kaufmann PT Acute Rehabilitation Services Pager [phone] Office [phone]    Jahron Hunsinger 02/20/2023, 1:12 PM

## 2023-02-21 NOTE — Progress Notes (Signed)
Physical Therapy Treatment Patient Details Name: Deanna [name] MRN: [id number] DOB: [birth year] Today's Date: 02/21/2023   History of Present Illness Pt s/p R THR and with hx of L THR (22), osteoporosis, chronic pain syndrome, spinal stenosis and fusion, LBBB and very recent dx of a-fib    PT Comments  Pt continues steady progress and hopeful for dc home this date.  Pt performed HEP with assist and written instruction provided and reviewed.  Pt reviewed bed mobility utilizing gait belt to self assist and reviewed car transfers.      If plan is discharge home, recommend the following: Assist for transportation;Help with stairs or ramp for entrance;A little help with walking and/or transfers;A little help with bathing/dressing/bathroom   Can travel by private vehicle        Equipment Recommendations  None recommended by PT    Recommendations for Other Services       Precautions / Restrictions Precautions Precautions: Fall Restrictions Weight Bearing Restrictions: No RLE Weight Bearing: Weight bearing as tolerated Other Position/Activity Restrictions: WBAT     Mobility  Bed Mobility Overal bed mobility: Needs Assistance Bed Mobility: Supine to Sit, Sit to Supine     Supine to sit: Supervision, Contact guard Sit to supine: Supervision, Contact guard assist   General bed mobility comments: Increased time with cues for sequence, use of L  LE to self assist and use of gait belt to assist R LE.  Pt sit to supine x 2 and supine to sit x 1    Transfers Overall transfer level: Needs assistance Equipment used: Rolling walker (2 wheels) Transfers: Sit to/from Stand, Bed to chair/wheelchair/BSC Sit to Stand: Supervision           General transfer comment: min cues for use of UEs to self assist; step pvt with RW recliner to bedside    Ambulation/Gait Ambulation/Gait assistance: Contact guard assist, Supervision Gait Distance (Feet): 100 Feet Assistive device: Rolling  walker (2 wheels) Gait Pattern/deviations: Decreased step length - right, Decreased step length - left, Shuffle, Trunk flexed, Step-to pattern, Step-through pattern Gait velocity: decr     General Gait Details: Increased time with min cues for sequence, posture, position from RW   Stairs Stairs: Yes Stairs assistance: Min assist Stair Management: Two rails, Step to pattern, Forwards Number of Stairs: 7 General stair comments: 3+4 stairs with cues for sequence   Wheelchair Mobility     Tilt Bed    Modified Rankin (Stroke Patients Only)       Balance Overall balance assessment: Needs assistance Sitting-balance support: No upper extremity supported, Feet supported Sitting balance-Leahy Scale: Good     Standing balance support: No upper extremity supported Standing balance-Leahy Scale: Fair                              Cognition Arousal: Alert Behavior During Therapy: WFL for tasks assessed/performed Overall Cognitive Status: Within Functional Limits for tasks assessed                                          Exercises Total Joint Exercises Ankle Circles/Pumps: AROM, Both, 15 reps, Supine Quad Sets: AROM, Both, 10 reps, Supine Heel Slides: AAROM, Right, 20 reps, Supine Hip ABduction/ADduction: AAROM, Right, 15 reps, Supine Long Arc Quad: AAROM, Right, 10 reps, Seated    General Comments  Pertinent Vitals/Pain Pain Assessment Pain Assessment: 0-10 Pain Score: 4  Pain Location: R hip Pain Descriptors / Indicators: Aching, Sore Pain Intervention(s): Limited activity within patient's tolerance    Home Living                          Prior Function            PT Goals (current goals can now be found in the care plan section) Acute Rehab PT Goals Patient Stated Goal: Regain IND PT Goal Formulation: With patient Time For Goal Achievement: 02/25/23 Potential to Achieve Goals: Good Progress towards PT goals:  Progressing toward goals    Frequency    7X/week      PT Plan      Co-evaluation              AM-PAC PT "6 Clicks" Mobility   Outcome Measure  Help needed turning from your back to your side while in a flat bed without using bedrails?: A Little Help needed moving from lying on your back to sitting on the side of a flat bed without using bedrails?: A Little Help needed moving to and from a bed to a chair (including a wheelchair)?: A Little Help needed standing up from a chair using your arms (e.g., wheelchair or bedside chair)?: A Little Help needed to walk in hospital room?: A Little Help needed climbing 3-5 steps with a railing? : A Little 6 Click Score: 18    End of Session Equipment Utilized During Treatment: Gait belt Activity Tolerance: Patient tolerated treatment well Patient left: with call bell/phone within reach;in bed;with nursing/sitter in room Nurse Communication: Mobility status PT Visit Diagnosis: Unsteadiness on feet (R26.81);Muscle weakness (generalized) (M62.81);Difficulty in walking, not elsewhere classified (R26.2);Pain Pain - Right/Left: Right Pain - part of body: Hip     Time: 1206-1243 PT Time Calculation (min) (ACUTE ONLY): 37 min  Charges:    $Gait Training: 8-22 mins $Therapeutic Exercise: 8-22 mins $Therapeutic Activity: 8-22 mins PT General Charges $$ ACUTE PT VISIT: 1 Visit                     Mauro Kaufmann PT Acute Rehabilitation Services Pager [phone] Office [phone]    Fabiola Mudgett 02/21/2023, 1:08 PM

## 2023-02-21 NOTE — Progress Notes (Signed)
Physical Therapy Treatment Patient Details Name: Deanna [name] MRN: [id number] DOB: 05/27/1946 Today's Date: 02/21/2023   History of Present Illness Pt s/p R THR and with hx of L THR (23), osteoporosis, chronic pain syndrome, spinal stenosis and fusion, LBBB and very recent dx of a-fib    PT Comments  Pt continues motivated and progressing with mobility including up to ambulate in hall and negotiating stairs.  Pt states much more confident with ability to transition home.  Therex and bed mobility deferred to later with pt need for bathroom.   If plan is discharge home, recommend the following: Assist for transportation;Help with stairs or ramp for entrance;A little help with walking and/or transfers;A little help with bathing/dressing/bathroom   Can travel by private vehicle        Equipment Recommendations  None recommended by PT    Recommendations for Other Services       Precautions / Restrictions Precautions Precautions: Fall Restrictions Weight Bearing Restrictions: No RLE Weight Bearing: Weight bearing as tolerated Other Position/Activity Restrictions: WBAT     Mobility  Bed Mobility               General bed mobility comments: Pt up in chair and requests back to same    Transfers Overall transfer level: Needs assistance Equipment used: Rolling walker (2 wheels) Transfers: Sit to/from Stand Sit to Stand: Contact guard assist, Supervision           General transfer comment: min cues for use of UEs to self assist    Ambulation/Gait Ambulation/Gait assistance: Contact guard assist, Supervision Gait Distance (Feet): 100 Feet Assistive device: Rolling walker (2 wheels) Gait Pattern/deviations: Decreased step length - right, Decreased step length - left, Shuffle, Trunk flexed, Step-to pattern, Step-through pattern Gait velocity: decr     General Gait Details: Increased time with min cues for sequence, posture, position from RW   Stairs Stairs:  Yes Stairs assistance: Min assist Stair Management: Two rails, Step to pattern, Forwards Number of Stairs: 7 General stair comments: 3+4 stairs with cues for sequence   Wheelchair Mobility     Tilt Bed    Modified Rankin (Stroke Patients Only)       Balance Overall balance assessment: Needs assistance Sitting-balance support: No upper extremity supported, Feet supported Sitting balance-Leahy Scale: Good     Standing balance support: No upper extremity supported Standing balance-Leahy Scale: Fair                              Cognition Arousal: Alert Behavior During Therapy: WFL for tasks assessed/performed Overall Cognitive Status: Within Functional Limits for tasks assessed                                          Exercises      General Comments        Pertinent Vitals/Pain Pain Assessment Pain Assessment: 0-10 Pain Score: 4  Pain Location: R hip Pain Descriptors / Indicators: Aching, Sore Pain Intervention(s): Limited activity within patient's tolerance, Monitored during session, Premedicated before session, Ice applied    Home Living                          Prior Function            PT Goals (current goals can now  be found in the care plan section) Acute Rehab PT Goals Patient Stated Goal: Regain IND PT Goal Formulation: With patient Time For Goal Achievement: 02/25/23 Potential to Achieve Goals: Good Progress towards PT goals: Progressing toward goals    Frequency    7X/week      PT Plan      Co-evaluation              AM-PAC PT "6 Clicks" Mobility   Outcome Measure  Help needed turning from your back to your side while in a flat bed without using bedrails?: A Lot Help needed moving from lying on your back to sitting on the side of a flat bed without using bedrails?: A Lot Help needed moving to and from a bed to a chair (including a wheelchair)?: A Little Help needed standing up from a  chair using your arms (e.g., wheelchair or bedside chair)?: A Little Help needed to walk in hospital room?: A Little Help needed climbing 3-5 steps with a railing? : A Little 6 Click Score: 16    End of Session Equipment Utilized During Treatment: Gait belt Activity Tolerance: Patient tolerated treatment well Patient left: in chair;with call bell/phone within reach;with chair alarm set Nurse Communication: Mobility status PT Visit Diagnosis: Unsteadiness on feet (R26.81);Muscle weakness (generalized) (M62.81);Difficulty in walking, not elsewhere classified (R26.2);Pain Pain - Right/Left: Right Pain - part of body: Hip     Time: 1043-1110 PT Time Calculation (min) (ACUTE ONLY): 27 min  Charges:    $Gait Training: 8-22 mins $Therapeutic Activity: 8-22 mins PT General Charges $$ ACUTE PT VISIT: 1 Visit                     Mauro Kaufmann PT Acute Rehabilitation Services Pager [phone] Office [phone]    Lynnsey Barbara 02/21/2023, 1:01 PM

## 2023-02-21 NOTE — Progress Notes (Signed)
Subjective: 3 Days Post-Op Procedure(s) (LRB): TOTAL HIP ARTHROPLASTY ANTERIOR APPROACH (Right) Patient reports pain as mild.   Plan is to go Home after hospital stay.  Objective: Vital signs in last 24 hours: Temp:  [97.6 F (36.4 C)-98.3 F (36.8 C)] 97.9 F (36.6 C) (09/22 0700) Pulse Rate:  [73-81] 76 (09/22 0700) Resp:  [16-20] 18 (09/22 0700) BP: (116-130)/(54-63) 128/60 (09/22 0700) SpO2:  [96 %-97 %] 97 % (09/22 0815)  Intake/Output from previous day:  Intake/Output Summary (Last 24 hours) at 02/21/2023 1248 Last data filed at 02/21/2023 0945 Gross per 24 hour  Intake 1063.82 ml  Output 350 ml  Net 713.82 ml    Intake/Output this shift: Total I/O In: -  Out: 350 [Urine:350]  Labs: Recent Labs    02/19/23 0345 02/20/23 0356  HGB 9.9* 9.3*   Recent Labs    02/19/23 0345 02/20/23 0356  WBC 11.3* 14.7*  RBC 3.42* 3.23*  HCT 30.0* 27.9*  PLT 211 217   Recent Labs    02/19/23 0345  NA 134*  K 3.9  CL 102  CO2 25  BUN 17  CREATININE 0.58  GLUCOSE 126*  CALCIUM 8.4*   No results for input(s): "LABPT", "INR" in the last 72 hours.  EXAM General - Patient is Alert, Appropriate, and Oriented Extremity - Neurologically intact Incision: dressing C/D/I No cellulitis present Motor Function - intact, moving foot and toes well on exam.   Past Medical History:  Diagnosis Date   Abnormal glucose 03/06/2020   ACS (acute coronary syndrome) (HCC)    Age-related osteoporosis without current pathological fracture 11/22/2015   Overview:  Femoral neck  Formatting of this note might be different from the original. Femoral neck   Allergic rhinitis due to pollen 08/19/2015   Allergy    Arthritis    Carpal tunnel syndrome, bilateral 02/28/2002   Overview:  H/O CTS   Celiac disease    Cervical disc disorder 05/10/2009   Chronic cough 02/15/2006   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 11/26/2015   Formatting of this note might be  different from the original. Added automatically from request for surgery [id number]   Chronic pain syndrome 04/01/2015   Colon polyps    Dyspnea    Essential hypertension 07/20/2000   Overview:  HBP   Family history of adverse reaction to anesthesia    mother delerium   Female cystocele 02/27/2009   GAD (generalized anxiety disorder) 08/19/2015   GERD (gastroesophageal reflux disease)    Heart murmur    History of Bell's palsy 03/21/2015   Hx of adenomatous colonic polyps 12/20/2008   Overview:  Colonoscopy 07/2003 - [facility] - polyps removed - recommended repeat in 3 years Colonoscopy 09/2011 - Dr. Darrol Jump - hyperplastic polyp(s) were removed - repeat in 5 years   Hypertension    Incomplete uterovaginal prolapse 03/04/2004   Insomnia    Left bundle-branch block 05/20/2006   Lumbar radiculopathy 01/09/2016   Formatting of this note might be different from the original. 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 Femoral neck   Other intervertebral disc degeneration, lumbar region 01/29/2015   Pre-operative cardiovascular examination 09/03/2020   Primary osteoarthritis, right hand 02/28/2002   Overview:  OA HANDS, ESP  THUMBS   Radial tunnel syndrome, right 11/04/2016   Radiculopathy, lumbar region 11/22/2014   Overview:  Added automatically from request for surgery [id number]   Rhinitis, allergic 02/24/2002   Overview:  ALLERGY SYMPTOMS - states was seen by allergist in past and informed allergic to dust   S/P left total hip arthroplasty 09/17/2020   Sacroiliitis (HCC) 08/02/2019   Screening for colon cancer 04/17/2005   Formatting of this note might be different from the original. Colonoscopy 2005   Sensorineural  hearing loss (SNHL) of both ears 07/05/2019   pt can hear normally   Short sleeper 04/29/2020   Snoring 04/29/2020   Spinal stenosis    Spinal stenosis, lumbar region without neurogenic claudication 11/22/2014   Tinnitus aurium, bilateral 07/05/2019   Trigger thumb, unspecified thumb 02/28/2002   Formatting of this note might be different from the original. TRIGGER THUMBS - started spring 2003. Injected 02/28/02   Unspecified asthma, uncomplicated 12/25/2004    Assessment/Plan: 3 Days Post-Op Procedure(s) (LRB): TOTAL HIP ARTHROPLASTY ANTERIOR APPROACH (Right) Principal Problem:   S/P total right hip arthroplasty   Has met goals with PT and ready for discharge home   Ollen Gross 02/21/2023, 12:48 PM

## 2023-03-02 ENCOUNTER — Ambulatory Visit: Payer: MEDICARE | Admitting: Pulmonary Disease

## 2023-03-02 NOTE — Discharge Summary (Signed)
Patient ID: Deanna [name] MRN: [id number] DOB/AGE: 1945/11/27 77 y.o.  Admit date: 02/18/2023 Discharge date: 02/21/2023  Admission Diagnoses:  Right hip osteoarthritis  Discharge Diagnoses:  Principal Problem:   S/P total right hip arthroplasty   Past Medical History:  Diagnosis Date   Abnormal glucose 03/06/2020   ACS (acute coronary syndrome) (HCC)    Age-related osteoporosis without current pathological fracture 11/22/2015   Overview:  Femoral neck  Formatting of this note might be different from the original. 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 Added automatically from request for surgery [id number]   Chronic pain syndrome 04/01/2015   Colon polyps    Dyspnea    Essential hypertension 07/20/2000   Overview:  HBP   Family history of adverse reaction to anesthesia    mother delerium   Female cystocele 02/27/2009   GAD (generalized anxiety disorder) 08/19/2015   GERD (gastroesophageal reflux disease)    Heart murmur    History of Bell's palsy 03/21/2015   Hx of adenomatous colonic polyps 12/20/2008   Overview:  Colonoscopy 07/2003 - LMD - polyps removed - recommended repeat in 3 years Colonoscopy 09/2011 - Dr. Darrol Jump - hyperplastic polyp(s) were removed - repeat in 5 years   Hypertension    Incomplete uterovaginal prolapse 03/04/2004   Insomnia    Left bundle-branch block 05/20/2006   Lumbar radiculopathy 01/09/2016   Formatting of this note might be different from the original. 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 Femoral neck   Other intervertebral disc degeneration, lumbar region 01/29/2015   Pre-operative cardiovascular examination 09/03/2020   Primary osteoarthritis, right hand 02/28/2002   Overview:  OA HANDS, ESP THUMBS   Radial tunnel syndrome, right 11/04/2016   Radiculopathy, lumbar region 11/22/2014   Overview:  Added automatically from request for surgery [id number]   Rhinitis, allergic 02/24/2002   Overview:  ALLERGY SYMPTOMS - states was seen by allergist in past and informed allergic to dust   S/P left total hip arthroplasty 09/17/2020   Sacroiliitis (HCC) 08/02/2019   Screening for colon cancer 04/17/2005   Formatting of this note might be different from the original. 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 TRIGGER THUMBS - started spring 2003. Injected 02/28/02   Unspecified asthma, uncomplicated 12/25/2004    Surgeries: Procedure(s): TOTAL HIP ARTHROPLASTY ANTERIOR APPROACH on 02/18/2023   Consultants:   Discharged Condition: Improved  Hospital Course: Deanna [name] is an 77 y.o. female who was admitted 02/18/2023 for operative treatment ofS/P total right hip arthroplasty. Patient has severe unremitting  Patient ID: Deanna [name] MRN: [id number] DOB/AGE: 1945/11/27 77 y.o.  Admit date: 02/18/2023 Discharge date: 02/21/2023  Admission Diagnoses:  Right hip osteoarthritis  Discharge Diagnoses:  Principal Problem:   S/P total right hip arthroplasty   Past Medical History:  Diagnosis Date   Abnormal glucose 03/06/2020   ACS (acute coronary syndrome) (HCC)    Age-related osteoporosis without current pathological fracture 11/22/2015   Overview:  Femoral neck  Formatting of this note might be different from the original. 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 Added automatically from request for surgery [id number]   Chronic pain syndrome 04/01/2015   Colon polyps    Dyspnea    Essential hypertension 07/20/2000   Overview:  HBP   Family history of adverse reaction to anesthesia    mother delerium   Female cystocele 02/27/2009   GAD (generalized anxiety disorder) 08/19/2015   GERD (gastroesophageal reflux disease)    Heart murmur    History of Bell's palsy 03/21/2015   Hx of adenomatous colonic polyps 12/20/2008   Overview:  Colonoscopy 07/2003 - LMD - polyps removed - recommended repeat in 3 years Colonoscopy 09/2011 - Dr. Darrol Jump - hyperplastic polyp(s) were removed - repeat in 5 years   Hypertension    Incomplete uterovaginal prolapse 03/04/2004   Insomnia    Left bundle-branch block 05/20/2006   Lumbar radiculopathy 01/09/2016   Formatting of this note might be different from the original. 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 Femoral neck   Other intervertebral disc degeneration, lumbar region 01/29/2015   Pre-operative cardiovascular examination 09/03/2020   Primary osteoarthritis, right hand 02/28/2002   Overview:  OA HANDS, ESP THUMBS   Radial tunnel syndrome, right 11/04/2016   Radiculopathy, lumbar region 11/22/2014   Overview:  Added automatically from request for surgery [id number]   Rhinitis, allergic 02/24/2002   Overview:  ALLERGY SYMPTOMS - states was seen by allergist in past and informed allergic to dust   S/P left total hip arthroplasty 09/17/2020   Sacroiliitis (HCC) 08/02/2019   Screening for colon cancer 04/17/2005   Formatting of this note might be different from the original. 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 TRIGGER THUMBS - started spring 2003. Injected 02/28/02   Unspecified asthma, uncomplicated 12/25/2004    Surgeries: Procedure(s): TOTAL HIP ARTHROPLASTY ANTERIOR APPROACH on 02/18/2023   Consultants:   Discharged Condition: Improved  Hospital Course: Deanna [name] is an 77 y.o. female who was admitted 02/18/2023 for operative treatment ofS/P total right hip arthroplasty. Patient has severe unremitting  myocardial SPECT imaging was performed after resting intravenous injection of 10.9 mCi Tc-65m tetrofosmin. Subsequently, intravenous infusion of Lexiscan was performed under the supervision of the Cardiology staff. At peak effect of the drug, 32.7 mCi Tc-1m tetrofosmin was injected intravenously and standard myocardial SPECT imaging was performed. Quantitative gated imaging was also performed to evaluate left ventricular wall motion, and estimate left ventricular ejection fraction. COMPARISON:  None Available. FINDINGS: Perfusion: No decreased activity in the left ventricle on stress imaging to suggest reversible ischemia or infarction. Wall Motion: Normal left ventricular wall motion. Mild diastolic dilatation. Left Ventricular Ejection Fraction: 56 % End diastolic volume 112 ml End systolic volume 49 ml IMPRESSION: 1. No reversible ischemia or infarction. 2. Normal left ventricular wall motion.  Mild diastolic dilatation. 3. Left ventricular ejection fraction 56% 4. Non invasive risk stratification*: Low *2012 Appropriate Use Criteria for Coronary Revascularization Focused Update: J Am Coll Cardiol. 2012;59(9):857-881.  http://content.[email or web address]?articleid=[email or web address] Electronically Signed   By: Signa Kell M.D.   On: 02/10/2023 14:23   DG Chest Port 1 View  Result Date: 02/09/2023 CLINICAL DATA:  Dyspnea EXAM: PORTABLE CHEST 1 VIEW COMPARISON:  12/12/2022 FINDINGS: Lungs are clear save for minimal left basilar atelectasis or linear scarring. No pneumothorax or pleural effusion. Cardiac size within normal limits. Pulmonary vascularity is normal. No acute bone abnormality. IMPRESSION: No active disease. Electronically Signed   By: Helyn Numbers M.D.   On: 02/09/2023 20:03   ECHOCARDIOGRAM COMPLETE  Result Date: 02/02/2023    ECHOCARDIOGRAM REPORT   Patient Name:   Deanna [name] Date of Exam: 02/02/2023 Medical Rec #:  [id number]       Height:       61.0 in Accession #:    [id number]      Weight:       159.4 lb Date of Birth:  1945-06-18       BSA:          1.715 m Patient Age:    76 years        BP:           140/62 mmHg Patient Gender: F               HR:           85 bpm. Exam Location:  [facility] Procedure: 2D Echo, Cardiac Doppler and Color Doppler Indications:     Dyspnea R06.00  History:         Patient has prior history of Echocardiogram examinations, most                  recent 09/09/2020. Risk Factors:Hypertension. LVH.  Sonographer:     Cristela Blue Referring Phys:  [id number] Janann Colonel Diagnosing Phys: Yvonne Kendall MD IMPRESSIONS  1. Left ventricular ejection fraction, by estimation, is 60 to 65%. The left ventricle has normal function. The left ventricle has no regional wall motion abnormalities. There is severe asymmetric left ventricular hypertrophy of the septal segment. Left  ventricular diastolic parameters are consistent with Grade I diastolic dysfunction (impaired relaxation).  2. Right ventricular systolic function is normal. The right ventricular size is normal.  3. The mitral valve is normal in structure. Mild to moderate mitral valve regurgitation. No evidence of mitral stenosis.  4.  The aortic valve is tricuspid. There is mild calcification of the aortic valve. There is mild thickening of the aortic valve. Aortic valve regurgitation is not visualized. Aortic valve sclerosis/calcification is present, without any evidence of aortic stenosis. Conclusion(s)/Recommendation(s): Hypertrophic cardiomyopathy  Patient ID: Deanna [name] MRN: [id number] DOB/AGE: 1945/11/27 77 y.o.  Admit date: 02/18/2023 Discharge date: 02/21/2023  Admission Diagnoses:  Right hip osteoarthritis  Discharge Diagnoses:  Principal Problem:   S/P total right hip arthroplasty   Past Medical History:  Diagnosis Date   Abnormal glucose 03/06/2020   ACS (acute coronary syndrome) (HCC)    Age-related osteoporosis without current pathological fracture 11/22/2015   Overview:  Femoral neck  Formatting of this note might be different from the original. 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 Added automatically from request for surgery [id number]   Chronic pain syndrome 04/01/2015   Colon polyps    Dyspnea    Essential hypertension 07/20/2000   Overview:  HBP   Family history of adverse reaction to anesthesia    mother delerium   Female cystocele 02/27/2009   GAD (generalized anxiety disorder) 08/19/2015   GERD (gastroesophageal reflux disease)    Heart murmur    History of Bell's palsy 03/21/2015   Hx of adenomatous colonic polyps 12/20/2008   Overview:  Colonoscopy 07/2003 - LMD - polyps removed - recommended repeat in 3 years Colonoscopy 09/2011 - Dr. Darrol Jump - hyperplastic polyp(s) were removed - repeat in 5 years   Hypertension    Incomplete uterovaginal prolapse 03/04/2004   Insomnia    Left bundle-branch block 05/20/2006   Lumbar radiculopathy 01/09/2016   Formatting of this note might be different from the original. 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 Femoral neck   Other intervertebral disc degeneration, lumbar region 01/29/2015   Pre-operative cardiovascular examination 09/03/2020   Primary osteoarthritis, right hand 02/28/2002   Overview:  OA HANDS, ESP THUMBS   Radial tunnel syndrome, right 11/04/2016   Radiculopathy, lumbar region 11/22/2014   Overview:  Added automatically from request for surgery [id number]   Rhinitis, allergic 02/24/2002   Overview:  ALLERGY SYMPTOMS - states was seen by allergist in past and informed allergic to dust   S/P left total hip arthroplasty 09/17/2020   Sacroiliitis (HCC) 08/02/2019   Screening for colon cancer 04/17/2005   Formatting of this note might be different from the original. 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 TRIGGER THUMBS - started spring 2003. Injected 02/28/02   Unspecified asthma, uncomplicated 12/25/2004    Surgeries: Procedure(s): TOTAL HIP ARTHROPLASTY ANTERIOR APPROACH on 02/18/2023   Consultants:   Discharged Condition: Improved  Hospital Course: Deanna [name] is an 77 y.o. female who was admitted 02/18/2023 for operative treatment ofS/P total right hip arthroplasty. Patient has severe unremitting  myocardial SPECT imaging was performed after resting intravenous injection of 10.9 mCi Tc-65m tetrofosmin. Subsequently, intravenous infusion of Lexiscan was performed under the supervision of the Cardiology staff. At peak effect of the drug, 32.7 mCi Tc-1m tetrofosmin was injected intravenously and standard myocardial SPECT imaging was performed. Quantitative gated imaging was also performed to evaluate left ventricular wall motion, and estimate left ventricular ejection fraction. COMPARISON:  None Available. FINDINGS: Perfusion: No decreased activity in the left ventricle on stress imaging to suggest reversible ischemia or infarction. Wall Motion: Normal left ventricular wall motion. Mild diastolic dilatation. Left Ventricular Ejection Fraction: 56 % End diastolic volume 112 ml End systolic volume 49 ml IMPRESSION: 1. No reversible ischemia or infarction. 2. Normal left ventricular wall motion.  Mild diastolic dilatation. 3. Left ventricular ejection fraction 56% 4. Non invasive risk stratification*: Low *2012 Appropriate Use Criteria for Coronary Revascularization Focused Update: J Am Coll Cardiol. 2012;59(9):857-881.  http://content.[email or web address]?[email or web address] Electronically Signed   By: Signa Kell M.D.   On: 02/10/2023 14:23   DG Chest Port 1 View  Result Date: 02/09/2023 CLINICAL DATA:  Dyspnea EXAM: PORTABLE CHEST 1 VIEW COMPARISON:  12/12/2022 FINDINGS: Lungs are clear save for minimal left basilar atelectasis or linear scarring. No pneumothorax or pleural effusion. Cardiac size within normal limits. Pulmonary vascularity is normal. No acute bone abnormality. IMPRESSION: No active disease. Electronically Signed   By: Helyn Numbers M.D.   On: 02/09/2023 20:03   ECHOCARDIOGRAM COMPLETE  Result Date: 02/02/2023    ECHOCARDIOGRAM REPORT   Patient Name:   Deanna [name] Date of Exam: 02/02/2023 Medical Rec #:  [id number]       Height:       61.0 in Accession #:    [id number]      Weight:       159.4 lb Date of Birth:  1945-06-18       BSA:          1.715 m Patient Age:    76 years        BP:           140/62 mmHg Patient Gender: F               HR:           85 bpm. Exam Location:  [facility] Procedure: 2D Echo, Cardiac Doppler and Color Doppler Indications:     Dyspnea R06.00  History:         Patient has prior history of Echocardiogram examinations, most                  recent 09/09/2020. Risk Factors:Hypertension. LVH.  Sonographer:     Cristela Blue Referring Phys:  [id number] Janann Colonel Diagnosing Phys: Yvonne Kendall MD IMPRESSIONS  1. Left ventricular ejection fraction, by estimation, is 60 to 65%. The left ventricle has normal function. The left ventricle has no regional wall motion abnormalities. There is severe asymmetric left ventricular hypertrophy of the septal segment. Left  ventricular diastolic parameters are consistent with Grade I diastolic dysfunction (impaired relaxation).  2. Right ventricular systolic function is normal. The right ventricular size is normal.  3. The mitral valve is normal in structure. Mild to moderate mitral valve regurgitation. No evidence of mitral stenosis.  4.  The aortic valve is tricuspid. There is mild calcification of the aortic valve. There is mild thickening of the aortic valve. Aortic valve regurgitation is not visualized. Aortic valve sclerosis/calcification is present, without any evidence of aortic stenosis. Conclusion(s)/Recommendation(s): Hypertrophic cardiomyopathy  myocardial SPECT imaging was performed after resting intravenous injection of 10.9 mCi Tc-65m tetrofosmin. Subsequently, intravenous infusion of Lexiscan was performed under the supervision of the Cardiology staff. At peak effect of the drug, 32.7 mCi Tc-1m tetrofosmin was injected intravenously and standard myocardial SPECT imaging was performed. Quantitative gated imaging was also performed to evaluate left ventricular wall motion, and estimate left ventricular ejection fraction. COMPARISON:  None Available. FINDINGS: Perfusion: No decreased activity in the left ventricle on stress imaging to suggest reversible ischemia or infarction. Wall Motion: Normal left ventricular wall motion. Mild diastolic dilatation. Left Ventricular Ejection Fraction: 56 % End diastolic volume 112 ml End systolic volume 49 ml IMPRESSION: 1. No reversible ischemia or infarction. 2. Normal left ventricular wall motion.  Mild diastolic dilatation. 3. Left ventricular ejection fraction 56% 4. Non invasive risk stratification*: Low *2012 Appropriate Use Criteria for Coronary Revascularization Focused Update: J Am Coll Cardiol. 2012;59(9):857-881.  http://content.[email or web address]?[email or web address] Electronically Signed   By: Signa Kell M.D.   On: 02/10/2023 14:23   DG Chest Port 1 View  Result Date: 02/09/2023 CLINICAL DATA:  Dyspnea EXAM: PORTABLE CHEST 1 VIEW COMPARISON:  12/12/2022 FINDINGS: Lungs are clear save for minimal left basilar atelectasis or linear scarring. No pneumothorax or pleural effusion. Cardiac size within normal limits. Pulmonary vascularity is normal. No acute bone abnormality. IMPRESSION: No active disease. Electronically Signed   By: Helyn Numbers M.D.   On: 02/09/2023 20:03   ECHOCARDIOGRAM COMPLETE  Result Date: 02/02/2023    ECHOCARDIOGRAM REPORT   Patient Name:   Deanna [name] Date of Exam: 02/02/2023 Medical Rec #:  [id number]       Height:       61.0 in Accession #:    [id number]      Weight:       159.4 lb Date of Birth:  1945-06-18       BSA:          1.715 m Patient Age:    76 years        BP:           140/62 mmHg Patient Gender: F               HR:           85 bpm. Exam Location:  [facility] Procedure: 2D Echo, Cardiac Doppler and Color Doppler Indications:     Dyspnea R06.00  History:         Patient has prior history of Echocardiogram examinations, most                  recent 09/09/2020. Risk Factors:Hypertension. LVH.  Sonographer:     Cristela Blue Referring Phys:  [id number] Janann Colonel Diagnosing Phys: Yvonne Kendall MD IMPRESSIONS  1. Left ventricular ejection fraction, by estimation, is 60 to 65%. The left ventricle has normal function. The left ventricle has no regional wall motion abnormalities. There is severe asymmetric left ventricular hypertrophy of the septal segment. Left  ventricular diastolic parameters are consistent with Grade I diastolic dysfunction (impaired relaxation).  2. Right ventricular systolic function is normal. The right ventricular size is normal.  3. The mitral valve is normal in structure. Mild to moderate mitral valve regurgitation. No evidence of mitral stenosis.  4.  The aortic valve is tricuspid. There is mild calcification of the aortic valve. There is mild thickening of the aortic valve. Aortic valve regurgitation is not visualized. Aortic valve sclerosis/calcification is present, without any evidence of aortic stenosis. Conclusion(s)/Recommendation(s): Hypertrophic cardiomyopathy

## 2023-03-09 DIAGNOSIS — M1611 Unilateral primary osteoarthritis, right hip: Secondary | ICD-10-CM

## 2023-05-20 ENCOUNTER — Telehealth: Payer: Self-pay | Admitting: Pulmonary Disease

## 2023-05-20 NOTE — Telephone Encounter (Signed)
Patient would like RX for rescue inhaler. Pharmacist recommended a rescue inhaler. Pharmacy [facility] [place]. Patient phone number is [phone].

## 2023-05-20 NOTE — Telephone Encounter (Signed)
Patient reports she spoke to the pharmacist with her insurance. And she was advised to ask about a rescue inhaler. She reports shortness of breath on exertion. She has been with out Ventolin since September. She reports using Incruse Ellipta daily. Please advise.

## 2023-05-21 MED ORDER — ALBUTEROL SULFATE HFA 108 (90 BASE) MCG/ACT IN AERS: 2.0000 | INHALATION_SPRAY | Freq: Four times a day (QID) | RESPIRATORY_TRACT | 2 refills | Status: DC | PRN

## 2023-05-21 NOTE — Addendum Note (Signed)
Addended by: Janann Colonel on: 05/21/2023 02:05 PM   Modules accepted: Orders

## 2023-05-24 NOTE — Telephone Encounter (Signed)
Left detailed message on voicemail advising as below. DPR checked. Nothing further needed.

## 2023-05-29 ENCOUNTER — Encounter (HOSPITAL_BASED_OUTPATIENT_CLINIC_OR_DEPARTMENT_OTHER): Payer: Self-pay | Admitting: Emergency Medicine

## 2023-05-29 ENCOUNTER — Emergency Department (HOSPITAL_BASED_OUTPATIENT_CLINIC_OR_DEPARTMENT_OTHER): Payer: MEDICARE

## 2023-05-29 ENCOUNTER — Other Ambulatory Visit: Payer: Self-pay

## 2023-05-29 ENCOUNTER — Emergency Department (HOSPITAL_BASED_OUTPATIENT_CLINIC_OR_DEPARTMENT_OTHER)
Admission: EM | Admit: 2023-05-29 | Discharge: 2023-05-29 | Disposition: A | Discharge status: Home / Self Care | Payer: MEDICARE

## 2023-05-29 DIAGNOSIS — S0990XA Unspecified injury of head, initial encounter: Secondary | ICD-10-CM | POA: Diagnosis not present

## 2023-05-29 DIAGNOSIS — Z7901 Long term (current) use of anticoagulants: Secondary | ICD-10-CM | POA: Insufficient documentation

## 2023-05-29 DIAGNOSIS — Y92512 Supermarket, store or market as the place of occurrence of the external cause: Secondary | ICD-10-CM | POA: Insufficient documentation

## 2023-05-29 DIAGNOSIS — W010XXA Fall on same level from slipping, tripping and stumbling without subsequent striking against object, initial encounter: Secondary | ICD-10-CM | POA: Diagnosis not present

## 2023-05-29 DIAGNOSIS — M25561 Pain in right knee: Secondary | ICD-10-CM | POA: Diagnosis present

## 2023-05-29 DIAGNOSIS — S82034A Nondisplaced transverse fracture of right patella, initial encounter for closed fracture: Secondary | ICD-10-CM | POA: Insufficient documentation

## 2023-05-29 DIAGNOSIS — Y9301 Activity, walking, marching and hiking: Secondary | ICD-10-CM | POA: Diagnosis not present

## 2023-05-29 HISTORY — DX: Unspecified atrial fibrillation: I48.91

## 2023-05-29 MED ORDER — OXYCODONE-ACETAMINOPHEN 5-325 MG PO TABS
1.0000 | ORAL_TABLET | Freq: Once | ORAL | Status: AC
  Administered 2023-05-29: 1 via ORAL
  Filled 2023-05-29: qty 1

## 2023-05-29 NOTE — ED Provider Notes (Signed)
77 year old female presenting to the emergency department after a fall.  She was found to have a nondisplaced patellar fracture and has been placed in a knee immobilizer.  The plan is to follow-up on her CT scan and to discharge with orthopedic follow-up if negative.  Physical Exam  BP (!) 130/57   Pulse 80   Temp (!) 97.5 F (36.4 C)   Resp 18   Wt 72.6 kg   SpO2 93%   BMI 29.26 kg/m   Physical Exam  Procedures  Procedures  ED Course / MDM   Clinical Course as of 05/29/23 1545  Sat May 29, 2023  1425 DG Knee Complete 4 Views Right IMPRESSION: 1. Nondisplaced fracture involving the right patella. 2. Soft tissue swelling and joint effusion. 3. Chronic changes along the superolateral aspect of the patella.   [TY]    Clinical Course User Index [TY] Coral Spikes, DO   Medical Decision Making Amount and/or Complexity of Data Reviewed Radiology: ordered. Decision-making details documented in ED Course.  Risk Prescription drug management.   The patient CT scan does not show any acute traumatic injuries.  She is discharged with return precautions.       Durwin Glaze, MD 05/29/23 1600

## 2023-05-29 NOTE — ED Provider Notes (Signed)
[place] EMERGENCY DEPARTMENT AT [facility] Provider Note   CSN: [id number] Arrival date & time: 05/29/23  1234     History  Chief Complaint  Patient presents with   fall onthinners    Deanna [name] is a 77 y.o. female.  77 year old female to the emergency department for right knee pain after fall.  Mechanical fall while walking at grocery store.  Reports he tripped over carpet.  Landed on knee and then down onto front.  Denies hitting her head, no LOC.  She is on Eliquis.  No headache, vision changes, no chest pain or shortness of breath no abdominal pain.  Denies numbness tingling changes in sensation in extremities.        Home Medications Prior to Admission medications   Medication Sig Start Date End Date Taking? Authorizing Provider  albuterol (VENTOLIN HFA) 108 (90 Base) MCG/ACT inhaler Inhale 1-2 puffs into the lungs every 6 (six) hours as needed for wheezing or shortness of breath. 12/12/22   Long, Arlyss Repress, MD  albuterol (VENTOLIN HFA) 108 (90 Base) MCG/ACT inhaler Inhale 2 puffs into the lungs every 6 (six) hours as needed for wheezing or shortness of breath. 05/21/23   Assaker, [place] B[place], MD  apixaban (ELIQUIS) 5 MG TABS tablet Take 1 tablet (5 mg total) by mouth 2 (two) times daily. Hold medication from 9/15 to 9/21 for right hip surgery 02/11/23   [name], MD  Ascorbic Acid (VITAMIN C) 1000 MG tablet Take 4,000 mg by mouth daily.    [provider]  Calcium-Magnesium (CAL-MAG PO) Take 2,000 mg by mouth at bedtime. 1000 mg    [provider]  Carboxymethylcellul-Glycerin (CLEAR EYES FOR DRY EYES OP) Place 1 drop into both eyes daily.    [provider]  [employer] MAGNESIUM OXIDE 250 MG TABS TAKE 1 TABLET (250 MG TOTAL) BY MOUTH EVERY EVENING. TAKE WITH EVENING MEAL Patient taking differently: Take 250 mg by mouth daily. 09/15/21   Arnette Felts, FNP  diltiazem (CARDIZEM SR) 120 MG 12 hr capsule Take 1 capsule (120 mg total) by  mouth every 12 (twelve) hours. 02/11/23   [name], MD  furosemide (LASIX) 20 MG tablet Take 20 mg by mouth as needed. 02/05/23   [provider]  hydrochlorothiazide (HYDRODIURIL) 25 MG tablet Take 1 tablet (25 mg total) by mouth 2 (two) times daily. Patient taking differently: Take 1 tablet by mouth daily. 10/07/20   Arnette Felts, FNP  HYDROcodone-acetaminophen (NORCO/VICODIN) 5-325 MG tablet Take 1-2 tablets by mouth every 4 (four) hours as needed for severe pain. 02/19/23   Cassandria Anger, PA-C  loratadine (CLARITIN) 10 MG tablet Take 10 mg by mouth daily as needed for allergies.    [provider]  losartan (COZAAR) 50 MG tablet TAKE 1 TABLET BY MOUTH EVERY DAY 01/14/23   Arnette Felts, FNP  methocarbamol (ROBAXIN) 500 MG tablet Take 1 tablet (500 mg total) by mouth every 6 (six) hours as needed for muscle spasms (muscle pain). 02/19/23   Cassandria Anger, PA-C  metoprolol tartrate (LOPRESSOR) 25 MG tablet Take 1 tablet (25 mg total) by mouth 2 (two) times daily. 02/11/23   [name], MD  milk thistle 175 MG tablet Take 175 mg by mouth daily.    [provider]  nitroGLYCERIN (NITROSTAT) 0.4 MG SL tablet Place 1 tablet (0.4 mg total) under the tongue every 5 (five) minutes x 3 doses as needed for chest pain. 02/11/23   [name], MD  polyethylene  glycol (MIRALAX / GLYCOLAX) 17 g packet Take 17 g by mouth 2 (two) times daily. 02/19/23   Cassandria Anger, PA-C  Probiotic Product (PROBIOTIC DAILY PO) Take 1 tablet by mouth daily.    [provider]  rosuvastatin (CRESTOR) 20 MG tablet Take 1 tablet (20 mg total) by mouth daily. 02/12/23   [name], MD  umeclidinium bromide (INCRUSE ELLIPTA) 62.5 MCG/ACT AEPB Inhale 1 puff into the lungs daily. 02/16/23   Janann Colonel, MD  Vitamin A 2400 MCG (8000 UT) CAPS Take 16,000 Units by mouth daily. 8000 units    [provider]  Vitamin D, Ergocalciferol, (DRISDOL) 1.25 MG (50000 UNIT) CAPS  capsule Take 50,000 Units by mouth once a week.    [provider]      Allergies    Codeine, Gluten meal, Dust mite extract, Gabapentin, Sulfa antibiotics, and Terbinafine hcl    Review of Systems   Review of Systems  Physical Exam Updated Vital Signs BP (!) 130/57   Pulse 80   Temp (!) 97.5 F (36.4 C)   Resp 18   Wt 72.6 kg   SpO2 93%   BMI 29.26 kg/m  Physical Exam Vitals and nursing note reviewed.  Constitutional:      General: She is not in acute distress.    Appearance: She is not toxic-appearing.  HENT:     Head: Normocephalic.     Nose: Nose normal.  Eyes:     Conjunctiva/sclera: Conjunctivae normal.  Cardiovascular:     Rate and Rhythm: Normal rate and regular rhythm.  Pulmonary:     Effort: Pulmonary effort is normal.     Breath sounds: Normal breath sounds.  Abdominal:     General: Abdomen is flat. There is no distension.     Tenderness: There is no abdominal tenderness. There is no guarding or rebound.  Musculoskeletal:     Comments: No midline spinal tenderness.  Chest wall stable nontender.  Pelvis stable nontender.  Does have some swelling to her right knee with tenderness over the patella.  Compartments are soft.  2+ DP pulses.  5 out of 5 plantarflexion dorsiflexion bilaterally.  No bony tenderness in upper extremities.  Good strength bilaterally.  Neurological:     General: No focal deficit present.     Mental Status: She is alert and oriented to [place], place, and time.  Psychiatric:        Mood and Affect: Mood normal.        Behavior: Behavior normal.     ED Results / Procedures / Treatments   Labs (all labs ordered are listed, but only abnormal results are displayed) Labs Reviewed - No data to display  EKG None  Radiology DG Knee Complete 4 Views Right Result Date: 05/29/2023 CLINICAL DATA:  Fall.  Right knee pain.  Patient is on Eliquis. EXAM: RIGHT KNEE - COMPLETE 4+ VIEW COMPARISON:  Right tibia and fibula 02/15/2021  FINDINGS: Nondisplaced fracture involving the right patella. Fracture extends anterior to posterior in the midportion of the patella. The fracture is best seen on the lateral view. Soft tissue swelling along the anterior aspect of the knee and there is likely a suprapatellar joint effusion. Right knee is located. Mild joint space narrowing the medial knee compartment. Evidence for chronic changes along the superolateral aspect of the patella which could be associated with enthesopathic changes and a bipartite patella. Focal cortical thickening in the right mid fibula is compatible with an old fracture. IMPRESSION: 1.  Nondisplaced fracture involving the right patella. 2. Soft tissue swelling and joint effusion. 3. Chronic changes along the superolateral aspect of the patella. Electronically Signed   By: Richarda Overlie M.D.   On: 05/29/2023 14:18    Procedures Procedures    Medications Ordered in ED Medications  oxyCODONE-acetaminophen (PERCOCET/ROXICET) 5-325 MG per tablet 1 tablet (1 tablet Oral Given 05/29/23 1453)    ED Course/ Medical Decision Making/ A&P Clinical Course as of 05/29/23 1520  Sat May 29, 2023  1425 DG Knee Complete 4 Views Right IMPRESSION: 1. Nondisplaced fracture involving the right patella. 2. Soft tissue swelling and joint effusion. 3. Chronic changes along the superolateral aspect of the patella.   [TY]    Clinical Course User Index [TY] Coral Spikes, DO                                 Medical Decision Making Is a 77 year old female to the emergency department after a fall with right knee pain.  Is afebrile nontachycardic hemodynamically stable.  Physical exam with some pain and swelling of her right knee, no other injuries identified on exam.  X-ray with patella fracture.  Placed in knee immobilizer given crutches.  Percocet for pain.  Given age and fall on thinners CT head ordered.  Final read pending.  However on my independent interpretation does not acute  traumatic pathology.  Patient stable for discharge pending negative radiology read of CT scan.  Follow-up with Ortho.  Amount and/or Complexity of Data Reviewed Radiology: ordered. Decision-making details documented in ED Course.  Risk Prescription drug management.          Final Clinical Impression(s) / ED Diagnoses Final diagnoses:  Closed nondisplaced transverse fracture of right patella, initial encounter    Rx / DC Orders ED Discharge Orders     None         Coral Spikes, DO 05/29/23 1520

## 2023-05-29 NOTE — ED Notes (Signed)
R knee swelling increasing, along with pain. Some deformity noted. Bilateral lower extremity pedal pulses intact, +2. Skin warm and dry. Sensation intact.

## 2023-05-29 NOTE — ED Triage Notes (Signed)
Pt fell /tripped while walking in grocery store. Right knee took most force. Right knee hurting. Pt is on eliquis, hip surgery on right in september

## 2023-05-29 NOTE — ED Notes (Signed)
Doctor in with patient

## 2023-05-29 NOTE — ED Notes (Signed)
Pt. Decided she did not want the walker and would get one at home with a seat.

## 2023-05-29 NOTE — Discharge Instructions (Signed)
Please call and schedule appointment with orthopedic doctor.  Remain in your knee immobilizer until you are seen by them. You may take over-the-counter Tylenol alternating with Motrin for pain.

## 2023-06-03 ENCOUNTER — Ambulatory Visit: Payer: MEDICARE | Admitting: Pulmonary Disease

## 2023-06-07 ENCOUNTER — Ambulatory Visit: Payer: MEDICARE | Admitting: Pulmonary Disease

## 2023-06-09 ENCOUNTER — Ambulatory Visit: Payer: MEDICARE | Admitting: Pulmonary Disease

## 2023-06-09 ENCOUNTER — Encounter: Payer: Self-pay | Admitting: Pulmonary Disease

## 2023-06-09 VITALS — BP 138/66 | HR 81 | Temp 97.7°F | Ht 62.0 in | Wt 166.0 lb

## 2023-06-09 DIAGNOSIS — R0602 Shortness of breath: Secondary | ICD-10-CM

## 2023-06-09 DIAGNOSIS — J449 Chronic obstructive pulmonary disease, unspecified: Secondary | ICD-10-CM

## 2023-06-09 LAB — NITRIC OXIDE: Nitric Oxide: 18

## 2023-06-09 MED ORDER — BUDESONIDE-FORMOTEROL FUMARATE 160-4.5 MCG/ACT IN AERO: 2.0000 | INHALATION_SPRAY | Freq: Two times a day (BID) | RESPIRATORY_TRACT | 12 refills | Status: DC

## 2023-06-12 NOTE — Progress Notes (Signed)
 Synopsis: Referred in by Delores Rojelio Caldron, NP   Subjective:   PATIENT ID: Deanna [name] GENDER: female DOB: 09-11-1945, MRN: [id number]  Chief Complaint  Patient presents with   Follow-up    Cough, shortness of breath and wheezing.     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 Terri at [facility], severe osteoarthritis of the right hip with question of infiltrative disease presenting to the pulmonary clinic today for exertional shortness of breath.  She reports that she started experiencing shortness of breath on exertion about 6 months ago.  It is mostly exertional and progressed to the point where she can barely walk from her bedroom to the bathroom without stopping.  She denies any chest tightness wheezing and she denies any cough or sputum production.  She denies any orthopnea or paroxysmal nocturnal dyspnea.  She also denies any allergic rhinitis or eczema.  She does not have any acid reflux symptoms.  She was prescribed albuterol  to be used as needed with partial response.  She went to the ED department at [facility] on July with shortness of breath and as part of the workup she underwent a CT angio chest which ruled out PE and failed to show any parenchymal lug disease. However it did show an age indeterminate T10 compression fracture.   PFTs 01/2023  with reduced FEV-1, reduced FVC and borderline FEV-1/FVC ratio. No signifcant response to BDL. Lung volumes w/ air trapping and mild decrease in DLCO. Overall consistent with COPD.   Echocardiogram 02/02/2023 with Nl LVEF - Grade I diastolic dysfunction, severe asymetric hypertrophy of the septal segment. No signifcant valvular disease.   Nuclear stress test 02/10/2023 without  any significant signs of ischemia.    Family history: No family history of lung disease   Social history:  Stopped smoking in 1999, smoked 1PPD for 30years. Denies alcohol  use, denies illicit drug use. Retired Corporate Treasurer for [employer]. No pets at home. Has 2 daughters that are heatlhy.   ROS All systems were reviewed and are negative except for the above.  Objective:   Vitals:   06/09/23 1612  BP: 138/66  Pulse: 81  Temp: 97.7 F (36.5 C)  TempSrc: Temporal  SpO2: 97%  Weight: 166 lb (75.3 kg)  Height: 5' 2 (1.575 m)   97% on RA BMI Readings from Last 3 Encounters:  06/09/23 30.36 kg/m  05/29/23 29.26 kg/m  02/18/23 28.59 kg/m   Wt Readings from Last 3 Encounters:  06/09/23 166 lb (75.3 kg)  05/29/23 160 lb (72.6 kg)  02/18/23 156 lb 4.9 oz (70.9 kg)    Physical Exam GEN: NAD, Healthy Appearing HEENT: Supple Neck, Reactive Pupils, EOMI  CVS: Normal S1, Normal S2, RRR, No murmurs or ES appreciated  Lungs: Clear bilateral air entry.  Abdomen: Soft, non tender, non distended, + BS  Extremities: Warm and well perfused, lower extremities edema at the ankles worse over the right side.  Skin: No suspicious lesions appreciated  Psych: Normal Affect, anxious.   Ancillary Information   CBC    Component Value Date/Time   WBC 14.7 (H) 02/20/2023 0356   RBC 3.23 (L) 02/20/2023 0356   HGB 9.3 (L) 02/20/2023 0356   HGB 13.6 04/03/2021 0825   HGB 11.5 10/07/2020  1130   HCT 27.9 (L) 02/20/2023 0356   HCT 34.0 10/07/2020 1130   PLT 217 02/20/2023 0356   PLT 260 04/03/2021 0825   PLT 368 10/07/2020 1130   MCV 86.4 02/20/2023 0356   MCV 82 10/07/2020 1130   MCH 28.8 02/20/2023 0356   MCHC 33.3 02/20/2023 0356   RDW 13.1 02/20/2023 0356   RDW 13.1 10/07/2020 1130   LYMPHSABS 1.4 12/12/2022 1846   MONOABS 1.1 (H) 12/12/2022 1846   EOSABS 0.2 12/12/2022 1846   BASOSABS 0.1 12/12/2022 1846    Imaging   CT Angio Chest 12/12/2022:  1. Negative for acute pulmonary  embolism. 2. Age indeterminate inferior endplate compression fracture of T10 was not present on CT 05/27/2022. 3. Subacute or chronic right lateral sixth rib fracture.  Echocardiogram 09/06/2020 1. Left ventricular ejection fraction, by estimation, is 60 to 65%. The  left ventricle has normal function. The left ventricle has no regional  wall motion abnormalities. Left ventricular diastolic parameters are  consistent with Grade I diastolic  dysfunction (impaired relaxation).   2. Right ventricular systolic function is normal. The right ventricular  size is normal. There is normal pulmonary artery systolic pressure.   3. The mitral valve is normal in structure. Mild mitral valve  regurgitation. No evidence of mitral stenosis.   4. The aortic valve is normal in structure. Aortic valve regurgitation is  not visualized. Mild aortic valve sclerosis is present, with no evidence  of aortic valve stenosis.   5. The inferior vena cava is normal in size with greater than 50%  respiratory variability, suggesting right atrial pressure of 3 mmHg.      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 Terri at [facility], severe osteoarthritis of the right hip with question of infiltrative disease presenting to the pulmonary clinic today for  exertional shortness of breath.  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 Advised mouth rinsing. []  Continue with albuterol  2 puffs every 6 hours as needed for shortness of breath. []  Referral to pulmonary rehab.   #History of marginal zone lymphoma  08/22 diagnosed with a biopsy of cervical lymph node status post 3 cycles of BR (Bendamustine - Rituximab) last dose in December 2023 follows with Dr. Terri at [facility].   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  I spent 34 minutes caring for this patient today, including preparing to see the patient, obtaining a medical history , reviewing a separately obtained history, performing a medically appropriate examination and/or  evaluation, ordering medications, tests, or procedures, referring and communicating with other health care professionals (not separately reported), documenting clinical information in the electronic health record, and independently interpreting results (not separately reported/billed) and communicating results to the patient/family/caregiver  Darrin Barn, MD [place] [facility] 06/12/2023 9:24 PM

## 2023-06-16 ENCOUNTER — Telehealth: Payer: Self-pay | Admitting: Pulmonary Disease

## 2023-06-16 NOTE — Telephone Encounter (Signed)
 Patient needs to be referred to a pulmonary rehab in [facility] preferably with [facility]. Going to [place] is too far of a drive for her. Call back number [phone]

## 2023-06-17 NOTE — Telephone Encounter (Signed)
They have [facility] in [facility]. I called and spoke with Melissa she stated they only do Heart not Pulmonary at this time. She stated there is a location in [place], [place], [place] and of course [employer] [facility]. I will call the patient and see what she would like to do

## 2023-06-17 NOTE — Telephone Encounter (Signed)
I spoke to Deanna [name] and she stated that she might as well come to [facility] to do her rehab. I have fixed the order so Card[facility]hab can reach back out to the patient to get her scheduled

## 2023-06-24 ENCOUNTER — Emergency Department (HOSPITAL_BASED_OUTPATIENT_CLINIC_OR_DEPARTMENT_OTHER): Payer: MEDICARE

## 2023-06-24 ENCOUNTER — Other Ambulatory Visit: Payer: Self-pay

## 2023-06-24 ENCOUNTER — Encounter (HOSPITAL_BASED_OUTPATIENT_CLINIC_OR_DEPARTMENT_OTHER): Payer: Self-pay | Admitting: *Deleted

## 2023-06-24 ENCOUNTER — Emergency Department (HOSPITAL_BASED_OUTPATIENT_CLINIC_OR_DEPARTMENT_OTHER)
Admission: EM | Admit: 2023-06-24 | Discharge: 2023-06-24 | Disposition: A | Discharge status: Home / Self Care | Payer: MEDICARE | Attending: Emergency Medicine | Admitting: Emergency Medicine

## 2023-06-24 DIAGNOSIS — Z79899 Other long term (current) drug therapy: Secondary | ICD-10-CM | POA: Insufficient documentation

## 2023-06-24 DIAGNOSIS — J449 Chronic obstructive pulmonary disease, unspecified: Secondary | ICD-10-CM | POA: Insufficient documentation

## 2023-06-24 DIAGNOSIS — R0600 Dyspnea, unspecified: Secondary | ICD-10-CM | POA: Diagnosis present

## 2023-06-24 DIAGNOSIS — Z87891 Personal history of nicotine dependence: Secondary | ICD-10-CM | POA: Diagnosis not present

## 2023-06-24 DIAGNOSIS — R0602 Shortness of breath: Secondary | ICD-10-CM | POA: Insufficient documentation

## 2023-06-24 DIAGNOSIS — I10 Essential (primary) hypertension: Secondary | ICD-10-CM | POA: Insufficient documentation

## 2023-06-24 DIAGNOSIS — E876 Hypokalemia: Secondary | ICD-10-CM | POA: Diagnosis not present

## 2023-06-24 DIAGNOSIS — R002 Palpitations: Secondary | ICD-10-CM | POA: Diagnosis not present

## 2023-06-24 DIAGNOSIS — R051 Acute cough: Secondary | ICD-10-CM | POA: Diagnosis not present

## 2023-06-24 DIAGNOSIS — Z7901 Long term (current) use of anticoagulants: Secondary | ICD-10-CM | POA: Diagnosis not present

## 2023-06-24 LAB — BASIC METABOLIC PANEL
Anion gap: 11 (ref 5–15)
BUN: 18 mg/dL (ref 8–23)
CO2: 24 mmol/L (ref 22–32)
Calcium: 10 mg/dL (ref 8.9–10.3)
Chloride: 100 mmol/L (ref 98–111)
Creatinine, Ser: 0.77 mg/dL (ref 0.44–1.00)
GFR, Estimated: >60 mL/min (ref >60)
Glucose, Bld: 114 mg/dL — ABNORMAL HIGH (ref 70–99)
Potassium: 3.4 mmol/L — ABNORMAL LOW (ref 3.5–5.1)
Sodium: 135 mmol/L (ref 135–145)

## 2023-06-24 LAB — CBC
HCT: 40.5 % (ref 36.0–46.0)
Hemoglobin: 13.5 g/dL (ref 12.0–15.0)
MCH: 27.4 pg (ref 26.0–34.0)
MCHC: 33.3 g/dL (ref 30.0–36.0)
MCV: 82.2 fL (ref 80.0–100.0)
Platelets: 293 10*3/uL (ref 150–400)
RBC: 4.93 MIL/uL (ref 3.87–5.11)
RDW: 16.1 % — ABNORMAL HIGH (ref 11.5–15.5)
WBC: 8.6 10*3/uL (ref 4.0–10.5)
nRBC: 0 % (ref 0.0–0.2)

## 2023-06-24 LAB — TROPONIN I (HIGH SENSITIVITY)
Troponin I (High Sensitivity): 25 ng/L — ABNORMAL HIGH (ref <18)
Troponin I (High Sensitivity): 21 ng/L — ABNORMAL HIGH (ref <18)

## 2023-06-24 LAB — BRAIN NATRIURETIC PEPTIDE: B Natriuretic Peptide: 84.7 pg/mL (ref 0.0–100.0)

## 2023-06-24 MED ORDER — AZITHROMYCIN 250 MG PO TABS: 250.0000 mg | ORAL_TABLET | Freq: Every day | ORAL | 0 refills | Status: DC

## 2023-06-24 MED ORDER — AMOXICILLIN-POT CLAVULANATE 875-125 MG PO TABS: 1.0000 | ORAL_TABLET | Freq: Two times a day (BID) | ORAL | 0 refills | Status: DC

## 2023-06-24 NOTE — ED Provider Notes (Signed)
77 year old female history of atrial fibrillation on Eliquis, COPD, marginal zone lymphoma, hypertension presenting for palpitations, shortness of breath for 3 days.  Symptoms are intermittent.  She is feeling better.  She tracks her heart rate at home has not had any episodes of tachycardia.  EKG here shows first-degree AV block and left bundle branch block, no signs of ischemia.  Troponin of 25 which is actually less than her baseline.  Will trend.  No chest pain lower concern for ACS.  Chest x-ray reviewed, possible aspiration versus atelectasis.  She does have some cough which this could be causing.  No signs of arrhythmia here.  Will trend troponin.  Troponin stable, patient asymptomatic without recurrence of symptoms.  Will plan for close cardiology follow up.   Laurence Spates, MD 06/25/23 [phone]

## 2023-06-24 NOTE — Discharge Instructions (Addendum)
As discussed, your heart enzymes appear normal; does not appear like you are having a heart attack.  Chest x-ray did show evidence of possible pneumonia so we will treat this with antibiotics in the outpatient setting.  Unsure of exact reason why you feel like you are having palpitations or feelings as if your heart is racing.  Recommend follow-up with your cardiologist for reevaluation of your palpitations.  I suspect that your difficulty breathing could be in part to underlying COPD versus this pneumonia.  Recommend follow-up with your primary care/pulmonologist in the outpatient setting regarding current pneumonia.  Please do not hesitate to return to emergency department if the worrisome signs and symptoms we discussed become apparent.

## 2023-06-24 NOTE — ED Provider Notes (Signed)
[place] [facility] AT [facility] Provider Note   CSN: [id number] Arrival date & time: 06/24/23  1320     History  Chief Complaint  Patient presents with   Shortness of Breath    Deanna [name] is a 78 y.o. female.   Shortness of Breath   78 year old female presents emergency department with complaints of shortness of breath, palpitations, cough.  Patient states that she is somewhat chronically with cough secondary to her COPD and "may slightly be worse."  States that she has had shortness of breath worsened with exertion not necessarily positionally related.  Denies any chest pain.  States that this improves with her at home inhalers.  Also reporting palpitations that she has had for the past 3 nights.  States that they occur when she is lying down at bed at night and usually last for a few minutes before resolving on their own.  Patient states that she does breathing exercises which helps her go to bed whenever she feels them.  She does take her blood pressure when these occurrences happen and has been in the 150s/160s systolic as well as taking her heart rate which has been in the 70s and 80s.  Patient does have a app that she shows at bedside with documented heart rates as well as blood pressures during occurrences over the past few nights.  Denies any fevers, chills, abdominal pain, nausea, vomiting, urinary symptoms, change in bowel habits.  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 Authorizing Provider  amoxicillin-clavulanate (AUGMENTIN) 875-125 MG tablet Take 1 tablet by mouth every 12 (twelve) hours. 06/24/23  Yes Sherian Maroon A, PA   azithromycin (ZITHROMAX) 250 MG tablet Take 1 tablet (250 mg total) by mouth daily. Take first 2 tablets together, then 1 every day until finished. 06/24/23  Yes Sherian Maroon A, PA  albuterol (VENTOLIN HFA) 108 (90 Base) MCG/ACT inhaler Inhale 2 puffs into the lungs every 6 (six) hours as needed for wheezing or shortness of breath. 05/21/23   Assaker, [place], MD  apixaban (ELIQUIS) 5 MG TABS tablet Take 1 tablet (5 mg total) by mouth 2 (two) times daily. Hold medication from 9/15 to 9/21 for right hip surgery 02/11/23   [name], MD  Ascorbic Acid (VITAMIN C) 1000 MG tablet Take 4,000 mg by mouth daily.    [provider]  budesonide-formoterol (SYMBICORT) 160-4.5 MCG/ACT inhaler Inhale 2 puffs into the lungs in the morning and at bedtime. 06/09/23   Assaker, [place], MD  Calcium-Magnesium (CAL-MAG PO) Take 2,000 mg by mouth at bedtime. 1000 mg    [provider]  Carboxymethylcellul-Glycerin (CLEAR EYES FOR DRY EYES OP) Place 1 drop into both eyes daily.    [provider]  [employer] MAGNESIUM OXIDE 250 MG TABS TAKE 1 TABLET (250 MG TOTAL) BY MOUTH EVERY EVENING. TAKE WITH EVENING MEAL Patient taking differently: Take 250 mg by mouth daily. 09/15/21   Arnette Felts, FNP  diltiazem (CARDIZEM SR) 120 MG 12 hr capsule Take 1 capsule (120 mg total) by mouth every 12 (twelve) hours. 02/11/23   [name], MD  furosemide (LASIX) 20 MG tablet Take 20 mg by mouth as needed. Patient not taking: Reported on 06/09/2023 02/05/23   [provider]  hydrochlorothiazide (HYDRODIURIL) 25 MG tablet Take 1 tablet (25 mg total) by mouth 2 (two) times daily. Patient taking differently: Take 1 tablet by mouth daily. 10/07/20   Arnette Felts, FNP  HYDROcodone-acetaminophen (NORCO/VICODIN) 5-325 MG tablet Take 1-2 tablets by mouth every 4 (four) hours as needed for severe pain. Patient not taking: Reported on 06/09/2023 02/19/23   Cassandria Anger, PA-C  loratadine (CLARITIN) 10 MG  tablet Take 10 mg by mouth daily as needed for allergies.    [provider]  losartan (COZAAR) 50 MG tablet TAKE 1 TABLET BY MOUTH EVERY DAY 01/14/23   Arnette Felts, FNP  methocarbamol (ROBAXIN) 500 MG tablet Take 1 tablet (500 mg total) by mouth every 6 (six) hours as needed for muscle spasms (muscle pain). Patient not taking: Reported on 06/09/2023 02/19/23   Cassandria Anger, PA-C  metoprolol tartrate (LOPRESSOR) 25 MG tablet Take 1 tablet (25 mg total) by mouth 2 (two) times daily. 02/11/23   [name], MD  milk thistle 175 MG tablet Take 175 mg by mouth daily.    [provider]  nitroGLYCERIN (NITROSTAT) 0.4 MG SL tablet Place 1 tablet (0.4 mg total) under the tongue every 5 (five) minutes x 3 doses as needed for chest pain. 02/11/23   [name], MD  polyethylene glycol (MIRALAX / GLYCOLAX) 17 g packet Take 17 g by mouth 2 (two) times daily. Patient not taking: Reported on 06/09/2023 02/19/23   Cassandria Anger, PA-C  Probiotic Product (PROBIOTIC DAILY PO) Take 1 tablet by mouth daily.    [provider]  rosuvastatin (CRESTOR) 20 MG tablet Take 1 tablet (20 mg total) by mouth daily. 02/12/23   [name], MD  umeclidinium bromide (INCRUSE ELLIPTA) 62.5 MCG/ACT AEPB Inhale 1 puff into the lungs daily. 02/16/23   Janann Colonel, MD  Vitamin A 2400 MCG (8000 UT) CAPS Take 16,000 Units by mouth daily. 8000 units    [provider]  Vitamin D, Ergocalciferol, (DRISDOL) 1.25 MG (50000 UNIT) CAPS capsule Take 50,000 Units by mouth once a week.    [provider]      Allergies    Codeine, Gluten meal, Dust mite extract, Gabapentin, Sulfa antibiotics, and Terbinafine hcl    Review of Systems   Review of Systems  Respiratory:  Positive for shortness of breath.   All other systems reviewed and are negative.   Physical Exam Updated Vital Signs BP 135/72   Pulse 68   Temp 97.9 F (36.6 C)   Resp 18   SpO2 93%  Physical Exam Vitals  and nursing note reviewed.  Constitutional:      General: She is not in acute distress.    Appearance: She is well-developed.  HENT:     Head: Normocephalic and atraumatic.  Eyes:     Conjunctiva/sclera: Conjunctivae normal.  Cardiovascular:     Rate and Rhythm: Normal rate and regular rhythm.     Pulses: Normal pulses.  Pulmonary:     Effort: Pulmonary effort is normal. No respiratory distress.     Breath sounds: Normal breath sounds. No rhonchi or rales.  Abdominal:     Palpations: Abdomen is soft.     Tenderness: There is no abdominal tenderness. There is no guarding.  Musculoskeletal:        General: No swelling.     Cervical back: Neck supple.     Right lower leg: Edema present.     Left lower leg: Edema present.  Comments: 1-2+ bilateral extremity edema.  Skin:    General: Skin is warm and dry.     Capillary Refill: Capillary refill takes less than 2 seconds.  Neurological:     Mental Status: She is alert.  Psychiatric:        Mood and Affect: Mood normal.     ED Results / Procedures / Treatments   Labs (all labs ordered are listed, but only abnormal results are displayed) Labs Reviewed  BASIC METABOLIC PANEL - Abnormal; Notable for the following components:      Result Value   Potassium 3.4 (*)    Glucose, Bld 114 (*)    All other components within normal limits  CBC - Abnormal; Notable for the following components:   RDW 16.1 (*)    All other components within normal limits  TROPONIN I (HIGH SENSITIVITY) - Abnormal; Notable for the following components:   Troponin I (High Sensitivity) 25 (*)    All other components within normal limits  TROPONIN I (HIGH SENSITIVITY) - Abnormal; Notable for the following components:   Troponin I (High Sensitivity) 21 (*)    All other components within normal limits  RESP PANEL BY RT-PCR (RSV, FLU A&B, COVID)  RVPGX2  BRAIN NATRIURETIC PEPTIDE    EKG EKG Interpretation Date/Time:  Thursday June 24 2023 13:36:16  EST Ventricular Rate:  79 PR Interval:  224 QRS Duration:  144 QT Interval:  440 QTC Calculation: 504 R Axis:   -35  Text Interpretation: Sinus rhythm with 1st degree A-V block Left axis deviation Left bundle branch block Abnormal ECG When compared with ECG of 09-Feb-2023 19:10, PREVIOUS ECG IS PRESENT Confirmed by Fulton Reek [phone]) on 06/24/2023 3:36:22 PM  Radiology DG Chest 2 View Result Date: 06/24/2023 CLINICAL DATA:  Three day history of shortness of breath EXAM: CHEST - 2 VIEW COMPARISON:  Chest radiograph dated 02/09/2023 FINDINGS: Normal lung volumes. Bibasilar patchy opacities, right-greater-than-left. No pleural effusion or pneumothorax. Similar mildly enlarged cardiomediastinal silhouette. No acute osseous abnormality. Partially imaged lumbar spinal fixation hardware. IMPRESSION: 1. Bibasilar patchy opacities, right-greater-than-left, which may represent atelectasis, aspiration, or pneumonia. 2. Similar mild cardiomegaly. Electronically Signed   By: Agustin Cree M.D.   On: 06/24/2023 14:10    Procedures Procedures    Medications Ordered in ED Medications - No data to display  ED Course/ Medical Decision Making/ A&P                                 Medical Decision Making Amount and/or Complexity of Data Reviewed Labs: ordered. Radiology: ordered.  Risk Prescription drug management.   This patient presents to the ED for concern of shortness of breath, palpitations, this involves an extensive number of treatment options, and is a complaint that carries with it a high risk of complications and morbidity.  The differential diagnosis includes PE, ACS, pneumothorax, COPD/asthma, pneumonia, viral URI, anemia, hyperthyroidism, other   Co morbidities that complicate the patient evaluation  See HPI   Additional history obtained:  Additional history obtained from EMR External records from outside source obtained and reviewed including hospital record   Lab  Tests:  I Ordered, and personally interpreted labs.  The pertinent results include: No leukocytosis.  No evidence of anemia.  Platelets within range.  Mild hypokalemia 3.4 by otherwise, electrolytes within normal limits.  No renal dysfunction.  BNP within normal limits.  Troponin of 25 initially with repeat 21.  Respiratory viral panel pending.   Imaging Studies ordered:  I ordered imaging studies including chest x-ray I independently visualized and interpreted imaging which showed bibasilar patchy opacities greater than left.  Stable mild cardiomegaly. I agree with the radiologist interpretation  Cardiac Monitoring: / EKG:  The patient was maintained on a cardiac monitor.  I personally viewed and interpreted the cardiac monitored which showed an underlying rhythm of: Sinus rhythm with history AV block, left bundle branch block   Consultations Obtained:  I requested consultation with attending physician Dr. Earlene Plater who is agreement treatment plan going forward  Problem List / ED Course / Critical interventions / Medication management  Dyspnea, palpitations Reevaluation of the patient showed that the patient stayed the same I have reviewed the patients home medicines and have made adjustments as needed   Social Determinants of Health:  Former cigarette use.  Denies illicit drug use.   Test / Admission - Considered:  Dyspnea, palpitations Vitals signs within normal range and stable throughout visit. Laboratory/imaging studies significant for: See above 78 year old female presents emergency department with complaints of dyspnea as well as palpitations.  Regarding dyspnea, present for the past 3 days or so with some relief of inhalers while in the outpatient setting..  On exam, lungs relatively clear to auscultation bilaterally.  Lower extremity edema present bilaterally but patient states she has been somewhat more sedentary due to recently fracturing her right sided patella.  Workup  today overall reassuring.  Negative BNP without evidence of pulmonary vascular ingestion/pleural effusion on chest x-ray imaging; low suspicion for CHF.  Patient with initial troponin of 25 with repeat 21; EKG with sinus rhythm, first-degree AV block, pulm branch block without evidence of any acute ischemic change from prior EKG performed.  Patient without obvious chest pain; low suspicion for ACS.  Patient already anticoagulated on Eliquis; low suspicion for PE.  Chest x-ray obtained which did show evidence of aspiration pneumonia.  Per patient, has a chronic cough with mayb possible  change recently and also expressed some concerns that she "may have aspirated" at some point over the past few days.  Suspect symptoms of slightly worsening dyspnea could be secondary to aspiration pneumonia.  Will place patient on antibiotics for empiric treatment and have her follow-up with PCP/pulmonology in outpatient setting.  Regarding palpitations, seem to be at night when she is laying down and not necessarily exertionally related.  Patient checked her heart rate during these times and it is been normal.  Patient observed on cardiac monitoring for her 4-1/2 hours while in the ED without obvious arrhythmia.  Unsure exact etiology of patient's feelings of palpitations will recommend follow-up with cardiology in the outpatient setting for reassessment as she already has establish care.  Treatment plan discussed length with patient and she acknowledged understanding was agreeable to said plan.  Patient overall well-appearing, afebrile in no acute distress. Worrisome signs and symptoms were discussed with the patient, and the patient acknowledged understanding to return to the ED if noticed. Patient was stable upon discharge.          Final Clinical Impression(s) / ED Diagnoses Final diagnoses:  Acute cough  Dyspnea, unspecified type  Palpitations    Rx / DC Orders ED Discharge Orders          Ordered     azithromycin (ZITHROMAX) 250 MG tablet  Daily        06/24/23 1707    amoxicillin-clavulanate (AUGMENTIN) 875-125 MG tablet  Every 12 hours  06/24/23 1707              Peter Garter, PA 06/24/23 1940    Franne Forts, DO 06/28/23 2329

## 2023-06-24 NOTE — ED Triage Notes (Signed)
Pt arrives POV from home for sob which has been going on for 3 days.  Pt has a feeling like her heart is racing while laying down.  Pt is alert and oriented.  Pt reports that she is on elaquis.

## 2023-06-24 NOTE — ED Notes (Signed)
IV REMOVED.

## 2023-06-24 NOTE — ED Notes (Signed)
Pt alert and oriented X 4 at the time of discharge. RR even and unlabored. No acute distress noted. Pt verbalized understanding of discharge instructions as discussed. Pt ambulatory to lobby at time of discharge.

## 2023-06-25 ENCOUNTER — Telehealth: Payer: Self-pay | Admitting: Pulmonary Disease

## 2023-06-25 NOTE — Telephone Encounter (Signed)
Patient seen at [facility] Emergency Room. Prescribed antibx. Would like Dr. Larinda Buttery to review xray's. Patient phone number is [phone].

## 2023-06-25 NOTE — Telephone Encounter (Signed)
I spoke with the patient. She was seen in the ED yesterday for cough. They told her she might have pneumonia and gave her an antibiotic. She said she does not want to take the antibiotic until someone looks at her chest xray and confirms she has pneumonia.   Dr. Larinda Buttery is out of the office. Dr. Aundria Rud please advise. Xray is in the [facility] system.

## 2023-06-25 NOTE — Telephone Encounter (Signed)
I have notified the patient. Nothing further needed.

## 2023-06-28 ENCOUNTER — Encounter: Payer: MEDICARE | Attending: Pulmonary Disease

## 2023-06-28 ENCOUNTER — Other Ambulatory Visit: Payer: Self-pay

## 2023-06-28 DIAGNOSIS — R0602 Shortness of breath: Secondary | ICD-10-CM | POA: Insufficient documentation

## 2023-06-28 DIAGNOSIS — Z5189 Encounter for other specified aftercare: Secondary | ICD-10-CM | POA: Insufficient documentation

## 2023-06-28 NOTE — Progress Notes (Signed)
Virtual Visit completed. Patient informed on EP and RD appointment and 6 Minute walk test. Patient also informed of patient health questionnaires on My Chart. Patient Verbalizes understanding. Visit diagnosis can be found in [facility] 06/09/2023.

## 2023-06-30 VITALS — Ht 63.5 in | Wt 165.8 lb

## 2023-06-30 DIAGNOSIS — R0602 Shortness of breath: Secondary | ICD-10-CM | POA: Diagnosis not present

## 2023-06-30 DIAGNOSIS — Z5189 Encounter for other specified aftercare: Secondary | ICD-10-CM | POA: Diagnosis present

## 2023-06-30 NOTE — Patient Instructions (Signed)
Patient Instructions  Patient Details  Name: Deanna [name] MRN: [id number] Date of Birth: 12/08/1945 Referring Provider:  Janann Colonel, MD  Below are your personal goals for exercise, nutrition, and risk factors. Our goal is to help you stay on track towards obtaining and maintaining these goals. We will be discussing your progress on these goals with you throughout the program.  Initial Exercise Prescription:  Initial Exercise Prescription - 06/30/23 1600       Date of Initial Exercise RX and Referring Provider   Date 06/30/23    Referring Provider Assaker, [place], MD      Oxygen   Maintain Oxygen Saturation 88% or higher      NuStep   Level 1    SPM 80    Minutes 15    METs 1.38      Arm Ergometer   Level 1    RPM 30    Minutes 15    METs 1.38      Biostep-RELP   Level 1    SPM 50    Minutes 15    METs 1.38      Track   Laps 9    Minutes 15    METs 1.49      Prescription Details   Frequency (times per week) 2    Duration Progress to 30 minutes of continuous aerobic without signs/symptoms of physical distress      Intensity   THRR 40-80% of Max Heartrate 101-129    Ratings of Perceived Exertion 11-13    Perceived Dyspnea 0-4      Progression   Progression Continue to progress workloads to maintain intensity without signs/symptoms of physical distress.      Resistance Training   Training Prescription Yes    Weight 2 lb upper, 4 lb lower    Reps 10-15             Exercise Goals: Frequency: Be able to perform aerobic exercise two to three times per week in program working toward 2-5 days per week of home exercise.  Intensity: Work with a perceived exertion of 11 (fairly light) - 15 (hard) while following your exercise prescription.  We will make changes to your prescription with you as you progress through the program.   Duration: Be able to do 30 to 45 minutes of continuous aerobic exercise in addition to a 5 minute warm-up and a 5  minute cool-down routine.   Nutrition Goals: Your personal nutrition goals will be established when you do your nutrition analysis with the dietician.  The following are general nutrition guidelines to follow: Cholesterol < 200mg /day Sodium < 1500mg /day Fiber: Women over 50 yrs - 21 grams per day  Personal Goals:  Personal Goals and Risk Factors at Admission - 06/30/23 1622       Core Components/Risk Factors/Patient Goals on Admission    Weight Management Yes;Weight Loss    Intervention Weight Management: Develop a combined nutrition and exercise program designed to reach desired caloric intake, while maintaining appropriate intake of nutrient and fiber, sodium and fats, and appropriate energy expenditure required for the weight goal.;Weight Management: Provide education and appropriate resources to help participant work on and attain dietary goals.;Weight Management/Obesity: Establish reasonable short term and long term weight goals.;Obesity: Provide education and appropriate resources to help participant work on and attain dietary goals.    Admit Weight 165 lb 12.8 oz (75.2 kg)    Goal Weight: Short Term 158 lb 12.8 oz (72 kg)  Goal Weight: Long Term 151 lb 12.8 oz (68.9 kg)    Expected Outcomes Short Term: Continue to assess and modify interventions until short term weight is achieved;Long Term: Adherence to nutrition and physical activity/exercise program aimed toward attainment of established weight goal;Weight Loss: Understanding of general recommendations for a balanced deficit meal plan, which promotes 1-2 lb weight loss per week and includes a negative energy balance of [phone] kcal/d;Understanding recommendations for meals to include 15-35% energy as protein, 25-35% energy from fat, 35-60% energy from carbohydrates, less than 200mg  of dietary cholesterol, 20-35 gm of total fiber daily;Understanding of distribution of calorie intake throughout the day with the consumption of 4-5  meals/snacks    Improve shortness of breath with ADL's Yes    Intervention Provide education, individualized exercise plan and daily activity instruction to help decrease symptoms of SOB with activities of daily living.    Expected Outcomes Short Term: Improve cardiorespiratory fitness to achieve a reduction of symptoms when performing ADLs;Long Term: Be able to perform more ADLs without symptoms or delay the onset of symptoms    Hypertension Yes    Intervention Provide education on lifestyle modifcations including regular physical activity/exercise, weight management, moderate sodium restriction and increased consumption of fresh fruit, vegetables, and low fat dairy, alcohol moderation, and smoking cessation.;Monitor prescription use compliance.    Expected Outcomes Short Term: Continued assessment and intervention until BP is < 140/26mm HG in hypertensive participants. < 130/42mm HG in hypertensive participants with diabetes, heart failure or chronic kidney disease.;Long Term: Maintenance of blood pressure at goal levels.    Lipids Yes    Intervention Provide education and support for participant on nutrition & aerobic/resistive exercise along with prescribed medications to achieve LDL 70mg , HDL >40mg .    Expected Outcomes Short Term: Participant states understanding of desired cholesterol values and is compliant with medications prescribed. Participant is following exercise prescription and nutrition guidelines.;Long Term: Cholesterol controlled with medications as prescribed, with individualized exercise RX and with personalized nutrition plan. Value goals: LDL < 70mg , HDL > 40 mg.             Tobacco Use Initial Evaluation: Social History   Tobacco Use  Smoking Status Former   Current packs/day: 0.00   Average packs/day: 0.5 packs/day for 30.0 years (15.0 ttl pk-yrs)   Types: Cigarettes   Start date: 01/01/1968   Quit date: 12/31/1997   Years since quitting: 25.5  Smokeless Tobacco  Never  Tobacco Comments   Quit in 1999    Exercise Goals and Review:  Exercise Goals     Row Name 06/30/23 1621             Exercise Goals   Increase Physical Activity Yes       Intervention Provide advice, education, support and counseling about physical activity/exercise needs.;Develop an individualized exercise prescription for aerobic and resistive training based on initial evaluation findings, risk stratification, comorbidities and participant's personal goals.       Expected Outcomes Short Term: Attend rehab on a regular basis to increase amount of physical activity.;Long Term: Add in home exercise to make exercise part of routine and to increase amount of physical activity.;Long Term: Exercising regularly at least 3-5 days a week.       Increase Strength and Stamina Yes       Intervention Provide advice, education, support and counseling about physical activity/exercise needs.;Develop an individualized exercise prescription for aerobic and resistive training based on initial evaluation findings, risk stratification, comorbidities and participant's personal  goals.       Expected Outcomes Short Term: Increase workloads from initial exercise prescription for resistance, speed, and METs.;Short Term: Perform resistance training exercises routinely during rehab and add in resistance training at home;Long Term: Improve cardiorespiratory fitness, muscular endurance and strength as measured by increased METs and functional capacity ( )       Able to understand and use rate of perceived exertion (RPE) scale Yes       Intervention Provide education and explanation on how to use RPE scale       Expected Outcomes Short Term: Able to use RPE daily in rehab to express subjective intensity level;Long Term:  Able to use RPE to guide intensity level when exercising independently       Able to understand and use Dyspnea scale Yes       Intervention Provide education and explanation on how to use  Dyspnea scale       Expected Outcomes Short Term: Able to use Dyspnea scale daily in rehab to express subjective sense of shortness of breath during exertion;Long Term: Able to use Dyspnea scale to guide intensity level when exercising independently       Knowledge and understanding of Target Heart Rate Range (THRR) Yes       Intervention Provide education and explanation of THRR including how the numbers were predicted and where they are located for reference       Expected Outcomes Short Term: Able to state/look up THRR;Short Term: Able to use daily as guideline for intensity in rehab;Long Term: Able to use THRR to govern intensity when exercising independently       Able to check pulse independently Yes       Intervention Provide education and demonstration on how to check pulse in carotid and radial arteries.;Review the importance of being able to check your own pulse for safety during independent exercise       Expected Outcomes Short Term: Able to explain why pulse checking is important during independent exercise;Long Term: Able to check pulse independently and accurately       Understanding of Exercise Prescription Yes       Intervention Provide education, explanation, and written materials on patient's individual exercise prescription       Expected Outcomes Short Term: Able to explain program exercise prescription;Long Term: Able to explain home exercise prescription to exercise independently

## 2023-06-30 NOTE — Progress Notes (Signed)
Pulmonary Individual Treatment Plan  Patient Details  Name: Deanna [name] MRN: [id number] Date of Birth: 1946/04/06 Referring Provider:   Flowsheet Row Pulmonary Rehab from 06/30/2023 in [facility] Cardiac and Pulmonary Rehab  Referring Provider Assaker, [place], MD       Initial Encounter Date:  Flowsheet Row Pulmonary Rehab from 06/30/2023 in [facility] Cardiac and Pulmonary Rehab  Date 06/30/23       Visit Diagnosis: SOB (shortness of breath)  Patient's Home Medications on Admission:  Current Outpatient Medications:    albuterol (VENTOLIN HFA) 108 (90 Base) MCG/ACT inhaler, Inhale 2 puffs into the lungs every 6 (six) hours as needed for wheezing or shortness of breath., Disp: 8 g, Rfl: 2   amoxicillin-clavulanate (AUGMENTIN) 875-125 MG tablet, Take 1 tablet by mouth every 12 (twelve) hours. (Patient not taking: Reported on 06/28/2023), Disp: 14 tablet, Rfl: 0   apixaban (ELIQUIS) 5 MG TABS tablet, Take 1 tablet (5 mg total) by mouth 2 (two) times daily. Hold medication from 9/15 to 9/21 for right hip surgery, Disp: 60 tablet, Rfl: 3   Ascorbic Acid (VITAMIN C) 1000 MG tablet, Take 4,000 mg by mouth daily., Disp: , Rfl:    azithromycin (ZITHROMAX) 250 MG tablet, Take 1 tablet (250 mg total) by mouth daily. Take first 2 tablets together, then 1 every day until finished. (Patient not taking: Reported on 06/28/2023), Disp: 6 tablet, Rfl: 0   budesonide-formoterol (SYMBICORT) 160-4.5 MCG/ACT inhaler, Inhale 2 puffs into the lungs in the morning and at bedtime., Disp: 1 each, Rfl: 12   Calcium-Magnesium (CAL-MAG PO), Take 2,000 mg by mouth at bedtime. 1000 mg, Disp: , Rfl:    Carboxymethylcellul-Glycerin (CLEAR EYES FOR DRY EYES OP), Place 1 drop into both eyes daily., Disp: , Rfl:    [employer] MAGNESIUM OXIDE 250 MG TABS, TAKE 1 TABLET (250 MG TOTAL) BY MOUTH EVERY EVENING. TAKE WITH EVENING MEAL (Patient taking differently: Take 250 mg by mouth daily.), Disp: 100 tablet, Rfl: 1   diltiazem (CARDIZEM  SR) 120 MG 12 hr capsule, Take 1 capsule (120 mg total) by mouth every 12 (twelve) hours., Disp: 60 capsule, Rfl: 6   furosemide (LASIX) 20 MG tablet, Take 20 mg by mouth as needed. (Patient not taking: Reported on 06/09/2023), Disp: , Rfl:    hydrochlorothiazide (HYDRODIURIL) 25 MG tablet, Take 1 tablet (25 mg total) by mouth 2 (two) times daily. (Patient taking differently: Take 1 tablet by mouth daily.), Disp: 180 tablet, Rfl: 3   HYDROcodone-acetaminophen (NORCO/VICODIN) 5-325 MG tablet, Take 1-2 tablets by mouth every 4 (four) hours as needed for severe pain. (Patient not taking: Reported on 06/28/2023), Disp: 42 tablet, Rfl: 0   loratadine (CLARITIN) 10 MG tablet, Take 10 mg by mouth daily as needed for allergies., Disp: , Rfl:    losartan (COZAAR) 50 MG tablet, TAKE 1 TABLET BY MOUTH EVERY DAY, Disp: 90 tablet, Rfl: 1   methocarbamol (ROBAXIN) 500 MG tablet, Take 1 tablet (500 mg total) by mouth every 6 (six) hours as needed for muscle spasms (muscle pain). (Patient not taking: Reported on 06/28/2023), Disp: 40 tablet, Rfl: 2   metoprolol tartrate (LOPRESSOR) 25 MG tablet, Take 1 tablet (25 mg total) by mouth 2 (two) times daily., Disp: 60 tablet, Rfl: 6   milk thistle 175 MG tablet, Take 175 mg by mouth daily., Disp: , Rfl:    nitroGLYCERIN (NITROSTAT) 0.4 MG SL tablet, Place 1 tablet (0.4 mg total) under the tongue every 5 (five) minutes x 3 doses as needed  for chest pain., Disp: 25 tablet, Rfl: 1   polyethylene glycol (MIRALAX / GLYCOLAX) 17 g packet, Take 17 g by mouth 2 (two) times daily. (Patient not taking: Reported on 06/28/2023), Disp: 14 each, Rfl: 0   Probiotic Product (PROBIOTIC DAILY PO), Take 1 tablet by mouth daily., Disp: , Rfl:    rosuvastatin (CRESTOR) 20 MG tablet, Take 1 tablet (20 mg total) by mouth daily., Disp: 30 tablet, Rfl: 6   umeclidinium bromide (INCRUSE ELLIPTA) 62.5 MCG/ACT AEPB, Inhale 1 puff into the lungs daily. (Patient not taking: Reported on 06/28/2023), Disp: 1  each, Rfl: 6   Vitamin A 2400 MCG (8000 UT) CAPS, Take 16,000 Units by mouth daily. 8000 units, Disp: , Rfl:    Vitamin D, Ergocalciferol, (DRISDOL) 1.25 MG (50000 UNIT) CAPS capsule, Take 50,000 Units by mouth once a week., Disp: , Rfl:   Past Medical History: Past Medical History:  Diagnosis Date   A-fib (HCC)    Abnormal glucose 03/06/2020   ACS (acute coronary syndrome) (HCC)    Age-related osteoporosis without current pathological fracture 11/22/2015   Overview:  Femoral neck  Formatting of this note might be different from the original. 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 Added automatically from request for surgery [id number]   Chronic pain syndrome 04/01/2015   Colon polyps    Dyspnea    Essential hypertension 07/20/2000   Overview:  HBP   Family history of adverse reaction to anesthesia    mother delerium   Female cystocele 02/27/2009   GAD (generalized anxiety disorder) 08/19/2015   GERD (gastroesophageal reflux disease)    Heart murmur    History of Bell's palsy 03/21/2015   Hx of adenomatous colonic polyps 12/20/2008   Overview:  Colonoscopy 07/2003 - [facility] - polyps removed - recommended repeat in 3 years Colonoscopy 09/2011 - Dr. Darrol Jump - hyperplastic polyp(s) were removed - repeat in 5 years   Hypertension    Incomplete uterovaginal prolapse 03/04/2004   Insomnia    Left bundle-branch block 05/20/2006   Lumbar radiculopathy 01/09/2016   Formatting of this note might be different from the original. 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 Femoral neck   Other intervertebral disc degeneration, lumbar region 01/29/2015   Pre-operative cardiovascular examination 09/03/2020   Primary osteoarthritis, right hand 02/28/2002   Overview:  OA HANDS, ESP THUMBS   Radial tunnel syndrome, right 11/04/2016   Radiculopathy, lumbar region 11/22/2014   Overview:  Added automatically from request for surgery [id number]   Rhinitis, allergic 02/24/2002   Overview:  ALLERGY SYMPTOMS - states was seen by allergist in past and informed allergic to dust   S/P left total hip arthroplasty 09/17/2020   Sacroiliitis (HCC) 08/02/2019   Screening for colon cancer 04/17/2005   Formatting of this note might be different from the original. 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 TRIGGER THUMBS - started spring 2003. Injected 02/28/02   Unspecified asthma, uncomplicated 12/25/2004    Tobacco Use: Social History   Tobacco Use  Smoking Status Former   Current packs/day: 0.00   Average packs/day: 0.5 packs/day for 30.0 years (15.0 ttl pk-yrs)   Types: Cigarettes   Start date: 01/01/1968   Quit date: 12/31/1997   Years since quitting: 25.5  Smokeless Tobacco Never  Tobacco Comments   Quit in 1999    Labs: Review Flowsheet  More data exists      Latest Ref Rng & Units 01/23/2021 06/26/2021 11/04/2021 02/10/2023 02/11/2023  Labs for ITP Cardiac and Pulmonary Rehab  Cholestrol 0 - 200 mg/dL - - 119  147  -  LDL (calc) 0 - 99  mg/dL - - 89  96  -  HDL-C >82 mg/dL - - 54  42  -  Trlycerides <150 mg/dL - - 47  956  -  Hemoglobin A1c 4.8 - 5.6 % 6.1  6.0  5.8  - 6.0      Pulmonary Assessment Scores:   UCSD: Self-administered rating of dyspnea associated with activities of daily living (ADLs) 6-point scale (0 = "not at all" to 5 = "maximal or unable to do because of breathlessness")  Scoring Scores range from 0 to 120.  Minimally important difference is 5 units  CAT: CAT can identify the health impairment of COPD patients and is better correlated with disease progression.  CAT has a scoring range of zero to 40. The CAT score is classified into four groups of low (less than 10), medium (10 - 20), high (21-30) and very high (31-40) based on the impact level of disease on health status. A CAT score over 10 suggests significant symptoms.  A worsening CAT score could be explained by an exacerbation, poor medication adherence, poor inhaler technique, or progression of COPD or comorbid conditions.  CAT MCID is 2 points  mMRC: mMRC (Modified Medical Research Council) Dyspnea Scale is used to assess the degree of baseline functional disability in patients of respiratory disease due to dyspnea. No minimal important difference is established. A decrease in score of 1 point or greater is considered a positive change.   Pulmonary Function Assessment:  Pulmonary Function Assessment - 06/28/23 1027       Breath   Shortness of Breath Yes;Limiting activity;Fear of Shortness of Breath             Exercise Target Goals: Exercise Program Goal: Individual exercise prescription set using results from initial 6 min walk test and THRR while considering  patient's activity barriers and safety.   Exercise Prescription Goal: Initial exercise prescription builds to 30-45 minutes a day of aerobic activity, 2-3 days per week.  Home exercise guidelines will be given to patient during program as part of exercise prescription that the  participant will acknowledge.  Education: Aerobic Exercise: - Group verbal and visual presentation on the components of exercise prescription. Introduces F.I.T.T principle from ACSM for exercise prescriptions.  Reviews F.I.T.T. principles of aerobic exercise including progression. Written material given at graduation.   Education: Resistance Exercise: - Group verbal and visual presentation on the components of  exercise prescription. Introduces F.I.T.T principle from ACSM for exercise prescriptions  Reviews F.I.T.T. principles of resistance exercise including progression. Written material given at graduation.    Education: Exercise & Equipment Safety: - Individual verbal instruction and demonstration of equipment use and safety with use of the equipment. Flowsheet Row Pulmonary Rehab from 06/30/2023 in [facility] Cardiac and Pulmonary Rehab  Date 06/30/23  Educator MB  Instruction Review Code 1- Verbalizes Understanding       Education: Exercise Physiology & General Exercise Guidelines: - Group verbal and written instruction with models to review the exercise physiology of the cardiovascular system and associated critical values. Provides general exercise guidelines with specific guidelines to those with heart or lung disease.    Education: Flexibility, Balance, Mind/Body Relaxation: - Group verbal and visual presentation with interactive activity on the components of exercise prescription. Introduces F.I.T.T principle from ACSM for exercise prescriptions. Reviews F.I.T.T. principles of flexibility and balance exercise training including progression. Also discusses the mind body connection.  Reviews various relaxation techniques to help reduce and manage stress (i.e. Deep breathing, progressive muscle relaxation, and visualization). Balance handout provided to take home. Written material given at graduation.   Activity Barriers & Risk Stratification:  Activity Barriers & Cardiac Risk  Stratification - 06/30/23 1617       Activity Barriers & Cardiac Risk Stratification   Activity Barriers Arthritis;Left Hip Replacement;Right Hip Replacement;Back Problems;Other (comment);Shortness of Breath    Comments R patella fracture             6 Minute Walk:  6 Minute Walk     Row Name 06/30/23 1615         6 Minute Walk   Phase Initial     Distance 600 feet     Walk Time 5.5 minutes     # of Rest Breaks 1     MPH 1.24     METS 1.38     RPE 15     Perceived Dyspnea  3     VO2 Peak 4.83     Symptoms Yes (comment)     Comments SOB     Resting HR 73 bpm     Resting BP 140/60     Resting Oxygen Saturation  92 %     Exercise Oxygen Saturation  during 6 min walk 92 %     Max Ex. HR 96 bpm     Max Ex. BP 160/66     2 Minute Post BP 138/70       Interval HR   1 Minute HR 86     2 Minute HR 91     3 Minute HR 93     4 Minute HR 93     5 Minute HR 96     6 Minute HR 93     2 Minute Post HR 77     Interval Heart Rate? Yes       Interval Oxygen   Interval Oxygen? Yes     Baseline Oxygen Saturation % 92 %     1 Minute Oxygen Saturation % 93 %     1 Minute Liters of Oxygen 0 L     2 Minute Oxygen Saturation % 92 %     2 Minute Liters of Oxygen 0 L     3 Minute Oxygen Saturation % 93 %     3 Minute Liters of Oxygen 0 L     4 Minute Oxygen Saturation % 93 %  4 Minute Liters of Oxygen 0 L     5 Minute Oxygen Saturation % 92 %     5 Minute Liters of Oxygen 0 L     6 Minute Oxygen Saturation % 92 %     6 Minute Liters of Oxygen 0 L     2 Minute Post Oxygen Saturation % 94 %     2 Minute Post Liters of Oxygen 0 L             Oxygen Initial Assessment:  Oxygen Initial Assessment - 06/28/23 1026       Home Oxygen   Home Oxygen Device None    Sleep Oxygen Prescription None    Home Exercise Oxygen Prescription None    Home Resting Oxygen Prescription None      Initial 6 min Walk   Oxygen Used None      Program Oxygen Prescription   Program  Oxygen Prescription None      Intervention   Short Term Goals To learn and exhibit compliance with exercise, home and travel O2 prescription;To learn and understand importance of monitoring SPO2 with pulse oximeter and demonstrate accurate use of the pulse oximeter.;To learn and understand importance of maintaining oxygen saturations>88%;To learn and demonstrate proper pursed lip breathing techniques or other breathing techniques. ;To learn and demonstrate proper use of respiratory medications    Long  Term Goals Exhibits compliance with exercise, home  and travel O2 prescription;Verbalizes importance of monitoring SPO2 with pulse oximeter and return demonstration;Maintenance of O2 saturations>88%;Exhibits proper breathing techniques, such as pursed lip breathing or other method taught during program session;Compliance with respiratory medication;Demonstrates proper use of MDI's             Oxygen Re-Evaluation:   Oxygen Discharge (Final Oxygen Re-Evaluation):   Initial Exercise Prescription:  Initial Exercise Prescription - 06/30/23 1600       Date of Initial Exercise RX and Referring Provider   Date 06/30/23    Referring Provider Assaker, [place], MD      Oxygen   Maintain Oxygen Saturation 88% or higher      NuStep   Level 1    SPM 80    Minutes 15    METs 1.38      Arm Ergometer   Level 1    RPM 30    Minutes 15    METs 1.38      Biostep-RELP   Level 1    SPM 50    Minutes 15    METs 1.38      Track   Laps 9    Minutes 15    METs 1.49      Prescription Details   Frequency (times per week) 2    Duration Progress to 30 minutes of continuous aerobic without signs/symptoms of physical distress      Intensity   THRR 40-80% of Max Heartrate 101-129    Ratings of Perceived Exertion 11-13    Perceived Dyspnea 0-4      Progression   Progression Continue to progress workloads to maintain intensity without signs/symptoms of physical distress.       Resistance Training   Training Prescription Yes    Weight 2 lb upper, 4 lb lower    Reps 10-15             Perform Capillary Blood Glucose checks as needed.  Exercise Prescription Changes:   Exercise Prescription Changes     Row Name 06/30/23 1600  Response to Exercise   Blood Pressure (Admit) 140/60       Blood Pressure (Exercise) 160/66       Blood Pressure (Exit) 138/70       Heart Rate (Admit) 73 bpm       Heart Rate (Exercise) 96 bpm       Heart Rate (Exit) 77 bpm       Oxygen Saturation (Admit) 92 %       Oxygen Saturation (Exercise) 92 %       Oxygen Saturation (Exit) 94 %       Rating of Perceived Exertion (Exercise) 15       Perceived Dyspnea (Exercise) 3       Symptoms SOB       Comments results       Duration Progress to 30 minutes of  aerobic without signs/symptoms of physical distress       Intensity THRR New         Progression   Progression Continue to progress workloads to maintain intensity without signs/symptoms of physical distress.       Average METs 1.38                Exercise Comments:   Exercise Goals and Review:   Exercise Goals     Row Name 06/30/23 1621             Exercise Goals   Increase Physical Activity Yes       Intervention Provide advice, education, support and counseling about physical activity/exercise needs.;Develop an individualized exercise prescription for aerobic and resistive training based on initial evaluation findings, risk stratification, comorbidities and participant's personal goals.       Expected Outcomes Short Term: Attend rehab on a regular basis to increase amount of physical activity.;Long Term: Add in home exercise to make exercise part of routine and to increase amount of physical activity.;Long Term: Exercising regularly at least 3-5 days a week.       Increase Strength and Stamina Yes       Intervention Provide advice, education, support and counseling about physical  activity/exercise needs.;Develop an individualized exercise prescription for aerobic and resistive training based on initial evaluation findings, risk stratification, comorbidities and participant's personal goals.       Expected Outcomes Short Term: Increase workloads from initial exercise prescription for resistance, speed, and METs.;Short Term: Perform resistance training exercises routinely during rehab and add in resistance training at home;Long Term: Improve cardiorespiratory fitness, muscular endurance and strength as measured by increased METs and functional capacity ( )       Able to understand and use rate of perceived exertion (RPE) scale Yes       Intervention Provide education and explanation on how to use RPE scale       Expected Outcomes Short Term: Able to use RPE daily in rehab to express subjective intensity level;Long Term:  Able to use RPE to guide intensity level when exercising independently       Able to understand and use Dyspnea scale Yes       Intervention Provide education and explanation on how to use Dyspnea scale       Expected Outcomes Short Term: Able to use Dyspnea scale daily in rehab to express subjective sense of shortness of breath during exertion;Long Term: Able to use Dyspnea scale to guide intensity level when exercising independently       Knowledge and understanding of Target Heart Rate Range (THRR) Yes  Intervention Provide education and explanation of THRR including how the numbers were predicted and where they are located for reference       Expected Outcomes Short Term: Able to state/look up THRR;Short Term: Able to use daily as guideline for intensity in rehab;Long Term: Able to use THRR to govern intensity when exercising independently       Able to check pulse independently Yes       Intervention Provide education and demonstration on how to check pulse in carotid and radial arteries.;Review the importance of being able to check your own pulse for  safety during independent exercise       Expected Outcomes Short Term: Able to explain why pulse checking is important during independent exercise;Long Term: Able to check pulse independently and accurately       Understanding of Exercise Prescription Yes       Intervention Provide education, explanation, and written materials on patient's individual exercise prescription       Expected Outcomes Short Term: Able to explain program exercise prescription;Long Term: Able to explain home exercise prescription to exercise independently                Exercise Goals Re-Evaluation :   Discharge Exercise Prescription (Final Exercise Prescription Changes):  Exercise Prescription Changes - 06/30/23 1600       Response to Exercise   Blood Pressure (Admit) 140/60    Blood Pressure (Exercise) 160/66    Blood Pressure (Exit) 138/70    Heart Rate (Admit) 73 bpm    Heart Rate (Exercise) 96 bpm    Heart Rate (Exit) 77 bpm    Oxygen Saturation (Admit) 92 %    Oxygen Saturation (Exercise) 92 %    Oxygen Saturation (Exit) 94 %    Rating of Perceived Exertion (Exercise) 15    Perceived Dyspnea (Exercise) 3    Symptoms SOB    Comments results    Duration Progress to 30 minutes of  aerobic without signs/symptoms of physical distress    Intensity THRR New      Progression   Progression Continue to progress workloads to maintain intensity without signs/symptoms of physical distress.    Average METs 1.38             Nutrition:  Target Goals: Understanding of nutrition guidelines, daily intake of sodium 1500mg , cholesterol 200mg , calories 30% from fat and 7% or less from saturated fats, daily to have 5 or more servings of fruits and vegetables.  Education: All About Nutrition: -Group instruction provided by verbal, written material, interactive activities, discussions, models, and posters to present general guidelines for heart healthy nutrition including fat, fiber, MyPlate, the role  of sodium in heart healthy nutrition, utilization of the nutrition label, and utilization of this knowledge for meal planning. Follow up email sent as well. Written material given at graduation.   Biometrics:  Pre Biometrics - 06/30/23 1622       Pre Biometrics   Height 5' 3.5" (1.613 m)    Weight 165 lb 12.8 oz (75.2 kg)    Waist Circumference 39.5 inches    Hip Circumference 44.9 inches    Waist to Hip Ratio 0.88 %    BMI (Calculated) 28.91    Single Leg Stand 6.9 seconds              Nutrition Therapy Plan and Nutrition Goals:  Nutrition Therapy & Goals - 06/30/23 1605       Nutrition Therapy   Diet Carb  controlled, cardiac, low Na    Protein (specify units) 75g    Fiber 25 grams    Whole Grain Foods 3 servings    Saturated Fats 15 max. grams    Fruits and Vegetables 5 servings/day    Sodium 2 grams      Personal Nutrition Goals   Nutrition Goal Eat 15-30gProtein and 30-60gCarbs at each meal.    Personal Goal #2 Practice mindful snacking and limit sugary treats to a few times per week    Personal Goal #3 Read labels and reduce sodium intake to below 2300mg . Ideally 1500mg  per day.    Comments Patient drinking ~60oz of water daily. She usually eats 3 times per day, likes fruits and veggies. Doesn't eat much animal meat, but does like fish and some chicken. She struggles with snacking and sugary foods. Her A1C is ~6.0%. She is aware of carb choices and portions. Reviewed mediterranean diet handout, educated on types of fats, sources, and how to read label. Provided sample meal plates handout and build out meals and snacks with foods she likes and will eat, focusing on adequate protein while controlling complex carb portions and including lots of colorful produce.      Intervention Plan   Intervention Prescribe, educate and counsel regarding individualized specific dietary modifications aiming towards targeted core components such as weight, hypertension, lipid management,  diabetes, heart failure and other comorbidities.;Nutrition handout(s) given to patient.    Expected Outcomes Short Term Goal: Understand basic principles of dietary content, such as calories, fat, sodium, cholesterol and nutrients.;Short Term Goal: A plan has been developed with personal nutrition goals set during dietitian appointment.;Long Term Goal: Adherence to prescribed nutrition plan.             Nutrition Assessments:  MEDIFICTS Score Key: >=70 Need to make dietary changes  40-70 Heart Healthy Diet <= 40 Therapeutic Level Cholesterol Diet   Picture Your Plate Scores: <16 Unhealthy dietary pattern with much room for improvement. 41-50 Dietary pattern unlikely to meet recommendations for good health and room for improvement. 51-60 More healthful dietary pattern, with some room for improvement.  >60 Healthy dietary pattern, although there may be some specific behaviors that could be improved.   Nutrition Goals Re-Evaluation:   Nutrition Goals Discharge (Final Nutrition Goals Re-Evaluation):   Psychosocial: Target Goals: Acknowledge presence or absence of significant depression and/or stress, maximize coping skills, provide positive support system. Participant is able to verbalize types and ability to use techniques and skills needed for reducing stress and depression.   Education: Stress, Anxiety, and Depression - Group verbal and visual presentation to define topics covered.  Reviews how body is impacted by stress, anxiety, and depression.  Also discusses healthy ways to reduce stress and to treat/manage anxiety and depression.  Written material given at graduation.   Education: Sleep Hygiene -Provides group verbal and written instruction about how sleep can affect your health.  Define sleep hygiene, discuss sleep cycles and impact of sleep habits. Review good sleep hygiene tips.    Initial Review & Psychosocial Screening:  Initial Psych Review & Screening - 06/28/23  1028       Initial Review   Current issues with Current Sleep Concerns;Current Stress Concerns    Source of Stress Concerns Chronic Illness      Family Dynamics   Good Support System? Yes    Comments Nina lives by herself and can call her daughterif she needs support. Sometimes she has problems sleeping due to her shortness of  breath. She states no depression and does not take anything for her mood.      Barriers   Psychosocial barriers to participate in program The patient should benefit from training in stress management and relaxation.      Screening Interventions   Interventions Encouraged to exercise;To provide support and resources with identified psychosocial needs;Provide feedback about the scores to participant    Expected Outcomes Short Term goal: Utilizing psychosocial counselor, staff and physician to assist with identification of specific Stressors or current issues interfering with healing process. Setting desired goal for each stressor or current issue identified.;Long Term Goal: Stressors or current issues are controlled or eliminated.;Short Term goal: Identification and review with participant of any Quality of Life or Depression concerns found by scoring the questionnaire.;Long Term goal: The participant improves quality of Life and PHQ9 Scores as seen by post scores and/or verbalization of changes             Quality of Life Scores:  Scores of 19 and below usually indicate a poorer quality of life in these areas.  A difference of  2-3 points is a clinically meaningful difference.  A difference of 2-3 points in the total score of the Quality of Life Index has been associated with significant improvement in overall quality of life, self-image, physical symptoms, and general health in studies assessing change in quality of life.  PHQ-9: Review Flowsheet  More data exists      06/30/2023 01/14/2022 12/26/2020 12/21/2019 08/14/2019  Depression screen PHQ 2/9  Decreased  Interest 1 0 0 0 0  Down, Depressed, Hopeless 0 0 0 0 0  PHQ - 2 Score 1 0 0 0 0  Altered sleeping 2 - - - -  Tired, decreased energy 2 - - - -  Change in appetite 0 - - - -  Feeling bad or failure about yourself  0 - - - -  Trouble concentrating 1 - - - -  Moving slowly or fidgety/restless 0 - - - -  Suicidal thoughts 0 - - - -  PHQ-9 Score 6 - - - -  Difficult doing work/chores Very difficult - - - -   Interpretation of Total Score  Total Score Depression Severity:  1-4 = Minimal depression, 5-9 = Mild depression, 10-14 = Moderate depression, 15-19 = Moderately severe depression, 20-27 = Severe depression   Psychosocial Evaluation and Intervention:  Psychosocial Evaluation - 06/28/23 1030       Psychosocial Evaluation & Interventions   Interventions Relaxation education;Stress management education;Encouraged to exercise with the program and follow exercise prescription    Comments Shakeisha lives by herself and can call her daughterif she needs support. Sometimes she has problems sleeping due to her shortness of breath. She states no depression and does not take anything for her mood.    Expected Outcomes Short: Attend [facility] stress management education to decrease stress. Long: Maintain exercise Post [facility] to keep stress at a minimum.    Continue Psychosocial Services  Follow up required by staff             Psychosocial Re-Evaluation:   Psychosocial Discharge (Final Psychosocial Re-Evaluation):   Education: Education Goals: Education classes will be provided on a weekly basis, covering required topics. Participant will state understanding/return demonstration of topics presented.  Learning Barriers/Preferences:  Learning Barriers/Preferences - 06/28/23 1027       Learning Barriers/Preferences   Learning Barriers None    Learning Preferences None  General Pulmonary Education Topics:  Infection Prevention: - Provides verbal and written  material to individual with discussion of infection control including proper hand washing and proper equipment cleaning during exercise session. Flowsheet Row Pulmonary Rehab from 06/30/2023 in [facility] Cardiac and Pulmonary Rehab  Date 06/30/23  Educator MB  Instruction Review Code 1- Verbalizes Understanding       Falls Prevention: - Provides verbal and written material to individual with discussion of falls prevention and safety. Flowsheet Row Pulmonary Rehab from 06/30/2023 in [facility] Cardiac and Pulmonary Rehab  Date 06/30/23  Educator MB  Instruction Review Code 1- Verbalizes Understanding       Chronic Lung Disease Review: - Group verbal instruction with posters, models, PowerPoint presentations and videos,  to review new updates, new respiratory medications, new advancements in procedures and treatments. Providing information on websites and "800" numbers for continued self-education. Includes information about supplement oxygen, available portable oxygen systems, continuous and intermittent flow rates, oxygen safety, concentrators, and Medicare reimbursement for oxygen. Explanation of Pulmonary Drugs, including class, frequency, complications, importance of spacers, rinsing mouth after steroid MDI's, and proper cleaning methods for nebulizers. Review of basic lung anatomy and physiology related to function, structure, and complications of lung disease. Review of risk factors. Discussion about methods for diagnosing sleep apnea and types of masks and machines for OSA. Includes a review of the use of types of environmental controls: home humidity, furnaces, filters, dust mite/pet prevention, HEPA vacuums. Discussion about weather changes, air quality and the benefits of nasal washing. Instruction on Warning signs, infection symptoms, calling MD promptly, preventive modes, and value of vaccinations. Review of effective airway clearance, coughing and/or vibration techniques. Emphasizing that all  should Create an Action Plan. Written material given at graduation.   AED/CPR: - Group verbal and written instruction with the use of models to demonstrate the basic use of the AED with the basic ABC's of resuscitation.    Anatomy and Cardiac Procedures: - Group verbal and visual presentation and models provide information about basic cardiac anatomy and function. Reviews the testing methods done to diagnose heart disease and the outcomes of the test results. Describes the treatment choices: Medical Management, Angioplasty, or Coronary Bypass Surgery for treating various heart conditions including Myocardial Infarction, Angina, Valve Disease, and Cardiac Arrhythmias.  Written material given at graduation.   Medication Safety: - Group verbal and visual instruction to review commonly prescribed medications for heart and lung disease. Reviews the medication, class of the drug, and side effects. Includes the steps to properly store meds and maintain the prescription regimen.  Written material given at graduation.   Other: -Provides group and verbal instruction on various topics (see comments)   Knowledge Questionnaire Score:    Core Components/Risk Factors/Patient Goals at Admission:  Personal Goals and Risk Factors at Admission - 06/30/23 1622       Core Components/Risk Factors/Patient Goals on Admission    Weight Management Yes;Weight Loss    Intervention Weight Management: Develop a combined nutrition and exercise program designed to reach desired caloric intake, while maintaining appropriate intake of nutrient and fiber, sodium and fats, and appropriate energy expenditure required for the weight goal.;Weight Management: Provide education and appropriate resources to help participant work on and attain dietary goals.;Weight Management/Obesity: Establish reasonable short term and long term weight goals.;Obesity: Provide education and appropriate resources to help participant work on and  attain dietary goals.    Admit Weight 165 lb 12.8 oz (75.2 kg)    Goal Weight: Short Term 158  lb 12.8 oz (72 kg)    Goal Weight: Long Term 151 lb 12.8 oz (68.9 kg)    Expected Outcomes Short Term: Continue to assess and modify interventions until short term weight is achieved;Long Term: Adherence to nutrition and physical activity/exercise program aimed toward attainment of established weight goal;Weight Loss: Understanding of general recommendations for a balanced deficit meal plan, which promotes 1-2 lb weight loss per week and includes a negative energy balance of [phone] kcal/d;Understanding recommendations for meals to include 15-35% energy as protein, 25-35% energy from fat, 35-60% energy from carbohydrates, less than 200mg  of dietary cholesterol, 20-35 gm of total fiber daily;Understanding of distribution of calorie intake throughout the day with the consumption of 4-5 meals/snacks    Improve shortness of breath with ADL's Yes    Intervention Provide education, individualized exercise plan and daily activity instruction to help decrease symptoms of SOB with activities of daily living.    Expected Outcomes Short Term: Improve cardiorespiratory fitness to achieve a reduction of symptoms when performing ADLs;Long Term: Be able to perform more ADLs without symptoms or delay the onset of symptoms    Hypertension Yes    Intervention Provide education on lifestyle modifcations including regular physical activity/exercise, weight management, moderate sodium restriction and increased consumption of fresh fruit, vegetables, and low fat dairy, alcohol moderation, and smoking cessation.;Monitor prescription use compliance.    Expected Outcomes Short Term: Continued assessment and intervention until BP is < 140/60mm HG in hypertensive participants. < 130/64mm HG in hypertensive participants with diabetes, heart failure or chronic kidney disease.;Long Term: Maintenance of blood pressure at goal levels.     Lipids Yes    Intervention Provide education and support for participant on nutrition & aerobic/resistive exercise along with prescribed medications to achieve LDL 70mg , HDL >40mg .    Expected Outcomes Short Term: Participant states understanding of desired cholesterol values and is compliant with medications prescribed. Participant is following exercise prescription and nutrition guidelines.;Long Term: Cholesterol controlled with medications as prescribed, with individualized exercise RX and with personalized nutrition plan. Value goals: LDL < 70mg , HDL > 40 mg.             Education:Diabetes - Individual verbal and written instruction to review signs/symptoms of diabetes, desired ranges of glucose level fasting, after meals and with exercise. Acknowledge that pre and post exercise glucose checks will be done for 3 sessions at entry of program.   Know Your Numbers and Heart Failure: - Group verbal and visual instruction to discuss disease risk factors for cardiac and pulmonary disease and treatment options.  Reviews associated critical values for Overweight/Obesity, Hypertension, Cholesterol, and Diabetes.  Discusses basics of heart failure: signs/symptoms and treatments.  Introduces Heart Failure Zone chart for action plan for heart failure.  Written material given at graduation.   Core Components/Risk Factors/Patient Goals Review:    Core Components/Risk Factors/Patient Goals at Discharge (Final Review):    ITP Comments:  ITP Comments     Row Name 06/28/23 1025 06/30/23 1614         ITP Comments Virtual Visit completed. Patient informed on EP and RD appointment and 6 Minute walk test. Patient also informed of patient health questionnaires on My Chart. Patient Verbalizes understanding. Visit diagnosis can be found in [facility] 06/09/2023. Completed and gym orientation. Initial ITP created and sent for review to Dr. Jinny Sanders, Medical Director.               Comments:  Initial ITP

## 2023-06-30 NOTE — Progress Notes (Signed)
Assessment start time: 3:14 PM  Digestive issues/concerns: gluten intolerance  24-hours Recall: B: BLT L: eggplant casserole, cheese, spinach, veggies D: egg plant casserole  Beverages water (60oz), cup of coffee, 1/4cup of orange juice some days, ginger ale Alcohol none Caffeine coffee  Education r/t nutrition plan Patient drinking ~60oz of water daily. She usually eats 3 times per day, likes fruits and veggies. Doesn't eat much animal meat, but does like fish and some chicken. She struggles with snacking and sugary foods. Her A1C is ~6.0%. She is aware of carb choices and portions. Reviewed mediterranean diet handout, educated on types of fats, sources, and how to read label. Provided sample meal plates handout and build out meals and snacks with foods she likes and will eat, focusing on adequate protein while controlling complex carb portions and including lots of colorful produce.   Goal 1: Eat 15-30gProtein and 30-60gCarbs at each meal. Goal 2: Practice mindful snacking and limit sugary treats to a few times per week Goal 3: Read labels and reduce sodium intake to below 2300mg . Ideally 1500mg  per day.   End time 3:52 PM

## 2023-07-07 ENCOUNTER — Encounter: Payer: MEDICARE | Attending: Pulmonary Disease | Admitting: *Deleted

## 2023-07-07 DIAGNOSIS — R0602 Shortness of breath: Secondary | ICD-10-CM | POA: Diagnosis present

## 2023-07-07 NOTE — Progress Notes (Signed)
 Daily Session Note  Patient Details  Name: Deanna [name] MRN: [id number] Date of Birth: 01/19/1946 Referring Provider:   Flowsheet Row Pulmonary Rehab from 06/30/2023 in [facility] Cardiac and Pulmonary Rehab  Referring Provider Malka Domino, MD       Encounter Date: 07/07/2023  Check In:  Session Check In - 07/07/23 1136       Check-In   Supervising physician immediately available to respond to emergencies See telemetry face sheet for immediately available ER MD    Location [facility]-Cardiac & Pulmonary Rehab    Staff Present Othel Durand, RN, BSN, CCRP;Margaret Best, MS, Exercise Physiologist;Joseph Rolinda RCP,RRT,BSRT;Meredith Tressa RN,BSN    Virtual Visit No    Medication changes reported     No    Fall or balance concerns reported    No    Warm-up and Cool-down Performed on first and last piece of equipment    Resistance Training Performed Yes    VAD Patient? No    PAD/SET Patient? No      Pain Assessment   Currently in Pain? No/denies                Social History   Tobacco Use  Smoking Status Former   Current packs/day: 0.00   Average packs/day: 0.5 packs/day for 30.0 years (15.0 ttl pk-yrs)   Types: Cigarettes   Start date: 01/01/1968   Quit date: 12/31/1997   Years since quitting: 25.5  Smokeless Tobacco Never  Tobacco Comments   Quit in 1999    Goals Met:  Proper associated with RPD/PD & O2 Sat Exercise tolerated well Personal goals reviewed No report of concerns or symptoms today  Goals Unmet:  Not Applicable  Comments: Pt able to follow exercise prescription today without complaint.  Will continue to monitor for progression. First full day of exercise!  Patient was oriented to gym and equipment including functions, settings, policies, and procedures.  Patient's individual exercise prescription and treatment plan were reviewed.  All starting workloads were established based on the results of the 6 minute walk test done at initial orientation visit.   The plan for exercise progression was also introduced and progression will be customized based on patient's performance and goals.    Dr. Oneil Pinal is Medical Director for [facility] Cardiac Rehabilitation.  Dr. Fuad Aleskerov is Medical Director for [facility] Pulmonary Rehabilitation.

## 2023-07-12 ENCOUNTER — Encounter: Payer: MEDICARE | Admitting: *Deleted

## 2023-07-12 DIAGNOSIS — R0602 Shortness of breath: Secondary | ICD-10-CM | POA: Diagnosis not present

## 2023-07-12 NOTE — Progress Notes (Signed)
 Daily Session Note  Patient Details  Name: Oprah Handke Shedrick MRN: [id number] Date of Birth: 24-Sep-1945 Referring Provider:   Flowsheet Row Pulmonary Rehab from 06/30/2023 in [facility] Cardiac and Pulmonary Rehab  Referring Provider Annitta Kindler, MD       Encounter Date: 07/12/2023  Check In:  Session Check In - 07/12/23 1411       Check-In   Supervising physician immediately available to respond to emergencies See telemetry face sheet for immediately available ER MD    Location [facility]-Cardiac & Pulmonary Rehab    Staff Present Maud Sorenson, RN, BSN, CCRP;Meredith Craven RN,BSN;Maxon Buckhorn BS, Exercise Physiologist;Margaret Best, MS, Exercise Physiologist;Kelly Bollinger RN,BSN,MPA    Virtual Visit No    Medication changes reported     No    Fall or balance concerns reported    No    Warm-up and Cool-down Performed on first and last piece of equipment    Resistance Training Performed Yes    VAD Patient? No    PAD/SET Patient? No      Pain Assessment   Currently in Pain? No/denies                Social History   Tobacco Use  Smoking Status Former   Current packs/day: 0.00   Average packs/day: 0.5 packs/day for 30.0 years (15.0 ttl pk-yrs)   Types: Cigarettes   Start date: 01/01/1968   Quit date: 12/31/1997   Years since quitting: 25.5  Smokeless Tobacco Never  Tobacco Comments   Quit in 1999    Goals Met:  Proper associated with RPD/PD & O2 Sat Independence with exercise equipment Exercise tolerated well No report of concerns or symptoms today  Goals Unmet:  Not Applicable  Comments: Pt able to follow exercise prescription today without complaint.  Will continue to monitor for progression.    Dr. Firman Hughes is Medical Director for [facility] Cardiac Rehabilitation.  Dr. Fuad Aleskerov is Medical Director for [facility] Pulmonary Rehabilitation.

## 2023-07-14 DIAGNOSIS — R0602 Shortness of breath: Secondary | ICD-10-CM

## 2023-07-14 NOTE — Progress Notes (Signed)
Pulmonary Individual Treatment Plan  Patient Details  Name: Deanna [name] MRN: [id number] Date of Birth: July 10, 1945 Referring Provider:   Flowsheet Row Pulmonary Rehab from 06/30/2023 in [facility] Cardiac and Pulmonary Rehab  Referring Provider Assaker, [place], MD       Initial Encounter Date:  Flowsheet Row Pulmonary Rehab from 06/30/2023 in [facility] Cardiac and Pulmonary Rehab  Date 06/30/23       Visit Diagnosis: SOB (shortness of breath)  Patient's Home Medications on Admission:  Current Outpatient Medications:    albuterol (VENTOLIN HFA) 108 (90 Base) MCG/ACT inhaler, Inhale 2 puffs into the lungs every 6 (six) hours as needed for wheezing or shortness of breath., Disp: 8 g, Rfl: 2   amoxicillin-clavulanate (AUGMENTIN) 875-125 MG tablet, Take 1 tablet by mouth every 12 (twelve) hours. (Patient not taking: Reported on 06/28/2023), Disp: 14 tablet, Rfl: 0   apixaban (ELIQUIS) 5 MG TABS tablet, Take 1 tablet (5 mg total) by mouth 2 (two) times daily. Hold medication from 9/15 to 9/21 for right hip surgery, Disp: 60 tablet, Rfl: 3   Ascorbic Acid (VITAMIN C) 1000 MG tablet, Take 4,000 mg by mouth daily., Disp: , Rfl:    azithromycin (ZITHROMAX) 250 MG tablet, Take 1 tablet (250 mg total) by mouth daily. Take first 2 tablets together, then 1 every day until finished. (Patient not taking: Reported on 06/28/2023), Disp: 6 tablet, Rfl: 0   budesonide-formoterol (SYMBICORT) 160-4.5 MCG/ACT inhaler, Inhale 2 puffs into the lungs in the morning and at bedtime., Disp: 1 each, Rfl: 12   Calcium-Magnesium (CAL-MAG PO), Take 2,000 mg by mouth at bedtime. 1000 mg, Disp: , Rfl:    Carboxymethylcellul-Glycerin (CLEAR EYES FOR DRY EYES OP), Place 1 drop into both eyes daily., Disp: , Rfl:    [employer] MAGNESIUM OXIDE 250 MG TABS, TAKE 1 TABLET (250 MG TOTAL) BY MOUTH EVERY EVENING. TAKE WITH EVENING MEAL (Patient taking differently: Take 250 mg by mouth daily.), Disp: 100 tablet, Rfl: 1   diltiazem (CARDIZEM  SR) 120 MG 12 hr capsule, Take 1 capsule (120 mg total) by mouth every 12 (twelve) hours., Disp: 60 capsule, Rfl: 6   furosemide (LASIX) 20 MG tablet, Take 20 mg by mouth as needed. (Patient not taking: Reported on 06/09/2023), Disp: , Rfl:    hydrochlorothiazide (HYDRODIURIL) 25 MG tablet, Take 1 tablet (25 mg total) by mouth 2 (two) times daily. (Patient taking differently: Take 1 tablet by mouth daily.), Disp: 180 tablet, Rfl: 3   HYDROcodone-acetaminophen (NORCO/VICODIN) 5-325 MG tablet, Take 1-2 tablets by mouth every 4 (four) hours as needed for severe pain. (Patient not taking: Reported on 06/28/2023), Disp: 42 tablet, Rfl: 0   loratadine (CLARITIN) 10 MG tablet, Take 10 mg by mouth daily as needed for allergies., Disp: , Rfl:    losartan (COZAAR) 50 MG tablet, TAKE 1 TABLET BY MOUTH EVERY DAY, Disp: 90 tablet, Rfl: 1   methocarbamol (ROBAXIN) 500 MG tablet, Take 1 tablet (500 mg total) by mouth every 6 (six) hours as needed for muscle spasms (muscle pain). (Patient not taking: Reported on 06/28/2023), Disp: 40 tablet, Rfl: 2   metoprolol tartrate (LOPRESSOR) 25 MG tablet, Take 1 tablet (25 mg total) by mouth 2 (two) times daily., Disp: 60 tablet, Rfl: 6   milk thistle 175 MG tablet, Take 175 mg by mouth daily., Disp: , Rfl:    nitroGLYCERIN (NITROSTAT) 0.4 MG SL tablet, Place 1 tablet (0.4 mg total) under the tongue every 5 (five) minutes x 3 doses as needed  for chest pain., Disp: 25 tablet, Rfl: 1   polyethylene glycol (MIRALAX / GLYCOLAX) 17 g packet, Take 17 g by mouth 2 (two) times daily. (Patient not taking: Reported on 06/28/2023), Disp: 14 each, Rfl: 0   Probiotic Product (PROBIOTIC DAILY PO), Take 1 tablet by mouth daily., Disp: , Rfl:    rosuvastatin (CRESTOR) 20 MG tablet, Take 1 tablet (20 mg total) by mouth daily., Disp: 30 tablet, Rfl: 6   umeclidinium bromide (INCRUSE ELLIPTA) 62.5 MCG/ACT AEPB, Inhale 1 puff into the lungs daily. (Patient not taking: Reported on 06/28/2023), Disp: 1  each, Rfl: 6   Vitamin A 2400 MCG (8000 UT) CAPS, Take 16,000 Units by mouth daily. 8000 units, Disp: , Rfl:    Vitamin D, Ergocalciferol, (DRISDOL) 1.25 MG (50000 UNIT) CAPS capsule, Take 50,000 Units by mouth once a week., Disp: , Rfl:   Past Medical History: Past Medical History:  Diagnosis Date   A-fib (HCC)    Abnormal glucose 03/06/2020   ACS (acute coronary syndrome) (HCC)    Age-related osteoporosis without current pathological fracture 11/22/2015   Overview:  Femoral neck  Formatting of this note might be different from the original. 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 Added automatically from request for surgery [id number]   Chronic pain syndrome 04/01/2015   Colon polyps    Dyspnea    Essential hypertension 07/20/2000   Overview:  HBP   Family history of adverse reaction to anesthesia    mother delerium   Female cystocele 02/27/2009   GAD (generalized anxiety disorder) 08/19/2015   GERD (gastroesophageal reflux disease)    Heart murmur    History of Bell's palsy 03/21/2015   Hx of adenomatous colonic polyps 12/20/2008   Overview:  Colonoscopy 07/2003 - [facility] - polyps removed - recommended repeat in 3 years Colonoscopy 09/2011 - Dr. Darrol Jump - hyperplastic polyp(s) were removed - repeat in 5 years   Hypertension    Incomplete uterovaginal prolapse 03/04/2004   Insomnia    Left bundle-branch block 05/20/2006   Lumbar radiculopathy 01/09/2016   Formatting of this note might be different from the original. 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 Femoral neck   Other intervertebral disc degeneration, lumbar region 01/29/2015   Pre-operative cardiovascular examination 09/03/2020   Primary osteoarthritis, right hand 02/28/2002   Overview:  OA HANDS, ESP THUMBS   Radial tunnel syndrome, right 11/04/2016   Radiculopathy, lumbar region 11/22/2014   Overview:  Added automatically from request for surgery [id number]   Rhinitis, allergic 02/24/2002   Overview:  ALLERGY SYMPTOMS - states was seen by allergist in past and informed allergic to dust   S/P left total hip arthroplasty 09/17/2020   Sacroiliitis (HCC) 08/02/2019   Screening for colon cancer 04/17/2005   Formatting of this note might be different from the original. 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 TRIGGER THUMBS - started spring 2003. Injected 02/28/02   Unspecified asthma, uncomplicated 12/25/2004    Tobacco Use: Social History   Tobacco Use  Smoking Status Former   Current packs/day: 0.00   Average packs/day: 0.5 packs/day for 30.0 years (15.0 ttl pk-yrs)   Types: Cigarettes   Start date: 01/01/1968   Quit date: 12/31/1997   Years since quitting: 25.5  Smokeless Tobacco Never  Tobacco Comments   Quit in 1999    Labs: Review Flowsheet  More data exists      Latest Ref Rng & Units 01/23/2021 06/26/2021 11/04/2021 02/10/2023 02/11/2023  Labs for ITP Cardiac and Pulmonary Rehab  Cholestrol 0 - 200 mg/dL - - 413  244  -  LDL (calc) 0 - 99  mg/dL - - 89  96  -  HDL-C >01 mg/dL - - 54  42  -  Trlycerides <150 mg/dL - - 47  027  -  Hemoglobin A1c 4.8 - 5.6 % 6.1  6.0  5.8  - 6.0      Pulmonary Assessment Scores:  Pulmonary Assessment Scores     Row Name 07/07/23 1348         ADL UCSD   ADL Phase Entry     SOB Score total 76     Rest 1     Walk 4     Stairs 5     Bath 2     Dress 3     Shop 3       CAT Score   CAT Score 21              UCSD: Self-administered rating of dyspnea associated with activities of daily living (ADLs) 6-point scale (0 = "not at all" to 5 = "maximal or unable to do because of breathlessness")  Scoring Scores range from 0 to 120.  Minimally important difference is 5 units  CAT: CAT can identify the health impairment of COPD patients and is better correlated with disease progression.  CAT has a scoring range of zero to 40. The CAT score is classified into four groups of low (less than 10), medium (10 - 20), high (21-30) and very high (31-40) based on the impact level of disease on health status. A CAT score over 10 suggests significant symptoms.  A worsening CAT score could be explained by an exacerbation, poor medication adherence, poor inhaler technique, or progression of COPD or comorbid conditions.  CAT MCID is 2 points  mMRC: mMRC (Modified Medical Research Council) Dyspnea Scale is used to assess the degree of baseline functional disability in patients of respiratory disease due to dyspnea. No minimal important difference is established. A decrease in score of 1 point or greater is considered a positive change.   Pulmonary Function Assessment:  Pulmonary Function Assessment - 06/28/23 1027       Breath   Shortness of Breath Yes;Limiting activity;Fear of Shortness of Breath             Exercise Target Goals: Exercise Program Goal: Individual exercise prescription set using results from initial 6 min walk test and THRR while considering  patient's activity barriers and  safety.  Exercise Prescription Goal: Initial exercise prescription builds to 30-45 minutes a day of aerobic activity, 2-3 days per week.  Home exercise guidelines will be given to patient during program as part of exercise prescription that the participant will acknowledge.  Education: Aerobic Exercise: - Group verbal and visual presentation on the components of exercise prescription. Introduces F.I.T.T principle from ACSM for exercise prescriptions.  Reviews F.I.T.T. principles of aerobic exercise including progression. Written material given at graduation.   Education: Resistance Exercise: - Group verbal and visual presentation on the components of exercise prescription. Introduces F.I.T.T principle from ACSM for exercise prescriptions  Reviews F.I.T.T. principles of resistance exercise including progression. Written material given at graduation.    Education: Exercise & Equipment Safety: - Individual verbal instruction and demonstration of equipment use and safety with use of the equipment. Flowsheet Row Pulmonary Rehab from 06/30/2023 in [facility] Cardiac and Pulmonary Rehab  Date 06/30/23  Educator MB  Instruction Review Code 1- Verbalizes Understanding       Education: Exercise Physiology & General Exercise Guidelines: - Group verbal and written instruction with models to review the exercise physiology of the cardiovascular system and associated critical values. Provides general exercise guidelines with specific guidelines to those with heart or lung disease.    Education: Flexibility, Balance, Mind/Body Relaxation: - Group verbal and visual presentation with interactive activity on the components of exercise prescription. Introduces F.I.T.T principle from ACSM for exercise prescriptions. Reviews F.I.T.T. principles of flexibility and balance exercise training including progression. Also discusses the mind body connection.  Reviews various relaxation techniques to help reduce and manage  stress (i.e. Deep breathing, progressive muscle relaxation, and visualization). Balance handout provided to take home. Written material given at graduation.   Activity Barriers & Risk Stratification:  Activity Barriers & Cardiac Risk Stratification - 06/30/23 1617       Activity Barriers & Cardiac Risk Stratification   Activity Barriers Arthritis;Left Hip Replacement;Right Hip Replacement;Back Problems;Other (comment);Shortness of Breath    Comments R patella fracture             6 Minute Walk:  6 Minute Walk     Row Name 06/30/23 1615         6 Minute Walk   Phase Initial     Distance 600 feet     Walk Time 5.5 minutes     # of Rest Breaks 1     MPH 1.24     METS 1.38     RPE 15     Perceived Dyspnea  3     VO2 Peak 4.83     Symptoms Yes (comment)     Comments SOB     Resting HR 73 bpm     Resting BP 140/60     Resting Oxygen Saturation  92 %     Exercise Oxygen Saturation  during 6 min walk 92 %     Max Ex. HR 96 bpm     Max Ex. BP 160/66     2 Minute Post BP 138/70       Interval HR   1 Minute HR 86     2 Minute HR 91     3 Minute HR 93     4 Minute HR 93     5 Minute HR 96     6 Minute HR 93     2 Minute Post HR 77     Interval Heart Rate? Yes       Interval Oxygen  Interval Oxygen? Yes     Baseline Oxygen Saturation % 92 %     1 Minute Oxygen Saturation % 93 %     1 Minute Liters of Oxygen 0 L     2 Minute Oxygen Saturation % 92 %     2 Minute Liters of Oxygen 0 L     3 Minute Oxygen Saturation % 93 %     3 Minute Liters of Oxygen 0 L     4 Minute Oxygen Saturation % 93 %     4 Minute Liters of Oxygen 0 L     5 Minute Oxygen Saturation % 92 %     5 Minute Liters of Oxygen 0 L     6 Minute Oxygen Saturation % 92 %     6 Minute Liters of Oxygen 0 L     2 Minute Post Oxygen Saturation % 94 %     2 Minute Post Liters of Oxygen 0 L             Oxygen Initial Assessment:  Oxygen Initial Assessment - 06/28/23 1026       Home Oxygen    Home Oxygen Device None    Sleep Oxygen Prescription None    Home Exercise Oxygen Prescription None    Home Resting Oxygen Prescription None      Initial 6 min Walk   Oxygen Used None      Program Oxygen Prescription   Program Oxygen Prescription None      Intervention   Short Term Goals To learn and exhibit compliance with exercise, home and travel O2 prescription;To learn and understand importance of monitoring SPO2 with pulse oximeter and demonstrate accurate use of the pulse oximeter.;To learn and understand importance of maintaining oxygen saturations>88%;To learn and demonstrate proper pursed lip breathing techniques or other breathing techniques. ;To learn and demonstrate proper use of respiratory medications    Long  Term Goals Exhibits compliance with exercise, home  and travel O2 prescription;Verbalizes importance of monitoring SPO2 with pulse oximeter and return demonstration;Maintenance of O2 saturations>88%;Exhibits proper breathing techniques, such as pursed lip breathing or other method taught during program session;Compliance with respiratory medication;Demonstrates proper use of MDI's             Oxygen Re-Evaluation:  Oxygen Re-Evaluation     Row Name 07/07/23 1137             Program Oxygen Prescription   Program Oxygen Prescription None         Home Oxygen   Home Oxygen Device None       Sleep Oxygen Prescription None       Home Exercise Oxygen Prescription None       Home Resting Oxygen Prescription None       Compliance with Home Oxygen Use Yes         Goals/Expected Outcomes   Short Term Goals To learn and demonstrate proper pursed lip breathing techniques or other breathing techniques.        Long  Term Goals Exhibits proper breathing techniques, such as pursed lip breathing or other method taught during program session       Comments Reviewed PLB technique with pt.  Talked about how it works and it's importance in maintaining their exercise  saturations.       Goals/Expected Outcomes Short: Become more profiecient at using PLB. Long: Become independent at using PLB.  Oxygen Discharge (Final Oxygen Re-Evaluation):  Oxygen Re-Evaluation - 07/07/23 1137       Program Oxygen Prescription   Program Oxygen Prescription None      Home Oxygen   Home Oxygen Device None    Sleep Oxygen Prescription None    Home Exercise Oxygen Prescription None    Home Resting Oxygen Prescription None    Compliance with Home Oxygen Use Yes      Goals/Expected Outcomes   Short Term Goals To learn and demonstrate proper pursed lip breathing techniques or other breathing techniques.     Long  Term Goals Exhibits proper breathing techniques, such as pursed lip breathing or other method taught during program session    Comments Reviewed PLB technique with pt.  Talked about how it works and it's importance in maintaining their exercise saturations.    Goals/Expected Outcomes Short: Become more profiecient at using PLB. Long: Become independent at using PLB.             Initial Exercise Prescription:  Initial Exercise Prescription - 06/30/23 1600       Date of Initial Exercise RX and Referring Provider   Date 06/30/23    Referring Provider Assaker, [place], MD      Oxygen   Maintain Oxygen Saturation 88% or higher      NuStep   Level 1    SPM 80    Minutes 15    METs 1.38      Arm Ergometer   Level 1    RPM 30    Minutes 15    METs 1.38      Biostep-RELP   Level 1    SPM 50    Minutes 15    METs 1.38      Track   Laps 9    Minutes 15    METs 1.49      Prescription Details   Frequency (times per week) 2    Duration Progress to 30 minutes of continuous aerobic without signs/symptoms of physical distress      Intensity   THRR 40-80% of Max Heartrate 101-129    Ratings of Perceived Exertion 11-13    Perceived Dyspnea 0-4      Progression   Progression Continue to progress workloads to maintain  intensity without signs/symptoms of physical distress.      Resistance Training   Training Prescription Yes    Weight 2 lb upper, 4 lb lower    Reps 10-15             Perform Capillary Blood Glucose checks as needed.  Exercise Prescription Changes:   Exercise Prescription Changes     Row Name 06/30/23 1600             Response to Exercise   Blood Pressure (Admit) 140/60       Blood Pressure (Exercise) 160/66       Blood Pressure (Exit) 138/70       Heart Rate (Admit) 73 bpm       Heart Rate (Exercise) 96 bpm       Heart Rate (Exit) 77 bpm       Oxygen Saturation (Admit) 92 %       Oxygen Saturation (Exercise) 92 %       Oxygen Saturation (Exit) 94 %       Rating of Perceived Exertion (Exercise) 15       Perceived Dyspnea (Exercise) 3       Symptoms SOB  Comments results       Duration Progress to 30 minutes of  aerobic without signs/symptoms of physical distress       Intensity THRR New         Progression   Progression Continue to progress workloads to maintain intensity without signs/symptoms of physical distress.       Average METs 1.38                Exercise Comments:   Exercise Comments     Row Name 07/07/23 1138           Exercise Comments First full day of exercise!  Patient was oriented to gym and equipment including functions, settings, policies, and procedures.  Patient's individual exercise prescription and treatment plan were reviewed.  All starting workloads were established based on the results of the 6 minute walk test done at initial orientation visit.  The plan for exercise progression was also introduced and progression will be customized based on patient's performance and goals.                Exercise Goals and Review:   Exercise Goals     Row Name 06/30/23 1621             Exercise Goals   Increase Physical Activity Yes       Intervention Provide advice, education, support and counseling about physical  activity/exercise needs.;Develop an individualized exercise prescription for aerobic and resistive training based on initial evaluation findings, risk stratification, comorbidities and participant's personal goals.       Expected Outcomes Short Term: Attend rehab on a regular basis to increase amount of physical activity.;Long Term: Add in home exercise to make exercise part of routine and to increase amount of physical activity.;Long Term: Exercising regularly at least 3-5 days a week.       Increase Strength and Stamina Yes       Intervention Provide advice, education, support and counseling about physical activity/exercise needs.;Develop an individualized exercise prescription for aerobic and resistive training based on initial evaluation findings, risk stratification, comorbidities and participant's personal goals.       Expected Outcomes Short Term: Increase workloads from initial exercise prescription for resistance, speed, and METs.;Short Term: Perform resistance training exercises routinely during rehab and add in resistance training at home;Long Term: Improve cardiorespiratory fitness, muscular endurance and strength as measured by increased METs and functional capacity ( )       Able to understand and use rate of perceived exertion (RPE) scale Yes       Intervention Provide education and explanation on how to use RPE scale       Expected Outcomes Short Term: Able to use RPE daily in rehab to express subjective intensity level;Long Term:  Able to use RPE to guide intensity level when exercising independently       Able to understand and use Dyspnea scale Yes       Intervention Provide education and explanation on how to use Dyspnea scale       Expected Outcomes Short Term: Able to use Dyspnea scale daily in rehab to express subjective sense of shortness of breath during exertion;Long Term: Able to use Dyspnea scale to guide intensity level when exercising independently       Knowledge and  understanding of Target Heart Rate Range (THRR) Yes       Intervention Provide education and explanation of THRR including how the numbers were predicted and where they are located for  reference       Expected Outcomes Short Term: Able to state/look up THRR;Short Term: Able to use daily as guideline for intensity in rehab;Long Term: Able to use THRR to govern intensity when exercising independently       Able to check pulse independently Yes       Intervention Provide education and demonstration on how to check pulse in carotid and radial arteries.;Review the importance of being able to check your own pulse for safety during independent exercise       Expected Outcomes Short Term: Able to explain why pulse checking is important during independent exercise;Long Term: Able to check pulse independently and accurately       Understanding of Exercise Prescription Yes       Intervention Provide education, explanation, and written materials on patient's individual exercise prescription       Expected Outcomes Short Term: Able to explain program exercise prescription;Long Term: Able to explain home exercise prescription to exercise independently                Exercise Goals Re-Evaluation :  Exercise Goals Re-Evaluation     Row Name 07/07/23 1136             Exercise Goal Re-Evaluation   Exercise Goals Review Able to understand and use rate of perceived exertion (RPE) scale;Able to understand and use Dyspnea scale;Knowledge and understanding of Target Heart Rate Range (THRR);Understanding of Exercise Prescription       Comments Reviewed RPE and dyspnea scale, THR and program prescription with pt today.  Pt voiced understanding and was given a copy of goals to take home.       Expected Outcomes Short: Use RPE daily to regulate intensity. Long: Follow program prescription in THR.                Discharge Exercise Prescription (Final Exercise Prescription Changes):  Exercise Prescription  Changes - 06/30/23 1600       Response to Exercise   Blood Pressure (Admit) 140/60    Blood Pressure (Exercise) 160/66    Blood Pressure (Exit) 138/70    Heart Rate (Admit) 73 bpm    Heart Rate (Exercise) 96 bpm    Heart Rate (Exit) 77 bpm    Oxygen Saturation (Admit) 92 %    Oxygen Saturation (Exercise) 92 %    Oxygen Saturation (Exit) 94 %    Rating of Perceived Exertion (Exercise) 15    Perceived Dyspnea (Exercise) 3    Symptoms SOB    Comments results    Duration Progress to 30 minutes of  aerobic without signs/symptoms of physical distress    Intensity THRR New      Progression   Progression Continue to progress workloads to maintain intensity without signs/symptoms of physical distress.    Average METs 1.38             Nutrition:  Target Goals: Understanding of nutrition guidelines, daily intake of sodium 1500mg , cholesterol 200mg , calories 30% from fat and 7% or less from saturated fats, daily to have 5 or more servings of fruits and vegetables.  Education: All About Nutrition: -Group instruction provided by verbal, written material, interactive activities, discussions, models, and posters to present general guidelines for heart healthy nutrition including fat, fiber, MyPlate, the role of sodium in heart healthy nutrition, utilization of the nutrition label, and utilization of this knowledge for meal planning. Follow up email sent as well. Written material given at graduation.   Biometrics:  Pre Biometrics - 06/30/23 1622       Pre Biometrics   Height 5' 3.5" (1.613 m)    Weight 165 lb 12.8 oz (75.2 kg)    Waist Circumference 39.5 inches    Hip Circumference 44.9 inches    Waist to Hip Ratio 0.88 %    BMI (Calculated) 28.91    Single Leg Stand 6.9 seconds              Nutrition Therapy Plan and Nutrition Goals:  Nutrition Therapy & Goals - 06/30/23 1605       Nutrition Therapy   Diet Carb controlled, cardiac, low Na    Protein (specify  units) 75g    Fiber 25 grams    Whole Grain Foods 3 servings    Saturated Fats 15 max. grams    Fruits and Vegetables 5 servings/day    Sodium 2 grams      Personal Nutrition Goals   Nutrition Goal Eat 15-30gProtein and 30-60gCarbs at each meal.    Personal Goal #2 Practice mindful snacking and limit sugary treats to a few times per week    Personal Goal #3 Read labels and reduce sodium intake to below 2300mg . Ideally 1500mg  per day.    Comments Patient drinking ~60oz of water daily. She usually eats 3 times per day, likes fruits and veggies. Doesn't eat much animal meat, but does like fish and some chicken. She struggles with snacking and sugary foods. Her A1C is ~6.0%. She is aware of carb choices and portions. Reviewed mediterranean diet handout, educated on types of fats, sources, and how to read label. Provided sample meal plates handout and build out meals and snacks with foods she likes and will eat, focusing on adequate protein while controlling complex carb portions and including lots of colorful produce.      Intervention Plan   Intervention Prescribe, educate and counsel regarding individualized specific dietary modifications aiming towards targeted core components such as weight, hypertension, lipid management, diabetes, heart failure and other comorbidities.;Nutrition handout(s) given to patient.    Expected Outcomes Short Term Goal: Understand basic principles of dietary content, such as calories, fat, sodium, cholesterol and nutrients.;Short Term Goal: A plan has been developed with personal nutrition goals set during dietitian appointment.;Long Term Goal: Adherence to prescribed nutrition plan.             Nutrition Assessments:  MEDIFICTS Score Key: >=70 Need to make dietary changes  40-70 Heart Healthy Diet <= 40 Therapeutic Level Cholesterol Diet  Flowsheet Row Pulmonary Rehab from 07/07/2023 in [facility] Cardiac and Pulmonary Rehab  Picture Your Plate Total Score on  Admission 72      Picture Your Plate Scores: <19 Unhealthy dietary pattern with much room for improvement. 41-50 Dietary pattern unlikely to meet recommendations for good health and room for improvement. 51-60 More healthful dietary pattern, with some room for improvement.  >60 Healthy dietary pattern, although there may be some specific behaviors that could be improved.   Nutrition Goals Re-Evaluation:   Nutrition Goals Discharge (Final Nutrition Goals Re-Evaluation):   Psychosocial: Target Goals: Acknowledge presence or absence of significant depression and/or stress, maximize coping skills, provide positive support system. Participant is able to verbalize types and ability to use techniques and skills needed for reducing stress and depression.   Education: Stress, Anxiety, and Depression - Group verbal and visual presentation to define topics covered.  Reviews how body is impacted by stress, anxiety, and depression.  Also discusses healthy ways to reduce  stress and to treat/manage anxiety and depression.  Written material given at graduation.   Education: Sleep Hygiene -Provides group verbal and written instruction about how sleep can affect your health.  Define sleep hygiene, discuss sleep cycles and impact of sleep habits. Review good sleep hygiene tips.    Initial Review & Psychosocial Screening:  Initial Psych Review & Screening - 06/28/23 1028       Initial Review   Current issues with Current Sleep Concerns;Current Stress Concerns    Source of Stress Concerns Chronic Illness      Family Dynamics   Good Support System? Yes    Comments Kiera lives by herself and can call her daughterif she needs support. Sometimes she has problems sleeping due to her shortness of breath. She states no depression and does not take anything for her mood.      Barriers   Psychosocial barriers to participate in program The patient should benefit from training in stress management and  relaxation.      Screening Interventions   Interventions Encouraged to exercise;To provide support and resources with identified psychosocial needs;Provide feedback about the scores to participant    Expected Outcomes Short Term goal: Utilizing psychosocial counselor, staff and physician to assist with identification of specific Stressors or current issues interfering with healing process. Setting desired goal for each stressor or current issue identified.;Long Term Goal: Stressors or current issues are controlled or eliminated.;Short Term goal: Identification and review with participant of any Quality of Life or Depression concerns found by scoring the questionnaire.;Long Term goal: The participant improves quality of Life and PHQ9 Scores as seen by post scores and/or verbalization of changes             Quality of Life Scores:  Scores of 19 and below usually indicate a poorer quality of life in these areas.  A difference of  2-3 points is a clinically meaningful difference.  A difference of 2-3 points in the total score of the Quality of Life Index has been associated with significant improvement in overall quality of life, self-image, physical symptoms, and general health in studies assessing change in quality of life.  PHQ-9: Review Flowsheet  More data exists      06/30/2023 01/14/2022 12/26/2020 12/21/2019 08/14/2019  Depression screen PHQ 2/9  Decreased Interest 1 0 0 0 0  Down, Depressed, Hopeless 0 0 0 0 0  PHQ - 2 Score 1 0 0 0 0  Altered sleeping 2 - - - -  Tired, decreased energy 2 - - - -  Change in appetite 0 - - - -  Feeling bad or failure about yourself  0 - - - -  Trouble concentrating 1 - - - -  Moving slowly or fidgety/restless 0 - - - -  Suicidal thoughts 0 - - - -  PHQ-9 Score 6 - - - -  Difficult doing work/chores Very difficult - - - -   Interpretation of Total Score  Total Score Depression Severity:  1-4 = Minimal depression, 5-9 = Mild depression, 10-14 =  Moderate depression, 15-19 = Moderately severe depression, 20-27 = Severe depression   Psychosocial Evaluation and Intervention:  Psychosocial Evaluation - 06/28/23 1030       Psychosocial Evaluation & Interventions   Interventions Relaxation education;Stress management education;Encouraged to exercise with the program and follow exercise prescription    Comments Keari lives by herself and can call her daughterif she needs support. Sometimes she has problems sleeping due to her shortness  of breath. She states no depression and does not take anything for her mood.    Expected Outcomes Short: Attend [facility] stress management education to decrease stress. Long: Maintain exercise Post [facility] to keep stress at a minimum.    Continue Psychosocial Services  Follow up required by staff             Psychosocial Re-Evaluation:   Psychosocial Discharge (Final Psychosocial Re-Evaluation):   Education: Education Goals: Education classes will be provided on a weekly basis, covering required topics. Participant will state understanding/return demonstration of topics presented.  Learning Barriers/Preferences:  Learning Barriers/Preferences - 06/28/23 1027       Learning Barriers/Preferences   Learning Barriers None    Learning Preferences None             General Pulmonary Education Topics:  Infection Prevention: - Provides verbal and written material to individual with discussion of infection control including proper hand washing and proper equipment cleaning during exercise session. Flowsheet Row Pulmonary Rehab from 06/30/2023 in [facility] Cardiac and Pulmonary Rehab  Date 06/30/23  Educator MB  Instruction Review Code 1- Verbalizes Understanding       Falls Prevention: - Provides verbal and written material to individual with discussion of falls prevention and safety. Flowsheet Row Pulmonary Rehab from 06/30/2023 in [facility] Cardiac and Pulmonary Rehab  Date 06/30/23  Educator  MB  Instruction Review Code 1- Verbalizes Understanding       Chronic Lung Disease Review: - Group verbal instruction with posters, models, PowerPoint presentations and videos,  to review new updates, new respiratory medications, new advancements in procedures and treatments. Providing information on websites and "800" numbers for continued self-education. Includes information about supplement oxygen, available portable oxygen systems, continuous and intermittent flow rates, oxygen safety, concentrators, and Medicare reimbursement for oxygen. Explanation of Pulmonary Drugs, including class, frequency, complications, importance of spacers, rinsing mouth after steroid MDI's, and proper cleaning methods for nebulizers. Review of basic lung anatomy and physiology related to function, structure, and complications of lung disease. Review of risk factors. Discussion about methods for diagnosing sleep apnea and types of masks and machines for OSA. Includes a review of the use of types of environmental controls: home humidity, furnaces, filters, dust mite/pet prevention, HEPA vacuums. Discussion about weather changes, air quality and the benefits of nasal washing. Instruction on Warning signs, infection symptoms, calling MD promptly, preventive modes, and value of vaccinations. Review of effective airway clearance, coughing and/or vibration techniques. Emphasizing that all should Create an Action Plan. Written material given at graduation.   AED/CPR: - Group verbal and written instruction with the use of models to demonstrate the basic use of the AED with the basic ABC's of resuscitation.    Anatomy and Cardiac Procedures: - Group verbal and visual presentation and models provide information about basic cardiac anatomy and function. Reviews the testing methods done to diagnose heart disease and the outcomes of the test results. Describes the treatment choices: Medical Management, Angioplasty, or Coronary  Bypass Surgery for treating various heart conditions including Myocardial Infarction, Angina, Valve Disease, and Cardiac Arrhythmias.  Written material given at graduation.   Medication Safety: - Group verbal and visual instruction to review commonly prescribed medications for heart and lung disease. Reviews the medication, class of the drug, and side effects. Includes the steps to properly store meds and maintain the prescription regimen.  Written material given at graduation.   Other: -Provides group and verbal instruction on various topics (see comments)   Knowledge Questionnaire Score:  Knowledge Questionnaire Score - 07/07/23 1349       Knowledge Questionnaire Score   Pre Score 14/18              Core Components/Risk Factors/Patient Goals at Admission:  Personal Goals and Risk Factors at Admission - 06/30/23 1622       Core Components/Risk Factors/Patient Goals on Admission    Weight Management Yes;Weight Loss    Intervention Weight Management: Develop a combined nutrition and exercise program designed to reach desired caloric intake, while maintaining appropriate intake of nutrient and fiber, sodium and fats, and appropriate energy expenditure required for the weight goal.;Weight Management: Provide education and appropriate resources to help participant work on and attain dietary goals.;Weight Management/Obesity: Establish reasonable short term and long term weight goals.;Obesity: Provide education and appropriate resources to help participant work on and attain dietary goals.    Admit Weight 165 lb 12.8 oz (75.2 kg)    Goal Weight: Short Term 158 lb 12.8 oz (72 kg)    Goal Weight: Long Term 151 lb 12.8 oz (68.9 kg)    Expected Outcomes Short Term: Continue to assess and modify interventions until short term weight is achieved;Long Term: Adherence to nutrition and physical activity/exercise program aimed toward attainment of established weight goal;Weight Loss: Understanding  of general recommendations for a balanced deficit meal plan, which promotes 1-2 lb weight loss per week and includes a negative energy balance of [phone] kcal/d;Understanding recommendations for meals to include 15-35% energy as protein, 25-35% energy from fat, 35-60% energy from carbohydrates, less than 200mg  of dietary cholesterol, 20-35 gm of total fiber daily;Understanding of distribution of calorie intake throughout the day with the consumption of 4-5 meals/snacks    Improve shortness of breath with ADL's Yes    Intervention Provide education, individualized exercise plan and daily activity instruction to help decrease symptoms of SOB with activities of daily living.    Expected Outcomes Short Term: Improve cardiorespiratory fitness to achieve a reduction of symptoms when performing ADLs;Long Term: Be able to perform more ADLs without symptoms or delay the onset of symptoms    Hypertension Yes    Intervention Provide education on lifestyle modifcations including regular physical activity/exercise, weight management, moderate sodium restriction and increased consumption of fresh fruit, vegetables, and low fat dairy, alcohol moderation, and smoking cessation.;Monitor prescription use compliance.    Expected Outcomes Short Term: Continued assessment and intervention until BP is < 140/71mm HG in hypertensive participants. < 130/54mm HG in hypertensive participants with diabetes, heart failure or chronic kidney disease.;Long Term: Maintenance of blood pressure at goal levels.    Lipids Yes    Intervention Provide education and support for participant on nutrition & aerobic/resistive exercise along with prescribed medications to achieve LDL 70mg , HDL >40mg .    Expected Outcomes Short Term: Participant states understanding of desired cholesterol values and is compliant with medications prescribed. Participant is following exercise prescription and nutrition guidelines.;Long Term: Cholesterol controlled with  medications as prescribed, with individualized exercise RX and with personalized nutrition plan. Value goals: LDL < 70mg , HDL > 40 mg.             Education:Diabetes - Individual verbal and written instruction to review signs/symptoms of diabetes, desired ranges of glucose level fasting, after meals and with exercise. Acknowledge that pre and post exercise glucose checks will be done for 3 sessions at entry of program.   Know Your Numbers and Heart Failure: - Group verbal and visual instruction to discuss disease risk factors for cardiac  and pulmonary disease and treatment options.  Reviews associated critical values for Overweight/Obesity, Hypertension, Cholesterol, and Diabetes.  Discusses basics of heart failure: signs/symptoms and treatments.  Introduces Heart Failure Zone chart for action plan for heart failure.  Written material given at graduation.   Core Components/Risk Factors/Patient Goals Review:    Core Components/Risk Factors/Patient Goals at Discharge (Final Review):    ITP Comments:  ITP Comments     Row Name 06/28/23 1025 06/30/23 1614 07/07/23 1138 07/14/23 0981     ITP Comments Virtual Visit completed. Patient informed on EP and RD appointment and 6 Minute walk test. Patient also informed of patient health questionnaires on My Chart. Patient Verbalizes understanding. Visit diagnosis can be found in [facility] 06/09/2023. Completed and gym orientation. Initial ITP created and sent for review to Dr. Jinny Sanders, Medical Director. First full day of exercise!  Patient was oriented to gym and equipment including functions, settings, policies, and procedures.  Patient's individual exercise prescription and treatment plan were reviewed.  All starting workloads were established based on the results of the 6 minute walk test done at initial orientation visit.  The plan for exercise progression was also introduced and progression will be customized based on patient's performance and  goals. 30 Day review completed. Medical Director ITP review done, changes made as directed, and signed approval by Medical Director. Newer to Program             Comments: 30 day review

## 2023-07-15 ENCOUNTER — Other Ambulatory Visit: Payer: Self-pay | Admitting: Nurse Practitioner

## 2023-07-19 ENCOUNTER — Encounter: Payer: MEDICARE | Admitting: *Deleted

## 2023-07-19 DIAGNOSIS — R0602 Shortness of breath: Secondary | ICD-10-CM

## 2023-07-19 NOTE — Progress Notes (Signed)
Daily Session Note  Patient Details  Name: Jalesha Plotz Blauvelt MRN: [id number] Date of Birth: 02/03/1946 Referring Provider:   Flowsheet Row Pulmonary Rehab from 06/30/2023 in [facility] Cardiac and Pulmonary Rehab  Referring Provider Janann Colonel, MD       Encounter Date: 07/19/2023  Check In:  Session Check In - 07/19/23 1136       Check-In   Supervising physician immediately available to respond to emergencies See telemetry face sheet for immediately available ER MD    Location [facility]-Cardiac & Pulmonary Rehab    Staff Present Cora Collum, RN, BSN, CCRP;Meredith Jewel Baize RN,BSN;Kelly Minneiska BS, ACSM CEP, Exercise Physiologist;Maxon Conetta BS, Exercise Physiologist    Virtual Visit No    Medication changes reported     No    Fall or balance concerns reported    No    Warm-up and Cool-down Performed on first and last piece of equipment    Resistance Training Performed Yes    VAD Patient? No    PAD/SET Patient? No      Pain Assessment   Currently in Pain? No/denies                Social History   Tobacco Use  Smoking Status Former   Current packs/day: 0.00   Average packs/day: 0.5 packs/day for 30.0 years (15.0 ttl pk-yrs)   Types: Cigarettes   Start date: 01/01/1968   Quit date: 12/31/1997   Years since quitting: 25.5  Smokeless Tobacco Never  Tobacco Comments   Quit in 1999    Goals Met:  Proper associated with RPD/PD & O2 Sat Independence with exercise equipment Exercise tolerated well No report of concerns or symptoms today  Goals Unmet:  Not Applicable  Comments: Pt able to follow exercise prescription today without complaint.  Will continue to monitor for progression.    Dr. Bethann Punches is Medical Director for [facility] Cardiac Rehabilitation.  Dr. Vida Rigger is Medical Director for [facility] Pulmonary Rehabilitation.

## 2023-07-26 ENCOUNTER — Encounter: Payer: MEDICARE | Admitting: *Deleted

## 2023-07-26 DIAGNOSIS — R0602 Shortness of breath: Secondary | ICD-10-CM | POA: Diagnosis not present

## 2023-07-26 NOTE — Progress Notes (Signed)
 Daily Session Note  Patient Details  Name: Deanna [name] MRN: [id number] Date of Birth: Mar 23, 1946 Referring Provider:   Flowsheet Row Pulmonary Rehab from 06/30/2023 in [facility] Cardiac and Pulmonary Rehab  Referring Provider Janann Colonel, MD       Encounter Date: 07/26/2023  Check In:  Session Check In - 07/26/23 1121       Check-In   Supervising physician immediately available to respond to emergencies See telemetry face sheet for immediately available ER MD    Location [facility]-Cardiac & Pulmonary Rehab    Staff Present Cora Collum, RN, BSN, CCRP;Margaret Best, MS, Exercise Physiologist;Maxon Conetta BS, Exercise Physiologist;Kelly Cloretta Ned, ACSM CEP, Exercise Physiologist    Virtual Visit No    Medication changes reported     No    Fall or balance concerns reported    No    Warm-up and Cool-down Performed on first and last piece of equipment    Resistance Training Performed Yes    VAD Patient? No    PAD/SET Patient? No      Pain Assessment   Currently in Pain? No/denies                Social History   Tobacco Use  Smoking Status Former   Current packs/day: 0.00   Average packs/day: 0.5 packs/day for 30.0 years (15.0 ttl pk-yrs)   Types: Cigarettes   Start date: 01/01/1968   Quit date: 12/31/1997   Years since quitting: 25.5  Smokeless Tobacco Never  Tobacco Comments   Quit in 1999    Goals Met:  Proper associated with RPD/PD & O2 Sat Independence with exercise equipment Exercise tolerated well No report of concerns or symptoms today  Goals Unmet:  Not Applicable  Comments: Pt able to follow exercise prescription today without complaint.  Will continue to monitor for progression.    Dr. Bethann Punches is Medical Director for [facility] Cardiac Rehabilitation.  Dr. Vida Rigger is Medical Director for [facility] Pulmonary Rehabilitation.

## 2023-07-28 ENCOUNTER — Encounter: Payer: MEDICARE | Admitting: *Deleted

## 2023-07-28 DIAGNOSIS — R0602 Shortness of breath: Secondary | ICD-10-CM | POA: Diagnosis not present

## 2023-07-28 NOTE — Progress Notes (Signed)
 Daily Session Note  Patient Details  Name: Deanna [name] MRN: [id number] Date of Birth: 1945-12-17 Referring Provider:   Flowsheet Row Pulmonary Rehab from 06/30/2023 in [facility] Cardiac and Pulmonary Rehab  Referring Provider Janann Colonel, MD       Encounter Date: 07/28/2023  Check In:  Session Check In - 07/28/23 1135       Check-In   Supervising physician immediately available to respond to emergencies See telemetry face sheet for immediately available ER MD    Location [facility]-Cardiac & Pulmonary Rehab    Staff Present Susann Givens RN,BSN;Joseph [facility] Best, MS, Exercise Physiologist    Virtual Visit No    Medication changes reported     No    Fall or balance concerns reported    No    Warm-up and Cool-down Performed on first and last piece of equipment    Resistance Training Performed Yes    VAD Patient? No      Pain Assessment   Currently in Pain? No/denies                Social History   Tobacco Use  Smoking Status Former   Current packs/day: 0.00   Average packs/day: 0.5 packs/day for 30.0 years (15.0 ttl pk-yrs)   Types: Cigarettes   Start date: 01/01/1968   Quit date: 12/31/1997   Years since quitting: 25.5  Smokeless Tobacco Never  Tobacco Comments   Quit in 1999    Goals Met:  Independence with exercise equipment Exercise tolerated well No report of concerns or symptoms today Strength training completed today  Goals Unmet:  Not Applicable  Comments: Pt able to follow exercise prescription today without complaint.  Will continue to monitor for progression.    Dr. Bethann Punches is Medical Director for [facility] Cardiac Rehabilitation.  Dr. Vida Rigger is Medical Director for [facility] Pulmonary Rehabilitation.

## 2023-08-02 ENCOUNTER — Encounter: Payer: MEDICARE | Attending: Pulmonary Disease | Admitting: *Deleted

## 2023-08-02 DIAGNOSIS — R0602 Shortness of breath: Secondary | ICD-10-CM | POA: Diagnosis present

## 2023-08-02 NOTE — Progress Notes (Signed)
 Daily Session Note  Patient Details  Name: Deanna [name] MRN: [id number] Date of Birth: 1946/05/04 Referring Provider:   Flowsheet Row Pulmonary Rehab from 06/30/2023 in [facility] Cardiac and Pulmonary Rehab  Referring Provider Janann Colonel, MD       Encounter Date: 08/02/2023  Check In:  Session Check In - 08/02/23 1121       Check-In   Supervising physician immediately available to respond to emergencies See telemetry face sheet for immediately available ER MD    Location [facility]-Cardiac & Pulmonary Rehab    Staff Present Susann Givens RN,BSN;Joseph [facility] Madilyn Fireman BS, ACSM CEP, Exercise Physiologist;Margaret Best, MS, Exercise Physiologist    Virtual Visit No    Medication changes reported     No    Fall or balance concerns reported    No    Warm-up and Cool-down Performed on first and last piece of equipment    Resistance Training Performed Yes    VAD Patient? No    PAD/SET Patient? No      Pain Assessment   Currently in Pain? No/denies                Social History   Tobacco Use  Smoking Status Former   Current packs/day: 0.00   Average packs/day: 0.5 packs/day for 30.0 years (15.0 ttl pk-yrs)   Types: Cigarettes   Start date: 01/01/1968   Quit date: 12/31/1997   Years since quitting: 25.6  Smokeless Tobacco Never  Tobacco Comments   Quit in 1999    Goals Met:  Independence with exercise equipment Exercise tolerated well No report of concerns or symptoms today Strength training completed today  Goals Unmet:  Not Applicable  Comments: Pt able to follow exercise prescription today without complaint.  Will continue to monitor for progression.    Dr. Bethann Punches is Medical Director for [facility] Cardiac Rehabilitation.  Dr. Vida Rigger is Medical Director for [facility] Pulmonary Rehabilitation.

## 2023-08-09 ENCOUNTER — Encounter: Payer: MEDICARE | Admitting: *Deleted

## 2023-08-09 DIAGNOSIS — R0602 Shortness of breath: Secondary | ICD-10-CM | POA: Diagnosis not present

## 2023-08-09 NOTE — Progress Notes (Signed)
 Daily Session Note  Patient Details  Name: Deanna [name] MRN: [id number] Date of Birth: 08-21-1945 Referring Provider:   Flowsheet Row Pulmonary Rehab from 06/30/2023 in [facility] Cardiac and Pulmonary Rehab  Referring Provider Janann Colonel, MD       Encounter Date: 08/09/2023  Check In:  Session Check In - 08/09/23 1139       Check-In   Supervising physician immediately available to respond to emergencies See telemetry face sheet for immediately available ER MD    Location [facility]-Cardiac & Pulmonary Rehab    Staff Present Cora Collum, RN, BSN, CCRP;Margaret Best, MS, Exercise Physiologist;Kelly Cloretta Ned, ACSM CEP, Exercise Physiologist;Meredith Jewel Baize RN,BSN    Virtual Visit No    Medication changes reported     No    Fall or balance concerns reported    No    Warm-up and Cool-down Performed on first and last piece of equipment    Resistance Training Performed Yes    VAD Patient? No    PAD/SET Patient? No      Pain Assessment   Currently in Pain? No/denies                Social History   Tobacco Use  Smoking Status Former   Current packs/day: 0.00   Average packs/day: 0.5 packs/day for 30.0 years (15.0 ttl pk-yrs)   Types: Cigarettes   Start date: 01/01/1968   Quit date: 12/31/1997   Years since quitting: 25.6  Smokeless Tobacco Never  Tobacco Comments   Quit in 1999    Goals Met:  Proper associated with RPD/PD & O2 Sat Independence with exercise equipment Exercise tolerated well No report of concerns or symptoms today  Goals Unmet:  Not Applicable  Comments: Pt able to follow exercise prescription today without complaint.  Will continue to monitor for progression.    Dr. Bethann Punches is Medical Director for [facility] Cardiac Rehabilitation.  Dr. Vida Rigger is Medical Director for [facility] Pulmonary Rehabilitation.

## 2023-08-11 ENCOUNTER — Encounter: Payer: MEDICARE | Admitting: *Deleted

## 2023-08-11 DIAGNOSIS — R0602 Shortness of breath: Secondary | ICD-10-CM

## 2023-08-11 NOTE — Progress Notes (Signed)
 Daily Session Note  Patient Details  Name: Deanna [name] MRN: [id number] Date of Birth: September 15, 1945 Referring Provider:   Flowsheet Row Pulmonary Rehab from 06/30/2023 in [facility] Cardiac and Pulmonary Rehab  Referring Provider Janann Colonel, MD       Encounter Date: 08/11/2023  Check In:  Session Check In - 08/11/23 1159       Check-In   Supervising physician immediately available to respond to emergencies See telemetry face sheet for immediately available ER MD    Location [facility]-Cardiac & Pulmonary Rehab    Staff Present Cora Collum, RN, BSN, CCRP;Maxon Conetta BS, Exercise Physiologist;Joseph Hood RCP,RRT,BSRT;Noah Tickle, BS, Exercise Physiologist    Virtual Visit No    Medication changes reported     No    Fall or balance concerns reported    No    Warm-up and Cool-down Performed on first and last piece of equipment    Resistance Training Performed Yes    VAD Patient? No    PAD/SET Patient? No      Pain Assessment   Currently in Pain? No/denies                Social History   Tobacco Use  Smoking Status Former   Current packs/day: 0.00   Average packs/day: 0.5 packs/day for 30.0 years (15.0 ttl pk-yrs)   Types: Cigarettes   Start date: 01/01/1968   Quit date: 12/31/1997   Years since quitting: 25.6  Smokeless Tobacco Never  Tobacco Comments   Quit in 1999    Goals Met:  Proper associated with RPD/PD & O2 Sat Independence with exercise equipment Exercise tolerated well No report of concerns or symptoms today  Goals Unmet:  Not Applicable  Comments: Pt able to follow exercise prescription today without complaint.  Will continue to monitor for progression.    Dr. Bethann Punches is Medical Director for [facility] Cardiac Rehabilitation.  Dr. Vida Rigger is Medical Director for [facility] Pulmonary Rehabilitation.

## 2023-08-11 NOTE — Progress Notes (Signed)
 Pulmonary Individual Treatment Plan  Patient Details  Name: Deanna [name] MRN: [id number] Date of Birth: [birth year] Referring Provider:   Flowsheet Row Pulmonary Rehab from 06/30/2023 in [facility] Cardiac and Pulmonary Rehab  Referring Provider Assaker, [place], MD       Initial Encounter Date:  Flowsheet Row Pulmonary Rehab from 06/30/2023 in [facility] Cardiac and Pulmonary Rehab  Date 06/30/23       Visit Diagnosis: SOB (shortness of breath)  Patient's Home Medications on Admission:  Current Outpatient Medications:    albuterol (VENTOLIN HFA) 108 (90 Base) MCG/ACT inhaler, Inhale 2 puffs into the lungs every 6 (six) hours as needed for wheezing or shortness of breath., Disp: 8 g, Rfl: 2   amoxicillin-clavulanate (AUGMENTIN) 875-125 MG tablet, Take 1 tablet by mouth every 12 (twelve) hours. (Patient not taking: Reported on 06/28/2023), Disp: 14 tablet, Rfl: 0   apixaban (ELIQUIS) 5 MG TABS tablet, Take 1 tablet (5 mg total) by mouth 2 (two) times daily. Hold medication from 9/15 to 9/21 for right hip surgery, Disp: 60 tablet, Rfl: 3   Ascorbic Acid (VITAMIN C) 1000 MG tablet, Take 4,000 mg by mouth daily., Disp: , Rfl:    azithromycin (ZITHROMAX) 250 MG tablet, Take 1 tablet (250 mg total) by mouth daily. Take first 2 tablets together, then 1 every day until finished. (Patient not taking: Reported on 06/28/2023), Disp: 6 tablet, Rfl: 0   budesonide-formoterol (SYMBICORT) 160-4.5 MCG/ACT inhaler, Inhale 2 puffs into the lungs in the morning and at bedtime., Disp: 1 each, Rfl: 12   Calcium-Magnesium (CAL-MAG PO), Take 2,000 mg by mouth at bedtime. 1000 mg, Disp: , Rfl:    Carboxymethylcellul-Glycerin (CLEAR EYES FOR DRY EYES OP), Place 1 drop into both eyes daily., Disp: , Rfl:    [employer] MAGNESIUM OXIDE 250 MG TABS, TAKE 1 TABLET (250 MG TOTAL) BY MOUTH EVERY EVENING. TAKE WITH EVENING MEAL (Patient taking differently: Take 250 mg by mouth daily.), Disp: 100 tablet, Rfl: 1   diltiazem (CARDIZEM  SR) 120 MG 12 hr capsule, Take 1 capsule (120 mg total) by mouth every 12 (twelve) hours., Disp: 60 capsule, Rfl: 6   furosemide (LASIX) 20 MG tablet, Take 20 mg by mouth as needed. (Patient not taking: Reported on 06/09/2023), Disp: , Rfl:    hydrochlorothiazide (HYDRODIURIL) 25 MG tablet, Take 1 tablet (25 mg total) by mouth 2 (two) times daily. (Patient taking differently: Take 1 tablet by mouth daily.), Disp: 180 tablet, Rfl: 3   HYDROcodone-acetaminophen (NORCO/VICODIN) 5-325 MG tablet, Take 1-2 tablets by mouth every 4 (four) hours as needed for severe pain. (Patient not taking: Reported on 06/28/2023), Disp: 42 tablet, Rfl: 0   loratadine (CLARITIN) 10 MG tablet, Take 10 mg by mouth daily as needed for allergies., Disp: , Rfl:    losartan (COZAAR) 50 MG tablet, TAKE 1 TABLET BY MOUTH EVERY DAY, Disp: 90 tablet, Rfl: 1   methocarbamol (ROBAXIN) 500 MG tablet, Take 1 tablet (500 mg total) by mouth every 6 (six) hours as needed for muscle spasms (muscle pain). (Patient not taking: Reported on 06/28/2023), Disp: 40 tablet, Rfl: 2   metoprolol tartrate (LOPRESSOR) 25 MG tablet, Take 1 tablet (25 mg total) by mouth 2 (two) times daily., Disp: 60 tablet, Rfl: 6   milk thistle 175 MG tablet, Take 175 mg by mouth daily., Disp: , Rfl:    nitroGLYCERIN (NITROSTAT) 0.4 MG SL tablet, Place 1 tablet (0.4 mg total) under the tongue every 5 (five) minutes x 3 doses as needed  for chest pain., Disp: 25 tablet, Rfl: 1   polyethylene glycol (MIRALAX / GLYCOLAX) 17 g packet, Take 17 g by mouth 2 (two) times daily. (Patient not taking: Reported on 06/28/2023), Disp: 14 each, Rfl: 0   Probiotic Product (PROBIOTIC DAILY PO), Take 1 tablet by mouth daily., Disp: , Rfl:    rosuvastatin (CRESTOR) 20 MG tablet, Take 1 tablet (20 mg total) by mouth daily., Disp: 30 tablet, Rfl: 6   umeclidinium bromide (INCRUSE ELLIPTA) 62.5 MCG/ACT AEPB, Inhale 1 puff into the lungs daily. (Patient not taking: Reported on 06/28/2023), Disp: 1  each, Rfl: 6   Vitamin A 2400 MCG (8000 UT) CAPS, Take 16,000 Units by mouth daily. 8000 units, Disp: , Rfl:    Vitamin D, Ergocalciferol, (DRISDOL) 1.25 MG (50000 UNIT) CAPS capsule, Take 50,000 Units by mouth once a week., Disp: , Rfl:   Past Medical History: Past Medical History:  Diagnosis Date   A-fib (HCC)    Abnormal glucose 03/06/2020   ACS (acute coronary syndrome) (HCC)    Age-related osteoporosis without current pathological fracture 11/22/2015   Overview:  Femoral neck  Formatting of this note might be different from the original. 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 Added automatically from request for surgery [id number]   Chronic pain syndrome 04/01/2015   Colon polyps    Dyspnea    Essential hypertension 07/20/2000   Overview:  HBP   Family history of adverse reaction to anesthesia    mother delerium   Female cystocele 02/27/2009   GAD (generalized anxiety disorder) 08/19/2015   GERD (gastroesophageal reflux disease)    Heart murmur    History of Bell's palsy 03/21/2015   Hx of adenomatous colonic polyps 12/20/2008   Overview:  Colonoscopy 07/2003 - [facility] - polyps removed - recommended repeat in 3 years Colonoscopy 09/2011 - Dr. Darrol Jump - hyperplastic polyp(s) were removed - repeat in 5 years   Hypertension    Incomplete uterovaginal prolapse 03/04/2004   Insomnia    Left bundle-branch block 05/20/2006   Lumbar radiculopathy 01/09/2016   Formatting of this note might be different from the original. 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 Femoral neck   Other intervertebral disc degeneration, lumbar region 01/29/2015   Pre-operative cardiovascular examination 09/03/2020   Primary osteoarthritis, right hand 02/28/2002   Overview:  OA HANDS, ESP THUMBS   Radial tunnel syndrome, right 11/04/2016   Radiculopathy, lumbar region 11/22/2014   Overview:  Added automatically from request for surgery [id number]   Rhinitis, allergic 02/24/2002   Overview:  ALLERGY SYMPTOMS - states was seen by allergist in past and informed allergic to dust   S/P left total hip arthroplasty 09/17/2020   Sacroiliitis (HCC) 08/02/2019   Screening for colon cancer 04/17/2005   Formatting of this note might be different from the original. 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 TRIGGER THUMBS - started spring 2003. Injected 02/28/02   Unspecified asthma, uncomplicated 12/25/2004    Tobacco Use: Social History   Tobacco Use  Smoking Status Former   Current packs/day: 0.00   Average packs/day: 0.5 packs/day for 30.0 years (15.0 ttl pk-yrs)   Types: Cigarettes   Start date: 01/01/1968   Quit date: 12/31/1997   Years since quitting: 25.6  Smokeless Tobacco Never  Tobacco Comments   Quit in 1999    Labs: Review Flowsheet  More data exists      Latest Ref Rng & Units 01/23/2021 06/26/2021 11/04/2021 02/10/2023 02/11/2023  Labs for ITP Cardiac and Pulmonary Rehab  Cholestrol 0 - 200 mg/dL - - 865  784  -  LDL (calc) 0 - 99  mg/dL - - 89  96  -  HDL-C >69 mg/dL - - 54  42  -  Trlycerides <150 mg/dL - - 47  629  -  Hemoglobin A1c 4.8 - 5.6 % 6.1  6.0  5.8  - 6.0      Pulmonary Assessment Scores:  Pulmonary Assessment Scores     Row Name 07/07/23 1348         ADL UCSD   ADL Phase Entry     SOB Score total 76     Rest 1     Walk 4     Stairs 5     Bath 2     Dress 3     Shop 3       CAT Score   CAT Score 21              UCSD: Self-administered rating of dyspnea associated with activities of daily living (ADLs) 6-point scale (0 = "not at all" to 5 = "maximal or unable to do because of breathlessness")  Scoring Scores range from 0 to 120.  Minimally important difference is 5 units  CAT: CAT can identify the health impairment of COPD patients and is better correlated with disease progression.  CAT has a scoring range of zero to 40. The CAT score is classified into four groups of low (less than 10), medium (10 - 20), high (21-30) and very high (31-40) based on the impact level of disease on health status. A CAT score over 10 suggests significant symptoms.  A worsening CAT score could be explained by an exacerbation, poor medication adherence, poor inhaler technique, or progression of COPD or comorbid conditions.  CAT MCID is 2 points  mMRC: mMRC (Modified Medical Research Council) Dyspnea Scale is used to assess the degree of baseline functional disability in patients of respiratory disease due to dyspnea. No minimal important difference is established. A decrease in score of 1 point or greater is considered a positive change.   Pulmonary Function Assessment:  Pulmonary Function Assessment - 06/28/23 1027       Breath   Shortness of Breath Yes;Limiting activity;Fear of Shortness of Breath             Exercise Target Goals: Exercise Program Goal: Individual exercise prescription set using results from initial 6 min walk test and THRR while considering  patient's activity barriers and  safety.  Exercise Prescription Goal: Initial exercise prescription builds to 30-45 minutes a day of aerobic activity, 2-3 days per week.  Home exercise guidelines will be given to patient during program as part of exercise prescription that the participant will acknowledge.  Education: Aerobic Exercise: - Group verbal and visual presentation on the components of exercise prescription. Introduces F.I.T.T principle from ACSM for exercise prescriptions.  Reviews F.I.T.T. principles of aerobic exercise including progression. Written material given at graduation.   Education: Resistance Exercise: - Group verbal and visual presentation on the components of exercise prescription. Introduces F.I.T.T principle from ACSM for exercise prescriptions  Reviews F.I.T.T. principles of resistance exercise including progression. Written material given at graduation.    Education: Exercise & Equipment Safety: - Individual verbal instruction and demonstration of equipment use and safety with use of the equipment. Flowsheet Row Pulmonary Rehab from 06/30/2023 in [facility] Cardiac and Pulmonary Rehab  Date 06/30/23  Educator MB  Instruction Review Code 1- Verbalizes Understanding       Education: Exercise Physiology & General Exercise Guidelines: - Group verbal and written instruction with models to review the exercise physiology of the cardiovascular system and associated critical values. Provides general exercise guidelines with specific guidelines to those with heart or lung disease.    Education: Flexibility, Balance, Mind/Body Relaxation: - Group verbal and visual presentation with interactive activity on the components of exercise prescription. Introduces F.I.T.T principle from ACSM for exercise prescriptions. Reviews F.I.T.T. principles of flexibility and balance exercise training including progression. Also discusses the mind body connection.  Reviews various relaxation techniques to help reduce and manage  stress (i.e. Deep breathing, progressive muscle relaxation, and visualization). Balance handout provided to take home. Written material given at graduation.   Activity Barriers & Risk Stratification:  Activity Barriers & Cardiac Risk Stratification - 06/30/23 1617       Activity Barriers & Cardiac Risk Stratification   Activity Barriers Arthritis;Left Hip Replacement;Right Hip Replacement;Back Problems;Other (comment);Shortness of Breath    Comments R patella fracture             6 Minute Walk:  6 Minute Walk     Row Name 06/30/23 1615         6 Minute Walk   Phase Initial     Distance 600 feet     Walk Time 5.5 minutes     # of Rest Breaks 1     MPH 1.24     METS 1.38     RPE 15     Perceived Dyspnea  3     VO2 Peak 4.83     Symptoms Yes (comment)     Comments SOB     Resting HR 73 bpm     Resting BP 140/60     Resting Oxygen Saturation  92 %     Exercise Oxygen Saturation  during 6 min walk 92 %     Max Ex. HR 96 bpm     Max Ex. BP 160/66     2 Minute Post BP 138/70       Interval HR   1 Minute HR 86     2 Minute HR 91     3 Minute HR 93     4 Minute HR 93     5 Minute HR 96     6 Minute HR 93     2 Minute Post HR 77     Interval Heart Rate? Yes       Interval Oxygen  Interval Oxygen? Yes     Baseline Oxygen Saturation % 92 %     1 Minute Oxygen Saturation % 93 %     1 Minute Liters of Oxygen 0 L     2 Minute Oxygen Saturation % 92 %     2 Minute Liters of Oxygen 0 L     3 Minute Oxygen Saturation % 93 %     3 Minute Liters of Oxygen 0 L     4 Minute Oxygen Saturation % 93 %     4 Minute Liters of Oxygen 0 L     5 Minute Oxygen Saturation % 92 %     5 Minute Liters of Oxygen 0 L     6 Minute Oxygen Saturation % 92 %     6 Minute Liters of Oxygen 0 L     2 Minute Post Oxygen Saturation % 94 %     2 Minute Post Liters of Oxygen 0 L             Oxygen Initial Assessment:  Oxygen Initial Assessment - 06/28/23 1026       Home Oxygen    Home Oxygen Device None    Sleep Oxygen Prescription None    Home Exercise Oxygen Prescription None    Home Resting Oxygen Prescription None      Initial 6 min Walk   Oxygen Used None      Program Oxygen Prescription   Program Oxygen Prescription None      Intervention   Short Term Goals To learn and exhibit compliance with exercise, home and travel O2 prescription;To learn and understand importance of monitoring SPO2 with pulse oximeter and demonstrate accurate use of the pulse oximeter.;To learn and understand importance of maintaining oxygen saturations>88%;To learn and demonstrate proper pursed lip breathing techniques or other breathing techniques. ;To learn and demonstrate proper use of respiratory medications    Long  Term Goals Exhibits compliance with exercise, home  and travel O2 prescription;Verbalizes importance of monitoring SPO2 with pulse oximeter and return demonstration;Maintenance of O2 saturations>88%;Exhibits proper breathing techniques, such as pursed lip breathing or other method taught during program session;Compliance with respiratory medication;Demonstrates proper use of MDI's             Oxygen Re-Evaluation:  Oxygen Re-Evaluation     Row Name 07/07/23 1137 08/09/23 1124           Program Oxygen Prescription   Program Oxygen Prescription None None        Home Oxygen   Home Oxygen Device None None      Sleep Oxygen Prescription None None      Home Exercise Oxygen Prescription None None      Home Resting Oxygen Prescription None None      Compliance with Home Oxygen Use Yes Yes        Goals/Expected Outcomes   Short Term Goals To learn and demonstrate proper pursed lip breathing techniques or other breathing techniques.  To learn and demonstrate proper pursed lip breathing techniques or other breathing techniques.       Long  Term Goals Exhibits proper breathing techniques, such as pursed lip breathing or other method taught during program session  Exhibits proper breathing techniques, such as pursed lip breathing or other method taught during program session      Comments Reviewed PLB technique with pt.  Talked about how it works and it's importance in maintaining their exercise saturations. --  Goals/Expected Outcomes Short: Become more profiecient at using PLB. Long: Become independent at using PLB. --               Oxygen Discharge (Final Oxygen Re-Evaluation):  Oxygen Re-Evaluation - 08/09/23 1124       Program Oxygen Prescription   Program Oxygen Prescription None      Home Oxygen   Home Oxygen Device None    Sleep Oxygen Prescription None    Home Exercise Oxygen Prescription None    Home Resting Oxygen Prescription None    Compliance with Home Oxygen Use Yes      Goals/Expected Outcomes   Short Term Goals To learn and demonstrate proper pursed lip breathing techniques or other breathing techniques.     Long  Term Goals Exhibits proper breathing techniques, such as pursed lip breathing or other method taught during program session             Initial Exercise Prescription:  Initial Exercise Prescription - 06/30/23 1600       Date of Initial Exercise RX and Referring Provider   Date 06/30/23    Referring Provider Assaker, [place], MD      Oxygen   Maintain Oxygen Saturation 88% or higher      NuStep   Level 1    SPM 80    Minutes 15    METs 1.38      Arm Ergometer   Level 1    RPM 30    Minutes 15    METs 1.38      Biostep-RELP   Level 1    SPM 50    Minutes 15    METs 1.38      Track   Laps 9    Minutes 15    METs 1.49      Prescription Details   Frequency (times per week) 2    Duration Progress to 30 minutes of continuous aerobic without signs/symptoms of physical distress      Intensity   THRR 40-80% of Max Heartrate 101-129    Ratings of Perceived Exertion 11-13    Perceived Dyspnea 0-4      Progression   Progression Continue to progress workloads to maintain  intensity without signs/symptoms of physical distress.      Resistance Training   Training Prescription Yes    Weight 2 lb upper, 4 lb lower    Reps 10-15             Perform Capillary Blood Glucose checks as needed.  Exercise Prescription Changes:   Exercise Prescription Changes     Row Name 06/30/23 1600 07/21/23 1100 08/05/23 0800         Response to Exercise   Blood Pressure (Admit) 140/60 120/62 122/64     Blood Pressure (Exercise) 160/66 148/70 142/80     Blood Pressure (Exit) 138/70 118/60 118/62     Heart Rate (Admit) 73 bpm 80 bpm 78 bpm     Heart Rate (Exercise) 96 bpm 102 bpm 88 bpm     Heart Rate (Exit) 77 bpm 83 bpm 74 bpm     Oxygen Saturation (Admit) 92 % 94 % 91 %     Oxygen Saturation (Exercise) 92 % 93 % 90 %     Oxygen Saturation (Exit) 94 % 96 % 95 %     Rating of Perceived Exertion (Exercise) 15 14 15      Perceived Dyspnea (Exercise) 3 4 3      Symptoms SOB  SOB SOB     Comments results First two days of exercise in rehab --     Duration Progress to 30 minutes of  aerobic without signs/symptoms of physical distress Continue with 30 min of aerobic exercise without signs/symptoms of physical distress. Continue with 30 min of aerobic exercise without signs/symptoms of physical distress.     Intensity THRR New THRR unchanged THRR unchanged       Progression   Progression Continue to progress workloads to maintain intensity without signs/symptoms of physical distress. Continue to progress workloads to maintain intensity without signs/symptoms of physical distress. Continue to progress workloads to maintain intensity without signs/symptoms of physical distress.     Average METs 1.38 1.83 1.9       Resistance Training   Training Prescription -- Yes Yes     Weight -- 2 lb 2 lb     Reps -- 10-15 10-15       Interval Training   Interval Training -- No No       Treadmill   MPH -- -- 1     Grade -- -- 0     Minutes -- -- 15     METs -- -- 1.77        NuStep   Level -- 1 4     Minutes -- 15 15     METs -- 2.7 2.4       T5 Nustep   Level -- 1 1  T6 nustep     Minutes -- 15 15     METs -- 1.8 1.7       Track   Laps -- 9 --     Minutes -- 15 --     METs -- 1.49 --       Oxygen   Maintain Oxygen Saturation -- 88% or higher 88% or higher              Exercise Comments:   Exercise Comments     Row Name 07/07/23 1138           Exercise Comments First full day of exercise!  Patient was oriented to gym and equipment including functions, settings, policies, and procedures.  Patient's individual exercise prescription and treatment plan were reviewed.  All starting workloads were established based on the results of the 6 minute walk test done at initial orientation visit.  The plan for exercise progression was also introduced and progression will be customized based on patient's performance and goals.                Exercise Goals and Review:   Exercise Goals     Row Name 06/30/23 1621             Exercise Goals   Increase Physical Activity Yes       Intervention Provide advice, education, support and counseling about physical activity/exercise needs.;Develop an individualized exercise prescription for aerobic and resistive training based on initial evaluation findings, risk stratification, comorbidities and participant's personal goals.       Expected Outcomes Short Term: Attend rehab on a regular basis to increase amount of physical activity.;Long Term: Add in home exercise to make exercise part of routine and to increase amount of physical activity.;Long Term: Exercising regularly at least 3-5 days a week.       Increase Strength and Stamina Yes       Intervention Provide advice, education, support and counseling about physical activity/exercise needs.;Develop an individualized exercise prescription for  aerobic and resistive training based on initial evaluation findings, risk stratification, comorbidities and  participant's personal goals.       Expected Outcomes Short Term: Increase workloads from initial exercise prescription for resistance, speed, and METs.;Short Term: Perform resistance training exercises routinely during rehab and add in resistance training at home;Long Term: Improve cardiorespiratory fitness, muscular endurance and strength as measured by increased METs and functional capacity ( )       Able to understand and use rate of perceived exertion (RPE) scale Yes       Intervention Provide education and explanation on how to use RPE scale       Expected Outcomes Short Term: Able to use RPE daily in rehab to express subjective intensity level;Long Term:  Able to use RPE to guide intensity level when exercising independently       Able to understand and use Dyspnea scale Yes       Intervention Provide education and explanation on how to use Dyspnea scale       Expected Outcomes Short Term: Able to use Dyspnea scale daily in rehab to express subjective sense of shortness of breath during exertion;Long Term: Able to use Dyspnea scale to guide intensity level when exercising independently       Knowledge and understanding of Target Heart Rate Range (THRR) Yes       Intervention Provide education and explanation of THRR including how the numbers were predicted and where they are located for reference       Expected Outcomes Short Term: Able to state/look up THRR;Short Term: Able to use daily as guideline for intensity in rehab;Long Term: Able to use THRR to govern intensity when exercising independently       Able to check pulse independently Yes       Intervention Provide education and demonstration on how to check pulse in carotid and radial arteries.;Review the importance of being able to check your own pulse for safety during independent exercise       Expected Outcomes Short Term: Able to explain why pulse checking is important during independent exercise;Long Term: Able to check pulse  independently and accurately       Understanding of Exercise Prescription Yes       Intervention Provide education, explanation, and written materials on patient's individual exercise prescription       Expected Outcomes Short Term: Able to explain program exercise prescription;Long Term: Able to explain home exercise prescription to exercise independently                Exercise Goals Re-Evaluation :  Exercise Goals Re-Evaluation     Row Name 07/07/23 1136 07/21/23 1136 08/05/23 0842 08/09/23 1122       Exercise Goal Re-Evaluation   Exercise Goals Review Able to understand and use rate of perceived exertion (RPE) scale;Able to understand and use Dyspnea scale;Knowledge and understanding of Target Heart Rate Range (THRR);Understanding of Exercise Prescription Increase Physical Activity;Increase Strength and Stamina;Understanding of Exercise Prescription Increase Physical Activity;Increase Strength and Stamina;Understanding of Exercise Prescription Increase Physical Activity;Increase Strength and Stamina;Understanding of Exercise Prescription    Comments Reviewed RPE and dyspnea scale, THR and program prescription with pt today.  Pt voiced understanding and was given a copy of goals to take home. Wallace Cullens is off to a good start in rehab. She was able to walk up to 9 laps on the track. She also did well at level 1 on the T4 and T5 nustep machines. We will continue to monitor  her progress in the program. Wallace Cullens is doing well in rehab. She began using the treadmill and did well at 1 mph with no incline. She also improved to level 4 on the T4 nustep and did well with 2 lb hand weights for resistance training. We will continue to monitor her progress in the program. Wallace Cullens is doing well here at rehab, reports she is not doing much at home. Encouraged her to increase workload here at rehab as able. She says some days she is exhausted after exercise and affects her ability to do daily task.    Expected Outcomes  Short: Use RPE daily to regulate intensity. Long: Follow program prescription in THR. Short: Continue to follow current exercise prescription. Long: Continue exercise to improve strength and stamina. Short: Continue to progressively increase workloads. Long: Continue exercise to improve strength and stamina. STG: increase workload as able. LTG: Continue exercise to improve strength and stamina             Discharge Exercise Prescription (Final Exercise Prescription Changes):  Exercise Prescription Changes - 08/05/23 0800       Response to Exercise   Blood Pressure (Admit) 122/64    Blood Pressure (Exercise) 142/80    Blood Pressure (Exit) 118/62    Heart Rate (Admit) 78 bpm    Heart Rate (Exercise) 88 bpm    Heart Rate (Exit) 74 bpm    Oxygen Saturation (Admit) 91 %    Oxygen Saturation (Exercise) 90 %    Oxygen Saturation (Exit) 95 %    Rating of Perceived Exertion (Exercise) 15    Perceived Dyspnea (Exercise) 3    Symptoms SOB    Duration Continue with 30 min of aerobic exercise without signs/symptoms of physical distress.    Intensity THRR unchanged      Progression   Progression Continue to progress workloads to maintain intensity without signs/symptoms of physical distress.    Average METs 1.9      Resistance Training   Training Prescription Yes    Weight 2 lb    Reps 10-15      Interval Training   Interval Training No      Treadmill   MPH 1    Grade 0    Minutes 15    METs 1.77      NuStep   Level 4    Minutes 15    METs 2.4      T5 Nustep   Level 1   T6 nustep   Minutes 15    METs 1.7      Oxygen   Maintain Oxygen Saturation 88% or higher             Nutrition:  Target Goals: Understanding of nutrition guidelines, daily intake of sodium 1500mg , cholesterol 200mg , calories 30% from fat and 7% or less from saturated fats, daily to have 5 or more servings of fruits and vegetables.  Education: All About Nutrition: -Group instruction provided  by verbal, written material, interactive activities, discussions, models, and posters to present general guidelines for heart healthy nutrition including fat, fiber, MyPlate, the role of sodium in heart healthy nutrition, utilization of the nutrition label, and utilization of this knowledge for meal planning. Follow up email sent as well. Written material given at graduation.   Biometrics:  Pre Biometrics - 06/30/23 1622       Pre Biometrics   Height 5' 3.5" (1.613 m)    Weight 165 lb 12.8 oz (75.2 kg)    Waist  Circumference 39.5 inches    Hip Circumference 44.9 inches    Waist to Hip Ratio 0.88 %    BMI (Calculated) 28.91    Single Leg Stand 6.9 seconds              Nutrition Therapy Plan and Nutrition Goals:  Nutrition Therapy & Goals - 06/30/23 1605       Nutrition Therapy   Diet Carb controlled, cardiac, low Na    Protein (specify units) 75g    Fiber 25 grams    Whole Grain Foods 3 servings    Saturated Fats 15 max. grams    Fruits and Vegetables 5 servings/day    Sodium 2 grams      Personal Nutrition Goals   Nutrition Goal Eat 15-30gProtein and 30-60gCarbs at each meal.    Personal Goal #2 Practice mindful snacking and limit sugary treats to a few times per week    Personal Goal #3 Read labels and reduce sodium intake to below 2300mg . Ideally 1500mg  per day.    Comments Patient drinking ~60oz of water daily. She usually eats 3 times per day, likes fruits and veggies. Doesn't eat much animal meat, but does like fish and some chicken. She struggles with snacking and sugary foods. Her A1C is ~6.0%. She is aware of carb choices and portions. Reviewed mediterranean diet handout, educated on types of fats, sources, and how to read label. Provided sample meal plates handout and build out meals and snacks with foods she likes and will eat, focusing on adequate protein while controlling complex carb portions and including lots of colorful produce.      Intervention Plan    Intervention Prescribe, educate and counsel regarding individualized specific dietary modifications aiming towards targeted core components such as weight, hypertension, lipid management, diabetes, heart failure and other comorbidities.;Nutrition handout(s) given to patient.    Expected Outcomes Short Term Goal: Understand basic principles of dietary content, such as calories, fat, sodium, cholesterol and nutrients.;Short Term Goal: A plan has been developed with personal nutrition goals set during dietitian appointment.;Long Term Goal: Adherence to prescribed nutrition plan.             Nutrition Assessments:  MEDIFICTS Score Key: >=70 Need to make dietary changes  40-70 Heart Healthy Diet <= 40 Therapeutic Level Cholesterol Diet  Flowsheet Row Pulmonary Rehab from 07/07/2023 in [facility] Cardiac and Pulmonary Rehab  Picture Your Plate Total Score on Admission 72      Picture Your Plate Scores: <47 Unhealthy dietary pattern with much room for improvement. 41-50 Dietary pattern unlikely to meet recommendations for good health and room for improvement. 51-60 More healthful dietary pattern, with some room for improvement.  >60 Healthy dietary pattern, although there may be some specific behaviors that could be improved.   Nutrition Goals Re-Evaluation:  Nutrition Goals Re-Evaluation     Row Name 08/09/23 1128             Goals   Comment Spoke with her about healthy eating, including lots of colorful produce and quality protein. Controlling carb choices to pick complex carbs over refined, processed and concentrated sugary products.       Expected Outcome STG: meet protein goal of >70g. LTG: follow a heart healthy diet and lifestyle plan                Nutrition Goals Discharge (Final Nutrition Goals Re-Evaluation):  Nutrition Goals Re-Evaluation - 08/09/23 1128       Goals   Comment Spoke  with her about healthy eating, including lots of colorful produce and quality protein.  Controlling carb choices to pick complex carbs over refined, processed and concentrated sugary products.    Expected Outcome STG: meet protein goal of >70g. LTG: follow a heart healthy diet and lifestyle plan             Psychosocial: Target Goals: Acknowledge presence or absence of significant depression and/or stress, maximize coping skills, provide positive support system. Participant is able to verbalize types and ability to use techniques and skills needed for reducing stress and depression.   Education: Stress, Anxiety, and Depression - Group verbal and visual presentation to define topics covered.  Reviews how body is impacted by stress, anxiety, and depression.  Also discusses healthy ways to reduce stress and to treat/manage anxiety and depression.  Written material given at graduation.   Education: Sleep Hygiene -Provides group verbal and written instruction about how sleep can affect your health.  Define sleep hygiene, discuss sleep cycles and impact of sleep habits. Review good sleep hygiene tips.    Initial Review & Psychosocial Screening:  Initial Psych Review & Screening - 06/28/23 1028       Initial Review   Current issues with Current Sleep Concerns;Current Stress Concerns    Source of Stress Concerns Chronic Illness      Family Dynamics   Good Support System? Yes    Comments Franshesca lives by herself and can call her daughterif she needs support. Sometimes she has problems sleeping due to her shortness of breath. She states no depression and does not take anything for her mood.      Barriers   Psychosocial barriers to participate in program The patient should benefit from training in stress management and relaxation.      Screening Interventions   Interventions Encouraged to exercise;To provide support and resources with identified psychosocial needs;Provide feedback about the scores to participant    Expected Outcomes Short Term goal: Utilizing psychosocial  counselor, staff and physician to assist with identification of specific Stressors or current issues interfering with healing process. Setting desired goal for each stressor or current issue identified.;Long Term Goal: Stressors or current issues are controlled or eliminated.;Short Term goal: Identification and review with participant of any Quality of Life or Depression concerns found by scoring the questionnaire.;Long Term goal: The participant improves quality of Life and PHQ9 Scores as seen by post scores and/or verbalization of changes             Quality of Life Scores:  Scores of 19 and below usually indicate a poorer quality of life in these areas.  A difference of  2-3 points is a clinically meaningful difference.  A difference of 2-3 points in the total score of the Quality of Life Index has been associated with significant improvement in overall quality of life, self-image, physical symptoms, and general health in studies assessing change in quality of life.  PHQ-9: Review Flowsheet  More data exists      06/30/2023 01/14/2022 12/26/2020 12/21/2019 08/14/2019  Depression screen PHQ 2/9  Decreased Interest 1 0 0 0 0  Down, Depressed, Hopeless 0 0 0 0 0  PHQ - 2 Score 1 0 0 0 0  Altered sleeping 2 - - - -  Tired, decreased energy 2 - - - -  Change in appetite 0 - - - -  Feeling bad or failure about yourself  0 - - - -  Trouble concentrating 1 - - - -  Moving slowly or fidgety/restless 0 - - - -  Suicidal thoughts 0 - - - -  PHQ-9 Score 6 - - - -  Difficult doing work/chores Very difficult - - - -   Interpretation of Total Score  Total Score Depression Severity:  1-4 = Minimal depression, 5-9 = Mild depression, 10-14 = Moderate depression, 15-19 = Moderately severe depression, 20-27 = Severe depression   Psychosocial Evaluation and Intervention:  Psychosocial Evaluation - 06/28/23 1030       Psychosocial Evaluation & Interventions   Interventions Relaxation education;Stress  management education;Encouraged to exercise with the program and follow exercise prescription    Comments Jolynne lives by herself and can call her daughterif she needs support. Sometimes she has problems sleeping due to her shortness of breath. She states no depression and does not take anything for her mood.    Expected Outcomes Short: Attend [facility] stress management education to decrease stress. Long: Maintain exercise Post [facility] to keep stress at a minimum.    Continue Psychosocial Services  Follow up required by staff             Psychosocial Re-Evaluation:  Psychosocial Re-Evaluation     Row Name 08/09/23 1125             Psychosocial Re-Evaluation   Current issues with Current Sleep Concerns       Comments Wallace Cullens states she isnt not experiencing any anxiety depression or stress currently. Says she has a good support system of friends that have similar health issues. She says she has always struggled with sleep, some days she sleeps well others not well at all. She does take naps during the day. Days she exercises, she often takes a nap, but then struggles to sleep later in the night.       Expected Outcomes STG: continue to focus on getting good rest from naps and sleep. LTG: maintain a positive outlook on health and daily life       Interventions Encouraged to attend Pulmonary Rehabilitation for the exercise       Continue Psychosocial Services  Follow up required by staff                Psychosocial Discharge (Final Psychosocial Re-Evaluation):  Psychosocial Re-Evaluation - 08/09/23 1125       Psychosocial Re-Evaluation   Current issues with Current Sleep Concerns    Comments Wallace Cullens states she isnt not experiencing any anxiety depression or stress currently. Says she has a good support system of friends that have similar health issues. She says she has always struggled with sleep, some days she sleeps well others not well at all. She does take naps during the day.  Days she exercises, she often takes a nap, but then struggles to sleep later in the night.    Expected Outcomes STG: continue to focus on getting good rest from naps and sleep. LTG: maintain a positive outlook on health and daily life    Interventions Encouraged to attend Pulmonary Rehabilitation for the exercise    Continue Psychosocial Services  Follow up required by staff             Education: Education Goals: Education classes will be provided on a weekly basis, covering required topics. Participant will state understanding/return demonstration of topics presented.  Learning Barriers/Preferences:  Learning Barriers/Preferences - 06/28/23 1027       Learning Barriers/Preferences   Learning Barriers None    Learning Preferences None  General Pulmonary Education Topics:  Infection Prevention: - Provides verbal and written material to individual with discussion of infection control including proper hand washing and proper equipment cleaning during exercise session. Flowsheet Row Pulmonary Rehab from 06/30/2023 in [facility] Cardiac and Pulmonary Rehab  Date 06/30/23  Educator MB  Instruction Review Code 1- Verbalizes Understanding       Falls Prevention: - Provides verbal and written material to individual with discussion of falls prevention and safety. Flowsheet Row Pulmonary Rehab from 06/30/2023 in [facility] Cardiac and Pulmonary Rehab  Date 06/30/23  Educator MB  Instruction Review Code 1- Verbalizes Understanding       Chronic Lung Disease Review: - Group verbal instruction with posters, models, PowerPoint presentations and videos,  to review new updates, new respiratory medications, new advancements in procedures and treatments. Providing information on websites and "800" numbers for continued self-education. Includes information about supplement oxygen, available portable oxygen systems, continuous and intermittent flow rates, oxygen safety, concentrators, and  Medicare reimbursement for oxygen. Explanation of Pulmonary Drugs, including class, frequency, complications, importance of spacers, rinsing mouth after steroid MDI's, and proper cleaning methods for nebulizers. Review of basic lung anatomy and physiology related to function, structure, and complications of lung disease. Review of risk factors. Discussion about methods for diagnosing sleep apnea and types of masks and machines for OSA. Includes a review of the use of types of environmental controls: home humidity, furnaces, filters, dust mite/pet prevention, HEPA vacuums. Discussion about weather changes, air quality and the benefits of nasal washing. Instruction on Warning signs, infection symptoms, calling MD promptly, preventive modes, and value of vaccinations. Review of effective airway clearance, coughing and/or vibration techniques. Emphasizing that all should Create an Action Plan. Written material given at graduation.   AED/CPR: - Group verbal and written instruction with the use of models to demonstrate the basic use of the AED with the basic ABC's of resuscitation.    Anatomy and Cardiac Procedures: - Group verbal and visual presentation and models provide information about basic cardiac anatomy and function. Reviews the testing methods done to diagnose heart disease and the outcomes of the test results. Describes the treatment choices: Medical Management, Angioplasty, or Coronary Bypass Surgery for treating various heart conditions including Myocardial Infarction, Angina, Valve Disease, and Cardiac Arrhythmias.  Written material given at graduation.   Medication Safety: - Group verbal and visual instruction to review commonly prescribed medications for heart and lung disease. Reviews the medication, class of the drug, and side effects. Includes the steps to properly store meds and maintain the prescription regimen.  Written material given at graduation.   Other: -Provides group and  verbal instruction on various topics (see comments)   Knowledge Questionnaire Score:  Knowledge Questionnaire Score - 07/07/23 1349       Knowledge Questionnaire Score   Pre Score 14/18              Core Components/Risk Factors/Patient Goals at Admission:  Personal Goals and Risk Factors at Admission - 06/30/23 1622       Core Components/Risk Factors/Patient Goals on Admission    Weight Management Yes;Weight Loss    Intervention Weight Management: Develop a combined nutrition and exercise program designed to reach desired caloric intake, while maintaining appropriate intake of nutrient and fiber, sodium and fats, and appropriate energy expenditure required for the weight goal.;Weight Management: Provide education and appropriate resources to help participant work on and attain dietary goals.;Weight Management/Obesity: Establish reasonable short term and long term weight goals.;Obesity: Provide education and  appropriate resources to help participant work on and attain dietary goals.    Admit Weight 165 lb 12.8 oz (75.2 kg)    Goal Weight: Short Term 158 lb 12.8 oz (72 kg)    Goal Weight: Long Term 151 lb 12.8 oz (68.9 kg)    Expected Outcomes Short Term: Continue to assess and modify interventions until short term weight is achieved;Long Term: Adherence to nutrition and physical activity/exercise program aimed toward attainment of established weight goal;Weight Loss: Understanding of general recommendations for a balanced deficit meal plan, which promotes 1-2 lb weight loss per week and includes a negative energy balance of [id number] kcal/d;Understanding recommendations for meals to include 15-35% energy as protein, 25-35% energy from fat, 35-60% energy from carbohydrates, less than 200mg  of dietary cholesterol, 20-35 gm of total fiber daily;Understanding of distribution of calorie intake throughout the day with the consumption of 4-5 meals/snacks    Improve shortness of breath with ADL's  Yes    Intervention Provide education, individualized exercise plan and daily activity instruction to help decrease symptoms of SOB with activities of daily living.    Expected Outcomes Short Term: Improve cardiorespiratory fitness to achieve a reduction of symptoms when performing ADLs;Long Term: Be able to perform more ADLs without symptoms or delay the onset of symptoms    Hypertension Yes    Intervention Provide education on lifestyle modifcations including regular physical activity/exercise, weight management, moderate sodium restriction and increased consumption of fresh fruit, vegetables, and low fat dairy, alcohol moderation, and smoking cessation.;Monitor prescription use compliance.    Expected Outcomes Short Term: Continued assessment and intervention until BP is < 140/104mm HG in hypertensive participants. < 130/80mm HG in hypertensive participants with diabetes, heart failure or chronic kidney disease.;Long Term: Maintenance of blood pressure at goal levels.    Lipids Yes    Intervention Provide education and support for participant on nutrition & aerobic/resistive exercise along with prescribed medications to achieve LDL 70mg , HDL >40mg .    Expected Outcomes Short Term: Participant states understanding of desired cholesterol values and is compliant with medications prescribed. Participant is following exercise prescription and nutrition guidelines.;Long Term: Cholesterol controlled with medications as prescribed, with individualized exercise RX and with personalized nutrition plan. Value goals: LDL < 70mg , HDL > 40 mg.             Education:Diabetes - Individual verbal and written instruction to review signs/symptoms of diabetes, desired ranges of glucose level fasting, after meals and with exercise. Acknowledge that pre and post exercise glucose checks will be done for 3 sessions at entry of program.   Know Your Numbers and Heart Failure: - Group verbal and visual instruction to  discuss disease risk factors for cardiac and pulmonary disease and treatment options.  Reviews associated critical values for Overweight/Obesity, Hypertension, Cholesterol, and Diabetes.  Discusses basics of heart failure: signs/symptoms and treatments.  Introduces Heart Failure Zone chart for action plan for heart failure.  Written material given at graduation.   Core Components/Risk Factors/Patient Goals Review:   Goals and Risk Factor Review     Row Name 08/09/23 1130             Core Components/Risk Factors/Patient Goals Review   Personal Goals Review Weight Management/Obesity       Review Bree gained weight when being ill, she is now looking to lose some of that gained weight. She is more active than before but still unable to lose the weight. Encouraged her to increase workload as able and eat  low caloire nutrient dense foods often. The weight loss might take some time as her physcial exercise is limited due to shortness of breath.       Expected Outcomes STG: lose 5% of body weight. LTG: acheve and maintain healthy weight                Core Components/Risk Factors/Patient Goals at Discharge (Final Review):   Goals and Risk Factor Review - 08/09/23 1130       Core Components/Risk Factors/Patient Goals Review   Personal Goals Review Weight Management/Obesity    Review Bree gained weight when being ill, she is now looking to lose some of that gained weight. She is more active than before but still unable to lose the weight. Encouraged her to increase workload as able and eat low caloire nutrient dense foods often. The weight loss might take some time as her physcial exercise is limited due to shortness of breath.    Expected Outcomes STG: lose 5% of body weight. LTG: acheve and maintain healthy weight             ITP Comments:  ITP Comments     Row Name 06/28/23 1025 06/30/23 1614 07/07/23 1138 07/14/23 0812 08/11/23 0819   ITP Comments Virtual Visit completed. Patient  informed on EP and RD appointment and 6 Minute walk test. Patient also informed of patient health questionnaires on My Chart. Patient Verbalizes understanding. Visit diagnosis can be found in [facility] 06/09/2023. Completed and gym orientation. Initial ITP created and sent for review to Dr. Jinny Sanders, Medical Director. First full day of exercise!  Patient was oriented to gym and equipment including functions, settings, policies, and procedures.  Patient's individual exercise prescription and treatment plan were reviewed.  All starting workloads were established based on the results of the 6 minute walk test done at initial orientation visit.  The plan for exercise progression was also introduced and progression will be customized based on patient's performance and goals. 30 Day review completed. Medical Director ITP review done, changes made as directed, and signed approval by Medical Director. Newer to Program 30 Day review completed. Medical Director ITP review done, changes made as directed, and signed approval by Medical Director.            Comments: 30 Day review completed. Medical Director ITP review done, changes made as directed, and signed approval by Medical Director.

## 2023-08-18 ENCOUNTER — Encounter: Payer: MEDICARE | Admitting: *Deleted

## 2023-08-18 ENCOUNTER — Telehealth: Payer: Self-pay | Admitting: Cardiology

## 2023-08-18 DIAGNOSIS — R0602 Shortness of breath: Secondary | ICD-10-CM

## 2023-08-18 NOTE — Telephone Encounter (Signed)
 Patient wants a provider switch from Dr. Tomie China to Dr. Vincent Gros.  Please confirm.

## 2023-08-18 NOTE — Progress Notes (Signed)
 Daily Session Note  Patient Details  Name: Deanna [name] MRN: [id number] Date of Birth: 1946-04-20 Referring Provider:   Flowsheet Row Pulmonary Rehab from 06/30/2023 in [facility]  Referring Provider Janann Colonel, MD       Encounter Date: 08/18/2023  Check In:  Session Check In - 08/18/23 1504       Check-In   Supervising physician immediately available to respond to emergencies See telemetry face sheet for immediately available ER MD    Location [facility]-Cardiac & Pulmonary Rehab    Staff Present Cora Collum, RN, BSN, CCRP;Joseph Hood RCP,RRT,BSRT;Maxon Utica BS, Exercise Physiologist;Noah Tickle, BS, Exercise Physiologist    Virtual Visit No    Medication changes reported     No    Fall or balance concerns reported    No    Warm-up and Cool-down Performed on first and last piece of equipment    Resistance Training Performed Yes    VAD Patient? No    PAD/SET Patient? No      Pain Assessment   Currently in Pain? No/denies                Social History   Tobacco Use  Smoking Status Former   Current packs/day: 0.00   Average packs/day: 0.5 packs/day for 30.0 years (15.0 ttl pk-yrs)   Types: Cigarettes   Start date: 01/01/1968   Quit date: 12/31/1997   Years since quitting: 25.6  Smokeless Tobacco Never  Tobacco Comments   Quit in 1999    Goals Met:  Proper associated with RPD/PD & O2 Sat Independence with exercise equipment Exercise tolerated well No report of concerns or symptoms today  Goals Unmet:  Not Applicable  Comments: Pt able to follow exercise prescription today without complaint.  Will continue to monitor for progression.    Dr. Bethann Punches is Medical Director for [facility] Cardiac Rehabilitation.  Dr. Vida Rigger is Medical Director for [facility] Pulmonary Rehabilitation.

## 2023-08-19 DIAGNOSIS — I4891 Unspecified atrial fibrillation: Secondary | ICD-10-CM | POA: Insufficient documentation

## 2023-08-19 DIAGNOSIS — R0609 Other forms of dyspnea: Secondary | ICD-10-CM | POA: Insufficient documentation

## 2023-08-19 DIAGNOSIS — R0600 Dyspnea, unspecified: Secondary | ICD-10-CM | POA: Insufficient documentation

## 2023-08-19 DIAGNOSIS — Z8489 Family history of other specified conditions: Secondary | ICD-10-CM | POA: Insufficient documentation

## 2023-08-19 DIAGNOSIS — R011 Cardiac murmur, unspecified: Secondary | ICD-10-CM | POA: Insufficient documentation

## 2023-08-20 ENCOUNTER — Encounter: Payer: MEDICARE | Admitting: *Deleted

## 2023-08-20 ENCOUNTER — Ambulatory Visit: Payer: MEDICARE

## 2023-08-20 VITALS — BP 138/70 | HR 65 | Ht 63.0 in | Wt 168.8 lb

## 2023-08-20 DIAGNOSIS — I480 Paroxysmal atrial fibrillation: Secondary | ICD-10-CM | POA: Diagnosis present

## 2023-08-20 DIAGNOSIS — I517 Cardiomegaly: Secondary | ICD-10-CM | POA: Diagnosis present

## 2023-08-20 DIAGNOSIS — R0602 Shortness of breath: Secondary | ICD-10-CM

## 2023-08-20 DIAGNOSIS — I340 Nonrheumatic mitral (valve) insufficiency: Secondary | ICD-10-CM | POA: Diagnosis present

## 2023-08-20 NOTE — Assessment & Plan Note (Signed)
 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 Advised her to avoid dehydration.

## 2023-08-20 NOTE — Progress Notes (Signed)
 Daily Session Note  Patient Details  Name: Deanna [name] MRN: [id number] Date of Birth: [birth year] Referring Provider:   Flowsheet Row Pulmonary Rehab from 06/30/2023 in [facility]  Referring Provider Janann Colonel, MD       Encounter Date: 08/20/2023  Check In:  Session Check In - 08/20/23 1156       Check-In   Supervising physician immediately available to respond to emergencies See telemetry face sheet for immediately available ER MD    Location [facility]-Cardiac & Pulmonary Rehab    Staff Present Rory Percy, MS, Exercise Physiologist;Tariah Transue, RN, BSN, CCRP;Joseph Hood RCP,RRT,BSRT    Virtual Visit No    Medication changes reported     No    Fall or balance concerns reported    No    Warm-up and Cool-down Performed on first and last piece of equipment    Resistance Training Performed Yes    VAD Patient? No    PAD/SET Patient? No      Pain Assessment   Currently in Pain? No/denies                Social History   Tobacco Use  Smoking Status Former   Current packs/day: 0.00   Average packs/day: 0.5 packs/day for 30.0 years (15.0 ttl pk-yrs)   Types: Cigarettes   Start date: 01/01/1968   Quit date: 12/31/1997   Years since quitting: 25.6  Smokeless Tobacco Never  Tobacco Comments   Quit in 1999    Goals Met:  Proper associated with RPD/PD & O2 Sat Independence with exercise equipment Exercise tolerated well No report of concerns or symptoms today  Goals Unmet:  Not Applicable  Comments: Pt able to follow exercise prescription today without complaint.  Will continue to monitor for progression.    Dr. Bethann Punches is Medical Director for [facility] Cardiac Rehabilitation.  Dr. Vida Rigger is Medical Director for [facility] Pulmonary Rehabilitation.

## 2023-08-20 NOTE — Assessment & Plan Note (Signed)
 Chronic history of dyspnea. Multifactorial in the setting of COPD, deconditioning. She is at risk for heart failure however her recent BNP in January 2025 was 84.7 and she did not have significant elevation of high-sensitivity troponins which were relatively flat in comparison to her prior numbers.  Continue optimization of her pulmonary management as per primary team and pulmonologist. Advised her to keep her sodium intake down. Advised her to watch out for signs and symptoms of heart failure as discussed above under left ventricular hypertrophy.

## 2023-08-20 NOTE — Assessment & Plan Note (Addendum)
 Paroxysmal atrial fibrillation diagnosed September 2024. In sinus rhythm today. Suspect some of couple episodes of symptomatic shortness of breath and chest discomfort she experienced this year so far unlikely related to breakthrough episodes of A-fib.  Currently on anticoagulation with Eliquis 5 mg twice daily. Tolerating well. Given possibility that she has hypertrophic cardiomyopathy she needs to be on anticoagulation irrespective of her CHA2DS2-VASc risk.  If she does have recurrent episodes of A-fib, will consider rhythm control peripheral electrophysiologist for ablation given her limited options for antiarrhythmic therapy.

## 2023-08-20 NOTE — Patient Instructions (Signed)
Medication Instructions:  Your physician recommends that you continue on your current medications as directed. Please refer to the Current Medication list given to you today.  *If you need a refill on your cardiac medications before your next appointment, please call your pharmacy*   Lab Work: None ordered If you have labs (blood work) drawn today and your tests are completely normal, you will receive your results only by: MyChart Message (if you have MyChart) OR A paper copy in the mail If you have any lab test that is abnormal or we need to change your treatment, we will call you to review the results.   Testing/Procedures: Your physician has requested that you have an echocardiogram. Echocardiography is a painless test that uses sound waves to create images of your heart. It provides your doctor with information about the size and shape of your heart and how well your heart's chambers and valves are working. This procedure takes approximately one hour. There are no restrictions for this procedure. Please do NOT wear cologne, perfume, aftershave, or lotions (deodorant is allowed). Please arrive 15 minutes prior to your appointment time.     Follow-Up: At [facility], you and your health needs are our priority.  As part of our continuing mission to provide you with exceptional heart care, we have created designated Provider Care Teams.  These Care Teams include your primary Cardiologist (physician) and Advanced Practice Providers (APPs -  Physician Assistants and Nurse Practitioners) who all work together to provide you with the care you need, when you need it.  We recommend signing up for the patient portal called "MyChart".  Sign up information is provided on this After Visit Summary.  MyChart is used to connect with patients for Virtual Visits (Telemedicine).  Patients are able to view lab/test results, encounter notes, upcoming appointments, etc.  Non-urgent messages can be sent to  your provider as well.   To learn more about what you can do with MyChart, go to [email or web address].    Your next appointment:   6 month(s)  The format for your next appointment:   In Person  Provider:   Vern Claude Madireddy, MD   Other Instructions Echocardiogram An echocardiogram is a test that uses sound waves (ultrasound) to produce images of the heart. Images from an echocardiogram can provide important information about: Heart size and shape. The size and thickness and movement of your heart's walls. Heart muscle function and strength. Heart valve function or if you have stenosis. Stenosis is when the heart valves are too narrow. If blood is flowing backward through the heart valves (regurgitation). A tumor or infectious growth around the heart valves. Areas of heart muscle that are not working well because of poor blood flow or injury from a heart attack. Aneurysm detection. An aneurysm is a weak or damaged part of an artery wall. The wall bulges out from the normal force of blood pumping through the body. Tell a health care provider about: Any allergies you have. All medicines you are taking, including vitamins, herbs, eye drops, creams, and over-the-counter medicines. Any blood disorders you have. Any surgeries you have had. Any medical conditions you have. Whether you are pregnant or may be pregnant. What are the risks? Generally, this is a safe test. However, problems may occur, including an allergic reaction to dye (contrast) that may be used during the test. What happens before the test? No specific preparation is needed. You may eat and drink normally. What happens during the test? You will  take off your clothes from the waist up and put on a hospital gown. Electrodes or electrocardiogram (ECG)patches may be placed on your chest. The electrodes or patches are then connected to a device that monitors your heart rate and rhythm. You will lie down on a table for  an ultrasound exam. A gel will be applied to your chest to help sound waves pass through your skin. A handheld device, called a transducer, will be pressed against your chest and moved over your heart. The transducer produces sound waves that travel to your heart and bounce back (or "echo" back) to the transducer. These sound waves will be captured in real-time and changed into images of your heart that can be viewed on a video monitor. The images will be recorded on a computer and reviewed by your health care provider. You may be asked to change positions or hold your breath for a short time. This makes it easier to get different views or better views of your heart. In some cases, you may receive contrast through an IV in one of your veins. This can improve the quality of the pictures from your heart. The procedure may vary among health care providers and hospitals.   What can I expect after the test? You may return to your normal, everyday life, including diet, activities, and medicines, unless your health care provider tells you not to do that. Follow these instructions at home: It is up to you to get the results of your test. Ask your health care provider, or the department that is doing the test, when your results will be ready. Keep all follow-up visits. This is important. Summary An echocardiogram is a test that uses sound waves (ultrasound) to produce images of the heart. Images from an echocardiogram can provide important information about the size and shape of your heart, heart muscle function, heart valve function, and other possible heart problems. You do not need to do anything to prepare before this test. You may eat and drink normally. After the echocardiogram is completed, you may return to your normal, everyday life, unless your health care provider tells you not to do that. This information is not intended to replace advice given to you by your health care provider. Make sure you  discuss any questions you have with your health care provider. Document Revised: 01/09/2020 Document Reviewed: 01/09/2020 Elsevier Patient Education  2021 [employer]   Important Information About Sugar

## 2023-08-20 NOTE — Progress Notes (Addendum)
 Cardiology Consultation:    Date:  08/20/2023   ID:  Mahagony Grieb Tramell, DOB 11-23-1945, MRN [id number]  PCP:  Carolyn Cisco, NP  Cardiologist:  Daymon Evans Xara Paulding, MD   Referring MD: Carolyn Cisco, NP   No chief complaint on file.    ASSESSMENT AND PLAN:   Ms. 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  Problem List Items Addressed This Visit     Non-rheumatic mitral regurgitation   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 Advised her to avoid dehydration.       Relevant Orders   ECHOCARDIOGRAM COMPLETE   Left ventricular hypertrophy   Imaging highly suspicious for hypertrophic cardiomyopathy. Other differential given her history includes hypertensive heart disease versus infiltrative heart disease [amyloid in the setting of marginal zone lymphoma history]  Her last echocardiogram to review is from September 2024, images reviewed today.  She does not have any clinical signs or symptoms of heart failure at this time. Discussed further evaluation cardiac anatomy with cardiac MRI.  She is agreeable.  If features are suggestive of hypertrophic cardiomyopathy or infiltrative heart disease, will refer to hypertrophic cardiac myopathy specialty clinic at  [facility] with Dr. Annabelle Barrack or  any other place of her choosing.  Advised her to avoid dehydration and keep yourself well-hydrated. Also cautioned her to watch out for any signs of fluid buildup, weight monitoring, lower extremity edema that would suggest heart failure.  Plan: - Cardiac MRI. Further follow-up in the clinic based on test results.       A-fib [facility])   Paroxysmal atrial fibrillation diagnosed September 2024. In sinus rhythm today. Suspect some of couple episodes of symptomatic shortness of breath and chest discomfort she experienced this year so far unlikely related to breakthrough episodes of A-fib.  Currently on anticoagulation with Eliquis  5 mg twice daily. Tolerating well. Given possibility that she has hypertrophic cardiomyopathy she needs to be on anticoagulation irrespective of her CHA2DS2-VASc risk.  If she does have recurrent episodes of A-fib, will consider rhythm control peripheral electrophysiologist for ablation given her limited options for antiarrhythmic therapy.      Dyspnea - Primary   Chronic history of dyspnea. Multifactorial in the setting of COPD, deconditioning. She is at risk for heart failure however her recent BNP in January 2025 was 84.7 and she did not have significant elevation of high-sensitivity troponins which were relatively flat in comparison to her prior numbers.  Continue optimization of her pulmonary management as per primary team and pulmonologist. Advised her to keep her sodium intake down. Advised her to watch out for signs and symptoms of heart failure as discussed above under left ventricular hypertrophy.       Relevant Orders   EKG 12-Lead (Completed)   ECHOCARDIOGRAM COMPLETE   Echocardiogram that was ordered before reviewing her recent echocardiogram results from September 2024 will be canceled and we will schedule her for cardiac MRI. Follow-up in the office based on test results.  Addendum  10-10-2023: -  Cardiac MRI shows LVEF mildly reduced 46%, noncoronary LGE pattern, suspicious for cardiac amyloidosis.  Cannot entirely exclude hypertrophic cardiomyopathy. - recommend establishing care with hypertrophic cardiomyopathy clinic, with Dr. Annabelle Barrack, also recommend her to follow-up with her oncologist regarding any further evaluation needed for possible amyloidosis.  Will need optimization of guideline directed medical therapy.  In the setting stop diltiazem , titrate up metoprolol  to tartrate dose to 50 mg twice daily Schedule for follow-up visit with me in the office next available.  Addendum 11-01-2023: Received a message from oncologist Dr. Bernetta Brilliant today over the phone. He mentions light chain screen was negative for amyloid.  Fat pad biopsy was unremarkable She is currently pending visit with Dr. Annabelle Barrack September 22.  Will see if this visit can be expedited and if she can be set up for TcPYP scan study to assess for wild type ATTR amyloidosis prior to the scheduled visit with Dr. Annabelle Barrack to avoid delays.   History of Present Illness:    Kinlee Garrison Burgo is a 78 y.o. female who is being seen today for the evaluation of possible hypertrophic cardiomyopathy at the request of Carolyn Cisco, NP.  Until recently was following up with cardiologist at [facility] with Dr.Vasu. Recently was seen at [facility] September 2024 as inpatient by Dr. Sharyn Deforest from cardiology. Pulmonology follows up with Dr.Assaker at [facility] pulmonary care in [facility] Oncology follows up with Dr. 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  Pleasant woman.  Lives by herself in [facility]  .  Here for the visit by herself. Now presents here to establish care for outpatient follow-up with cardiology.  Tells me that she was supposed to follow-up with Dr.Kadakia but unclear for what reason she was unable to follow-up.  In September 2024 was at [facility] in the setting of A-fib RVR and mild troponin elevation.  NSTEMI was attributed to type II causes, further ischemic workup was done with stress test nuclear imaging as part of preop assessment prior to her hip surgery.  It showed no ischemia.  She proceeded with hip surgery subsequently without significant cardiac complications.  Her echocardiogram findings were significant for asymmetric hypertrophy of the left ventricle without any obvious outflow gradient reported.  Images reviewed myself appear to show dynamic outflow gradient in the mid cavity to LV however technically limited study for assessment.  Was recently in the emergency room June 24, 2023 again for symptoms of dyspnea and palpitations and was monitored in the ER for few hours before being discharged after workup that showed no major abnormalities.  Tells me that her baseline functional capacity is limited due to shortness of breath due to COPD.  Overall she feels her COPD related symptoms are well-controlled on current inhaler therapy.  She continues to follow-up with pulmonologist.  She also reports symptoms of chest pressure-like sensation associated with the fast heartbeat like symptoms.  Current couple times in the last 3 months.  Earlier in January she went to the ER for the symptoms.  More recently she had a shorter  episode about 3 to 4 weeks ago that lasted for about  15 minutes.  No more episodes since. Denies any syncopal or near syncopal episodes. Denies any pedal edema. Continues to take her blood thinners consistently for A-fib. Denies any blood in urine or stools  Quit smoking-year-old 1999 Does not drink alcohol . Tried marijuana in the past, but never used routinely.  No other illicit drug use.  EKG in the clinic today shows sinus rhythm heart rate 64/min, PR interval prolonged to 44 ms, left bundle branch block morphology with QRS duration 124 ms. Prior EKG for comparison from July 23 shows sinus rhythm heart rate 79/min with left bundle branch block QRS duration 144 ms.  Echocardiogram from September 2024 at [facility] noted LVEF 60 to 65%, severe asymmetric Left ventricular hypertrophy of the septal segments with grade 1 diastolic dysfunction, LVEF 60 to 65%, mild to moderate mitral regurgitation,  Stress test with nuclear imaging 02/10/2023 reported no ischemia or infarct and findings.  LVEF 56% on gated images.  Serum protein electrophoresis results from 02-10-2023 report no evidence of monoclonal protein.  Recent blood work from 06-24-2023 BNP 84.7 High-sensitivity troponin I was 25, 21 CBC with hemoglobin 13.5, hematocrit 40.5, WBC 8.6.   Blood work from 06-30-2023 sodium 139, potassium 3.6, BUN 13, creatinine 0.76, EGFR 81. Normal transaminases and alkaline phosphatase.   Past Medical History:  Diagnosis Date   A-fib (HCC)    Abnormal glucose 03/06/2020   Acute coronary syndrome (HCC) 02/09/2023   Age-related osteoporosis without current pathological fracture 11/22/2015   Overview:  Femoral neck  Formatting of this note might be different from the original. Femoral neck   Allergic rhinitis due to pollen 08/19/2015   Allergy    Aortic atherosclerosis (HCC) 04/04/2021   Arthritis    Carpal tunnel syndrome, bilateral 02/28/2002   Overview:  H/O CTS   Celiac disease    Cervical disc disorder 05/10/2009   Chronic cough  02/15/2006   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 11/26/2015   Formatting of this note might be different from the original. Added automatically from request for surgery [id number]   Chronic pain syndrome 04/01/2015   Colon polyps    Dyspnea    Essential hypertension 07/20/2000   Overview:  HBP   Family history of adverse reaction to anesthesia    mother delerium   Female cystocele 02/27/2009   GAD (generalized anxiety disorder) 08/19/2015   GERD (gastroesophageal reflux disease)    Heart murmur    History of Bell's palsy 03/21/2015   Hx of adenomatous colonic polyps 12/20/2008   Overview:  Colonoscopy 07/2003 - [facility] - polyps removed - recommended repeat in 3 years Colonoscopy 09/2011 - Dr. Aleta Anda - hyperplastic polyp(s) were removed - repeat in 5 years   Hypertension    Incomplete uterovaginal prolapse 03/04/2004   Insomnia    Left bundle-branch block 05/20/2006   Lumbar radiculopathy 01/09/2016   Formatting of this note might be different from the original. Added automatically from request for surgery [id number]   LVH (left ventricular 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 05/23/2015   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   Mitral regurgitation 04/04/2021   Mixed dyslipidemia 04/04/2021   Non-rheumatic mitral regurgitation 03/21/2015   Osteopenia determined by x-ray 11/22/2015   Formatting of this note might be different from the original. Femoral neck   Other intervertebral disc degeneration, lumbar region 01/29/2015   Pre-operative cardiovascular examination 09/03/2020   Primary osteoarthritis, right hand 02/28/2002  Overview:  OA HANDS, ESP THUMBS   Radial tunnel syndrome, right 11/04/2016   Radiculopathy, lumbar region 11/22/2014   Overview:  Added automatically from request for surgery [id number]   Rhinitis, allergic 02/24/2002   Overview:  ALLERGY SYMPTOMS - states was seen by allergist in past and informed allergic to dust   S/P left total hip arthroplasty  09/17/2020   S/P total right hip arthroplasty 02/18/2023   Sacroiliitis (HCC) 08/02/2019   Screening for colon cancer 04/17/2005   Formatting of this note might be different from the original. 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 TRIGGER THUMBS - started spring 2003. Injected 02/28/02   Unspecified asthma, uncomplicated 12/25/2004    Past Surgical History:  Procedure Laterality Date   ABDOMINAL HYSTERECTOMY  2010   APPENDECTOMY  1969   CHOLECYSTECTOMY  1969   SPINAL FUSION  01/2016   TONSILLECTOMY AND ADENOIDECTOMY  1956   TOTAL HIP ARTHROPLASTY Left 09/17/2020   Procedure: TOTAL HIP ARTHROPLASTY ANTERIOR APPROACH;  Surgeon: Claiborne Crew, MD;  Location: [facility];  Service: Orthopedics;  Laterality: Left;  70 mins   TOTAL HIP ARTHROPLASTY Right 02/18/2023   Procedure: TOTAL HIP ARTHROPLASTY ANTERIOR APPROACH;  Surgeon: Claiborne Crew, MD;  Location: [facility] ORS;  Service: Orthopedics;  Laterality: Right;    Current Medications: Current Meds  Medication Sig   albuterol  (VENTOLIN  HFA) 108 (90 Base) MCG/ACT inhaler Inhale 2 puffs into the lungs every 6 (six) hours as needed for wheezing or shortness of breath.   apixaban  (ELIQUIS ) 5 MG TABS tablet Take 1 tablet (5 mg total) by mouth 2 (two) times daily. Hold medication from 9/15 to 9/21 for right hip surgery   Ascorbic Acid  (VITAMIN C ) 1000 MG tablet Take 4,000 mg by mouth daily.   azithromycin  (ZITHROMAX ) 250 MG tablet Take 1 tablet (250 mg total) by mouth daily. Take first 2 tablets together, then 1 every day until finished.   budesonide -formoterol  (SYMBICORT ) 160-4.5 MCG/ACT inhaler Inhale 2  puffs into the lungs in the morning and at bedtime.   Calcium -Magnesium  (CAL-MAG PO) Take 2,000 mg by mouth at bedtime. 1000 mg   Carboxymethylcellul-Glycerin  (CLEAR EYES FOR DRY EYES OP) Place 1 drop into both eyes daily.   [employer] MAGNESIUM  OXIDE 250 MG TABS TAKE 1 TABLET (250 MG TOTAL) BY MOUTH EVERY EVENING. TAKE WITH EVENING MEAL   diltiazem  (CARDIZEM  SR) 120 MG 12 hr capsule Take 1 capsule (120 mg total) by mouth every 12 (twelve) hours.   furosemide  (LASIX ) 20 MG tablet Take 20 mg by mouth as needed.   hydrochlorothiazide  (HYDRODIURIL ) 25 MG tablet Take 1 tablet (25 mg total) by mouth 2 (two) times daily.   HYDROcodone -acetaminophen  (NORCO/VICODIN) 5-325 MG tablet Take 1-2 tablets by mouth every 4 (four) hours as needed for severe pain.   loratadine  (CLARITIN ) 10 MG tablet Take 10 mg by mouth daily as needed for allergies.   losartan  (COZAAR ) 50 MG tablet TAKE 1 TABLET BY MOUTH EVERY DAY   methocarbamol  (ROBAXIN ) 500 MG tablet Take 1 tablet (500 mg total) by mouth every 6 (six) hours as needed for muscle spasms (muscle pain).   metoprolol  tartrate (LOPRESSOR ) 25 MG  tablet Take 1 tablet (25 mg total) by mouth 2 (two) times daily.   milk thistle 175 MG tablet Take 175 mg by mouth daily.   nitroGLYCERIN  (NITROSTAT ) 0.4 MG SL tablet Place 1 tablet (0.4 mg total) under the tongue every 5 (five) minutes x 3 doses as needed for chest pain.   polyethylene glycol (MIRALAX  / GLYCOLAX ) 17 g packet Take 17 g by mouth 2 (two) times daily.   Probiotic Product (PROBIOTIC DAILY PO) Take 1 tablet by mouth daily.   rosuvastatin  (CRESTOR ) 20 MG tablet Take 1 tablet (20 mg total) by mouth daily.   umeclidinium bromide  (INCRUSE ELLIPTA ) 62.5 MCG/ACT AEPB Inhale 1 puff into the lungs daily.   Vitamin A  2400 MCG (8000 UT) CAPS Take 16,000 Units by mouth daily. 8000 units   Vitamin D, Ergocalciferol, (DRISDOL) 1.25 MG (50000 UNIT) CAPS capsule Take 50,000 Units by mouth once a week.     Allergies:   Codeine, Gluten  meal, Dust mite extract, Gabapentin , Sulfa antibiotics, and Terbinafine hcl   Social History   Socioeconomic History   Marital status: Divorced    Spouse name: Not on file   Number of children: 2   Years of education: 2 years college   Highest education level: Not on file  Occupational History   Occupation: Retired  Tobacco Use   Smoking status: Former    Current packs/day: 0.00    Average packs/day: 0.5 packs/day for 30.0 years (15.0 ttl pk-yrs)    Types: Cigarettes    Start date: 01/01/1968    Quit date: 12/31/1997    Years since quitting: 25.6   Smokeless tobacco: Never   Tobacco comments:    Quit in 1999  Vaping Use   Vaping status: Never Used  Substance and Sexual Activity   Alcohol  use: Not Currently   Drug use: No   Sexual activity: Not Currently    Birth control/protection: Post-menopausal  Other Topics Concern   Not on file  Social History Narrative   Lives alone.   Right-handed.   One cup caffeine per day.   Social Drivers of Health   Financial Resource Strain: High Risk (01/14/2022)   Overall Financial Resource Strain (CARDIA)    Difficulty of Paying Living Expenses: Hard  Food Insecurity: Low Risk  (06/29/2023)   Received from [facility]   Hunger Vital Sign    Worried About Running Out of Food in the Last Year: Never true    Ran Out of Food in the Last Year: Patient declined to answer  Transportation Needs: Not on file (06/29/2023)  Physical Activity: Inactive (01/14/2022)   Exercise Vital Sign    Days of Exercise per Week: 0 days    Minutes of Exercise per Session: 0 min  Stress: Stress Concern Present (01/14/2022)   [employer] of Occupational Health - Occupational Stress Questionnaire    Feeling of Stress : To some extent  Social Connections: Unknown (10/13/2021)   Received from [facility]   Social Network    Social Network: Not on file     Family History: The patient's family history includes Arthritis in her mother;  Diabetes in her mother and sister; Early death in her father and sister; Leukemia in her father and sister; Pancreatic cancer (age of onset: 61) in her mother. ROS:   Please see the history of present illness.    All 14 point review of systems negative except as described per history of present illness.  EKGs/Labs/Other Studies Reviewed:  The following studies were reviewed today:   EKG:  EKG Interpretation Date/Time:  Friday August 20 2023 15:46:39 EDT Ventricular Rate:  65 PR Interval:  244 QRS Duration:  124 QT Interval:  452 QTC Calculation: 470 R Axis:   31  Text Interpretation: Sinus rhythm with 1st degree A-V block Left bundle branch block Abnormal ECG When compared with ECG of 24-Jun-2023 13:36, QRS axis Shifted right T wave inversion now evident in Inferior leads Confirmed by Bertha Broad reddy [phone]) on 08/20/2023 4:21:38 PM    Recent Labs: 12/12/2022: ALT 25 02/09/2023: Magnesium  2.1; TSH 3.431 06/24/2023: B Natriuretic Peptide 84.7; BUN 18; Creatinine, Ser 0.77; Hemoglobin 13.5; Platelets 293; Potassium 3.4; Sodium 135  Recent Lipid Panel    Component Value Date/Time   CHOL 175 02/10/2023 0653   CHOL 153 11/04/2021 1031   TRIG 186 (H) 02/10/2023 0653   HDL 42 02/10/2023 0653   HDL 54 11/04/2021 1031   CHOLHDL 4.2 02/10/2023 0653   VLDL 37 02/10/2023 0653   LDLCALC 96 02/10/2023 0653   LDLCALC 89 11/04/2021 1031    Physical Exam:    VS:  BP 138/70   Pulse 65   Ht 5\' 3"  (1.6 m)   Wt 168 lb 12.8 oz (76.6 kg)   SpO2 96%   BMI 29.90 kg/m     Wt Readings from Last 3 Encounters:  08/20/23 168 lb 12.8 oz (76.6 kg)  06/30/23 165 lb 12.8 oz (75.2 kg)  06/09/23 166 lb (75.3 kg)     GENERAL:  Well nourished, well developed in no acute distress NECK: No JVD; No carotid bruits CARDIAC: RRR, S1 and S2 present, no murmurs, no rubs, no gallops CHEST:  Clear to auscultation without rales, wheezing or rhonchi  Extremities: No pitting pedal edema. Pulses  bilaterally symmetric with radial 2+ and dorsalis pedis 2+ NEUROLOGIC:  Alert and oriented x 3  Medication Adjustments/Labs and Tests Ordered: Current medicines are reviewed at length with the patient today.  Concerns regarding medicines are outlined above.  Orders Placed This Encounter  Procedures   EKG 12-Lead   ECHOCARDIOGRAM COMPLETE   No orders of the defined types were placed in this encounter.   Signed, Lura Sallies, MD, MPH, [facility]. 08/20/2023 6:19 PM    [facility]

## 2023-08-20 NOTE — Assessment & Plan Note (Signed)
 Imaging highly suspicious for hypertrophic cardiomyopathy. Other differential given her history includes hypertensive heart disease versus infiltrative heart disease [amyloid in the setting of marginal zone lymphoma history]  Her last echocardiogram to review is from September 2024, images reviewed today.  She does not have any clinical signs or symptoms of heart failure at this time. Discussed further evaluation cardiac anatomy with cardiac MRI.  She is agreeable.  If features are suggestive of hypertrophic cardiomyopathy or infiltrative heart disease, will refer to hypertrophic cardiac myopathy specialty clinic at [facility] with Dr. Raynelle Jan or  any other place of her choosing.  Advised her to avoid dehydration and keep yourself well-hydrated. Also cautioned her to watch out for any signs of fluid buildup, weight monitoring, lower extremity edema that would suggest heart failure.  Plan: - Cardiac MRI. Further follow-up in the clinic based on test results.

## 2023-08-23 ENCOUNTER — Encounter: Payer: MEDICARE | Admitting: *Deleted

## 2023-08-23 ENCOUNTER — Encounter (HOSPITAL_COMMUNITY): Payer: Self-pay

## 2023-08-23 ENCOUNTER — Telehealth: Payer: Self-pay

## 2023-08-23 DIAGNOSIS — I421 Obstructive hypertrophic cardiomyopathy: Secondary | ICD-10-CM

## 2023-08-23 DIAGNOSIS — R0602 Shortness of breath: Secondary | ICD-10-CM

## 2023-08-23 NOTE — Telephone Encounter (Signed)
-----   Message from Berne R Madireddy sent at 08/20/2023  6:22 PM EDT ----- Jacklynn Ganong,  Please cancel the echocardiogram that we ordered at the office visit and place an order for a cardiac MRI. Diagnosis: Hypertrophic cardiomyopathy. Follow-up in the office based on test results from cardiac MRI.  I called and discussed with her the change in plan over the phone today after the office visit once I was able to review the echocardiogram images from September 2024.  thank you

## 2023-08-23 NOTE — Progress Notes (Signed)
 Daily Session Note  Patient Details  Name: Kerston Landeck Duffee MRN: [id number] Date of Birth: 02/15/1946 Referring Provider:   Flowsheet Row Pulmonary Rehab from 06/30/2023 in [facility] Cardiac [facility] Pulmonary Rehab  Referring Provider Janann Colonel, MD       Encounter Date: 08/23/2023  Check In:  Session Check In - 08/23/23 1155       Check-In   Supervising physician immediately available to respond to emergencies See telemetry face sheet for immediately available ER MD    Location [facility]-Cardiac & Pulmonary Rehab    Staff Present Tommye Standard BS, ACSM CEP, Exercise Physiologist;Maxon Conetta BS, Exercise Physiologist;Margaret Best, MS, Exercise Physiologist;Susanne Bice, RN, BSN, CCRP;Meredith Jewel Baize RN,BSN    Virtual Visit No    Medication changes reported     No    Fall or balance concerns reported    No    Warm-up and Cool-down Performed on first and last piece of equipment    Resistance Training Performed Yes    VAD Patient? No    PAD/SET Patient? No      Pain Assessment   Currently in Pain? No/denies    Multiple Pain Sites No                Social History   Tobacco Use  Smoking Status Former   Current packs/day: 0.00   Average packs/day: 0.5 packs/day for 30.0 years (15.0 ttl pk-yrs)   Types: Cigarettes   Start date: 01/01/1968   Quit date: 12/31/1997   Years since quitting: 25.6  Smokeless Tobacco Never  Tobacco Comments   Quit in 1999    Goals Met:  Proper associated with RPD/PD & O2 Sat Independence with exercise equipment Exercise tolerated well Personal goals reviewed No report of concerns or symptoms today  Goals Unmet:  Not Applicable  Comments: Pt able to follow exercise prescription today without complaint.  Will continue to monitor for progression.   Reviewed home exercise with pt today from 11:20 am to 11:32 am.  Pt plans to finish PT and then start going to her community fitness center for exercise.  Reviewed THR, pulse, RPE, sign and  symptoms, pulse oximetery and when to call 911 or MD.  Also discussed weather considerations and indoor options.  Pt voiced understanding.    Dr. Bethann Punches is Medical Director for [facility] Cardiac Rehabilitation.  Dr. Vida Rigger is Medical Director for [facility] Pulmonary Rehabilitation.

## 2023-08-23 NOTE — Telephone Encounter (Signed)
 Order changed and message sent in MyChart with instructions.

## 2023-08-27 ENCOUNTER — Encounter: Payer: MEDICARE | Admitting: *Deleted

## 2023-08-27 DIAGNOSIS — R0602 Shortness of breath: Secondary | ICD-10-CM | POA: Diagnosis not present

## 2023-08-27 NOTE — Progress Notes (Signed)
 Daily Session Note  Patient Details  Name: Deanna [name] MRN: [id number] Date of Birth: 27-Aug-1945 Referring Provider:   Flowsheet Row Pulmonary Rehab from 06/30/2023 in [facility] Cardiac and Pulmonary Rehab  Referring Provider Janann Colonel, MD       Encounter Date: 08/27/2023  Check In:  Session Check In - 08/27/23 1143       Check-In   Supervising physician immediately available to respond to emergencies See telemetry face sheet for immediately available ER MD    Location [facility]-Cardiac & Pulmonary Rehab    Staff Present Cora Collum, RN, BSN, CCRP;Joseph Hood RCP,RRT,BSRT;Noah Tickle, [place], Exercise Physiologist    Virtual Visit No    Medication changes reported     No    Fall or balance concerns reported    No    Warm-up and Cool-down Performed on first and last piece of equipment    Resistance Training Performed Yes    VAD Patient? No    PAD/SET Patient? No      Pain Assessment   Currently in Pain? No/denies                Social History   Tobacco Use  Smoking Status Former   Current packs/day: 0.00   Average packs/day: 0.5 packs/day for 30.0 years (15.0 ttl pk-yrs)   Types: Cigarettes   Start date: 01/01/1968   Quit date: 12/31/1997   Years since quitting: 25.6  Smokeless Tobacco Never  Tobacco Comments   Quit in 1999    Goals Met:  Proper associated with RPD/PD & O2 Sat Independence with exercise equipment Exercise tolerated well No report of concerns or symptoms today  Goals Unmet:  Not Applicable  Comments: Pt able to follow exercise prescription today without complaint.  Will continue to monitor for progression.    Dr. Bethann Punches is Medical Director for [facility] Cardiac Rehabilitation.  Dr. Vida Rigger is Medical Director for [facility] Pulmonary Rehabilitation.

## 2023-08-30 ENCOUNTER — Encounter: Payer: MEDICARE | Admitting: *Deleted

## 2023-08-30 DIAGNOSIS — R0602 Shortness of breath: Secondary | ICD-10-CM | POA: Diagnosis not present

## 2023-08-30 NOTE — Progress Notes (Signed)
 Daily Session Note  Patient Details  Name: Deanna [name] MRN: [id number] Date of Birth: [birth year] Referring Provider:   Flowsheet Row Pulmonary Rehab from 06/30/2023 in [facility] Cardiac and Pulmonary Rehab  Referring Provider Janann Colonel, MD       Encounter Date: 08/30/2023  Check In:  Session Check In - 08/30/23 1142       Check-In   Supervising physician immediately available to respond to emergencies See telemetry face sheet for immediately available ER MD    Location [facility]-Cardiac & Pulmonary Rehab    Staff Present Rory Percy, MS, Exercise Physiologist;Eretria Manternach, RN, BSN, CCRP;Maxon Conetta BS, Exercise Physiologist;Kelly [employer], ACSM CEP, Exercise Physiologist    Virtual Visit No    Medication changes reported     No    Fall or balance concerns reported    No    Warm-up and Cool-down Performed on first and last piece of equipment    Resistance Training Performed Yes    VAD Patient? No    PAD/SET Patient? No      Pain Assessment   Currently in Pain? No/denies                Social History   Tobacco Use  Smoking Status Former   Current packs/day: 0.00   Average packs/day: 0.5 packs/day for 30.0 years (15.0 ttl pk-yrs)   Types: Cigarettes   Start date: 01/01/1968   Quit date: 12/31/1997   Years since quitting: 25.6  Smokeless Tobacco Never  Tobacco Comments   Quit in 1999    Goals Met:  Proper associated with RPD/PD & O2 Sat Independence with exercise equipment Exercise tolerated well No report of concerns or symptoms today  Goals Unmet:  Not Applicable  Comments: Pt able to follow exercise prescription today without complaint.  Will continue to monitor for progression.    Dr. Bethann Punches is Medical Director for [facility] Cardiac Rehabilitation.  Dr. Vida Rigger is Medical Director for [facility] Pulmonary Rehabilitation.

## 2023-09-01 ENCOUNTER — Encounter: Payer: MEDICARE | Attending: Pulmonary Disease | Admitting: *Deleted

## 2023-09-01 DIAGNOSIS — R0602 Shortness of breath: Secondary | ICD-10-CM | POA: Diagnosis present

## 2023-09-01 NOTE — Progress Notes (Signed)
 Daily Session Note  Patient Details  Name: Deanna [name] MRN: [id number] Date of Birth: 1946-03-31 Referring Provider:   Flowsheet Row Pulmonary Rehab from 06/30/2023 in [facility] Cardiac and Pulmonary Rehab  Referring Provider Janann Colonel, MD       Encounter Date: 09/01/2023  Check In:  Session Check In - 09/01/23 1137       Check-In   Supervising physician immediately available to respond to emergencies See telemetry face sheet for immediately available ER MD    Location [facility]-Cardiac & Pulmonary Rehab    Staff Present Cora Collum, RN, BSN, CCRP;Joseph Hood RCP,RRT,BSRT;Maxon Collbran BS, Exercise Physiologist;Noah Tickle, BS, Exercise Physiologist;Jason Wallace Cullens RDN,LDN    Virtual Visit No    Medication changes reported     No    Fall or balance concerns reported    No    Warm-up and Cool-down Performed on first and last piece of equipment    Resistance Training Performed Yes    VAD Patient? No    PAD/SET Patient? No      Pain Assessment   Currently in Pain? No/denies                Social History   Tobacco Use  Smoking Status Former   Current packs/day: 0.00   Average packs/day: 0.5 packs/day for 30.0 years (15.0 ttl pk-yrs)   Types: Cigarettes   Start date: 01/01/1968   Quit date: 12/31/1997   Years since quitting: 25.6  Smokeless Tobacco Never  Tobacco Comments   Quit in 1999    Goals Met:  Proper associated with RPD/PD & O2 Sat Independence with exercise equipment Exercise tolerated well No report of concerns or symptoms today  Goals Unmet:  Not Applicable  Comments: Pt able to follow exercise prescription today without complaint.  Will continue to monitor for progression.    Dr. Bethann Punches is Medical Director for [facility] Cardiac Rehabilitation.  Dr. Vida Rigger is Medical Director for [facility] Pulmonary Rehabilitation.

## 2023-09-02 ENCOUNTER — Ambulatory Visit: Payer: MEDICARE | Admitting: Cardiology

## 2023-09-03 ENCOUNTER — Telehealth: Payer: Self-pay

## 2023-09-03 ENCOUNTER — Ambulatory Visit: Payer: MEDICARE

## 2023-09-03 NOTE — Telephone Encounter (Signed)
 Called and spoke with pt. Pt states she reached out to [facility] pharmacy ad they are able to override prescription and refill her Eliquis again. I advised pt to call our office if she has nay other issues

## 2023-09-03 NOTE — Telephone Encounter (Signed)
*  STAT* If patient is at the pharmacy, call can be transferred to refill team.   1. Which medications need to be refilled? (please list name of each medication and dose if known) apixaban (ELIQUIS) 5 MG TABS tablet   2. Which pharmacy/location (including street and city if local pharmacy) is medication to be sent to? [id number] - [place], [place] - [address] AT [address] [phone]   3. Do they need a 30 day or 90 day supply? Pt has misplaced her Eliquis bottle and has no meds to take, just picked up rx last week, requesting cb to discuss alternative

## 2023-09-06 ENCOUNTER — Encounter: Payer: MEDICARE | Admitting: *Deleted

## 2023-09-06 ENCOUNTER — Ambulatory Visit (HOSPITAL_BASED_OUTPATIENT_CLINIC_OR_DEPARTMENT_OTHER): Payer: MEDICARE

## 2023-09-06 DIAGNOSIS — R0602 Shortness of breath: Secondary | ICD-10-CM

## 2023-09-06 NOTE — Progress Notes (Signed)
 Daily Session Note  Patient Details  Name: Deanna [name] MRN: [id number] Date of Birth: 19-Dec-1945 Referring Provider:   Flowsheet Row Pulmonary Rehab from 06/30/2023 in [facility] Cardiac and Pulmonary Rehab  Referring Provider Janann Colonel, MD       Encounter Date: 09/06/2023  Check In:  Session Check In - 09/06/23 1121       Check-In   Supervising physician immediately available to respond to emergencies See telemetry face sheet for immediately available ER MD    Location [facility]-Cardiac & Pulmonary Rehab    Staff Present Cora Collum, RN, BSN, CCRP;Joseph Hood RCP,RRT,BSRT;Maxon [employer], Exercise Physiologist;Kelly [employer], ACSM CEP, Exercise Physiologist    Virtual Visit No    Medication changes reported     No    Fall or balance concerns reported    No    Warm-up and Cool-down Performed on first and last piece of equipment    Resistance Training Performed Yes    VAD Patient? No    PAD/SET Patient? No      Pain Assessment   Currently in Pain? No/denies                Social History   Tobacco Use  Smoking Status Former   Current packs/day: 0.00   Average packs/day: 0.5 packs/day for 30.0 years (15.0 ttl pk-yrs)   Types: Cigarettes   Start date: 01/01/1968   Quit date: 12/31/1997   Years since quitting: 25.6  Smokeless Tobacco Never  Tobacco Comments   Quit in 1999    Goals Met:  Proper associated with RPD/PD & O2 Sat Independence with exercise equipment Exercise tolerated well No report of concerns or symptoms today  Goals Unmet:  Not Applicable  Comments: Pt able to follow exercise prescription today without complaint.  Will continue to monitor for progression.    Dr. Bethann Punches is Medical Director for [facility] Cardiac Rehabilitation.  Dr. Vida Rigger is Medical Director for [facility] Pulmonary Rehabilitation.

## 2023-09-08 ENCOUNTER — Encounter: Payer: Self-pay | Admitting: *Deleted

## 2023-09-08 DIAGNOSIS — R0602 Shortness of breath: Secondary | ICD-10-CM

## 2023-09-08 NOTE — Progress Notes (Signed)
 Pulmonary Individual Treatment Plan  Patient Details  Name: Deanna [name] MRN: [id number] Date of Birth: May 11, 1946 Referring Provider:   Flowsheet Row Pulmonary Rehab from 06/30/2023 in [facility] Pulmonary Re[facility]  Referring Provider Assaker, [place], MD       Initial Encounter Date:  Flowsheet Row Pulmonary Rehab from 06/30/2023 in [facility] Cardiac and Pulmonary Rehab  Date 06/30/23       Visit Diagnosis: SOB (shortness of breath)  Patient's Home Medications on Admission:  Current Outpatient Medications:    albuterol (VENTOLIN HFA) 108 (90 Base) MCG/ACT inhaler, Inhale 2 puffs into the lungs every 6 (six) hours as needed for wheezing or shortness of breath., Disp: 8 g, Rfl: 2   apixaban (ELIQUIS) 5 MG TABS tablet, Take 1 tablet (5 mg total) by mouth 2 (two) times daily. Hold medication from 9/15 to 9/21 for right hip surgery, Disp: 60 tablet, Rfl: 3   Ascorbic Acid (VITAMIN C) 1000 MG tablet, Take 4,000 mg by mouth daily., Disp: , Rfl:    azithromycin (ZITHROMAX) 250 MG tablet, Take 1 tablet (250 mg total) by mouth daily. Take first 2 tablets together, then 1 every day until finished., Disp: 6 tablet, Rfl: 0   budesonide-formoterol (SYMBICORT) 160-4.5 MCG/ACT inhaler, Inhale 2 puffs into the lungs in the morning and at bedtime., Disp: 1 each, Rfl: 12   Calcium-Magnesium (CAL-MAG PO), Take 2,000 mg by mouth at bedtime. 1000 mg, Disp: , Rfl:    Carboxymethylcellul-Glycerin (CLEAR EYES FOR DRY EYES OP), Place 1 drop into both eyes daily., Disp: , Rfl:    [employer] MAGNESIUM OXIDE 250 MG TABS, TAKE 1 TABLET (250 MG TOTAL) BY MOUTH EVERY EVENING. TAKE WITH EVENING MEAL, Disp: 100 tablet, Rfl: 1   diltiazem (CARDIZEM SR) 120 MG 12 hr capsule, Take 1 capsule (120 mg total) by mouth every 12 (twelve) hours., Disp: 60 capsule, Rfl: 6   furosemide (LASIX) 20 MG tablet, Take 20 mg by mouth as needed., Disp: , Rfl:    hydrochlorothiazide (HYDRODIURIL) 25 MG tablet, Take 1 tablet (25 mg total)  by mouth 2 (two) times daily., Disp: 180 tablet, Rfl: 3   HYDROcodone-acetaminophen (NORCO/VICODIN) 5-325 MG tablet, Take 1-2 tablets by mouth every 4 (four) hours as needed for severe pain., Disp: 42 tablet, Rfl: 0   loratadine (CLARITIN) 10 MG tablet, Take 10 mg by mouth daily as needed for allergies., Disp: , Rfl:    losartan (COZAAR) 50 MG tablet, TAKE 1 TABLET BY MOUTH EVERY DAY, Disp: 90 tablet, Rfl: 1   methocarbamol (ROBAXIN) 500 MG tablet, Take 1 tablet (500 mg total) by mouth every 6 (six) hours as needed for muscle spasms (muscle pain)., Disp: 40 tablet, Rfl: 2   metoprolol tartrate (LOPRESSOR) 25 MG tablet, Take 1 tablet (25 mg total) by mouth 2 (two) times daily., Disp: 60 tablet, Rfl: 6   milk thistle 175 MG tablet, Take 175 mg by mouth daily., Disp: , Rfl:    nitroGLYCERIN (NITROSTAT) 0.4 MG SL tablet, Place 1 tablet (0.4 mg total) under the tongue every 5 (five) minutes x 3 doses as needed for chest pain., Disp: 25 tablet, Rfl: 1   polyethylene glycol (MIRALAX / GLYCOLAX) 17 g packet, Take 17 g by mouth 2 (two) times daily., Disp: 14 each, Rfl: 0   Probiotic Product (PROBIOTIC DAILY PO), Take 1 tablet by mouth daily., Disp: , Rfl:    rosuvastatin (CRESTOR) 20 MG tablet, Take 1 tablet (20 mg total) by mouth daily., Disp: 30 tablet, Rfl:  6   umeclidinium bromide (INCRUSE ELLIPTA) 62.5 MCG/ACT AEPB, Inhale 1 puff into the lungs daily., Disp: 1 each, Rfl: 6   Vitamin A 2400 MCG (8000 UT) CAPS, Take 16,000 Units by mouth daily. 8000 units, Disp: , Rfl:    Vitamin D, Ergocalciferol, (DRISDOL) 1.25 MG (50000 UNIT) CAPS capsule, Take 50,000 Units by mouth once a week., Disp: , Rfl:   Past Medical History: Past Medical History:  Diagnosis Date   A-fib (HCC)    Abnormal glucose 03/06/2020   Acute coronary syndrome (HCC) 02/09/2023   Age-related osteoporosis without current pathological fracture 11/22/2015   Overview:  Femoral neck  Formatting of this note might be different from the  original. 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 Added automatically from request for surgery [id number]   Chronic pain syndrome 04/01/2015   Colon polyps    Dyspnea    Essential hypertension 07/20/2000   Overview:  HBP   Family history of adverse reaction to anesthesia    mother delerium   Female cystocele 02/27/2009   GAD (generalized anxiety disorder) 08/19/2015   GERD (gastroesophageal reflux disease)    Heart murmur    History of Bell's palsy 03/21/2015   Hx of adenomatous colonic polyps 12/20/2008   Overview:  Colonoscopy 07/2003 - LMD - polyps removed - recommended repeat in 3 years Colonoscopy 09/2011 - Dr. Darrol Jump - hyperplastic polyp(s) were removed - repeat in 5 years   Hypertension    Incomplete uterovaginal prolapse 03/04/2004   Insomnia    Left bundle-branch block 05/20/2006   Lumbar radiculopathy 01/09/2016   Formatting of this note might be different from the original. 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 Femoral neck   Other intervertebral disc degeneration, lumbar region 01/29/2015   Pre-operative cardiovascular examination 09/03/2020   Primary osteoarthritis, right hand 02/28/2002   Overview:  OA HANDS, ESP THUMBS   Radial tunnel syndrome, right  11/04/2016   Radiculopathy, lumbar region 11/22/2014   Overview:  Added automatically from request for surgery [id number]   Rhinitis, allergic 02/24/2002   Overview:  ALLERGY SYMPTOMS - states was seen by allergist in past and informed allergic to dust   S/P left total hip arthroplasty 09/17/2020   S/P total right hip arthroplasty 02/18/2023   Sacroiliitis (HCC) 08/02/2019   Screening for colon cancer 04/17/2005   Formatting of this note might be different from the original. 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 TRIGGER  THUMBS - started spring 2003. Injected 02/28/02   Unspecified asthma, uncomplicated 12/25/2004    Tobacco Use: Social History   Tobacco Use  Smoking Status Former   Current packs/day: 0.00   Average packs/day: 0.5 packs/day for 30.0 years (15.0 ttl pk-yrs)   Types: Cigarettes   Start date: 01/01/1968   Quit date: 12/31/1997   Years since quitting: 25.7  Smokeless Tobacco Never  Tobacco Comments   Quit in 1999    Labs: Review Flowsheet  More data exists      Latest Ref Rng & Units 01/23/2021 06/26/2021 11/04/2021 02/10/2023 02/11/2023  Labs for ITP Cardiac and Pulmonary Rehab  Cholestrol 0 - 200 mg/dL - - 161  096  -  LDL (calc) 0 - 99 mg/dL - - 89  96  -  HDL-C >04 mg/dL - - 54  42  -  Trlycerides <150 mg/dL - - 47  540  -  Hemoglobin A1c 4.8 - 5.6 % 6.1  6.0  5.8  - 6.0      Pulmonary Assessment Scores:  Pulmonary Assessment Scores     Row Name 07/07/23 1348         ADL UCSD   ADL Phase Entry     SOB Score total 76     Rest 1     Walk 4     Stairs 5     Bath 2     Dress 3     Shop 3       CAT Score    CAT Score 21              UCSD: Self-administered rating of dyspnea associated with activities of daily living (ADLs) 6-point scale (0 = "not at all" to 5 = "maximal or unable to do because of breathlessness")  Scoring Scores range from 0 to 120.  Minimally important difference is 5 units  CAT: CAT can identify the health impairment of COPD patients and is better correlated with disease progression.  CAT has a scoring range of zero to 40. The CAT score is classified into four groups of low (less than 10), medium (10 - 20), high (21-30) and very high (31-40) based on the impact level of disease on health status. A CAT score over 10 suggests significant symptoms.  A worsening CAT score could be explained by an exacerbation, poor medication adherence, poor inhaler technique, or progression of COPD or comorbid conditions.  CAT MCID is 2 points  mMRC: mMRC (Modified Medical Research Council) Dyspnea Scale is used to assess the degree of baseline functional disability in patients of respiratory disease due to dyspnea. No minimal important difference is established. A decrease in score of 1 point or greater is considered a positive change.   Pulmonary Function Assessment:  Pulmonary Function Assessment - 06/28/23 1027       Breath   Shortness of Breath Yes;Limiting activity;Fear of Shortness of Breath             Exercise Target Goals: Exercise Program Goal: Individual exercise prescription set using results from initial 6 min walk test and THRR while considering  patient's activity barriers and safety.   Exercise Prescription Goal: Initial exercise prescription builds to 30-45 minutes a day of aerobic activity, 2-3 days per week.  Home exercise guidelines will be given to patient during program as part of exercise prescription that the participant will acknowledge.  Education: Aerobic Exercise: - Group verbal and visual presentation on the components of exercise prescription.  Introduces F.I.T.T principle from ACSM for exercise prescriptions.  Reviews F.I.T.T. principles of aerobic exercise including progression. Written material given at graduation.   Education: Resistance Exercise: - Group verbal and visual presentation on the components of exercise prescription. Introduces F.I.T.T principle from ACSM for exercise prescriptions  Reviews F.I.T.T. principles of resistance exercise including progression. Written material given at graduation.    Education: Exercise & Equipment Safety: - Individual verbal instruction and demonstration of equipment use and safety with use of the equipment. Flowsheet Row Pulmonary Rehab from 06/30/2023 in [facility] Cardiac and Pulmonary Rehab  Date 06/30/23  Educator MB  Instruction Review Code 1- Verbalizes Understanding       Education: Exercise Physiology & General Exercise Guidelines: - Group verbal and written instruction with models to review the exercise physiology of the cardiovascular system and associated critical values. Provides general exercise guidelines with specific guidelines to those with heart or lung disease.    Education: Flexibility, Balance, Mind/Body Relaxation: - Group verbal and visual presentation with interactive activity on the components of exercise prescription. Introduces F.I.T.T principle from ACSM for exercise prescriptions. Reviews F.I.T.T. principles of flexibility and balance exercise training including progression. Also discusses the mind body connection.  Reviews various relaxation techniques to help reduce and manage stress (i.e. Deep breathing, progressive muscle relaxation, and visualization). Balance handout provided to take home. Written material given at graduation.   Activity Barriers & Risk Stratification:  Activity Barriers & Cardiac Risk Stratification - 06/30/23 1617       Activity Barriers & Cardiac Risk Stratification   Activity Barriers Arthritis;Left Hip Replacement;Right Hip  Replacement;Back Problems;Other (comment);Shortness of Breath    Comments R patella fracture             6 Minute Walk:  6 Minute Walk     Row Name 06/30/23 1615         6 Minute Walk   Phase Initial     Distance 600 feet     Walk Time 5.5 minutes     # of Rest Breaks 1     MPH 1.24     METS 1.38     RPE 15     Perceived Dyspnea  3     VO2 Peak 4.83     Symptoms Yes (comment)     Comments SOB     Resting HR 73 bpm     Resting BP 140/60     Resting Oxygen Saturation  92 %     Exercise Oxygen Saturation  during 6 min walk 92 %     Max Ex. HR 96 bpm     Max Ex. BP 160/66     2 Minute Post BP 138/70       Interval HR   1 Minute HR 86     2 Minute HR 91     3 Minute HR 93     4 Minute HR 93     5 Minute HR 96     6 Minute HR 93     2 Minute Post HR 77     Interval Heart Rate? Yes       Interval Oxygen   Interval Oxygen? Yes     Baseline Oxygen Saturation % 92 %     1 Minute Oxygen Saturation % 93 %     1 Minute Liters of Oxygen 0 L     2 Minute Oxygen Saturation % 92 %     2 Minute Liters of Oxygen 0  L     3 Minute Oxygen Saturation % 93 %     3 Minute Liters of Oxygen 0 L     4 Minute Oxygen Saturation % 93 %     4 Minute Liters of Oxygen 0 L     5 Minute Oxygen Saturation % 92 %     5 Minute Liters of Oxygen 0 L     6 Minute Oxygen Saturation % 92 %     6 Minute Liters of Oxygen 0 L     2 Minute Post Oxygen Saturation % 94 %     2 Minute Post Liters of Oxygen 0 L             Oxygen Initial Assessment:  Oxygen Initial Assessment - 06/28/23 1026       Home Oxygen   Home Oxygen Device None    Sleep Oxygen Prescription None    Home Exercise Oxygen Prescription None    Home Resting Oxygen Prescription None      Initial 6 min Walk   Oxygen Used None      Program Oxygen Prescription   Program Oxygen Prescription None      Intervention   Short Term Goals To learn and exhibit compliance with exercise, home and travel O2 prescription;To learn  and understand importance of monitoring SPO2 with pulse oximeter and demonstrate accurate use of the pulse oximeter.;To learn and understand importance of maintaining oxygen saturations>88%;To learn and demonstrate proper pursed lip breathing techniques or other breathing techniques. ;To learn and demonstrate proper use of respiratory medications    Long  Term Goals Exhibits compliance with exercise, home  and travel O2 prescription;Verbalizes importance of monitoring SPO2 with pulse oximeter and return demonstration;Maintenance of O2 saturations>88%;Exhibits proper breathing techniques, such as pursed lip breathing or other method taught during program session;Compliance with respiratory medication;Demonstrates proper use of MDI's             Oxygen Re-Evaluation:  Oxygen Re-Evaluation     Row Name 07/07/23 1137 08/09/23 1124 09/01/23 1113         Program Oxygen Prescription   Program Oxygen Prescription None None None       Home Oxygen   Home Oxygen Device None None None     Sleep Oxygen Prescription None None None     Home Exercise Oxygen Prescription None None None     Home Resting Oxygen Prescription None None None     Compliance with Home Oxygen Use Yes Yes --       Goals/Expected Outcomes   Short Term Goals To learn and demonstrate proper pursed lip breathing techniques or other breathing techniques.  To learn and demonstrate proper pursed lip breathing techniques or other breathing techniques.  To learn and understand importance of maintaining oxygen saturations>88%;To learn and understand importance of monitoring SPO2 with pulse oximeter and demonstrate accurate use of the pulse oximeter.     Long  Term Goals Exhibits proper breathing techniques, such as pursed lip breathing or other method taught during program session Exhibits proper breathing techniques, such as pursed lip breathing or other method taught during program session Maintenance of O2 saturations>88%;Verbalizes  importance of monitoring SPO2 with pulse oximeter and return demonstration     Comments Reviewed PLB technique with pt.  Talked about how it works and it's importance in maintaining their exercise saturations. -- She does not have a pulse oximeter to check her oxygen saturation at home. Informed her where to get one and  explained why it is important to have one. Reviewed that oxygen saturations should be 88 percent and above. Patient verbalizes understanding.     Goals/Expected Outcomes Short: Become more profiecient at using PLB. Long: Become independent at using PLB. -- Short: monitor oxygen at home with exertion. Long: maintain oxygen saturations above 88 percent independently.              Oxygen Discharge (Final Oxygen Re-Evaluation):  Oxygen Re-Evaluation - 09/01/23 1113       Program Oxygen Prescription   Program Oxygen Prescription None      Home Oxygen   Home Oxygen Device None    Sleep Oxygen Prescription None    Home Exercise Oxygen Prescription None    Home Resting Oxygen Prescription None      Goals/Expected Outcomes   Short Term Goals To learn and understand importance of maintaining oxygen saturations>88%;To learn and understand importance of monitoring SPO2 with pulse oximeter and demonstrate accurate use of the pulse oximeter.    Long  Term Goals Maintenance of O2 saturations>88%;Verbalizes importance of monitoring SPO2 with pulse oximeter and return demonstration    Comments She does not have a pulse oximeter to check her oxygen saturation at home. Informed her where to get one and explained why it is important to have one. Reviewed that oxygen saturations should be 88 percent and above. Patient verbalizes understanding.    Goals/Expected Outcomes Short: monitor oxygen at home with exertion. Long: maintain oxygen saturations above 88 percent independently.             Initial Exercise Prescription:  Initial Exercise Prescription - 06/30/23 1600       Date of  Initial Exercise RX and Referring Provider   Date 06/30/23    Referring Provider Assaker, [place], MD      Oxygen   Maintain Oxygen Saturation 88% or higher      NuStep   Level 1    SPM 80    Minutes 15    METs 1.38      Arm Ergometer   Level 1    RPM 30    Minutes 15    METs 1.38      Biostep-RELP   Level 1    SPM 50    Minutes 15    METs 1.38      Track   Laps 9    Minutes 15    METs 1.49      Prescription Details   Frequency (times per week) 2    Duration Progress to 30 minutes of continuous aerobic without signs/symptoms of physical distress      Intensity   THRR 40-80% of Max Heartrate 101-129    Ratings of Perceived Exertion 11-13    Perceived Dyspnea 0-4      Progression   Progression Continue to progress workloads to maintain intensity without signs/symptoms of physical distress.      Resistance Training   Training Prescription Yes    Weight 2 lb upper, 4 lb lower    Reps 10-15             Perform Capillary Blood Glucose checks as needed.  Exercise Prescription Changes:   Exercise Prescription Changes     Row Name 06/30/23 1600 07/21/23 1100 08/05/23 0800 08/19/23 1800 08/23/23 1100     Response to Exercise   Blood Pressure (Admit) 140/60 120/62 122/64 118/74 --   Blood Pressure (Exercise) 160/66 148/70 142/80 140/80 --   Blood Pressure (Exit) 138/70 118/60  118/62 122/62 --   Heart Rate (Admit) 73 bpm 80 bpm 78 bpm 74 bpm --   Heart Rate (Exercise) 96 bpm 102 bpm 88 bpm 84 bpm --   Heart Rate (Exit) 77 bpm 83 bpm 74 bpm 76 bpm --   Oxygen Saturation (Admit) 92 % 94 % 91 % 92 % --   Oxygen Saturation (Exercise) 92 % 93 % 90 % 92 % --   Oxygen Saturation (Exit) 94 % 96 % 95 % 92 % --   Rating of Perceived Exertion (Exercise) 15 14 15 15  --   Perceived Dyspnea (Exercise) 3 4 3 2  --   Symptoms SOB SOB SOB SOB --   Comments results First two days of exercise in rehab -- -- --   Duration Progress to 30 minutes of  aerobic without  signs/symptoms of physical distress Continue with 30 min of aerobic exercise without signs/symptoms of physical distress. Continue with 30 min of aerobic exercise without signs/symptoms of physical distress. Continue with 30 min of aerobic exercise without signs/symptoms of physical distress. Continue with 30 min of aerobic exercise without signs/symptoms of physical distress.   Intensity THRR New THRR unchanged THRR unchanged THRR unchanged THRR unchanged     Progression   Progression Continue to progress workloads to maintain intensity without signs/symptoms of physical distress. Continue to progress workloads to maintain intensity without signs/symptoms of physical distress. Continue to progress workloads to maintain intensity without signs/symptoms of physical distress. Continue to progress workloads to maintain intensity without signs/symptoms of physical distress. Continue to progress workloads to maintain intensity without signs/symptoms of physical distress.   Average METs 1.38 1.83 1.9 1.89 1.89     Resistance Training   Training Prescription -- Yes Yes Yes Yes   Weight -- 2 lb 2 lb 2 lb 2 lb   Reps -- 10-15 10-15 10-15 10-15     Interval Training   Interval Training -- No No No No     Treadmill   MPH -- -- 1 -- --   Grade -- -- 0 -- --   Minutes -- -- 15 -- --   METs -- -- 1.77 -- --     NuStep   Level -- 1 4 2 2    Minutes -- 15 15 15 15    METs -- 2.7 2.4 2.2 2.2     T5 Nustep   Level -- 1 1  T6 nustep -- --   Minutes -- 15 15 -- --   METs -- 1.8 1.7 -- --     Biostep-RELP   Level -- -- -- 1 1   Minutes -- -- -- 15 15   METs -- -- -- 2 2     Track   Laps -- 9 -- 12 12   Minutes -- 15 -- 15 15   METs -- 1.49 -- 1.65 1.65     Home Exercise Plan   Plans to continue exercise at -- -- -- -- [employer] (comment)  fitness center in home community   Frequency -- -- -- -- Add 2 additional days to program exercise sessions.   Initial Home Exercises Provided -- --  -- -- 08/23/23     Oxygen   Maintain Oxygen Saturation -- 88% or higher 88% or higher 88% or higher 88% or higher    Row Name 09/02/23 0700             Response to Exercise   Blood Pressure (Admit) 122/68  Blood Pressure (Exercise) 140/62       Blood Pressure (Exit) 118/60       Heart Rate (Admit) 67 bpm       Heart Rate (Exercise) 90 bpm       Heart Rate (Exit) 74 bpm       Oxygen Saturation (Admit) 95 %       Oxygen Saturation (Exercise) 93 %       Oxygen Saturation (Exit) 94 %       Rating of Perceived Exertion (Exercise) 14       Perceived Dyspnea (Exercise) 3       Symptoms none       Duration Continue with 30 min of aerobic exercise without signs/symptoms of physical distress.       Intensity THRR unchanged         Progression   Progression Continue to progress workloads to maintain intensity without signs/symptoms of physical distress.       Average METs 1.64         Resistance Training   Training Prescription Yes       Weight 2 lb       Reps 10-15         Interval Training   Interval Training No         NuStep   Level 3       Minutes 15       METs 2.8         T5 Nustep   Level 1  T6       Minutes 15         Track   Laps 5       Minutes 15       METs 1.27         Home Exercise Plan   Plans to continue exercise at [employer] (comment)  fitness center in home community       Frequency Add 2 additional days to program exercise sessions.       Initial Home Exercises Provided 08/23/23         Oxygen   Maintain Oxygen Saturation 88% or higher                Exercise Comments:   Exercise Comments     Row Name 07/07/23 1138           Exercise Comments First full day of exercise!  Patient was oriented to gym and equipment including functions, settings, policies, and procedures.  Patient's individual exercise prescription and treatment plan were reviewed.  All starting workloads were established based on the results of the 6 minute  walk test done at initial orientation visit.  The plan for exercise progression was also introduced and progression will be customized based on patient's performance and goals.                Exercise Goals and Review:   Exercise Goals     Row Name 06/30/23 1621             Exercise Goals   Increase Physical Activity Yes       Intervention Provide advice, education, support and counseling about physical activity/exercise needs.;Develop an individualized exercise prescription for aerobic and resistive training based on initial evaluation findings, risk stratification, comorbidities and participant's personal goals.       Expected Outcomes Short Term: Attend rehab on a regular basis to increase amount of physical activity.;Long Term: Add in home exercise to make  exercise part of routine and to increase amount of physical activity.;Long Term: Exercising regularly at least 3-5 days a week.       Increase Strength and Stamina Yes       Intervention Provide advice, education, support and counseling about physical activity/exercise needs.;Develop an individualized exercise prescription for aerobic and resistive training based on initial evaluation findings, risk stratification, comorbidities and participant's personal goals.       Expected Outcomes Short Term: Increase workloads from initial exercise prescription for resistance, speed, and METs.;Short Term: Perform resistance training exercises routinely during rehab and add in resistance training at home;Long Term: Improve cardiorespiratory fitness, muscular endurance and strength as measured by increased METs and functional capacity ( )       Able to understand and use rate of perceived exertion (RPE) scale Yes       Intervention Provide education and explanation on how to use RPE scale       Expected Outcomes Short Term: Able to use RPE daily in rehab to express subjective intensity level;Long Term:  Able to use RPE to guide intensity level  when exercising independently       Able to understand and use Dyspnea scale Yes       Intervention Provide education and explanation on how to use Dyspnea scale       Expected Outcomes Short Term: Able to use Dyspnea scale daily in rehab to express subjective sense of shortness of breath during exertion;Long Term: Able to use Dyspnea scale to guide intensity level when exercising independently       Knowledge and understanding of Target Heart Rate Range (THRR) Yes       Intervention Provide education and explanation of THRR including how the numbers were predicted and where they are located for reference       Expected Outcomes Short Term: Able to state/look up THRR;Short Term: Able to use daily as guideline for intensity in rehab;Long Term: Able to use THRR to govern intensity when exercising independently       Able to check pulse independently Yes       Intervention Provide education and demonstration on how to check pulse in carotid and radial arteries.;Review the importance of being able to check your own pulse for safety during independent exercise       Expected Outcomes Short Term: Able to explain why pulse checking is important during independent exercise;Long Term: Able to check pulse independently and accurately       Understanding of Exercise Prescription Yes       Intervention Provide education, explanation, and written materials on patient's individual exercise prescription       Expected Outcomes Short Term: Able to explain program exercise prescription;Long Term: Able to explain home exercise prescription to exercise independently                Exercise Goals Re-Evaluation :  Exercise Goals Re-Evaluation     Row Name 07/07/23 1136 07/21/23 1136 08/05/23 0842 08/09/23 1122 08/19/23 1828     Exercise Goal Re-Evaluation   Exercise Goals Review Able to understand and use rate of perceived exertion (RPE) scale;Able to understand and use Dyspnea scale;Knowledge and understanding  of Target Heart Rate Range (THRR);Understanding of Exercise Prescription Increase Physical Activity;Increase Strength and Stamina;Understanding of Exercise Prescription Increase Physical Activity;Increase Strength and Stamina;Understanding of Exercise Prescription Increase Physical Activity;Increase Strength and Stamina;Understanding of Exercise Prescription Increase Physical Activity;Increase Strength and Stamina;Understanding of Exercise Prescription   Comments Reviewed RPE and dyspnea scale,  THR and program prescription with pt today.  Pt voiced understanding and was given a copy of goals to take home. Wallace Cullens is off to a good start in rehab. She was able to walk up to 9 laps on the track. She also did well at level 1 on the T4 and T5 nustep machines. We will continue to monitor her progress in the program. Wallace Cullens is doing well in rehab. She began using the treadmill and did well at 1 mph with no incline. She also improved to level 4 on the T4 nustep and did well with 2 lb hand weights for resistance training. We will continue to monitor her progress in the program. Wallace Cullens is doing well here at rehab, reports she is not doing much at home. Encouraged her to increase workload here at rehab as able. She says some days she is exhausted after exercise and affects her ability to do daily task. Wallace Cullens continues to do well in the program. She was recently able to increase her laps on the track from 9 to 12. We will continue to monitor her progress in the program.   Expected Outcomes Short: Use RPE daily to regulate intensity. Long: Follow program prescription in THR. Short: Continue to follow current exercise prescription. Long: Continue exercise to improve strength and stamina. Short: Continue to progressively increase workloads. Long: Continue exercise to improve strength and stamina. STG: increase workload as able. LTG: Continue exercise to improve strength and stamina Short: Increase level on the Biostep to 2. Long:  Continue exercise to improve strength and stamina.    Row Name 08/23/23 1159 09/02/23 0759           Exercise Goal Re-Evaluation   Exercise Goals Review Increase Physical Activity;Able to understand and use rate of perceived exertion (RPE) scale;Knowledge and understanding of Target Heart Rate Range (THRR);Understanding of Exercise Prescription;Increase Strength and Stamina;Able to understand and use Dyspnea scale;Able to check pulse independently Increase Physical Activity;Understanding of Exercise Prescription;Increase Strength and Stamina      Comments Reviewed home exercise with pt today from 11:20 am to 11:32 am.  Pt plans to finish PT and then start going to her community fitness center for exercise.  Reviewed THR, pulse, RPE, sign and symptoms, pulse oximetery and when to call 911 or MD.  Also discussed weather considerations and indoor options.  Pt voiced understanding. Deanna [name] is doing well in rehab. She increased to level 3 on the T4 nustep. She decreased to 5 laps from 12 laps on the track in this review. We will continue to monitor her progress in the program.      Expected Outcomes Short: add1-2 days a week of exercise at home on off days of rehab. Long: maintain independent exercise routine. Short: Push for more laps on the track to get back to 12 laps. Long: Continue exercise to improve strength and stamina.               Discharge Exercise Prescription (Final Exercise Prescription Changes):  Exercise Prescription Changes - 09/02/23 0700       Response to Exercise   Blood Pressure (Admit) 122/68    Blood Pressure (Exercise) 140/62    Blood Pressure (Exit) 118/60    Heart Rate (Admit) 67 bpm    Heart Rate (Exercise) 90 bpm    Heart Rate (Exit) 74 bpm    Oxygen Saturation (Admit) 95 %    Oxygen Saturation (Exercise) 93 %    Oxygen Saturation (Exit) 94 %  Rating of Perceived Exertion (Exercise) 14    Perceived Dyspnea (Exercise) 3    Symptoms none    Duration Continue  with 30 min of aerobic exercise without signs/symptoms of physical distress.    Intensity THRR unchanged      Progression   Progression Continue to progress workloads to maintain intensity without signs/symptoms of physical distress.    Average METs 1.64      Resistance Training   Training Prescription Yes    Weight 2 lb    Reps 10-15      Interval Training   Interval Training No      NuStep   Level 3    Minutes 15    METs 2.8      T5 Nustep   Level 1   T6   Minutes 15      Track   Laps 5    Minutes 15    METs 1.27      Home Exercise Plan   Plans to continue exercise at [employer] (comment)   fitness center in home community   Frequency Add 2 additional days to program exercise sessions.    Initial Home Exercises Provided 08/23/23      Oxygen   Maintain Oxygen Saturation 88% or higher             Nutrition:  Target Goals: Understanding of nutrition guidelines, daily intake of sodium 1500mg , cholesterol 200mg , calories 30% from fat and 7% or less from saturated fats, daily to have 5 or more servings of fruits and vegetables.  Education: All About Nutrition: -Group instruction provided by verbal, written material, interactive activities, discussions, models, and posters to present general guidelines for heart healthy nutrition including fat, fiber, MyPlate, the role of sodium in heart healthy nutrition, utilization of the nutrition label, and utilization of this knowledge for meal planning. Follow up email sent as well. Written material given at graduation.   Biometrics:  Pre Biometrics - 06/30/23 1622       Pre Biometrics   Height 5' 3.5" (1.613 m)    Weight 165 lb 12.8 oz (75.2 kg)    Waist Circumference 39.5 inches    Hip Circumference 44.9 inches    Waist to Hip Ratio 0.88 %    BMI (Calculated) 28.91    Single Leg Stand 6.9 seconds              Nutrition Therapy Plan and Nutrition Goals:  Nutrition Therapy & Goals - 06/30/23 1605        Nutrition Therapy   Diet Carb controlled, cardiac, low Na    Protein (specify units) 75g    Fiber 25 grams    Whole Grain Foods 3 servings    Saturated Fats 15 max. grams    Fruits and Vegetables 5 servings/day    Sodium 2 grams      Personal Nutrition Goals   Nutrition Goal Eat 15-30gProtein and 30-60gCarbs at each meal.    Personal Goal #2 Practice mindful snacking and limit sugary treats to a few times per week    Personal Goal #3 Read labels and reduce sodium intake to below 2300mg . Ideally 1500mg  per day.    Comments Patient drinking ~60oz of water daily. She usually eats 3 times per day, likes fruits and veggies. Doesn't eat much animal meat, but does like fish and some chicken. She struggles with snacking and sugary foods. Her A1C is ~6.0%. She is aware of carb choices and portions. Reviewed mediterranean diet  handout, educated on types of fats, sources, and how to read label. Provided sample meal plates handout and build out meals and snacks with foods she likes and will eat, focusing on adequate protein while controlling complex carb portions and including lots of colorful produce.      Intervention Plan   Intervention Prescribe, educate and counsel regarding individualized specific dietary modifications aiming towards targeted core components such as weight, hypertension, lipid management, diabetes, heart failure and other comorbidities.;Nutrition handout(s) given to patient.    Expected Outcomes Short Term Goal: Understand basic principles of dietary content, such as calories, fat, sodium, cholesterol and nutrients.;Short Term Goal: A plan has been developed with personal nutrition goals set during dietitian appointment.;Long Term Goal: Adherence to prescribed nutrition plan.             Nutrition Assessments:  MEDIFICTS Score Key: >=70 Need to make dietary changes  40-70 Heart Healthy Diet <= 40 Therapeutic Level Cholesterol Diet  Flowsheet Row Pulmonary Rehab from  07/07/2023 in [facility] Cardiac and Pulmonary Rehab  Picture Your Plate Total Score on Admission 72      Picture Your Plate Scores: <21 Unhealthy dietary pattern with much room for improvement. 41-50 Dietary pattern unlikely to meet recommendations for good health and room for improvement. 51-60 More healthful dietary pattern, with some room for improvement.  >60 Healthy dietary pattern, although there may be some specific behaviors that could be improved.   Nutrition Goals Re-Evaluation:  Nutrition Goals Re-Evaluation     Row Name 08/09/23 1128 09/01/23 1117           Goals   Comment Spoke with her about healthy eating, including lots of colorful produce and quality protein. Controlling carb choices to pick complex carbs over refined, processed and concentrated sugary products. Patient was informed on why it is important to maintain a balanced diet when dealing with Respiratory issues. Explained that it takes a lot of energy to breath and when they are short of breath often they will need to have a good diet to help keep up with the calories they are expending for breathing.      Expected Outcome STG: meet protein goal of >70g. LTG: follow a heart healthy diet and lifestyle plan Short: Choose and plan snacks accordingly to patients caloric intake to improve breathing. Long: Maintain a diet independently that meets their caloric intake to aid in daily shortness of breath.               Nutrition Goals Discharge (Final Nutrition Goals Re-Evaluation):  Nutrition Goals Re-Evaluation - 09/01/23 1117       Goals   Comment Patient was informed on why it is important to maintain a balanced diet when dealing with Respiratory issues. Explained that it takes a lot of energy to breath and when they are short of breath often they will need to have a good diet to help keep up with the calories they are expending for breathing.    Expected Outcome Short: Choose and plan snacks accordingly to patients  caloric intake to improve breathing. Long: Maintain a diet independently that meets their caloric intake to aid in daily shortness of breath.             Psychosocial: Target Goals: Acknowledge presence or absence of significant depression and/or stress, maximize coping skills, provide positive support system. Participant is able to verbalize types and ability to use techniques and skills needed for reducing stress and depression.   Education: Stress, Anxiety, and  Depression - Group verbal and visual presentation to define topics covered.  Reviews how body is impacted by stress, anxiety, and depression.  Also discusses healthy ways to reduce stress and to treat/manage anxiety and depression.  Written material given at graduation.   Education: Sleep Hygiene -Provides group verbal and written instruction about how sleep can affect your health.  Define sleep hygiene, discuss sleep cycles and impact of sleep habits. Review good sleep hygiene tips.    Initial Review & Psychosocial Screening:  Initial Psych Review & Screening - 06/28/23 1028       Initial Review   Current issues with Current Sleep Concerns;Current Stress Concerns    Source of Stress Concerns Chronic Illness      Family Dynamics   Good Support System? Yes    Comments Janani lives by herself and can call her daughterif she needs support. Sometimes she has problems sleeping due to her shortness of breath. She states no depression and does not take anything for her mood.      Barriers   Psychosocial barriers to participate in program The patient should benefit from training in stress management and relaxation.      Screening Interventions   Interventions Encouraged to exercise;To provide support and resources with identified psychosocial needs;Provide feedback about the scores to participant    Expected Outcomes Short Term goal: Utilizing psychosocial counselor, staff and physician to assist with identification of specific  Stressors or current issues interfering with healing process. Setting desired goal for each stressor or current issue identified.;Long Term Goal: Stressors or current issues are controlled or eliminated.;Short Term goal: Identification and review with participant of any Quality of Life or Depression concerns found by scoring the questionnaire.;Long Term goal: The participant improves quality of Life and PHQ9 Scores as seen by post scores and/or verbalization of changes             Quality of Life Scores:  Scores of 19 and below usually indicate a poorer quality of life in these areas.  A difference of  2-3 points is a clinically meaningful difference.  A difference of 2-3 points in the total score of the Quality of Life Index has been associated with significant improvement in overall quality of life, self-image, physical symptoms, and general health in studies assessing change in quality of life.  PHQ-9: Review Flowsheet  More data exists      09/01/2023 06/30/2023 01/14/2022 12/26/2020 12/21/2019  Depression screen PHQ 2/9  Decreased Interest 1 1 0 0 0  Down, Depressed, Hopeless 0 0 0 0 0  PHQ - 2 Score 1 1 0 0 0  Altered sleeping 1 2 - - -  Tired, decreased energy 1 2 - - -  Change in appetite 0 0 - - -  Feeling bad or failure about yourself  0 0 - - -  Trouble concentrating 0 1 - - -  Moving slowly or fidgety/restless 0 0 - - -  Suicidal thoughts 0 0 - - -  PHQ-9 Score 3 6 - - -  Difficult doing work/chores Somewhat difficult Very difficult - - -   Interpretation of Total Score  Total Score Depression Severity:  1-4 = Minimal depression, 5-9 = Mild depression, 10-14 = Moderate depression, 15-19 = Moderately severe depression, 20-27 = Severe depression   Psychosocial Evaluation and Intervention:  Psychosocial Evaluation - 06/28/23 1030       Psychosocial Evaluation & Interventions   Interventions Relaxation education;Stress management education;Encouraged to exercise with the  program and follow exercise prescription    Comments Deanna [name] lives by herself and can call her daughterif she needs support. Sometimes she has problems sleeping due to her shortness of breath. She states no depression and does not take anything for her mood.    Expected Outcomes Short: Attend [facility] stress management education to decrease stress. Long: Maintain exercise Post [facility] to keep stress at a minimum.    Continue Psychosocial Services  Follow up required by staff             Psychosocial Re-Evaluation:  Psychosocial Re-Evaluation     Row Name 08/09/23 1125 09/01/23 1126           Psychosocial Re-Evaluation   Current issues with Current Sleep Concerns Current Stress Concerns      Comments Deanna [name] states she isnt not experiencing any anxiety depression or stress currently. Says she has a good support system of friends that have similar health issues. She says she has always struggled with sleep, some days she sleeps well others not well at all. She does take naps during the day. Days she exercises, she often takes a nap, but then struggles to sleep later in the night. Reviewed patient health questionnaire (PHQ-9) with patient for follow up. Previously, patients score indicated signs/symptoms of depression.  Reviewed to see if patient is improving symptom wise while in program.  Score improved and patient states that it is because she has been able to exercise more.      Expected Outcomes STG: continue to focus on getting good rest from naps and sleep. LTG: maintain a positive outlook on health and daily life Short: Continue to attend [facility] regularly for regular exercise and social engagement. Long: Continue to improve symptoms and manage a positive mental state.      Interventions Encouraged to attend Pulmonary Rehabilitation for the exercise Encouraged to attend Pulmonary Rehabilitation for the exercise      Continue Psychosocial Services  Follow up required by staff  Follow up required by staff               Psychosocial Discharge (Final Psychosocial Re-Evaluation):  Psychosocial Re-Evaluation - 09/01/23 1126       Psychosocial Re-Evaluation   Current issues with Current Stress Concerns    Comments Reviewed patient health questionnaire (PHQ-9) with patient for follow up. Previously, patients score indicated signs/symptoms of depression.  Reviewed to see if patient is improving symptom wise while in program.  Score improved and patient states that it is because she has been able to exercise more.    Expected Outcomes Short: Continue to attend [facility] regularly for regular exercise and social engagement. Long: Continue to improve symptoms and manage a positive mental state.    Interventions Encouraged to attend Pulmonary Rehabilitation for the exercise    Continue Psychosocial Services  Follow up required by staff             Education: Education Goals: Education classes will be provided on a weekly basis, covering required topics. Participant will state understanding/return demonstration of topics presented.  Learning Barriers/Preferences:  Learning Barriers/Preferences - 06/28/23 1027       Learning Barriers/Preferences   Learning Barriers None    Learning Preferences None             General Pulmonary Education Topics:  Infection Prevention: - Provides verbal and written material to individual with discussion of infection control including proper hand washing and proper equipment cleaning during exercise session. Flowsheet Row  Pulmonary Rehab from 06/30/2023 in [facility] Cardiac and Pulmonary Rehab  Date 06/30/23  Educator MB  Instruction Review Code 1- Verbalizes Understanding       Falls Prevention: - Provides verbal and written material to individual with discussion of falls prevention and safety. Flowsheet Row Pulmonary Rehab from 06/30/2023 in [facility] Cardiac and Pulmonary Rehab  Date 06/30/23  Educator MB   Instruction Review Code 1- Verbalizes Understanding       Chronic Lung Disease Review: - Group verbal instruction with posters, models, PowerPoint presentations and videos,  to review new updates, new respiratory medications, new advancements in procedures and treatments. Providing information on websites and "800" numbers for continued self-education. Includes information about supplement oxygen, available portable oxygen systems, continuous and intermittent flow rates, oxygen safety, concentrators, and Medicare reimbursement for oxygen. Explanation of Pulmonary Drugs, including class, frequency, complications, importance of spacers, rinsing mouth after steroid MDI's, and proper cleaning methods for nebulizers. Review of basic lung anatomy and physiology related to function, structure, and complications of lung disease. Review of risk factors. Discussion about methods for diagnosing sleep apnea and types of masks and machines for OSA. Includes a review of the use of types of environmental controls: home humidity, furnaces, filters, dust mite/pet prevention, HEPA vacuums. Discussion about weather changes, air quality and the benefits of nasal washing. Instruction on Warning signs, infection symptoms, calling MD promptly, preventive modes, and value of vaccinations. Review of effective airway clearance, coughing and/or vibration techniques. Emphasizing that all should Create an Action Plan. Written material given at graduation.   AED/CPR: - Group verbal and written instruction with the use of models to demonstrate the basic use of the AED with the basic ABC's of resuscitation.    Anatomy and Cardiac Procedures: - Group verbal and visual presentation and models provide information about basic cardiac anatomy and function. Reviews the testing methods done to diagnose heart disease and the outcomes of the test results. Describes the treatment choices: Medical Management, Angioplasty, or Coronary Bypass  Surgery for treating various heart conditions including Myocardial Infarction, Angina, Valve Disease, and Cardiac Arrhythmias.  Written material given at graduation.   Medication Safety: - Group verbal and visual instruction to review commonly prescribed medications for heart and lung disease. Reviews the medication, class of the drug, and side effects. Includes the steps to properly store meds and maintain the prescription regimen.  Written material given at graduation.   Other: -Provides group and verbal instruction on various topics (see comments)   Knowledge Questionnaire Score:  Knowledge Questionnaire Score - 07/07/23 1349       Knowledge Questionnaire Score   Pre Score 14/18              Core Components/Risk Factors/Patient Goals at Admission:  Personal Goals and Risk Factors at Admission - 06/30/23 1622       Core Components/Risk Factors/Patient Goals on Admission    Weight Management Yes;Weight Loss    Intervention Weight Management: Develop a combined nutrition and exercise program designed to reach desired caloric intake, while maintaining appropriate intake of nutrient and fiber, sodium and fats, and appropriate energy expenditure required for the weight goal.;Weight Management: Provide education and appropriate resources to help participant work on and attain dietary goals.;Weight Management/Obesity: Establish reasonable short term and long term weight goals.;Obesity: Provide education and appropriate resources to help participant work on and attain dietary goals.    Admit Weight 165 lb 12.8 oz (75.2 kg)    Goal Weight: Short Term 158 lb 12.8 oz (  72 kg)    Goal Weight: Long Term 151 lb 12.8 oz (68.9 kg)    Expected Outcomes Short Term: Continue to assess and modify interventions until short term weight is achieved;Long Term: Adherence to nutrition and physical activity/exercise program aimed toward attainment of established weight goal;Weight Loss: Understanding of  general recommendations for a balanced deficit meal plan, which promotes 1-2 lb weight loss per week and includes a negative energy balance of [phone] kcal/d;Understanding recommendations for meals to include 15-35% energy as protein, 25-35% energy from fat, 35-60% energy from carbohydrates, less than 200mg  of dietary cholesterol, 20-35 gm of total fiber daily;Understanding of distribution of calorie intake throughout the day with the consumption of 4-5 meals/snacks    Improve shortness of breath with ADL's Yes    Intervention Provide education, individualized exercise plan and daily activity instruction to help decrease symptoms of SOB with activities of daily living.    Expected Outcomes Short Term: Improve cardiorespiratory fitness to achieve a reduction of symptoms when performing ADLs;Long Term: Be able to perform more ADLs without symptoms or delay the onset of symptoms    Hypertension Yes    Intervention Provide education on lifestyle modifcations including regular physical activity/exercise, weight management, moderate sodium restriction and increased consumption of fresh fruit, vegetables, and low fat dairy, alcohol moderation, and smoking cessation.;Monitor prescription use compliance.    Expected Outcomes Short Term: Continued assessment and intervention until BP is < 140/46mm HG in hypertensive participants. < 130/57mm HG in hypertensive participants with diabetes, heart failure or chronic kidney disease.;Long Term: Maintenance of blood pressure at goal levels.    Lipids Yes    Intervention Provide education and support for participant on nutrition & aerobic/resistive exercise along with prescribed medications to achieve LDL 70mg , HDL >40mg .    Expected Outcomes Short Term: Participant states understanding of desired cholesterol values and is compliant with medications prescribed. Participant is following exercise prescription and nutrition guidelines.;Long Term: Cholesterol controlled with  medications as prescribed, with individualized exercise RX and with personalized nutrition plan. Value goals: LDL < 70mg , HDL > 40 mg.             Education:Diabetes - Individual verbal and written instruction to review signs/symptoms of diabetes, desired ranges of glucose level fasting, after meals and with exercise. Acknowledge that pre and post exercise glucose checks will be done for 3 sessions at entry of program.   Know Your Numbers and Heart Failure: - Group verbal and visual instruction to discuss disease risk factors for cardiac and pulmonary disease and treatment options.  Reviews associated critical values for Overweight/Obesity, Hypertension, Cholesterol, and Diabetes.  Discusses basics of heart failure: signs/symptoms and treatments.  Introduces Heart Failure Zone chart for action plan for heart failure.  Written material given at graduation.   Core Components/Risk Factors/Patient Goals Review:   Goals and Risk Factor Review     Row Name 08/09/23 1130 09/01/23 1116           Core Components/Risk Factors/Patient Goals Review   Personal Goals Review Weight Management/Obesity Improve shortness of breath with ADL's      Review Deanna [name] gained weight when being ill, she is now looking to lose some of that gained weight. She is more active than before but still unable to lose the weight. Encouraged her to increase workload as able and eat low caloire nutrient dense foods often. The weight loss might take some time as her physcial exercise is limited due to shortness of breath. Spoke to patient about  their shortness of breath and what they can do to improve. Patient has been informed of breathing techniques when starting the program. Patient is informed to tell staff if they have had any med changes and that certain meds they are taking or not taking can be causing shortness of breath.      Expected Outcomes STG: lose 5% of body weight. LTG: acheve and maintain healthy weight Short:  Attend [facility] regularly to improve shortness of breath with ADL's. Long: maintain independence with ADL's               Core Components/Risk Factors/Patient Goals at Discharge (Final Review):   Goals and Risk Factor Review - 09/01/23 1116       Core Components/Risk Factors/Patient Goals Review   Personal Goals Review Improve shortness of breath with ADL's    Review Spoke to patient about their shortness of breath and what they can do to improve. Patient has been informed of breathing techniques when starting the program. Patient is informed to tell staff if they have had any med changes and that certain meds they are taking or not taking can be causing shortness of breath.    Expected Outcomes Short: Attend [facility] regularly to improve shortness of breath with ADL's. Long: maintain independence with ADL's             ITP Comments:  ITP Comments     Row Name 06/28/23 1025 06/30/23 1614 07/07/23 1138 07/14/23 0812 08/11/23 0819   ITP Comments Virtual Visit completed. Patient informed on EP and RD appointment and 6 Minute walk test. Patient also informed of patient health questionnaires on My Chart. Patient Verbalizes understanding. Visit diagnosis can be found in [facility] 06/09/2023. Completed and gym orientation. Initial ITP created and sent for review to Dr. Jinny Sanders, Medical Director. First full day of exercise!  Patient was oriented to gym and equipment including functions, settings, policies, and procedures.  Patient's individual exercise prescription and treatment plan were reviewed.  All starting workloads were established based on the results of the 6 minute walk test done at initial orientation visit.  The plan for exercise progression was also introduced and progression will be customized based on patient's performance and goals. 30 Day review completed. Medical Director ITP review done, changes made as directed, and signed approval by Medical Director. Newer to Program 30  Day review completed. Medical Director ITP review done, changes made as directed, and signed approval by Medical Director.    Row Name 09/08/23 0941           ITP Comments 30 Day review completed. Medical Director ITP review done, changes made as directed, and signed approval by Medical Director.                Comments:

## 2023-09-08 NOTE — Progress Notes (Signed)
 Daily Session Note  Patient Details  Name: Madoline Bhatt Tolan MRN: [id number] Date of Birth: 1946-01-23 Referring Provider:   Flowsheet Row Pulmonary Rehab from 06/30/2023 in [facility] Cardiac and Pulmonary Rehab  Referring Provider Janann Colonel, MD       Encounter Date: 09/08/2023  Check In:  Session Check In - 09/08/23 1110       Check-In   Supervising physician immediately available to respond to emergencies See telemetry face sheet for immediately available ER MD    Location [facility]-Cardiac & Pulmonary Rehab    Staff Present Susann Givens RN,BSN;Margaret Best, MS, Exercise Physiologist;Aaliayah Miao RN,BSN,MPA;Joseph Hood RCP,RRT,BSRT    Virtual Visit No    Medication changes reported     No    Fall or balance concerns reported    No    Warm-up and Cool-down Performed on first and last piece of equipment    Resistance Training Performed Yes    VAD Patient? No    PAD/SET Patient? No      Pain Assessment   Currently in Pain? No/denies                Social History   Tobacco Use  Smoking Status Former   Current packs/day: 0.00   Average packs/day: 0.5 packs/day for 30.0 years (15.0 ttl pk-yrs)   Types: Cigarettes   Start date: 01/01/1968   Quit date: 12/31/1997   Years since quitting: 25.7  Smokeless Tobacco Never  Tobacco Comments   Quit in 1999    Goals Met:  Independence with exercise equipment Exercise tolerated well No report of concerns or symptoms today Strength training completed today  Goals Unmet:  Not Applicable  Comments: Pt able to follow exercise prescription today without complaint.  Will continue to monitor for progression.    Dr. Bethann Punches is Medical Director for [facility] Cardiac Rehabilitation.  Dr. Vida Rigger is Medical Director for [facility] Pulmonary Rehabilitation.

## 2023-09-13 ENCOUNTER — Encounter: Payer: MEDICARE | Admitting: *Deleted

## 2023-09-13 DIAGNOSIS — R0602 Shortness of breath: Secondary | ICD-10-CM | POA: Diagnosis not present

## 2023-09-13 NOTE — Progress Notes (Signed)
 Daily Session Note  Patient Details  Name: Deanna [name] MRN: [id number] Date of Birth: [birth year] Referring Provider:   Flowsheet Row Pulmonary Rehab from 06/30/2023 in [facility] Cardiac and Pulmonary Rehab  Referring Provider Annitta Kindler, MD       Encounter Date: 09/13/2023  Check In:  Session Check In - 09/13/23 1123       Check-In   Supervising physician immediately available to respond to emergencies See telemetry face sheet for immediately available ER MD    Location [facility]-Cardiac & Pulmonary Rehab    Staff Present Maud Sorenson, RN, BSN, CCRP;Maxon Conetta BS, Exercise Physiologist;Kelly [employer], ACSM CEP, Exercise Physiologist;Noah Tickle, BS, Exercise Physiologist;Jason Martina Sledge RDN,LDN    Virtual Visit No    Medication changes reported     No    Fall or balance concerns reported    No    Warm-up and Cool-down Performed on first and last piece of equipment    Resistance Training Performed Yes    VAD Patient? No    PAD/SET Patient? No      Pain Assessment   Currently in Pain? No/denies                Social History   Tobacco Use  Smoking Status Former   Current packs/day: 0.00   Average packs/day: 0.5 packs/day for 30.0 years (15.0 ttl pk-yrs)   Types: Cigarettes   Start date: 01/01/1968   Quit date: 12/31/1997   Years since quitting: 25.7  Smokeless Tobacco Never  Tobacco Comments   Quit in 1999    Goals Met:  Proper associated with RPD/PD & O2 Sat Independence with exercise equipment Exercise tolerated well No report of concerns or symptoms today  Goals Unmet:  Not Applicable  Comments: Pt able to follow exercise prescription today without complaint.  Will continue to monitor for progression.    Dr. Firman Hughes is Medical Director for [facility] Cardiac Rehabilitation.  Dr. Fuad Aleskerov is Medical Director for [facility] Pulmonary Rehabilitation.

## 2023-09-17 ENCOUNTER — Encounter: Payer: MEDICARE | Admitting: *Deleted

## 2023-09-17 DIAGNOSIS — R0602 Shortness of breath: Secondary | ICD-10-CM | POA: Diagnosis not present

## 2023-09-17 NOTE — Progress Notes (Signed)
 Daily Session Note  Patient Details  Name: Deanna [name] MRN: [id number] Date of Birth: Apr 19, 1946 Referring Provider:   Flowsheet Row Pulmonary Rehab from 06/30/2023 in [facility] Cardiac and Pulmonary Rehab  Referring Provider Annitta Kindler, MD       Encounter Date: 09/17/2023  Check In:  Session Check In - 09/17/23 1126       Check-In   Supervising physician immediately available to respond to emergencies See telemetry face sheet for immediately available ER MD    Location [facility]-Cardiac & Pulmonary Rehab    Staff Present Maxon Conetta BS, Exercise Physiologist;Dee Paden RN,BSN;Noah Tickle, BS, Exercise Physiologist;Kelly Sabra Cramp BS, ACSM CEP, Exercise Physiologist    Virtual Visit No    Medication changes reported     Yes    Comments on prednisone  taper    Fall or balance concerns reported    No    Tobacco Cessation No Change    Warm-up and Cool-down Performed on first and last piece of equipment    Resistance Training Performed Yes    VAD Patient? No    PAD/SET Patient? No      Pain Assessment   Currently in Pain? No/denies                Social History   Tobacco Use  Smoking Status Former   Current packs/day: 0.00   Average packs/day: 0.5 packs/day for 30.0 years (15.0 ttl pk-yrs)   Types: Cigarettes   Start date: 01/01/1968   Quit date: 12/31/1997   Years since quitting: 25.7  Smokeless Tobacco Never  Tobacco Comments   Quit in 1999    Goals Met:  Proper associated with RPD/PD & O2 Sat Independence with exercise equipment Exercise tolerated well No report of concerns or symptoms today Strength training completed today  Goals Unmet:  Not Applicable  Comments: Pt able to follow exercise prescription today without complaint.  Will continue to monitor for progression.    Dr. Firman Hughes is Medical Director for [facility] Cardiac Rehabilitation.  Dr. Fuad Aleskerov is Medical Director for [facility] Pulmonary Rehabilitation.

## 2023-09-20 ENCOUNTER — Encounter: Payer: MEDICARE | Admitting: *Deleted

## 2023-09-20 ENCOUNTER — Telehealth: Payer: Self-pay

## 2023-09-20 ENCOUNTER — Other Ambulatory Visit: Payer: Self-pay

## 2023-09-20 DIAGNOSIS — R0602 Shortness of breath: Secondary | ICD-10-CM

## 2023-09-20 MED ORDER — APIXABAN 5 MG PO TABS: 5.0000 mg | ORAL_TABLET | Freq: Two times a day (BID) | ORAL | 1 refills | Status: DC

## 2023-09-20 NOTE — Telephone Encounter (Signed)
*  STAT* If patient is at the pharmacy, call can be transferred to refill team.   1. Which medications need to be refilled? (please list name of each medication and dose if known) apixaban  (ELIQUIS ) 5 MG TABS tablet   2. Which pharmacy/location (including street and city if local pharmacy) is medication to be sent to? [id number] - [place], [place] - [address] AT [address]OSS [address]   3. Do they need a 30 day or 90 day supply? 90

## 2023-09-20 NOTE — Progress Notes (Signed)
 Daily Session Note  Patient Details  Name: Deanna [name] MRN: [id number] Date of Birth: Dec 13, 1945 Referring Provider:   Flowsheet Row Pulmonary Rehab from 06/30/2023 in [facility] Cardiac and Pulmonary Rehab  Referring Provider Annitta Kindler, MD       Encounter Date: 09/20/2023  Check In:  Session Check In - 09/20/23 1122       Check-In   Supervising physician immediately available to respond to emergencies See telemetry face sheet for immediately available ER MD    Location [facility]-Cardiac & Pulmonary Rehab    Staff Present Maxon Conetta BS, Exercise Physiologist;Kelly Dawne Euler, ACSM CEP, Exercise Physiologist;Cammie Faulstich, RN, BSN, CCRP;Joseph Hood RCP,RRT,BSRT    Virtual Visit No    Medication changes reported     No    Fall or balance concerns reported    No    Warm-up and Cool-down Performed on first and last piece of equipment    Resistance Training Performed Yes    VAD Patient? No    PAD/SET Patient? No      Pain Assessment   Currently in Pain? No/denies                Social History   Tobacco Use  Smoking Status Former   Current packs/day: 0.00   Average packs/day: 0.5 packs/day for 30.0 years (15.0 ttl pk-yrs)   Types: Cigarettes   Start date: 01/01/1968   Quit date: 12/31/1997   Years since quitting: 25.7  Smokeless Tobacco Never  Tobacco Comments   Quit in 1999    Goals Met:  Proper associated with RPD/PD & O2 Sat Independence with exercise equipment Exercise tolerated well No report of concerns or symptoms today  Goals Unmet:  Not Applicable  Comments: Pt able to follow exercise prescription today without complaint.  Will continue to monitor for progression.    Dr. Firman Hughes is Medical Director for [facility] Cardiac Rehabilitation.  Dr. Fuad Aleskerov is Medical Director for [facility] Pulmonary Rehabilitation.

## 2023-09-20 NOTE — Telephone Encounter (Signed)
 Prescription refill request for Eliquis  received. Indication:afib Last office visit:3/25 Scr:0.76  1/25 Age: 78 Weight:76.6  kg  Prescription refilled

## 2023-09-21 ENCOUNTER — Other Ambulatory Visit: Payer: Self-pay

## 2023-09-21 ENCOUNTER — Encounter (HOSPITAL_COMMUNITY): Payer: Self-pay

## 2023-09-21 MED ORDER — DILTIAZEM HCL ER 120 MG PO CP12: 120.0000 mg | ORAL_CAPSULE | Freq: Two times a day (BID) | ORAL | 2 refills | Status: DC

## 2023-09-21 NOTE — Telephone Encounter (Signed)
 Prescription sent to pharmacy.

## 2023-09-22 ENCOUNTER — Encounter: Payer: MEDICARE | Admitting: *Deleted

## 2023-09-22 DIAGNOSIS — R0602 Shortness of breath: Secondary | ICD-10-CM

## 2023-09-22 NOTE — Progress Notes (Signed)
 Daily Session Note  Patient Details  Name: Deanna [name] MRN: [id number] Date of Birth: 1946/04/17 Referring Provider:   Flowsheet Row Pulmonary Rehab from 06/30/2023 in [facility] Cardiac and Pulmonary Rehab  Referring Provider Annitta Kindler, MD       Encounter Date: 09/22/2023  Check In:  Session Check In - 09/22/23 1137       Check-In   Supervising physician immediately available to respond to emergencies See telemetry face sheet for immediately available ER MD    Location [facility]-Cardiac & Pulmonary Rehab    Staff Present Maxon Conetta BS, Exercise Physiologist;Margaret Best, MS, Exercise Physiologist;Joseph Hood RCP,RRT,BSRT;Meredith Manson Seitz RN,BSN;Justine Cossin, RN, BSN, CCRP    Virtual Visit No    Medication changes reported     No    Fall or balance concerns reported    No    Warm-up and Cool-down Performed on first and last piece of equipment    Resistance Training Performed Yes    VAD Patient? No    PAD/SET Patient? No      Pain Assessment   Currently in Pain? No/denies                Social History   Tobacco Use  Smoking Status Former   Current packs/day: 0.00   Average packs/day: 0.5 packs/day for 30.0 years (15.0 ttl pk-yrs)   Types: Cigarettes   Start date: 01/01/1968   Quit date: 12/31/1997   Years since quitting: 25.7  Smokeless Tobacco Never  Tobacco Comments   Quit in 1999    Goals Met:  Proper associated with RPD/PD & O2 Sat Independence with exercise equipment Exercise tolerated well No report of concerns or symptoms today  Goals Unmet:  Not Applicable  Comments: Pt able to follow exercise prescription today without complaint.  Will continue to monitor for progression.    Dr. Firman Hughes is Medical Director for [facility] Cardiac Rehabilitation.  Dr. Fuad Aleskerov is Medical Director for [facility] Pulmonary Rehabilitation.

## 2023-09-23 ENCOUNTER — Other Ambulatory Visit: Payer: Self-pay

## 2023-09-23 ENCOUNTER — Ambulatory Visit (HOSPITAL_COMMUNITY)
Admission: RE | Admit: 2023-09-23 | Discharge: 2023-09-23 | Disposition: A | Discharge status: Home / Self Care | Payer: MEDICARE | Source: Ambulatory Visit

## 2023-09-23 ENCOUNTER — Encounter (HOSPITAL_COMMUNITY): Payer: Self-pay

## 2023-09-23 DIAGNOSIS — I421 Obstructive hypertrophic cardiomyopathy: Secondary | ICD-10-CM

## 2023-09-27 ENCOUNTER — Encounter: Payer: MEDICARE | Admitting: *Deleted

## 2023-09-27 DIAGNOSIS — R0602 Shortness of breath: Secondary | ICD-10-CM | POA: Diagnosis not present

## 2023-09-27 NOTE — Progress Notes (Signed)
 Daily Session Note  Patient Details  Name: Daneisha Wambach Gearin MRN: [id number] Date of Birth: 11/18/1945 Referring Provider:   Flowsheet Row Pulmonary Rehab from 06/30/2023 in [facility] Cardiac and Pulmonary Rehab  Referring Provider Annitta Kindler, MD       Encounter Date: 09/27/2023  Check In:  Session Check In - 09/27/23 1138       Check-In   Supervising physician immediately available to respond to emergencies See telemetry face sheet for immediately available ER MD    Location [facility]-Cardiac & Pulmonary Rehab    Staff Present Maud Sorenson, RN, BSN, CCRP;Meredith Manson Seitz RN,BSN;Joseph Hood RCP,RRT,BSRT;Maxon Frankfort BS, Exercise Physiologist;Kelly Sabra Cramp BS, ACSM CEP, Exercise Physiologist    Virtual Visit No    Medication changes reported     No    Fall or balance concerns reported    No    Warm-up and Cool-down Performed on first and last piece of equipment    Resistance Training Performed Yes    VAD Patient? No    PAD/SET Patient? No      Pain Assessment   Currently in Pain? No/denies                Social History   Tobacco Use  Smoking Status Former   Current packs/day: 0.00   Average packs/day: 0.5 packs/day for 30.0 years (15.0 ttl pk-yrs)   Types: Cigarettes   Start date: 01/01/1968   Quit date: 12/31/1997   Years since quitting: 25.7  Smokeless Tobacco Never  Tobacco Comments   Quit in 1999    Goals Met:  Proper associated with RPD/PD & O2 Sat Independence with exercise equipment Exercise tolerated well No report of concerns or symptoms today  Goals Unmet:  Not Applicable  Comments: Pt able to follow exercise prescription today without complaint.  Will continue to monitor for progression.    Dr. Firman Hughes is Medical Director for [facility] Cardiac Rehabilitation.  Dr. Fuad Aleskerov is Medical Director for [facility] Pulmonary Rehabilitation.

## 2023-09-29 ENCOUNTER — Ambulatory Visit (HOSPITAL_COMMUNITY)
Admission: RE | Admit: 2023-09-29 | Discharge: 2023-09-29 | Disposition: A | Discharge status: Home / Self Care | Payer: MEDICARE | Source: Ambulatory Visit

## 2023-09-29 DIAGNOSIS — I421 Obstructive hypertrophic cardiomyopathy: Secondary | ICD-10-CM | POA: Insufficient documentation

## 2023-09-29 MED ORDER — GADOBUTROL 1 MMOL/ML IV SOLN
10.0000 mL | Freq: Once | INTRAVENOUS | Status: AC | PRN
  Administered 2023-09-29: 10 mL via INTRAVENOUS

## 2023-09-30 ENCOUNTER — Encounter: Payer: MEDICARE | Attending: Pulmonary Disease | Admitting: *Deleted

## 2023-09-30 DIAGNOSIS — R0602 Shortness of breath: Secondary | ICD-10-CM | POA: Diagnosis present

## 2023-09-30 NOTE — Progress Notes (Signed)
 Daily Session Note  Patient Details  Name: Aniaya Dural Mcanany MRN: [id number] Date of Birth: 05-30-1946 Referring Provider:   Flowsheet Row Pulmonary Rehab from 06/30/2023 in [facility] Cardiac and Pulmonary Rehab  Referring Provider Annitta Kindler, MD       Encounter Date: 09/30/2023  Check In:  Session Check In - 09/30/23 1129       Check-In   Supervising physician immediately available to respond to emergencies See telemetry face sheet for immediately available ER MD    Location [facility]-Cardiac & Pulmonary Rehab    Staff Present Maud Sorenson, RN, BSN, CCRP;Meredith Manson Seitz RN,BSN;Joseph Hood RCP,RRT,BSRT;Maxon Folsom BS, Exercise Physiologist    Virtual Visit No    Medication changes reported     No    Fall or balance concerns reported    No    Warm-up and Cool-down Performed on first and last piece of equipment    Resistance Training Performed Yes    VAD Patient? No    PAD/SET Patient? No      Pain Assessment   Currently in Pain? No/denies                Social History   Tobacco Use  Smoking Status Former   Current packs/day: 0.00   Average packs/day: 0.5 packs/day for 30.0 years (15.0 ttl pk-yrs)   Types: Cigarettes   Start date: 01/01/1968   Quit date: 12/31/1997   Years since quitting: 25.7  Smokeless Tobacco Never  Tobacco Comments   Quit in 1999    Goals Met:  Proper associated with RPD/PD & O2 Sat Independence with exercise equipment Exercise tolerated well No report of concerns or symptoms today  Goals Unmet:  Not Applicable  Comments: Pt able to follow exercise prescription today without complaint.  Will continue to monitor for progression.    Dr. Firman Hughes is Medical Director for [facility] Cardiac Rehabilitation.  Dr. Fuad Aleskerov is Medical Director for [facility] Pulmonary Rehabilitation.

## 2023-10-01 ENCOUNTER — Ambulatory Visit (HOSPITAL_COMMUNITY): Admission: RE | Admit: 2023-10-01 | Payer: MEDICARE | Source: Ambulatory Visit

## 2023-10-04 ENCOUNTER — Telehealth: Payer: Self-pay

## 2023-10-04 NOTE — Telephone Encounter (Signed)
 Patient is calling for her results to her MRI.

## 2023-10-05 ENCOUNTER — Encounter: Payer: MEDICARE | Admitting: *Deleted

## 2023-10-05 DIAGNOSIS — R0602 Shortness of breath: Secondary | ICD-10-CM

## 2023-10-05 NOTE — Progress Notes (Signed)
 Daily Session Note  Patient Details  Name: Deanna [name] MRN: [id number] Date of Birth: 09/11/1945 Referring Provider:   Flowsheet Row Pulmonary Rehab from 06/30/2023 in [facility] Cardiac and Pulmonary Rehab  Referring Provider Annitta Kindler, MD       Encounter Date: 10/05/2023  Check In:  Session Check In - 10/05/23 1129       Check-In   Supervising physician immediately available to respond to emergencies See telemetry face sheet for immediately available ER MD    Location [facility]-Cardiac & Pulmonary Rehab    Staff Present Maud Sorenson, RN, BSN, CCRP;Margaret Best, MS, Exercise Physiologist;Maxon Conetta BS, Exercise Physiologist;Meredith Manson Seitz RN,BSN;Jason Martina Sledge RDN,LDN    Virtual Visit No    Medication changes reported     No    Fall or balance concerns reported    No    Warm-up and Cool-down Performed on first and last piece of equipment    Resistance Training Performed Yes    VAD Patient? No    PAD/SET Patient? No      Pain Assessment   Currently in Pain? No/denies                Social History   Tobacco Use  Smoking Status Former   Current packs/day: 0.00   Average packs/day: 0.5 packs/day for 30.0 years (15.0 ttl pk-yrs)   Types: Cigarettes   Start date: 01/01/1968   Quit date: 12/31/1997   Years since quitting: 25.7  Smokeless Tobacco Never  Tobacco Comments   Quit in 1999    Goals Met:  Proper associated with RPD/PD & O2 Sat Independence with exercise equipment Exercise tolerated well No report of concerns or symptoms today  Goals Unmet:  Not Applicable  Comments: Pt able to follow exercise prescription today without complaint.  Will continue to monitor for progression.    Dr. Firman Hughes is Medical Director for [facility] Cardiac Rehabilitation.  Dr. Fuad Aleskerov is Medical Director for [facility] Pulmonary Rehabilitation.

## 2023-10-06 ENCOUNTER — Encounter: Payer: MEDICARE | Admitting: *Deleted

## 2023-10-06 DIAGNOSIS — R0602 Shortness of breath: Secondary | ICD-10-CM

## 2023-10-06 NOTE — Progress Notes (Signed)
 Pulmonary Individual Treatment Plan  Patient Details  Name: Makaelah Magnant Groh MRN: [id number] Date of Birth: 1946/03/17 Referring Provider:   Flowsheet Row Pulmonary Rehab from 06/30/2023 in [facility] Cardiac and Pulmonary Rehab  Referring Provider Assaker, Marianne Shirts, MD       Initial Encounter Date:  Flowsheet Row Pulmonary Rehab from 06/30/2023 in [facility] Cardiac and Pulmonary Rehab  Date 06/30/23       Visit Diagnosis: SOB (shortness of breath)  Patient's Home Medications on Admission:  Current Outpatient Medications:    albuterol  (VENTOLIN  HFA) 108 (90 Base) MCG/ACT inhaler, Inhale 2 puffs into the lungs every 6 (six) hours as needed for wheezing or shortness of breath., Disp: 8 g, Rfl: 2   apixaban  (ELIQUIS ) 5 MG TABS tablet, Take 1 tablet (5 mg total) by mouth 2 (two) times daily. Hold medication from 9/15 to 9/21 for right hip surgery, Disp: 180 tablet, Rfl: 1   Ascorbic Acid  (VITAMIN C ) 1000 MG tablet, Take 4,000 mg by mouth daily., Disp: , Rfl:    azithromycin  (ZITHROMAX ) 250 MG tablet, Take 1 tablet (250 mg total) by mouth daily. Take first 2 tablets together, then 1 every day until finished., Disp: 6 tablet, Rfl: 0   budesonide -formoterol  (SYMBICORT ) 160-4.5 MCG/ACT inhaler, Inhale 2 puffs into the lungs in the morning and at bedtime., Disp: 1 each, Rfl: 12   Calcium -Magnesium  (CAL-MAG PO), Take 2,000 mg by mouth at bedtime. 1000 mg, Disp: , Rfl:    Carboxymethylcellul-Glycerin  (CLEAR EYES FOR DRY EYES OP), Place 1 drop into both eyes daily., Disp: , Rfl:    [employer] MAGNESIUM  OXIDE 250 MG TABS, TAKE 1 TABLET (250 MG TOTAL) BY MOUTH EVERY EVENING. TAKE WITH EVENING MEAL, Disp: 100 tablet, Rfl: 1   diltiazem  (CARDIZEM  SR) 120 MG 12 hr capsule, Take 1 capsule (120 mg total) by mouth every 12 (twelve) hours., Disp: 90 capsule, Rfl: 2   furosemide  (LASIX ) 20 MG tablet, Take 20 mg by mouth as needed., Disp: , Rfl:    hydrochlorothiazide  (HYDRODIURIL ) 25 MG tablet, Take 1 tablet (25 mg total)  by mouth 2 (two) times daily., Disp: 180 tablet, Rfl: 3   HYDROcodone -acetaminophen  (NORCO/VICODIN) 5-325 MG tablet, Take 1-2 tablets by mouth every 4 (four) hours as needed for severe pain., Disp: 42 tablet, Rfl: 0   loratadine  (CLARITIN ) 10 MG tablet, Take 10 mg by mouth daily as needed for allergies., Disp: , Rfl:    losartan  (COZAAR ) 50 MG tablet, TAKE 1 TABLET BY MOUTH EVERY DAY, Disp: 90 tablet, Rfl: 1   methocarbamol  (ROBAXIN ) 500 MG tablet, Take 1 tablet (500 mg total) by mouth every 6 (six) hours as needed for muscle spasms (muscle pain)., Disp: 40 tablet, Rfl: 2   metoprolol  tartrate (LOPRESSOR ) 25 MG tablet, Take 1 tablet (25 mg total) by mouth 2 (two) times daily., Disp: 60 tablet, Rfl: 6   milk thistle 175 MG tablet, Take 175 mg by mouth daily., Disp: , Rfl:    nitroGLYCERIN  (NITROSTAT ) 0.4 MG SL tablet, Place 1 tablet (0.4 mg total) under the tongue every 5 (five) minutes x 3 doses as needed for chest pain., Disp: 25 tablet, Rfl: 1   polyethylene glycol (MIRALAX  / GLYCOLAX ) 17 g packet, Take 17 g by mouth 2 (two) times daily., Disp: 14 each, Rfl: 0   Probiotic Product (PROBIOTIC DAILY PO), Take 1 tablet by mouth daily., Disp: , Rfl:    rosuvastatin  (CRESTOR ) 20 MG tablet, Take 1 tablet (20 mg total) by mouth daily., Disp: 30 tablet, Rfl:  6   umeclidinium bromide  (INCRUSE ELLIPTA ) 62.5 MCG/ACT AEPB, Inhale 1 puff into the lungs daily., Disp: 1 each, Rfl: 6   Vitamin A  2400 MCG (8000 UT) CAPS, Take 16,000 Units by mouth daily. 8000 units, Disp: , Rfl:    Vitamin D, Ergocalciferol, (DRISDOL) 1.25 MG (50000 UNIT) CAPS capsule, Take 50,000 Units by mouth once a week., Disp: , Rfl:   Past Medical History: Past Medical History:  Diagnosis Date   A-fib (HCC)    Abnormal glucose 03/06/2020   Acute coronary syndrome (HCC) 02/09/2023   Age-related osteoporosis without current pathological fracture 11/22/2015   Overview:  Femoral neck  Formatting of this note might be different from the  original. 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 Added automatically from request for surgery [id number]   Chronic pain syndrome 04/01/2015   Colon polyps    Dyspnea    Essential hypertension 07/20/2000   Overview:  HBP   Family history of adverse reaction to anesthesia    mother delerium   Female cystocele 02/27/2009   GAD (generalized anxiety disorder) 08/19/2015   GERD (gastroesophageal reflux disease)    Heart murmur    History of Bell's palsy 03/21/2015   Hx of adenomatous colonic polyps 12/20/2008   Overview:  Colonoscopy 07/2003 - LMD - polyps removed - recommended repeat in 3 years Colonoscopy 09/2011 - Dr. Aleta Anda - hyperplastic polyp(s) were removed - repeat in 5 years   Hypertension    Incomplete uterovaginal prolapse 03/04/2004   Insomnia    Left bundle-branch block 05/20/2006   Lumbar radiculopathy 01/09/2016   Formatting of this note might be different from the original. 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 Femoral neck   Other intervertebral disc degeneration, lumbar region 01/29/2015   Pre-operative cardiovascular examination 09/03/2020   Primary osteoarthritis, right hand 02/28/2002   Overview:  OA HANDS, ESP THUMBS   Radial tunnel syndrome, right  11/04/2016   Radiculopathy, lumbar region 11/22/2014   Overview:  Added automatically from request for surgery [id number]   Rhinitis, allergic 02/24/2002   Overview:  ALLERGY SYMPTOMS - states was seen by allergist in past and informed allergic to dust   S/P left total hip arthroplasty 09/17/2020   S/P total right hip arthroplasty 02/18/2023   Sacroiliitis (HCC) 08/02/2019   Screening for colon cancer 04/17/2005   Formatting of this note might be different from the original. 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 TRIGGER  THUMBS - started spring 2003. Injected 02/28/02   Unspecified asthma, uncomplicated 12/25/2004    Tobacco Use: Social History   Tobacco Use  Smoking Status Former   Current packs/day: 0.00   Average packs/day: 0.5 packs/day for 30.0 years (15.0 ttl pk-yrs)   Types: Cigarettes   Start date: 01/01/1968   Quit date: 12/31/1997   Years since quitting: 25.7  Smokeless Tobacco Never  Tobacco Comments   Quit in 1999    Labs: Review Flowsheet  More data exists      Latest Ref Rng & Units 01/23/2021 06/26/2021 11/04/2021 02/10/2023 02/11/2023  Labs for ITP Cardiac and Pulmonary Rehab  Cholestrol 0 - 200 mg/dL - - 829  562  -  LDL (calc) 0 - 99 mg/dL - - 89  96  -  HDL-C >13 mg/dL - - 54  42  -  Trlycerides <150 mg/dL - - 47  086  -  Hemoglobin A1c 4.8 - 5.6 % 6.1  6.0  5.8  - 6.0      Pulmonary Assessment Scores:  Pulmonary Assessment Scores     Row Name 07/07/23 1348         ADL UCSD   ADL Phase Entry     SOB Score total 76     Rest 1     Walk 4     Stairs 5     Bath 2     Dress 3     Shop 3       CAT Score    CAT Score 21              UCSD: Self-administered rating of dyspnea associated with activities of daily living (ADLs) 6-point scale (0 = "not at all" to 5 = "maximal or unable to do because of breathlessness")  Scoring Scores range from 0 to 120.  Minimally important difference is 5 units  CAT: CAT can identify the health impairment of COPD patients and is better correlated with disease progression.  CAT has a scoring range of zero to 40. The CAT score is classified into four groups of low (less than 10), medium (10 - 20), high (21-30) and very high (31-40) based on the impact level of disease on health status. A CAT score over 10 suggests significant symptoms.  A worsening CAT score could be explained by an exacerbation, poor medication adherence, poor inhaler technique, or progression of COPD or comorbid conditions.  CAT MCID is 2 points  mMRC: mMRC (Modified Medical Research Council) Dyspnea Scale is used to assess the degree of baseline functional disability in patients of respiratory disease due to dyspnea. No minimal important difference is established. A decrease in score of 1 point or greater is considered a positive change.   Pulmonary Function Assessment:  Pulmonary Function Assessment - 06/28/23 1027       Breath   Shortness of Breath Yes;Limiting activity;Fear of Shortness of Breath             Exercise Target Goals: Exercise Program Goal: Individual exercise prescription set using results from initial 6 min walk test and THRR while considering  patient's activity barriers and safety.   Exercise Prescription Goal: Initial exercise prescription builds to 30-45 minutes a day of aerobic activity, 2-3 days per week.  Home exercise guidelines will be given to patient during program as part of exercise prescription that the participant will acknowledge.  Education: Aerobic Exercise: - Group verbal and visual presentation on the components of exercise prescription.  Introduces F.I.T.T principle from ACSM for exercise prescriptions.  Reviews F.I.T.T. principles of aerobic exercise including progression. Written material given at graduation.   Education: Resistance Exercise: - Group verbal and visual presentation on the components of exercise prescription. Introduces F.I.T.T principle from ACSM for exercise prescriptions  Reviews F.I.T.T. principles of resistance exercise including progression. Written material given at graduation.    Education: Exercise & Equipment Safety: - Individual verbal instruction and demonstration of equipment use and safety with use of the equipment. Flowsheet Row Pulmonary Rehab from 06/30/2023 in [facility] Cardiac and Pulmonary Rehab  Date 06/30/23  Educator MB  Instruction Review Code 1- Verbalizes Understanding       Education: Exercise Physiology & General Exercise Guidelines: - Group verbal and written instruction with models to review the exercise physiology of the cardiovascular system and associated critical values. Provides general exercise guidelines with specific guidelines to those with heart or lung disease.    Education: Flexibility, Balance, Mind/Body Relaxation: - Group verbal and visual presentation with interactive activity on the components of exercise prescription. Introduces F.I.T.T principle from ACSM for exercise prescriptions. Reviews F.I.T.T. principles of flexibility and balance exercise training including progression. Also discusses the mind body connection.  Reviews various relaxation techniques to help reduce and manage stress (i.e. Deep breathing, progressive muscle relaxation, and visualization). Balance handout provided to take home. Written material given at graduation.   Activity Barriers & Risk Stratification:  Activity Barriers & Cardiac Risk Stratification - 06/30/23 1617       Activity Barriers & Cardiac Risk Stratification   Activity Barriers Arthritis;Left Hip Replacement;Right Hip  Replacement;Back Problems;Other (comment);Shortness of Breath    Comments R patella fracture             6 Minute Walk:  6 Minute Walk     Row Name 06/30/23 1615         6 Minute Walk   Phase Initial     Distance 600 feet     Walk Time 5.5 minutes     # of Rest Breaks 1     MPH 1.24     METS 1.38     RPE 15     Perceived Dyspnea  3     VO2 Peak 4.83     Symptoms Yes (comment)     Comments SOB     Resting HR 73 bpm     Resting BP 140/60     Resting Oxygen Saturation  92 %     Exercise Oxygen Saturation  during 6 min walk 92 %     Max Ex. HR 96 bpm     Max Ex. BP 160/66     2 Minute Post BP 138/70       Interval HR   1 Minute HR 86     2 Minute HR 91     3 Minute HR 93     4 Minute HR 93     5 Minute HR 96     6 Minute HR 93     2 Minute Post HR 77     Interval Heart Rate? Yes       Interval Oxygen   Interval Oxygen? Yes     Baseline Oxygen Saturation % 92 %     1 Minute Oxygen Saturation % 93 %     1 Minute Liters of Oxygen 0 L     2 Minute Oxygen Saturation % 92 %     2 Minute Liters of Oxygen 0  L     3 Minute Oxygen Saturation % 93 %     3 Minute Liters of Oxygen 0 L     4 Minute Oxygen Saturation % 93 %     4 Minute Liters of Oxygen 0 L     5 Minute Oxygen Saturation % 92 %     5 Minute Liters of Oxygen 0 L     6 Minute Oxygen Saturation % 92 %     6 Minute Liters of Oxygen 0 L     2 Minute Post Oxygen Saturation % 94 %     2 Minute Post Liters of Oxygen 0 L             Oxygen Initial Assessment:  Oxygen Initial Assessment - 06/28/23 1026       Home Oxygen   Home Oxygen Device None    Sleep Oxygen Prescription None    Home Exercise Oxygen Prescription None    Home Resting Oxygen Prescription None      Initial 6 min Walk   Oxygen Used None      Program Oxygen Prescription   Program Oxygen Prescription None      Intervention   Short Term Goals To learn and exhibit compliance with exercise, home and travel O2 prescription;To learn  and understand importance of monitoring SPO2 with pulse oximeter and demonstrate accurate use of the pulse oximeter.;To learn and understand importance of maintaining oxygen saturations>88%;To learn and demonstrate proper pursed lip breathing techniques or other breathing techniques. ;To learn and demonstrate proper use of respiratory medications    Long  Term Goals Exhibits compliance with exercise, home  and travel O2 prescription;Verbalizes importance of monitoring SPO2 with pulse oximeter and return demonstration;Maintenance of O2 saturations>88%;Exhibits proper breathing techniques, such as pursed lip breathing or other method taught during program session;Compliance with respiratory medication;Demonstrates proper use of MDI's             Oxygen Re-Evaluation:  Oxygen Re-Evaluation     Row Name 07/07/23 1137 08/09/23 1124 09/01/23 1113 09/27/23 1143       Program Oxygen Prescription   Program Oxygen Prescription None None None None      Home Oxygen   Home Oxygen Device None None None None    Sleep Oxygen Prescription None None None None    Home Exercise Oxygen Prescription None None None None    Home Resting Oxygen Prescription None None None None    Compliance with Home Oxygen Use Yes Yes -- Yes      Goals/Expected Outcomes   Short Term Goals To learn and demonstrate proper pursed lip breathing techniques or other breathing techniques.  To learn and demonstrate proper pursed lip breathing techniques or other breathing techniques.  To learn and understand importance of maintaining oxygen saturations>88%;To learn and understand importance of monitoring SPO2 with pulse oximeter and demonstrate accurate use of the pulse oximeter. To learn and demonstrate proper pursed lip breathing techniques or other breathing techniques.     Long  Term Goals Exhibits proper breathing techniques, such as pursed lip breathing or other method taught during program session Exhibits proper breathing  techniques, such as pursed lip breathing or other method taught during program session Maintenance of O2 saturations>88%;Verbalizes importance of monitoring SPO2 with pulse oximeter and return demonstration Exhibits proper breathing techniques, such as pursed lip breathing or other method taught during program session    Comments Reviewed PLB technique with pt.  Talked about how it works and it's  importance in maintaining their exercise saturations. -- She does not have a pulse oximeter to check her oxygen saturation at home. Informed her where to get one and explained why it is important to have one. Reviewed that oxygen saturations should be 88 percent and above. Patient verbalizes understanding. Spoke with Pam about PLB and she verbalized understanding    Goals/Expected Outcomes Short: Become more profiecient at using PLB. Long: Become independent at using PLB. -- Short: monitor oxygen at home with exertion. Long: maintain oxygen saturations above 88 percent independently. STG: monitor O2 at home during exertion. LTG: maintain oxygen saturations above 88 percent independently.             Oxygen Discharge (Final Oxygen Re-Evaluation):  Oxygen Re-Evaluation - 09/27/23 1143       Program Oxygen Prescription   Program Oxygen Prescription None      Home Oxygen   Home Oxygen Device None    Sleep Oxygen Prescription None    Home Exercise Oxygen Prescription None    Home Resting Oxygen Prescription None    Compliance with Home Oxygen Use Yes      Goals/Expected Outcomes   Short Term Goals To learn and demonstrate proper pursed lip breathing techniques or other breathing techniques.     Long  Term Goals Exhibits proper breathing techniques, such as pursed lip breathing or other method taught during program session    Comments Spoke with Pam about PLB and she verbalized understanding    Goals/Expected Outcomes STG: monitor O2 at home during exertion. LTG: maintain oxygen saturations above 88  percent independently.             Initial Exercise Prescription:  Initial Exercise Prescription - 06/30/23 1600       Date of Initial Exercise RX and Referring Provider   Date 06/30/23    Referring Provider Assaker, Marianne Shirts, MD      Oxygen   Maintain Oxygen Saturation 88% or higher      NuStep   Level 1    SPM 80    Minutes 15    METs 1.38      Arm Ergometer   Level 1    RPM 30    Minutes 15    METs 1.38      Biostep-RELP   Level 1    SPM 50    Minutes 15    METs 1.38      Track   Laps 9    Minutes 15    METs 1.49      Prescription Details   Frequency (times per week) 2    Duration Progress to 30 minutes of continuous aerobic without signs/symptoms of physical distress      Intensity   THRR 40-80% of Max Heartrate 101-129    Ratings of Perceived Exertion 11-13    Perceived Dyspnea 0-4      Progression   Progression Continue to progress workloads to maintain intensity without signs/symptoms of physical distress.      Resistance Training   Training Prescription Yes    Weight 2 lb upper, 4 lb lower    Reps 10-15             Perform Capillary Blood Glucose checks as needed.  Exercise Prescription Changes:   Exercise Prescription Changes     Row Name 06/30/23 1600 07/21/23 1100 08/05/23 0800 08/19/23 1800 08/23/23 1100     Response to Exercise   Blood Pressure (Admit) 140/60 120/62 122/64 118/74 --  Blood Pressure (Exercise) 160/66 148/70 142/80 140/80 --   Blood Pressure (Exit) 138/70 118/60 118/62 122/62 --   Heart Rate (Admit) 73 bpm 80 bpm 78 bpm 74 bpm --   Heart Rate (Exercise) 96 bpm 102 bpm 88 bpm 84 bpm --   Heart Rate (Exit) 77 bpm 83 bpm 74 bpm 76 bpm --   Oxygen Saturation (Admit) 92 % 94 % 91 % 92 % --   Oxygen Saturation (Exercise) 92 % 93 % 90 % 92 % --   Oxygen Saturation (Exit) 94 % 96 % 95 % 92 % --   Rating of Perceived Exertion (Exercise) 15 14 15 15  --   Perceived Dyspnea (Exercise) 3 4 3 2  --   Symptoms SOB  SOB SOB SOB --   Comments results First two days of exercise in rehab -- -- --   Duration Progress to 30 minutes of  aerobic without signs/symptoms of physical distress Continue with 30 min of aerobic exercise without signs/symptoms of physical distress. Continue with 30 min of aerobic exercise without signs/symptoms of physical distress. Continue with 30 min of aerobic exercise without signs/symptoms of physical distress. Continue with 30 min of aerobic exercise without signs/symptoms of physical distress.   Intensity THRR New THRR unchanged THRR unchanged THRR unchanged THRR unchanged     Progression   Progression Continue to progress workloads to maintain intensity without signs/symptoms of physical distress. Continue to progress workloads to maintain intensity without signs/symptoms of physical distress. Continue to progress workloads to maintain intensity without signs/symptoms of physical distress. Continue to progress workloads to maintain intensity without signs/symptoms of physical distress. Continue to progress workloads to maintain intensity without signs/symptoms of physical distress.   Average METs 1.38 1.83 1.9 1.89 1.89     Resistance Training   Training Prescription -- Yes Yes Yes Yes   Weight -- 2 lb 2 lb 2 lb 2 lb   Reps -- 10-15 10-15 10-15 10-15     Interval Training   Interval Training -- No No No No     Treadmill   MPH -- -- 1 -- --   Grade -- -- 0 -- --   Minutes -- -- 15 -- --   METs -- -- 1.77 -- --     NuStep   Level -- 1 4 2 2    Minutes -- 15 15 15 15    METs -- 2.7 2.4 2.2 2.2     T5 Nustep   Level -- 1 1  T6 nustep -- --   Minutes -- 15 15 -- --   METs -- 1.8 1.7 -- --     Biostep-RELP   Level -- -- -- 1 1   Minutes -- -- -- 15 15   METs -- -- -- 2 2     Track   Laps -- 9 -- 12 12   Minutes -- 15 -- 15 15   METs -- 1.49 -- 1.65 1.65     Home Exercise Plan   Plans to continue exercise at -- -- -- -- [employer] (comment)  fitness  center in home community   Frequency -- -- -- -- Add 2 additional days to program exercise sessions.   Initial Home Exercises Provided -- -- -- -- 08/23/23     Oxygen   Maintain Oxygen Saturation -- 88% or higher 88% or higher 88% or higher 88% or higher    Row Name 09/02/23 0700 09/15/23 1400 09/29/23 1400  Response to Exercise   Blood Pressure (Admit) 122/68 122/62 130/60     Blood Pressure (Exercise) 140/62 -- --     Blood Pressure (Exit) 118/60 124/64 120/62     Heart Rate (Admit) 67 bpm 69 bpm 80 bpm     Heart Rate (Exercise) 90 bpm 88 bpm 97 bpm     Heart Rate (Exit) 74 bpm 70 bpm 72 bpm     Oxygen Saturation (Admit) 95 % 95 % 96 %     Oxygen Saturation (Exercise) 93 % 90 % 90 %     Oxygen Saturation (Exit) 94 % 92 % 94 %     Rating of Perceived Exertion (Exercise) 14 15 15      Perceived Dyspnea (Exercise) 3 3 3      Symptoms none non none     Duration Continue with 30 min of aerobic exercise without signs/symptoms of physical distress. Continue with 30 min of aerobic exercise without signs/symptoms of physical distress. Continue with 30 min of aerobic exercise without signs/symptoms of physical distress.     Intensity THRR unchanged THRR unchanged THRR unchanged       Progression   Progression Continue to progress workloads to maintain intensity without signs/symptoms of physical distress. Continue to progress workloads to maintain intensity without signs/symptoms of physical distress. Continue to progress workloads to maintain intensity without signs/symptoms of physical distress.     Average METs 1.64 1.89 1.94       Resistance Training   Training Prescription Yes Yes Yes     Weight 2 lb 2 lb 2 lb     Reps 10-15 10-15 10-15       Interval Training   Interval Training No No No       Recumbant Bike   Level -- 1 --     Watts -- 12 --     Minutes -- 15 --     METs -- 2.5 --       NuStep   Level 3 3 2      Minutes 15 15 15      METs 2.8 2.4 2.4       T5  Nustep   Level 1  T6 -- 2  T6 nustep     Minutes 15 -- 15       Biostep-RELP   Level -- 1 1     Minutes -- 15 15     METs -- 2 2       Track   Laps 5 12 13      Minutes 15 15 15      METs 1.27 1.65 1.71       Home Exercise Plan   Plans to continue exercise at [employer] (comment)  fitness center in home community [employer] (comment)  fitness center in home community [employer] (comment)  fitness center in home community     Frequency Add 2 additional days to program exercise sessions. Add 2 additional days to program exercise sessions. Add 2 additional days to program exercise sessions.     Initial Home Exercises Provided 08/23/23 08/23/23 08/23/23       Oxygen   Maintain Oxygen Saturation 88% or higher 88% or higher 88% or higher              Exercise Comments:   Exercise Comments     Row Name 07/07/23 1138           Exercise Comments First full day of exercise!  Patient was oriented  to gym and equipment including functions, settings, policies, and procedures.  Patient's individual exercise prescription and treatment plan were reviewed.  All starting workloads were established based on the results of the 6 minute walk test done at initial orientation visit.  The plan for exercise progression was also introduced and progression will be customized based on patient's performance and goals.                Exercise Goals and Review:   Exercise Goals     Row Name 06/30/23 1621             Exercise Goals   Increase Physical Activity Yes       Intervention Provide advice, education, support and counseling about physical activity/exercise needs.;Develop an individualized exercise prescription for aerobic and resistive training based on initial evaluation findings, risk stratification, comorbidities and participant's personal goals.       Expected Outcomes Short Term: Attend rehab on a regular basis to increase amount of physical activity.;Long  Term: Add in home exercise to make exercise part of routine and to increase amount of physical activity.;Long Term: Exercising regularly at least 3-5 days a week.       Increase Strength and Stamina Yes       Intervention Provide advice, education, support and counseling about physical activity/exercise needs.;Develop an individualized exercise prescription for aerobic and resistive training based on initial evaluation findings, risk stratification, comorbidities and participant's personal goals.       Expected Outcomes Short Term: Increase workloads from initial exercise prescription for resistance, speed, and METs.;Short Term: Perform resistance training exercises routinely during rehab and add in resistance training at home;Long Term: Improve cardiorespiratory fitness, muscular endurance and strength as measured by increased METs and functional capacity ( )       Able to understand and use rate of perceived exertion (RPE) scale Yes       Intervention Provide education and explanation on how to use RPE scale       Expected Outcomes Short Term: Able to use RPE daily in rehab to express subjective intensity level;Long Term:  Able to use RPE to guide intensity level when exercising independently       Able to understand and use Dyspnea scale Yes       Intervention Provide education and explanation on how to use Dyspnea scale       Expected Outcomes Short Term: Able to use Dyspnea scale daily in rehab to express subjective sense of shortness of breath during exertion;Long Term: Able to use Dyspnea scale to guide intensity level when exercising independently       Knowledge and understanding of Target Heart Rate Range (THRR) Yes       Intervention Provide education and explanation of THRR including how the numbers were predicted and where they are located for reference       Expected Outcomes Short Term: Able to state/look up THRR;Short Term: Able to use daily as guideline for intensity in rehab;Long  Term: Able to use THRR to govern intensity when exercising independently       Able to check pulse independently Yes       Intervention Provide education and demonstration on how to check pulse in carotid and radial arteries.;Review the importance of being able to check your own pulse for safety during independent exercise       Expected Outcomes Short Term: Able to explain why pulse checking is important during independent exercise;Long Term: Able to check pulse independently and  accurately       Understanding of Exercise Prescription Yes       Intervention Provide education, explanation, and written materials on patient's individual exercise prescription       Expected Outcomes Short Term: Able to explain program exercise prescription;Long Term: Able to explain home exercise prescription to exercise independently                Exercise Goals Re-Evaluation :  Exercise Goals Re-Evaluation     Row Name 07/07/23 1136 07/21/23 1136 08/05/23 0842 08/09/23 1122 08/19/23 1828     Exercise Goal Re-Evaluation   Exercise Goals Review Able to understand and use rate of perceived exertion (RPE) scale;Able to understand and use Dyspnea scale;Knowledge and understanding of Target Heart Rate Range (THRR);Understanding of Exercise Prescription Increase Physical Activity;Increase Strength and Stamina;Understanding of Exercise Prescription Increase Physical Activity;Increase Strength and Stamina;Understanding of Exercise Prescription Increase Physical Activity;Increase Strength and Stamina;Understanding of Exercise Prescription Increase Physical Activity;Increase Strength and Stamina;Understanding of Exercise Prescription   Comments Reviewed RPE and dyspnea scale, THR and program prescription with pt today.  Pt voiced understanding and was given a copy of goals to take home. Marven Slimmer is off to a good start in rehab. She was able to walk up to 9 laps on the track. She also did well at level 1 on the T4 and T5 nustep  machines. We will continue to monitor her progress in the program. Marven Slimmer is doing well in rehab. She began using the treadmill and did well at 1 mph with no incline. She also improved to level 4 on the T4 nustep and did well with 2 lb hand weights for resistance training. We will continue to monitor her progress in the program. Marven Slimmer is doing well here at rehab, reports she is not doing much at home. Encouraged her to increase workload here at rehab as able. She says some days she is exhausted after exercise and affects her ability to do daily task. Marven Slimmer continues to do well in the program. She was recently able to increase her laps on the track from 9 to 12. We will continue to monitor her progress in the program.   Expected Outcomes Short: Use RPE daily to regulate intensity. Long: Follow program prescription in THR. Short: Continue to follow current exercise prescription. Long: Continue exercise to improve strength and stamina. Short: Continue to progressively increase workloads. Long: Continue exercise to improve strength and stamina. STG: increase workload as able. LTG: Continue exercise to improve strength and stamina Short: Increase level on the Biostep to 2. Long: Continue exercise to improve strength and stamina.    Row Name 08/23/23 1159 09/02/23 0759 09/15/23 1431 09/27/23 1141 09/29/23 1434     Exercise Goal Re-Evaluation   Exercise Goals Review Increase Physical Activity;Able to understand and use rate of perceived exertion (RPE) scale;Knowledge and understanding of Target Heart Rate Range (THRR);Understanding of Exercise Prescription;Increase Strength and Stamina;Able to understand and use Dyspnea scale;Able to check pulse independently Increase Physical Activity;Understanding of Exercise Prescription;Increase Strength and Stamina Increase Physical Activity;Understanding of Exercise Prescription;Increase Strength and Stamina Increase Physical Activity;Increase Strength and Stamina;Understanding of  Exercise Prescription Increase Physical Activity;Increase Strength and Stamina;Understanding of Exercise Prescription   Comments Reviewed home exercise with pt today from 11:20 am to 11:32 am.  Pt plans to finish PT and then start going to her community fitness center for exercise.  Reviewed THR, pulse, RPE, sign and symptoms, pulse oximetery and when to call 911 or MD.  Also discussed  weather considerations and indoor options.  Pt voiced understanding. Tineka is doing well in rehab. She increased to level 3 on the T4 nustep. She decreased to 5 laps from 12 laps on the track in this review. We will continue to monitor her progress in the program. Marven Slimmer is doing well in rehab. She was able to maintain her intensity on the T4 nustep at level 3. She was also able to return to 12 laps on the track in 15 minutes. We will continue to monitor her progress in the program. Marven Slimmer is doing well in rehab. She is walking the track and using the T4. Encouraged her to increase workload on T4 as able. She has been getting more laps as of late. She is not exercising much outside of rehab, she is walking some. SPoke with her about long term plan and looking to find a gym to do stepper exercise similar to the T4. Marven Slimmer continues to do well in rehab. She increased her laps on the track as she most recently walked 13 laps. She also improved to level 2 on the T6 nustep. We will continue to monitor her progress in the program.   Expected Outcomes Short: add1-2 days a week of exercise at home on off days of rehab. Long: maintain independent exercise routine. Short: Push for more laps on the track to get back to 12 laps. Long: Continue exercise to improve strength and stamina. Short: Increase to level 4 on the T4 nustep, increase to level 2 on the biostep. Long: Continue exercise to improve strength and stamina. STG: increase workload on T4, find a gym. LTG: Continue exercise to improve strength and stamina. Short: Continue to push for more  laps on the track. Long: Continue exercise to improve strength and stamina.            Discharge Exercise Prescription (Final Exercise Prescription Changes):  Exercise Prescription Changes - 09/29/23 1400       Response to Exercise   Blood Pressure (Admit) 130/60    Blood Pressure (Exit) 120/62    Heart Rate (Admit) 80 bpm    Heart Rate (Exercise) 97 bpm    Heart Rate (Exit) 72 bpm    Oxygen Saturation (Admit) 96 %    Oxygen Saturation (Exercise) 90 %    Oxygen Saturation (Exit) 94 %    Rating of Perceived Exertion (Exercise) 15    Perceived Dyspnea (Exercise) 3    Symptoms none    Duration Continue with 30 min of aerobic exercise without signs/symptoms of physical distress.    Intensity THRR unchanged      Progression   Progression Continue to progress workloads to maintain intensity without signs/symptoms of physical distress.    Average METs 1.94      Resistance Training   Training Prescription Yes    Weight 2 lb    Reps 10-15      Interval Training   Interval Training No      NuStep   Level 2    Minutes 15    METs 2.4      T5 Nustep   Level 2   T6 nustep   Minutes 15      Biostep-RELP   Level 1    Minutes 15    METs 2      Track   Laps 13    Minutes 15    METs 1.71      Home Exercise Plan   Plans to continue exercise at [employer] (  comment)   fitness center in home community   Frequency Add 2 additional days to program exercise sessions.    Initial Home Exercises Provided 08/23/23      Oxygen   Maintain Oxygen Saturation 88% or higher             Nutrition:  Target Goals: Understanding of nutrition guidelines, daily intake of sodium 1500mg , cholesterol 200mg , calories 30% from fat and 7% or less from saturated fats, daily to have 5 or more servings of fruits and vegetables.  Education: All About Nutrition: -Group instruction provided by verbal, written material, interactive activities, discussions, models, and posters to  present general guidelines for heart healthy nutrition including fat, fiber, MyPlate, the role of sodium in heart healthy nutrition, utilization of the nutrition label, and utilization of this knowledge for meal planning. Follow up email sent as well. Written material given at graduation.   Biometrics:  Pre Biometrics - 06/30/23 1622       Pre Biometrics   Height 5' 3.5" (1.613 m)    Weight 165 lb 12.8 oz (75.2 kg)    Waist Circumference 39.5 inches    Hip Circumference 44.9 inches    Waist to Hip Ratio 0.88 %    BMI (Calculated) 28.91    Single Leg Stand 6.9 seconds              Nutrition Therapy Plan and Nutrition Goals:  Nutrition Therapy & Goals - 06/30/23 1605       Nutrition Therapy   Diet Carb controlled, cardiac, low Na    Protein (specify units) 75g    Fiber 25 grams    Whole Grain Foods 3 servings    Saturated Fats 15 max. grams    Fruits and Vegetables 5 servings/day    Sodium 2 grams      Personal Nutrition Goals   Nutrition Goal Eat 15-30gProtein and 30-60gCarbs at each meal.    Personal Goal #2 Practice mindful snacking and limit sugary treats to a few times per week    Personal Goal #3 Read labels and reduce sodium intake to below 2300mg . Ideally 1500mg  per day.    Comments Patient drinking ~60oz of water  daily. She usually eats 3 times per day, likes fruits and veggies. Doesn't eat much animal meat, but does like fish and some chicken. She struggles with snacking and sugary foods. Her A1C is ~6.0%. She is aware of carb choices and portions. Reviewed mediterranean diet handout, educated on types of fats, sources, and how to read label. Provided sample meal plates handout and build out meals and snacks with foods she likes and will eat, focusing on adequate protein while controlling complex carb portions and including lots of colorful produce.      Intervention Plan   Intervention Prescribe, educate and counsel regarding individualized specific dietary  modifications aiming towards targeted core components such as weight, hypertension, lipid management, diabetes, heart failure and other comorbidities.;Nutrition handout(s) given to patient.    Expected Outcomes Short Term Goal: Understand basic principles of dietary content, such as calories, fat, sodium, cholesterol and nutrients.;Short Term Goal: A plan has been developed with personal nutrition goals set during dietitian appointment.;Long Term Goal: Adherence to prescribed nutrition plan.             Nutrition Assessments:  MEDIFICTS Score Key: >=70 Need to make dietary changes  40-70 Heart Healthy Diet <= 40 Therapeutic Level Cholesterol Diet  Flowsheet Row Pulmonary Rehab from 07/07/2023 in [facility]  Picture Your Plate Total Score on Admission 72      Picture Your Plate Scores: <16 Unhealthy dietary pattern with much room for improvement. 41-50 Dietary pattern unlikely to meet recommendations for good health and room for improvement. 51-60 More healthful dietary pattern, with some room for improvement.  >60 Healthy dietary pattern, although there may be some specific behaviors that could be improved.   Nutrition Goals Re-Evaluation:  Nutrition Goals Re-Evaluation     Row Name 08/09/23 1128 09/01/23 1117 09/27/23 1147         Goals   Comment Spoke with her about healthy eating, including lots of colorful produce and quality protein. Controlling carb choices to pick complex carbs over refined, processed and concentrated sugary products. Patient was informed on why it is important to maintain a balanced diet when dealing with Respiratory issues. Explained that it takes a lot of energy to breath and when they are short of breath often they will need to have a good diet to help keep up with the calories they are expending for breathing. Pam feels she is doing well on nutrition. Says she is eating smaller portions but feels she needs to do better with adding  protien. Set goal to increase protein intake. RD reviewed and brainstormed some ideas to include more protein into her meals and snacks.     Expected Outcome STG: meet protein goal of >70g. LTG: follow a heart healthy diet and lifestyle plan Short: Choose and plan snacks accordingly to patients caloric intake to improve breathing. Long: Maintain a diet independently that meets their caloric intake to aid in daily shortness of breath. STG: increase proten intake, include protien at most meals and snacks. LTG: Maintain a diet independently that meets their caloric intake to aid in daily shortness of breath.              Nutrition Goals Discharge (Final Nutrition Goals Re-Evaluation):  Nutrition Goals Re-Evaluation - 09/27/23 1147       Goals   Comment Pam feels she is doing well on nutrition. Says she is eating smaller portions but feels she needs to do better with adding protien. Set goal to increase protein intake. RD reviewed and brainstormed some ideas to include more protein into her meals and snacks.    Expected Outcome STG: increase proten intake, include protien at most meals and snacks. LTG: Maintain a diet independently that meets their caloric intake to aid in daily shortness of breath.             Psychosocial: Target Goals: Acknowledge presence or absence of significant depression and/or stress, maximize coping skills, provide positive support system. Participant is able to verbalize types and ability to use techniques and skills needed for reducing stress and depression.   Education: Stress, Anxiety, and Depression - Group verbal and visual presentation to define topics covered.  Reviews how body is impacted by stress, anxiety, and depression.  Also discusses healthy ways to reduce stress and to treat/manage anxiety and depression.  Written material given at graduation.   Education: Sleep Hygiene -Provides group verbal and written instruction about how sleep can affect your  health.  Define sleep hygiene, discuss sleep cycles and impact of sleep habits. Review good sleep hygiene tips.    Initial Review & Psychosocial Screening:  Initial Psych Review & Screening - 06/28/23 1028       Initial Review   Current issues with Current Sleep Concerns;Current Stress Concerns    Source of Stress Concerns Chronic  Illness      Family Dynamics   Good Support System? Yes    Comments Nyala lives by herself and can call her daughterif she needs support. Sometimes she has problems sleeping due to her shortness of breath. She states no depression and does not take anything for her mood.      Barriers   Psychosocial barriers to participate in program The patient should benefit from training in stress management and relaxation.      Screening Interventions   Interventions Encouraged to exercise;To provide support and resources with identified psychosocial needs;Provide feedback about the scores to participant    Expected Outcomes Short Term goal: Utilizing psychosocial counselor, staff and physician to assist with identification of specific Stressors or current issues interfering with healing process. Setting desired goal for each stressor or current issue identified.;Long Term Goal: Stressors or current issues are controlled or eliminated.;Short Term goal: Identification and review with participant of any Quality of Life or Depression concerns found by scoring the questionnaire.;Long Term goal: The participant improves quality of Life and PHQ9 Scores as seen by post scores and/or verbalization of changes             Quality of Life Scores:  Scores of 19 and below usually indicate a poorer quality of life in these areas.  A difference of  2-3 points is a clinically meaningful difference.  A difference of 2-3 points in the total score of the Quality of Life Index has been associated with significant improvement in overall quality of life, self-image, physical symptoms, and  general health in studies assessing change in quality of life.  PHQ-9: Review Flowsheet  More data exists      09/01/2023 06/30/2023 01/14/2022 12/26/2020 12/21/2019  Depression screen PHQ 2/9  Decreased Interest 1 1 0 0 0  Down, Depressed, Hopeless 0 0 0 0 0  PHQ - 2 Score 1 1 0 0 0  Altered sleeping 1 2 - - -  Tired, decreased energy 1 2 - - -  Change in appetite 0 0 - - -  Feeling bad or failure about yourself  0 0 - - -  Trouble concentrating 0 1 - - -  Moving slowly or fidgety/restless 0 0 - - -  Suicidal thoughts 0 0 - - -  PHQ-9 Score 3 6 - - -  Difficult doing work/chores Somewhat difficult Very difficult - - -   Interpretation of Total Score  Total Score Depression Severity:  1-4 = Minimal depression, 5-9 = Mild depression, 10-14 = Moderate depression, 15-19 = Moderately severe depression, 20-27 = Severe depression   Psychosocial Evaluation and Intervention:  Psychosocial Evaluation - 06/28/23 1030       Psychosocial Evaluation & Interventions   Interventions Relaxation education;Stress management education;Encouraged to exercise with the program and follow exercise prescription    Comments Faye lives by herself and can call her daughterif she needs support. Sometimes she has problems sleeping due to her shortness of breath. She states no depression and does not take anything for her mood.    Expected Outcomes Short: Attend [facility] stress management education to decrease stress. Long: Maintain exercise Post [facility] to keep stress at a minimum.    Continue Psychosocial Services  Follow up required by staff             Psychosocial Re-Evaluation:  Psychosocial Re-Evaluation     Row Name 08/09/23 1125 09/01/23 1126 09/27/23 1144         Psychosocial Re-Evaluation  Current issues with Current Sleep Concerns Current Stress Concerns Current Stress Concerns     Comments Marven Slimmer states she isnt not experiencing any anxiety depression or stress currently. Says she has  a good support system of friends that have similar health issues. She says she has always struggled with sleep, some days she sleeps well others not well at all. She does take naps during the day. Days she exercises, she often takes a nap, but then struggles to sleep later in the night. Reviewed patient health questionnaire (PHQ-9) with patient for follow up. Previously, patients score indicated signs/symptoms of depression.  Reviewed to see if patient is improving symptom wise while in program.  Score improved and patient states that it is because she has been able to exercise more. Bree reports she is doing well with mental health as of late. Says coming to rehab and exercising has helped her relief stress. Says she is sleeping well. She reports she has a good support system.     Expected Outcomes STG: continue to focus on getting good rest from naps and sleep. LTG: maintain a positive outlook on health and daily life Short: Continue to attend [facility] regularly for regular exercise and social engagement. Long: Continue to improve symptoms and manage a positive mental state. STG: focus on good sleep hygiene, attend rehab for exercise and stress relief. LTG: Continue to improve symptoms and manage a positive mental state.     Interventions Encouraged to attend Pulmonary Rehabilitation for the exercise Encouraged to attend Pulmonary Rehabilitation for the exercise Encouraged to attend Pulmonary Rehabilitation for the exercise     Continue Psychosocial Services  Follow up required by staff Follow up required by staff Follow up required by staff       Initial Review   Source of Stress Concerns -- -- Chronic Illness              Psychosocial Discharge (Final Psychosocial Re-Evaluation):  Psychosocial Re-Evaluation - 09/27/23 1144       Psychosocial Re-Evaluation   Current issues with Current Stress Concerns    Comments Bree reports she is doing well with mental health as of late. Says coming  to rehab and exercising has helped her relief stress. Says she is sleeping well. She reports she has a good support system.    Expected Outcomes STG: focus on good sleep hygiene, attend rehab for exercise and stress relief. LTG: Continue to improve symptoms and manage a positive mental state.    Interventions Encouraged to attend Pulmonary Rehabilitation for the exercise    Continue Psychosocial Services  Follow up required by staff      Initial Review   Source of Stress Concerns Chronic Illness             Education: Education Goals: Education classes will be provided on a weekly basis, covering required topics. Participant will state understanding/return demonstration of topics presented.  Learning Barriers/Preferences:  Learning Barriers/Preferences - 06/28/23 1027       Learning Barriers/Preferences   Learning Barriers None    Learning Preferences None             General Pulmonary Education Topics:  Infection Prevention: - Provides verbal and written material to individual with discussion of infection control including proper hand washing and proper equipment cleaning during exercise session. Flowsheet Row Pulmonary Rehab from 06/30/2023 in [facility] Card[facility] and Pulmonary Rehab  Date 06/30/23  Educator MB  Instruction Review Code 1- Verbalizes Understanding  Falls Prevention: - Provides verbal and written material to individual with discussion of falls prevention and safety. Flowsheet Row Pulmonary Rehab from 06/30/2023 in [facility] Cardiac and Pulmonary Rehab  Date 06/30/23  Educator MB  Instruction Review Code 1- Verbalizes Understanding       Chronic Lung Disease Review: - Group verbal instruction with posters, models, PowerPoint presentations and videos,  to review new updates, new respiratory medications, new advancements in procedures and treatments. Providing information on websites and "800" numbers for continued self-education. Includes information about  supplement oxygen, available portable oxygen systems, continuous and intermittent flow rates, oxygen safety, concentrators, and Medicare reimbursement for oxygen. Explanation of Pulmonary Drugs, including class, frequency, complications, importance of spacers, rinsing mouth after steroid MDI's, and proper cleaning methods for nebulizers. Review of basic lung anatomy and physiology related to function, structure, and complications of lung disease. Review of risk factors. Discussion about methods for diagnosing sleep apnea and types of masks and machines for OSA. Includes a review of the use of types of environmental controls: home humidity, furnaces, filters, dust mite/pet prevention, HEPA vacuums. Discussion about weather changes, air quality and the benefits of nasal washing. Instruction on Warning signs, infection symptoms, calling MD promptly, preventive modes, and value of vaccinations. Review of effective airway clearance, coughing and/or vibration techniques. Emphasizing that all should Create an Action Plan. Written material given at graduation.   AED/CPR: - Group verbal and written instruction with the use of models to demonstrate the basic use of the AED with the basic ABC's of resuscitation.    Anatomy and Cardiac Procedures: - Group verbal and visual presentation and models provide information about basic cardiac anatomy and function. Reviews the testing methods done to diagnose heart disease and the outcomes of the test results. Describes the treatment choices: Medical Management, Angioplasty, or Coronary Bypass Surgery for treating various heart conditions including Myocardial Infarction, Angina, Valve Disease, and Cardiac Arrhythmias.  Written material given at graduation.   Medication Safety: - Group verbal and visual instruction to review commonly prescribed medications for heart and lung disease. Reviews the medication, class of the drug, and side effects. Includes the steps to properly  store meds and maintain the prescription regimen.  Written material given at graduation.   Other: -Provides group and verbal instruction on various topics (see comments)   Knowledge Questionnaire Score:  Knowledge Questionnaire Score - 07/07/23 1349       Knowledge Questionnaire Score   Pre Score 14/18              Core Components/Risk Factors/Patient Goals at Admission:  Personal Goals and Risk Factors at Admission - 06/30/23 1622       Core Components/Risk Factors/Patient Goals on Admission    Weight Management Yes;Weight Loss    Intervention Weight Management: Develop a combined nutrition and exercise program designed to reach desired caloric intake, while maintaining appropriate intake of nutrient and fiber, sodium and fats, and appropriate energy expenditure required for the weight goal.;Weight Management: Provide education and appropriate resources to help participant work on and attain dietary goals.;Weight Management/Obesity: Establish reasonable short term and long term weight goals.;Obesity: Provide education and appropriate resources to help participant work on and attain dietary goals.    Admit Weight 165 lb 12.8 oz (75.2 kg)    Goal Weight: Short Term 158 lb 12.8 oz (72 kg)    Goal Weight: Long Term 151 lb 12.8 oz (68.9 kg)    Expected Outcomes Short Term: Continue to assess and modify interventions until  short term weight is achieved;Long Term: Adherence to nutrition and physical activity/exercise program aimed toward attainment of established weight goal;Weight Loss: Understanding of general recommendations for a balanced deficit meal plan, which promotes 1-2 lb weight loss per week and includes a negative energy balance of [phone] kcal/d;Understanding recommendations for meals to include 15-35% energy as protein, 25-35% energy from fat, 35-60% energy from carbohydrates, less than 200mg  of dietary cholesterol, 20-35 gm of total fiber daily;Understanding of distribution  of calorie intake throughout the day with the consumption of 4-5 meals/snacks    Improve shortness of breath with ADL's Yes    Intervention Provide education, individualized exercise plan and daily activity instruction to help decrease symptoms of SOB with activities of daily living.    Expected Outcomes Short Term: Improve cardiorespiratory fitness to achieve a reduction of symptoms when performing ADLs;Long Term: Be able to perform more ADLs without symptoms or delay the onset of symptoms    Hypertension Yes    Intervention Provide education on lifestyle modifcations including regular physical activity/exercise, weight management, moderate sodium restriction and increased consumption of fresh fruit, vegetables, and low fat dairy, alcohol  moderation, and smoking cessation.;Monitor prescription use compliance.    Expected Outcomes Short Term: Continued assessment and intervention until BP is < 140/8mm HG in hypertensive participants. < 130/59mm HG in hypertensive participants with diabetes, heart failure or chronic kidney disease.;Long Term: Maintenance of blood pressure at goal levels.    Lipids Yes    Intervention Provide education and support for participant on nutrition & aerobic/resistive exercise along with prescribed medications to achieve LDL 70mg , HDL >40mg .    Expected Outcomes Short Term: Participant states understanding of desired cholesterol values and is compliant with medications prescribed. Participant is following exercise prescription and nutrition guidelines.;Long Term: Cholesterol controlled with medications as prescribed, with individualized exercise RX and with personalized nutrition plan. Value goals: LDL < 70mg , HDL > 40 mg.             Education:Diabetes - Individual verbal and written instruction to review signs/symptoms of diabetes, desired ranges of glucose level fasting, after meals and with exercise. Acknowledge that pre and post exercise glucose checks will be done  for 3 sessions at entry of program.   Know Your Numbers and Heart Failure: - Group verbal and visual instruction to discuss disease risk factors for cardiac and pulmonary disease and treatment options.  Reviews associated critical values for Overweight/Obesity, Hypertension, Cholesterol, and Diabetes.  Discusses basics of heart failure: signs/symptoms and treatments.  Introduces Heart Failure Zone chart for action plan for heart failure.  Written material given at graduation.   Core Components/Risk Factors/Patient Goals Review:   Goals and Risk Factor Review     Row Name 08/09/23 1130 09/01/23 1116 09/27/23 1149         Core Components/Risk Factors/Patient Goals Review   Personal Goals Review Weight Management/Obesity Improve shortness of breath with ADL's Improve shortness of breath with ADL's     Review Bree gained weight when being ill, she is now looking to lose some of that gained weight. She is more active than before but still unable to lose the weight. Encouraged her to increase workload as able and eat low caloire nutrient dense foods often. The weight loss might take some time as her physcial exercise is limited due to shortness of breath. Spoke to patient about their shortness of breath and what they can do to improve. Patient has been informed of breathing techniques when starting the program. Patient is  informed to tell staff if they have had any med changes and that certain meds they are taking or not taking can be causing shortness of breath. Pam reports her shortness of breath during ADLs has improved slightly and credits that improvement on the exercise done here. Encouraged her continue to exercise and prevent her shortness of breath from getting worse     Expected Outcomes STG: lose 5% of body weight. LTG: acheve and maintain healthy weight Short: Attend [facility] regularly to improve shortness of breath with ADL's. Long: maintain independence with ADL's STG: Attend rehab and make  exercise plan for post rehab. LTG: maintain independence with ADL's              Core Components/Risk Factors/Patient Goals at Discharge (Final Review):   Goals and Risk Factor Review - 09/27/23 1149       Core Components/Risk Factors/Patient Goals Review   Personal Goals Review Improve shortness of breath with ADL's    Review Pam reports her shortness of breath during ADLs has improved slightly and credits that improvement on the exercise done here. Encouraged her continue to exercise and prevent her shortness of breath from getting worse    Expected Outcomes STG: Attend rehab and make exercise plan for post rehab. LTG: maintain independence with ADL's             ITP Comments:  ITP Comments     Row Name 06/28/23 1025 06/30/23 1614 07/07/23 1138 07/14/23 0812 08/11/23 0819   ITP Comments Virtual Visit completed. Patient informed on EP and RD appointment and 6 Minute walk test. Patient also informed of patient health questionnaires on My Chart. Patient Verbalizes understanding. Visit diagnosis can be found in [facility]. Completed and gym orientation. Initial ITP created and sent for review to Dr. Faud Aleskerov, Medical Director. First full day of exercise!  Patient was oriented to gym and equipment including functions, settings, policies, and procedures.  Patient's individual exercise prescription and treatment plan were reviewed.  All starting workloads were established based on the results of the 6 minute walk test done at initial orientation visit.  The plan for exercise progression was also introduced and progression will be customized based on patient's performance and goals. 30 Day review completed. Medical Director ITP review done, changes made as directed, and signed approval by Medical Director. Newer to Program 30 Day review completed. Medical Director ITP review done, changes made as directed, and signed approval by Medical Director.    Row Name 09/08/23 0941 10/06/23  0856         ITP Comments 30 Day review completed. Medical Director ITP review done, changes made as directed, and signed approval by Medical Director. 30 Day review completed. Medical Director ITP review done, changes made as directed, and signed approval by Medical Director.               Comments: 30 day review

## 2023-10-06 NOTE — Progress Notes (Signed)
 30 Day review completed. Medical Director ITP review done, changes made as directed, and signed approval by Medical Director. ? ?

## 2023-10-06 NOTE — Progress Notes (Signed)
 Daily Session Note  Patient Details  Name: Deanna [name] MRN: [id number] Date of Birth: 12-18-1945 Referring Provider:   Flowsheet Row Pulmonary Rehab from 06/30/2023 in [facility] Cardiac and Pulmonary Rehab  Referring Provider Annitta Kindler, MD       Encounter Date: 10/06/2023  Check In:  Session Check In - 10/06/23 1117       Check-In   Supervising physician immediately available to respond to emergencies See telemetry face sheet for immediately available ER MD    Location [facility]-Cardiac & Pulmonary Rehab    Staff Present Maud Sorenson, RN, BSN, CCRP;Meredith Manson Seitz RN,BSN;Joseph Hood RCP,RRT,BSRT;Maxon Milesburg BS, Exercise Physiologist;Laureen Bevin Bucks, BS, RRT, CPFT    Virtual Visit No    Medication changes reported     No    Fall or balance concerns reported    No    Warm-up and Cool-down Performed on first and last piece of equipment    Resistance Training Performed Yes    VAD Patient? No    PAD/SET Patient? No      Pain Assessment   Currently in Pain? No/denies                Social History   Tobacco Use  Smoking Status Former   Current packs/day: 0.00   Average packs/day: 0.5 packs/day for 30.0 years (15.0 ttl pk-yrs)   Types: Cigarettes   Start date: 01/01/1968   Quit date: 12/31/1997   Years since quitting: 25.7  Smokeless Tobacco Never  Tobacco Comments   Quit in 1999    Goals Met:  Proper associated with RPD/PD & O2 Sat Independence with exercise equipment Exercise tolerated well No report of concerns or symptoms today  Goals Unmet:  Not Applicable  Comments: Pt able to follow exercise prescription today without complaint.  Will continue to monitor for progression.    Dr. Firman Hughes is Medical Director for [facility] Cardiac Rehabilitation.  Dr. Fuad Aleskerov is Medical Director for [facility] Pulmonary Rehabilitation.

## 2023-10-07 ENCOUNTER — Ambulatory Visit: Payer: MEDICARE

## 2023-10-10 DIAGNOSIS — E859 Amyloidosis, unspecified: Secondary | ICD-10-CM | POA: Insufficient documentation

## 2023-10-10 DIAGNOSIS — I429 Cardiomyopathy, unspecified: Secondary | ICD-10-CM | POA: Insufficient documentation

## 2023-10-10 DIAGNOSIS — I422 Other hypertrophic cardiomyopathy: Secondary | ICD-10-CM | POA: Insufficient documentation

## 2023-10-10 HISTORY — DX: Other hypertrophic cardiomyopathy: I42.2

## 2023-10-11 NOTE — Telephone Encounter (Signed)
 Spoke with pt who states she had episode of feeling lightheaded Friday that resolved. Pt has no BP/HR readings at the time of her lightheaded episode. BP yesterday was 114/64 HR 59. Pt reports that she drinks 8 bottles of water  daily. Pt also states that she is leaving for [place] on Thursday and that if her medications need to be adjusted it need to be done sooner rather than later. Please advise

## 2023-10-12 MED ORDER — METOPROLOL SUCCINATE ER 50 MG PO TB24: 50.0000 mg | ORAL_TABLET | Freq: Every day | ORAL | 3 refills | Status: DC

## 2023-10-12 NOTE — Addendum Note (Signed)
 Addended by: Einar Grave on: 10/12/2023 07:39 AM   Modules accepted: Orders

## 2023-10-13 ENCOUNTER — Ambulatory Visit: Payer: Self-pay

## 2023-10-13 NOTE — Addendum Note (Signed)
 Addended by: Einar Grave on: 10/13/2023 07:58 AM   Modules accepted: Orders

## 2023-10-13 NOTE — Telephone Encounter (Signed)
 Orders from result note

## 2023-10-28 ENCOUNTER — Telehealth: Payer: Self-pay

## 2023-10-28 NOTE — Telephone Encounter (Signed)
 We called Deanna [name] today as she has not attended rehab since 10/06/2023. Pt stated that she has been out of the country and will return on Monday, 11/01/2023.

## 2023-11-01 ENCOUNTER — Encounter: Payer: MEDICARE | Attending: Pulmonary Disease

## 2023-11-01 DIAGNOSIS — R0602 Shortness of breath: Secondary | ICD-10-CM | POA: Diagnosis present

## 2023-11-01 NOTE — Progress Notes (Signed)
 Daily Session Note  Patient Details  Name: Deanna [name] MRN: [id number] Date of Birth: June 13, 1945 Referring Provider:   Flowsheet Row Pulmonary Rehab from 06/30/2023 in [facility] Cardiac and Pulmonary Rehab  Referring Provider Annitta Kindler, MD       Encounter Date: 11/01/2023  Check In:  Session Check In - 11/01/23 1120       Check-In   Supervising physician immediately available to respond to emergencies See telemetry face sheet for immediately available ER MD    Location [facility]-Cardiac & Pulmonary Rehab    Staff Present Lyell Samuel, MS, Exercise Physiologist;Charnell Peplinski Sabra Cramp BS, ACSM CEP, Exercise Physiologist;Eriyana Sweeten RN,BSN,MPA;Joseph Hood RCP,RRT,BSRT    Virtual Visit No    Medication changes reported     No    Fall or balance concerns reported    No    Tobacco Cessation No Change    Warm-up and Cool-down Performed on first and last piece of equipment    Resistance Training Performed Yes    VAD Patient? No    PAD/SET Patient? No      Pain Assessment   Currently in Pain? No/denies                Social History   Tobacco Use  Smoking Status Former   Current packs/day: 0.00   Average packs/day: 0.5 packs/day for 30.0 years (15.0 ttl pk-yrs)   Types: Cigarettes   Start date: 01/01/1968   Quit date: 12/31/1997   Years since quitting: 25.8  Smokeless Tobacco Never  Tobacco Comments   Quit in 1999    Goals Met:  Independence with exercise equipment Exercise tolerated well No report of concerns or symptoms today Strength training completed today  Goals Unmet:  Not Applicable  Comments: Pt able to follow exercise prescription today without complaint.  Will continue to monitor for progression.    Dr. Firman Hughes is Medical Director for [facility] Cardiac Rehabilitation.  Dr. Fuad Aleskerov is Medical Director for [facility] Pulmonary Rehabilitation.

## 2023-11-03 ENCOUNTER — Encounter: Payer: Self-pay | Admitting: *Deleted

## 2023-11-03 DIAGNOSIS — R0602 Shortness of breath: Secondary | ICD-10-CM

## 2023-11-03 NOTE — Progress Notes (Signed)
 Pulmonary Individual Treatment Plan  Patient Details  Name: Deanna [name] MRN: [id number] Date of Birth: 12-28-1945 Referring Provider:   Flowsheet Row Pulmonary Rehab from 06/30/2023 in [facility] Cardiac and Pulmonary Rehab  Referring Provider Assaker, Marianne Shirts, MD       Initial Encounter Date:  Flowsheet Row Pulmonary Rehab from 06/30/2023 in [facility] Cardiac and Pulmonary Rehab  Date 06/30/23       Visit Diagnosis: SOB (shortness of breath)  Patient's Home Medications on Admission:  Current Outpatient Medications:    albuterol  (VENTOLIN  HFA) 108 (90 Base) MCG/ACT inhaler, Inhale 2 puffs into the lungs every 6 (six) hours as needed for wheezing or shortness of breath., Disp: 8 g, Rfl: 2   apixaban  (ELIQUIS ) 5 MG TABS tablet, Take 1 tablet (5 mg total) by mouth 2 (two) times daily. Hold medication from 9/15 to 9/21 for right hip surgery, Disp: 180 tablet, Rfl: 1   Ascorbic Acid  (VITAMIN C ) 1000 MG tablet, Take 4,000 mg by mouth daily., Disp: , Rfl:    azithromycin  (ZITHROMAX ) 250 MG tablet, Take 1 tablet (250 mg total) by mouth daily. Take first 2 tablets together, then 1 every day until finished., Disp: 6 tablet, Rfl: 0   budesonide -formoterol  (SYMBICORT ) 160-4.5 MCG/ACT inhaler, Inhale 2 puffs into the lungs in the morning and at bedtime., Disp: 1 each, Rfl: 12   Calcium -Magnesium  (CAL-MAG PO), Take 2,000 mg by mouth at bedtime. 1000 mg, Disp: , Rfl:    Carboxymethylcellul-Glycerin  (CLEAR EYES FOR DRY EYES OP), Place 1 drop into both eyes daily., Disp: , Rfl:    [employer] MAGNESIUM  OXIDE 250 MG TABS, TAKE 1 TABLET (250 MG TOTAL) BY MOUTH EVERY EVENING. TAKE WITH EVENING MEAL, Disp: 100 tablet, Rfl: 1   furosemide  (LASIX ) 20 MG tablet, Take 20 mg by mouth as needed., Disp: , Rfl:    hydrochlorothiazide  (HYDRODIURIL ) 25 MG tablet, Take 1 tablet (25 mg total) by mouth 2 (two) times daily., Disp: 180 tablet, Rfl: 3   HYDROcodone -acetaminophen  (NORCO/VICODIN) 5-325 MG tablet, Take 1-2 tablets by  mouth every 4 (four) hours as needed for severe pain., Disp: 42 tablet, Rfl: 0   loratadine  (CLARITIN ) 10 MG tablet, Take 10 mg by mouth daily as needed for allergies., Disp: , Rfl:    losartan  (COZAAR ) 50 MG tablet, TAKE 1 TABLET BY MOUTH EVERY DAY, Disp: 90 tablet, Rfl: 1   methocarbamol  (ROBAXIN ) 500 MG tablet, Take 1 tablet (500 mg total) by mouth every 6 (six) hours as needed for muscle spasms (muscle pain)., Disp: 40 tablet, Rfl: 2   metoprolol  succinate (TOPROL  XL) 50 MG 24 hr tablet, Take 1 tablet (50 mg total) by mouth daily. Take with or immediately following a meal., Disp: 90 tablet, Rfl: 3   milk thistle 175 MG tablet, Take 175 mg by mouth daily., Disp: , Rfl:    nitroGLYCERIN  (NITROSTAT ) 0.4 MG SL tablet, Place 1 tablet (0.4 mg total) under the tongue every 5 (five) minutes x 3 doses as needed for chest pain., Disp: 25 tablet, Rfl: 1   polyethylene glycol (MIRALAX  / GLYCOLAX ) 17 g packet, Take 17 g by mouth 2 (two) times daily., Disp: 14 each, Rfl: 0   Probiotic Product (PROBIOTIC DAILY PO), Take 1 tablet by mouth daily., Disp: , Rfl:    rosuvastatin  (CRESTOR ) 20 MG tablet, Take 1 tablet (20 mg total) by mouth daily., Disp: 30 tablet, Rfl: 6   umeclidinium bromide  (INCRUSE ELLIPTA ) 62.5 MCG/ACT AEPB, Inhale 1 puff into the lungs daily., Disp: 1 each,  Rfl: 6   Vitamin A  2400 MCG (8000 UT) CAPS, Take 16,000 Units by mouth daily. 8000 units, Disp: , Rfl:    Vitamin D, Ergocalciferol, (DRISDOL) 1.25 MG (50000 UNIT) CAPS capsule, Take 50,000 Units by mouth once a week., Disp: , Rfl:   Past Medical History: Past Medical History:  Diagnosis Date   A-fib (HCC)    Abnormal glucose 03/06/2020   Acute coronary syndrome (HCC) 02/09/2023   Age-related osteoporosis without current pathological fracture 11/22/2015   Overview:  Femoral neck  Formatting of this note might be different from the original. Femoral neck   Allergic rhinitis due to pollen 08/19/2015   Allergy    Aortic atherosclerosis  (HCC) 04/04/2021   Arthritis    Carpal tunnel syndrome, bilateral 02/28/2002   Overview:  H/O CTS   Celiac disease    Cervical disc disorder 05/10/2009   Chronic cough 02/15/2006   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 11/26/2015   Formatting of this note might be different from the original. Added automatically from request for surgery [id number]   Chronic pain syndrome 04/01/2015   Colon polyps    Dyspnea    Essential hypertension 07/20/2000   Overview:  HBP   Family history of adverse reaction to anesthesia    mother delerium   Female cystocele 02/27/2009   GAD (generalized anxiety disorder) 08/19/2015   GERD (gastroesophageal reflux disease)    Heart murmur    History of Bell's palsy 03/21/2015   Hx of adenomatous colonic polyps 12/20/2008   Overview:  Colonoscopy 07/2003 - [facility] - polyps removed - recommended repeat in 3 years Colonoscopy 09/2011 - Dr. Aleta Anda - hyperplastic polyp(s) were removed - repeat in 5 years   Hypertension    Incomplete uterovaginal prolapse 03/04/2004   Insomnia    Left bundle-branch block 05/20/2006   Lumbar radiculopathy 01/09/2016   Formatting of this note might be different from the original. 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 Femoral neck   Other intervertebral disc degeneration, lumbar region 01/29/2015   Pre-operative cardiovascular examination 09/03/2020   Primary osteoarthritis, right hand 02/28/2002   Overview:  OA HANDS, ESP THUMBS   Radial tunnel syndrome, right 11/04/2016   Radiculopathy, lumbar region 11/22/2014   Overview:  Added automatically from request for surgery  [id number]   Rhinitis, allergic 02/24/2002   Overview:  ALLERGY SYMPTOMS - states was seen by allergist in past and informed allergic to dust   S/P left total hip arthroplasty 09/17/2020   S/P total right hip arthroplasty 02/18/2023   Sacroiliitis (HCC) 08/02/2019   Screening for colon cancer 04/17/2005   Formatting of this note might be different from the original. 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 TRIGGER THUMBS - started spring 2003. Injected 02/28/02   Unspecified asthma, uncomplicated 12/25/2004    Tobacco Use: Social History  Tobacco Use  Smoking Status Former   Current packs/day: 0.00   Average packs/day: 0.5 packs/day for 30.0 years (15.0 ttl pk-yrs)   Types: Cigarettes   Start date: 01/01/1968   Quit date: 12/31/1997   Years since quitting: 25.8  Smokeless Tobacco Never  Tobacco Comments   Quit in 1999    Labs: Review Flowsheet  More data exists      Latest Ref Rng & Units 01/23/2021 06/26/2021 11/04/2021 02/10/2023 02/11/2023  Labs for ITP Cardiac and Pulmonary Rehab  Cholestrol 0 - 200 mg/dL - - 829  562  -  LDL (calc) 0 - 99 mg/dL - - 89  96  -  HDL-C >13 mg/dL - - 54  42  -  Trlycerides <150 mg/dL - - 47  086  -  Hemoglobin A1c 4.8 - 5.6 % 6.1  6.0  5.8  - 6.0      Pulmonary Assessment Scores:  Pulmonary Assessment Scores     Row Name 07/07/23 1348         ADL UCSD   ADL Phase Entry     SOB Score total 76     Rest 1     Walk 4     Stairs 5     Bath 2     Dress 3     Shop 3       CAT Score   CAT Score 21              UCSD: Self-administered rating of dyspnea associated with activities of  daily living (ADLs) 6-point scale (0 = "not at all" to 5 = "maximal or unable to do because of breathlessness")  Scoring Scores range from 0 to 120.  Minimally important difference is 5 units  CAT: CAT can identify the health impairment of COPD patients and is better correlated with disease progression.  CAT has a scoring range of zero to 40. The CAT score is classified into four groups of low (less than 10), medium (10 - 20), high (21-30) and very high (31-40) based on the impact level of disease on health status. A CAT score over 10 suggests significant symptoms.  A worsening CAT score could be explained by an exacerbation, poor medication adherence, poor inhaler technique, or progression of COPD or comorbid conditions.  CAT MCID is 2 points  mMRC: mMRC (Modified Medical Research Council) Dyspnea Scale is used to assess the degree of baseline functional disability in patients of respiratory disease due to dyspnea. No minimal important difference is established. A decrease in score of 1 point or greater is considered a positive change.   Pulmonary Function Assessment:  Pulmonary Function Assessment - 06/28/23 1027       Breath   Shortness of Breath Yes;Limiting activity;Fear of Shortness of Breath             Exercise Target Goals: Exercise Program Goal: Individual exercise prescription set using results from initial 6 min walk test and THRR while considering  patient's activity barriers and safety.   Exercise Prescription Goal: Initial exercise prescription builds to 30-45 minutes a day of aerobic activity, 2-3 days per week.  Home exercise guidelines will be given to patient during program as part of exercise prescription that the participant will acknowledge.  Education: Aerobic Exercise: - Group verbal and visual presentation on the components of exercise prescription. Introduces F.I.T.T principle from ACSM for exercise prescriptions.  Reviews F.I.T.T. principles of aerobic  exercise including progression. Written material given at graduation.  Education: Resistance Exercise: - Group verbal and visual presentation on the components of exercise prescription. Introduces F.I.T.T principle from ACSM for exercise prescriptions  Reviews F.I.T.T. principles of resistance exercise including progression. Written material given at graduation.    Education: Exercise & Equipment Safety: - Individual verbal instruction and demonstration of equipment use and safety with use of the equipment. Flowsheet Row Pulmonary Rehab from 06/30/2023 in [facility] Cardiac and Pulmonary Rehab  Date 06/30/23  Educator MB  Instruction Review Code 1- Verbalizes Understanding       Education: Exercise Physiology & General Exercise Guidelines: - Group verbal and written instruction with models to review the exercise physiology of the cardiovascular system and associated critical values. Provides general exercise guidelines with specific guidelines to those with heart or lung disease.    Education: Flexibility, Balance, Mind/Body Relaxation: - Group verbal and visual presentation with interactive activity on the components of exercise prescription. Introduces F.I.T.T principle from ACSM for exercise prescriptions. Reviews F.I.T.T. principles of flexibility and balance exercise training including progression. Also discusses the mind body connection.  Reviews various relaxation techniques to help reduce and manage stress (i.e. Deep breathing, progressive muscle relaxation, and visualization). Balance handout provided to take home. Written material given at graduation.   Activity Barriers & Risk Stratification:  Activity Barriers & Cardiac Risk Stratification - 06/30/23 1617       Activity Barriers & Cardiac Risk Stratification   Activity Barriers Arthritis;Left Hip Replacement;Right Hip Replacement;Back Problems;Other (comment);Shortness of Breath    Comments R patella fracture              6 Minute Walk:  6 Minute Walk     Row Name 06/30/23 1615         6 Minute Walk   Phase Initial     Distance 600 feet     Walk Time 5.5 minutes     # of Rest Breaks 1     MPH 1.24     METS 1.38     RPE 15     Perceived Dyspnea  3     VO2 Peak 4.83     Symptoms Yes (comment)     Comments SOB     Resting HR 73 bpm     Resting BP 140/60     Resting Oxygen Saturation  92 %     Exercise Oxygen Saturation  during 6 min walk 92 %     Max Ex. HR 96 bpm     Max Ex. BP 160/66     2 Minute Post BP 138/70       Interval HR   1 Minute HR 86     2 Minute HR 91     3 Minute HR 93     4 Minute HR 93     5 Minute HR 96     6 Minute HR 93     2 Minute Post HR 77     Interval Heart Rate? Yes       Interval Oxygen   Interval Oxygen? Yes     Baseline Oxygen Saturation % 92 %     1 Minute Oxygen Saturation % 93 %     1 Minute Liters of Oxygen 0 L     2 Minute Oxygen Saturation % 92 %     2 Minute Liters of Oxygen 0 L     3 Minute Oxygen Saturation % 93 %     3 Minute Liters of Oxygen 0 L  4 Minute Oxygen Saturation % 93 %     4 Minute Liters of Oxygen 0 L     5 Minute Oxygen Saturation % 92 %     5 Minute Liters of Oxygen 0 L     6 Minute Oxygen Saturation % 92 %     6 Minute Liters of Oxygen 0 L     2 Minute Post Oxygen Saturation % 94 %     2 Minute Post Liters of Oxygen 0 L             Oxygen Initial Assessment:  Oxygen Initial Assessment - 06/28/23 1026       Home Oxygen   Home Oxygen Device None    Sleep Oxygen Prescription None    Home Exercise Oxygen Prescription None    Home Resting Oxygen Prescription None      Initial 6 min Walk   Oxygen Used None      Program Oxygen Prescription   Program Oxygen Prescription None      Intervention   Short Term Goals To learn and exhibit compliance with exercise, home and travel O2 prescription;To learn and understand importance of monitoring SPO2 with pulse oximeter and demonstrate accurate use of the pulse  oximeter.;To learn and understand importance of maintaining oxygen saturations>88%;To learn and demonstrate proper pursed lip breathing techniques or other breathing techniques. ;To learn and demonstrate proper use of respiratory medications    Long  Term Goals Exhibits compliance with exercise, home  and travel O2 prescription;Verbalizes importance of monitoring SPO2 with pulse oximeter and return demonstration;Maintenance of O2 saturations>88%;Exhibits proper breathing techniques, such as pursed lip breathing or other method taught during program session;Compliance with respiratory medication;Demonstrates proper use of MDI's             Oxygen Re-Evaluation:  Oxygen Re-Evaluation     Row Name 07/07/23 1137 08/09/23 1124 09/01/23 1113 09/27/23 1143       Program Oxygen Prescription   Program Oxygen Prescription None None None None      Home Oxygen   Home Oxygen Device None None None None    Sleep Oxygen Prescription None None None None    Home Exercise Oxygen Prescription None None None None    Home Resting Oxygen Prescription None None None None    Compliance with Home Oxygen Use Yes Yes -- Yes      Goals/Expected Outcomes   Short Term Goals To learn and demonstrate proper pursed lip breathing techniques or other breathing techniques.  To learn and demonstrate proper pursed lip breathing techniques or other breathing techniques.  To learn and understand importance of maintaining oxygen saturations>88%;To learn and understand importance of monitoring SPO2 with pulse oximeter and demonstrate accurate use of the pulse oximeter. To learn and demonstrate proper pursed lip breathing techniques or other breathing techniques.     Long  Term Goals Exhibits proper breathing techniques, such as pursed lip breathing or other method taught during program session Exhibits proper breathing techniques, such as pursed lip breathing or other method taught during program session Maintenance of O2  saturations>88%;Verbalizes importance of monitoring SPO2 with pulse oximeter and return demonstration Exhibits proper breathing techniques, such as pursed lip breathing or other method taught during program session    Comments Reviewed PLB technique with pt.  Talked about how it works and it's importance in maintaining their exercise saturations. -- She does not have a pulse oximeter to check her oxygen saturation at home. Informed her where to get one  and explained why it is important to have one. Reviewed that oxygen saturations should be 88 percent and above. Patient verbalizes understanding. Spoke with Deanna [name] about PLB and she verbalized understanding    Goals/Expected Outcomes Short: Become more profiecient at using PLB. Long: Become independent at using PLB. -- Short: monitor oxygen at home with exertion. Long: maintain oxygen saturations above 88 percent independently. STG: monitor O2 at home during exertion. LTG: maintain oxygen saturations above 88 percent independently.             Oxygen Discharge (Final Oxygen Re-Evaluation):  Oxygen Re-Evaluation - 09/27/23 1143       Program Oxygen Prescription   Program Oxygen Prescription None      Home Oxygen   Home Oxygen Device None    Sleep Oxygen Prescription None    Home Exercise Oxygen Prescription None    Home Resting Oxygen Prescription None    Compliance with Home Oxygen Use Yes      Goals/Expected Outcomes   Short Term Goals To learn and demonstrate proper pursed lip breathing techniques or other breathing techniques.     Long  Term Goals Exhibits proper breathing techniques, such as pursed lip breathing or other method taught during program session    Comments Spoke with Deanna [name] about PLB and she verbalized understanding    Goals/Expected Outcomes STG: monitor O2 at home during exertion. LTG: maintain oxygen saturations above 88 percent independently.             Initial Exercise Prescription:  Initial Exercise Prescription -  06/30/23 1600       Date of Initial Exercise RX and Referring Provider   Date 06/30/23    Referring Provider Assaker, Marianne Shirts, MD      Oxygen   Maintain Oxygen Saturation 88% or higher      NuStep   Level 1    SPM 80    Minutes 15    METs 1.38      Arm Ergometer   Level 1    RPM 30    Minutes 15    METs 1.38      Biostep-RELP   Level 1    SPM 50    Minutes 15    METs 1.38      Track   Laps 9    Minutes 15    METs 1.49      Prescription Details   Frequency (times per week) 2    Duration Progress to 30 minutes of continuous aerobic without signs/symptoms of physical distress      Intensity   THRR 40-80% of Max Heartrate 101-129    Ratings of Perceived Exertion 11-13    Perceived Dyspnea 0-4      Progression   Progression Continue to progress workloads to maintain intensity without signs/symptoms of physical distress.      Resistance Training   Training Prescription Yes    Weight 2 lb upper, 4 lb lower    Reps 10-15             Perform Capillary Blood Glucose checks as needed.  Exercise Prescription Changes:   Exercise Prescription Changes     Row Name 06/30/23 1600 07/21/23 1100 08/05/23 0800 08/19/23 1800 08/23/23 1100     Response to Exercise   Blood Pressure (Admit) 140/60 120/62 122/64 118/74 --   Blood Pressure (Exercise) 160/66 148/70 142/80 140/80 --   Blood Pressure (Exit) 138/70 118/60 118/62 122/62 --   Heart Rate (Admit) 73 bpm 80 bpm  78 bpm 74 bpm --   Heart Rate (Exercise) 96 bpm 102 bpm 88 bpm 84 bpm --   Heart Rate (Exit) 77 bpm 83 bpm 74 bpm 76 bpm --   Oxygen Saturation (Admit) 92 % 94 % 91 % 92 % --   Oxygen Saturation (Exercise) 92 % 93 % 90 % 92 % --   Oxygen Saturation (Exit) 94 % 96 % 95 % 92 % --   Rating of Perceived Exertion (Exercise) 15 14 15 15  --   Perceived Dyspnea (Exercise) 3 4 3 2  --   Symptoms SOB SOB SOB SOB --   Comments results First two days of exercise in rehab -- -- --   Duration Progress to  30 minutes of  aerobic without signs/symptoms of physical distress Continue with 30 min of aerobic exercise without signs/symptoms of physical distress. Continue with 30 min of aerobic exercise without signs/symptoms of physical distress. Continue with 30 min of aerobic exercise without signs/symptoms of physical distress. Continue with 30 min of aerobic exercise without signs/symptoms of physical distress.   Intensity THRR New THRR unchanged THRR unchanged THRR unchanged THRR unchanged     Progression   Progression Continue to progress workloads to maintain intensity without signs/symptoms of physical distress. Continue to progress workloads to maintain intensity without signs/symptoms of physical distress. Continue to progress workloads to maintain intensity without signs/symptoms of physical distress. Continue to progress workloads to maintain intensity without signs/symptoms of physical distress. Continue to progress workloads to maintain intensity without signs/symptoms of physical distress.   Average METs 1.38 1.83 1.9 1.89 1.89     Resistance Training   Training Prescription -- Yes Yes Yes Yes   Weight -- 2 lb 2 lb 2 lb 2 lb   Reps -- 10-15 10-15 10-15 10-15     Interval Training   Interval Training -- No No No No     Treadmill   MPH -- -- 1 -- --   Grade -- -- 0 -- --   Minutes -- -- 15 -- --   METs -- -- 1.77 -- --     NuStep   Level -- 1 4 2 2    Minutes -- 15 15 15 15    METs -- 2.7 2.4 2.2 2.2     T5 Nustep   Level -- 1 1  T6 nustep -- --   Minutes -- 15 15 -- --   METs -- 1.8 1.7 -- --     Biostep-RELP   Level -- -- -- 1 1   Minutes -- -- -- 15 15   METs -- -- -- 2 2     Track   Laps -- 9 -- 12 12   Minutes -- 15 -- 15 15   METs -- 1.49 -- 1.65 1.65     Home Exercise Plan   Plans to continue exercise at -- -- -- -- [employer] (comment)  fitness center in home community   Frequency -- -- -- -- Add 2 additional days to program exercise sessions.   Initial  Home Exercises Provided -- -- -- -- 08/23/23     Oxygen   Maintain Oxygen Saturation -- 88% or higher 88% or higher 88% or higher 88% or higher    Row Name 09/02/23 0700 09/15/23 1400 09/29/23 1400 10/14/23 1300       Response to Exercise   Blood Pressure (Admit) 122/68 122/62 130/60 122/64    Blood Pressure (Exercise) 140/62 -- -- --  Blood Pressure (Exit) 118/60 124/64 120/62 140/64    Heart Rate (Admit) 67 bpm 69 bpm 80 bpm 72 bpm    Heart Rate (Exercise) 90 bpm 88 bpm 97 bpm 102 bpm    Heart Rate (Exit) 74 bpm 70 bpm 72 bpm 75 bpm    Oxygen Saturation (Admit) 95 % 95 % 96 % 90 %    Oxygen Saturation (Exercise) 93 % 90 % 90 % 90 %    Oxygen Saturation (Exit) 94 % 92 % 94 % 95 %    Rating of Perceived Exertion (Exercise) 14 15 15 15     Perceived Dyspnea (Exercise) 3 3 3 3     Symptoms none non none none    Duration Continue with 30 min of aerobic exercise without signs/symptoms of physical distress. Continue with 30 min of aerobic exercise without signs/symptoms of physical distress. Continue with 30 min of aerobic exercise without signs/symptoms of physical distress. Continue with 30 min of aerobic exercise without signs/symptoms of physical distress.    Intensity THRR unchanged THRR unchanged THRR unchanged THRR unchanged      Progression   Progression Continue to progress workloads to maintain intensity without signs/symptoms of physical distress. Continue to progress workloads to maintain intensity without signs/symptoms of physical distress. Continue to progress workloads to maintain intensity without signs/symptoms of physical distress. Continue to progress workloads to maintain intensity without signs/symptoms of physical distress.    Average METs 1.64 1.89 1.94 2.05      Resistance Training   Training Prescription Yes Yes Yes Yes    Weight 2 lb 2 lb 2 lb 2 lb    Reps 10-15 10-15 10-15 10-15      Interval Training   Interval Training No No No No      Recumbant Bike    Level -- 1 -- --    Watts -- 12 -- --    Minutes -- 15 -- --    METs -- 2.5 -- --      NuStep   Level 3 3 2 4     Minutes 15 15 15 15     METs 2.8 2.4 2.4 2.6      T5 Nustep   Level 1  T6 -- 2  T6 nustep 2    Minutes 15 -- 15 15    METs -- -- -- 1.9      Biostep-RELP   Level -- 1 1 1     Minutes -- 15 15 15     METs -- 2 2 2       Track   Laps 5 12 13 15     Minutes 15 15 15 15     METs 1.27 1.65 1.71 1.82      Home Exercise Plan   Plans to continue exercise at [employer] (comment)  fitness center in home community [employer] (comment)  fitness center in home community [employer] (comment)  fitness center in home community [employer] (comment)  fitness center in home community    Frequency Add 2 additional days to program exercise sessions. Add 2 additional days to program exercise sessions. Add 2 additional days to program exercise sessions. Add 2 additional days to program exercise sessions.    Initial Home Exercises Provided 08/23/23 08/23/23 08/23/23 08/23/23      Oxygen   Maintain Oxygen Saturation 88% or higher 88% or higher 88% or higher 88% or higher             Exercise Comments:   Exercise  Comments     Row Name 07/07/23 1138           Exercise Comments First full day of exercise!  Patient was oriented to gym and equipment including functions, settings, policies, and procedures.  Patient's individual exercise prescription and treatment plan were reviewed.  All starting workloads were established based on the results of the 6 minute walk test done at initial orientation visit.  The plan for exercise progression was also introduced and progression will be customized based on patient's performance and goals.                Exercise Goals and Review:   Exercise Goals     Row Name 06/30/23 1621             Exercise Goals   Increase Physical Activity Yes       Intervention Provide advice, education, support and counseling  about physical activity/exercise needs.;Develop an individualized exercise prescription for aerobic and resistive training based on initial evaluation findings, risk stratification, comorbidities and participant's personal goals.       Expected Outcomes Short Term: Attend rehab on a regular basis to increase amount of physical activity.;Long Term: Add in home exercise to make exercise part of routine and to increase amount of physical activity.;Long Term: Exercising regularly at least 3-5 days a week.       Increase Strength and Stamina Yes       Intervention Provide advice, education, support and counseling about physical activity/exercise needs.;Develop an individualized exercise prescription for aerobic and resistive training based on initial evaluation findings, risk stratification, comorbidities and participant's personal goals.       Expected Outcomes Short Term: Increase workloads from initial exercise prescription for resistance, speed, and METs.;Short Term: Perform resistance training exercises routinely during rehab and add in resistance training at home;Long Term: Improve cardiorespiratory fitness, muscular endurance and strength as measured by increased METs and functional capacity ( )       Able to understand and use rate of perceived exertion (RPE) scale Yes       Intervention Provide education and explanation on how to use RPE scale       Expected Outcomes Short Term: Able to use RPE daily in rehab to express subjective intensity level;Long Term:  Able to use RPE to guide intensity level when exercising independently       Able to understand and use Dyspnea scale Yes       Intervention Provide education and explanation on how to use Dyspnea scale       Expected Outcomes Short Term: Able to use Dyspnea scale daily in rehab to express subjective sense of shortness of breath during exertion;Long Term: Able to use Dyspnea scale to guide intensity level when exercising independently        Knowledge and understanding of Target Heart Rate Range (THRR) Yes       Intervention Provide education and explanation of THRR including how the numbers were predicted and where they are located for reference       Expected Outcomes Short Term: Able to state/look up THRR;Short Term: Able to use daily as guideline for intensity in rehab;Long Term: Able to use THRR to govern intensity when exercising independently       Able to check pulse independently Yes       Intervention Provide education and demonstration on how to check pulse in carotid and radial arteries.;Review the importance of being able to check your own pulse for safety during  independent exercise       Expected Outcomes Short Term: Able to explain why pulse checking is important during independent exercise;Long Term: Able to check pulse independently and accurately       Understanding of Exercise Prescription Yes       Intervention Provide education, explanation, and written materials on patient's individual exercise prescription       Expected Outcomes Short Term: Able to explain program exercise prescription;Long Term: Able to explain home exercise prescription to exercise independently                Exercise Goals Re-Evaluation :  Exercise Goals Re-Evaluation     Row Name 07/07/23 1136 07/21/23 1136 08/05/23 0842 08/09/23 1122 08/19/23 1828     Exercise Goal Re-Evaluation   Exercise Goals Review Able to understand and use rate of perceived exertion (RPE) scale;Able to understand and use Dyspnea scale;Knowledge and understanding of Target Heart Rate Range (THRR);Understanding of Exercise Prescription Increase Physical Activity;Increase Strength and Stamina;Understanding of Exercise Prescription Increase Physical Activity;Increase Strength and Stamina;Understanding of Exercise Prescription Increase Physical Activity;Increase Strength and Stamina;Understanding of Exercise Prescription Increase Physical Activity;Increase Strength  and Stamina;Understanding of Exercise Prescription   Comments Reviewed RPE and dyspnea scale, THR and program prescription with pt today.  Pt voiced understanding and was given a copy of goals to take home. Deanna [name] is off to a good start in rehab. She was able to walk up to 9 laps on the track. She also did well at level 1 on the T4 and T5 nustep machines. We will continue to monitor her progress in the program. Deanna [name] is doing well in rehab. She began using the treadmill and did well at 1 mph with no incline. She also improved to level 4 on the T4 nustep and did well with 2 lb hand weights for resistance training. We will continue to monitor her progress in the program. Deanna [name] is doing well here at rehab, reports she is not doing much at home. Encouraged her to increase workload here at rehab as able. She says some days she is exhausted after exercise and affects her ability to do daily task. Deanna [name] continues to do well in the program. She was recently able to increase her laps on the track from 9 to 12. We will continue to monitor her progress in the program.   Expected Outcomes Short: Use RPE daily to regulate intensity. Long: Follow program prescription in THR. Short: Continue to follow current exercise prescription. Long: Continue exercise to improve strength and stamina. Short: Continue to progressively increase workloads. Long: Continue exercise to improve strength and stamina. STG: increase workload as able. LTG: Continue exercise to improve strength and stamina Short: Increase level on the Biostep to 2. Long: Continue exercise to improve strength and stamina.    Row Name 08/23/23 1159 09/02/23 0759 09/15/23 1431 09/27/23 1141 09/29/23 1434     Exercise Goal Re-Evaluation   Exercise Goals Review Increase Physical Activity;Able to understand and use rate of perceived exertion (RPE) scale;Knowledge and understanding of Target Heart Rate Range (THRR);Understanding of Exercise Prescription;Increase Strength and  Stamina;Able to understand and use Dyspnea scale;Able to check pulse independently Increase Physical Activity;Understanding of Exercise Prescription;Increase Strength and Stamina Increase Physical Activity;Understanding of Exercise Prescription;Increase Strength and Stamina Increase Physical Activity;Increase Strength and Stamina;Understanding of Exercise Prescription Increase Physical Activity;Increase Strength and Stamina;Understanding of Exercise Prescription   Comments Reviewed home exercise with pt today from 11:20 am to 11:32 am.  Pt plans to finish PT and  then start going to her community fitness center for exercise.  Reviewed THR, pulse, RPE, sign and symptoms, pulse oximetery and when to call 911 or MD.  Also discussed weather considerations and indoor options.  Pt voiced understanding. Deanna [name] is doing well in rehab. She increased to level 3 on the T4 nustep. She decreased to 5 laps from 12 laps on the track in this review. We will continue to monitor her progress in the program. Deanna [name] is doing well in rehab. She was able to maintain her intensity on the T4 nustep at level 3. She was also able to return to 12 laps on the track in 15 minutes. We will continue to monitor her progress in the program. Deanna [name] is doing well in rehab. She is walking the track and using the T4. Encouraged her to increase workload on T4 as able. She has been getting more laps as of late. She is not exercising much outside of rehab, she is walking some. SPoke with her about long term plan and looking to find a gym to do stepper exercise similar to the T4. Deanna [name] continues to do well in rehab. She increased her laps on the track as she most recently walked 13 laps. She also improved to level 2 on the T6 nustep. We will continue to monitor her progress in the program.   Expected Outcomes Short: add1-2 days a week of exercise at home on off days of rehab. Long: maintain independent exercise routine. Short: Push for more laps on the track  to get back to 12 laps. Long: Continue exercise to improve strength and stamina. Short: Increase to level 4 on the T4 nustep, increase to level 2 on the biostep. Long: Continue exercise to improve strength and stamina. STG: increase workload on T4, find a gym. LTG: Continue exercise to improve strength and stamina. Short: Continue to push for more laps on the track. Long: Continue exercise to improve strength and stamina.    Row Name 10/14/23 1353 10/28/23 0955           Exercise Goal Re-Evaluation   Exercise Goals Review Increase Physical Activity;Increase Strength and Stamina;Understanding of Exercise Prescription Increase Physical Activity;Increase Strength and Stamina;Understanding of Exercise Prescription      Comments Deanna [name] is doing well in rehab. She was recently able to increase from level 2 to 4 on the T4 nustep. She was also able to maintain her 15 laps on the track, and maintain level 2 on the T5 nustep. We will continue to monitor her progress in the program. Deanna [name] has not attended rehab since the last review. We recently called and she [place] on Monday, 11/01/2023. We will continue to monitor her progress when she returns to the program.      Expected Outcomes Short: Continue to push for more laps on the track. Long: Continue exercise to improve strength and stamina. Short: Return to rehab. Long: Continue exercise to improve strength and stamina.               Discharge Exercise Prescription (Final Exercise Prescription Changes):  Exercise Prescription Changes - 10/14/23 1300       Response to Exercise   Blood Pressure (Admit) 122/64    Blood Pressure (Exit) 140/64    Heart Rate (Admit) 72 bpm    Heart Rate (Exercise) 102 bpm    Heart Rate (Exit) 75 bpm    Oxygen Saturation (Admit) 90 %  Oxygen Saturation (Exercise) 90 %    Oxygen Saturation (Exit) 95 %    Rating of Perceived Exertion (Exercise) 15    Perceived Dyspnea  (Exercise) 3    Symptoms none    Duration Continue with 30 min of aerobic exercise without signs/symptoms of physical distress.    Intensity THRR unchanged      Progression   Progression Continue to progress workloads to maintain intensity without signs/symptoms of physical distress.    Average METs 2.05      Resistance Training   Training Prescription Yes    Weight 2 lb    Reps 10-15      Interval Training   Interval Training No      NuStep   Level 4    Minutes 15    METs 2.6      T5 Nustep   Level 2    Minutes 15    METs 1.9      Biostep-RELP   Level 1    Minutes 15    METs 2      Track   Laps 15    Minutes 15    METs 1.82      Home Exercise Plan   Plans to continue exercise at [employer] (comment)   fitness center in home community   Frequency Add 2 additional days to program exercise sessions.    Initial Home Exercises Provided 08/23/23      Oxygen   Maintain Oxygen Saturation 88% or higher             Nutrition:  Target Goals: Understanding of nutrition guidelines, daily intake of sodium 1500mg , cholesterol 200mg , calories 30% from fat and 7% or less from saturated fats, daily to have 5 or more servings of fruits and vegetables.  Education: All About Nutrition: -Group instruction provided by verbal, written material, interactive activities, discussions, models, and posters to present general guidelines for heart healthy nutrition including fat, fiber, MyPlate, the role of sodium in heart healthy nutrition, utilization of the nutrition label, and utilization of this knowledge for meal planning. Follow up email sent as well. Written material given at graduation.   Biometrics:  Pre Biometrics - 06/30/23 1622       Pre Biometrics   Height 5' 3.5" (1.613 m)    Weight 165 lb 12.8 oz (75.2 kg)    Waist Circumference 39.5 inches    Hip Circumference 44.9 inches    Waist to Hip Ratio 0.88 %    BMI (Calculated) 28.91    Single Leg Stand 6.9  seconds              Nutrition Therapy Plan and Nutrition Goals:  Nutrition Therapy & Goals - 06/30/23 1605       Nutrition Therapy   Diet Carb controlled, cardiac, low Na    Protein (specify units) 75g    Fiber 25 grams    Whole Grain Foods 3 servings    Saturated Fats 15 max. grams    Fruits and Vegetables 5 servings/day    Sodium 2 grams      Personal Nutrition Goals   Nutrition Goal Eat 15-30gProtein and 30-60gCarbs at each meal.    Personal Goal #2 Practice mindful snacking and limit sugary treats to a few times per week    Personal Goal #3 Read labels and reduce sodium intake to below 2300mg . Ideally 1500mg  per day.    Comments Patient drinking ~60oz of water  daily. She usually eats 3 times per  day, likes fruits and veggies. Doesn't eat much animal meat, but does like fish and some chicken. She struggles with snacking and sugary foods. Her A1C is ~6.0%. She is aware of carb choices and portions. Reviewed mediterranean diet handout, educated on types of fats, sources, and how to read label. Provided sample meal plates handout and build out meals and snacks with foods she likes and will eat, focusing on adequate protein while controlling complex carb portions and including lots of colorful produce.      Intervention Plan   Intervention Prescribe, educate and counsel regarding individualized specific dietary modifications aiming towards targeted core components such as weight, hypertension, lipid management, diabetes, heart failure and other comorbidities.;Nutrition handout(s) given to patient.    Expected Outcomes Short Term Goal: Understand basic principles of dietary content, such as calories, fat, sodium, cholesterol and nutrients.;Short Term Goal: A plan has been developed with personal nutrition goals set during dietitian appointment.;Long Term Goal: Adherence to prescribed nutrition plan.             Nutrition Assessments:  MEDIFICTS Score Key: >=70 Need to make  dietary changes  40-70 Heart Healthy Diet <= 40 Therapeutic Level Cholesterol Diet  Flowsheet Row Pulmonary Rehab from 07/07/2023 in [facility]  Picture Your Plate Total Score on Admission 72      Picture Your Plate Scores: <13 Unhealthy dietary pattern with much room for improvement. 41-50 Dietary pattern unlikely to meet recommendations for good health and room for improvement. 51-60 More healthful dietary pattern, with some room for improvement.  >60 Healthy dietary pattern, although there may be some specific behaviors that could be improved.   Nutrition Goals Re-Evaluation:  Nutrition Goals Re-Evaluation     Row Name 08/09/23 1128 09/01/23 1117 09/27/23 1147         Goals   Comment Spoke with her about healthy eating, including lots of colorful produce and quality protein. Controlling carb choices to pick complex carbs over refined, processed and concentrated sugary products. Patient was informed on why it is important to maintain a balanced diet when dealing with Respiratory issues. Explained that it takes a lot of energy to breath and when they are short of breath often they will need to have a good diet to help keep up with the calories they are expending for breathing. Deanna [name] feels she is doing well on nutrition. Says she is eating smaller portions but feels she needs to do better with adding protien. Set goal to increase protein intake. RD reviewed and brainstormed some ideas to include more protein into her meals and snacks.     Expected Outcome STG: meet protein goal of >70g. LTG: follow a heart healthy diet and lifestyle plan Short: Choose and plan snacks accordingly to patients caloric intake to improve breathing. Long: Maintain a diet independently that meets their caloric intake to aid in daily shortness of breath. STG: increase proten intake, include protien at most meals and snacks. LTG: Maintain a diet independently that meets their caloric intake to aid in  daily shortness of breath.              Nutrition Goals Discharge (Final Nutrition Goals Re-Evaluation):  Nutrition Goals Re-Evaluation - 09/27/23 1147       Goals   Comment Deanna [name] feels she is doing well on nutrition. Says she is eating smaller portions but feels she needs to do better with adding protien. Set goal to increase protein intake. RD reviewed and brainstormed some ideas to  include more protein into her meals and snacks.    Expected Outcome STG: increase proten intake, include protien at most meals and snacks. LTG: Maintain a diet independently that meets their caloric intake to aid in daily shortness of breath.             Psychosocial: Target Goals: Acknowledge presence or absence of significant depression and/or stress, maximize coping skills, provide positive support system. Participant is able to verbalize types and ability to use techniques and skills needed for reducing stress and depression.   Education: Stress, Anxiety, and Depression - Group verbal and visual presentation to define topics covered.  Reviews how body is impacted by stress, anxiety, and depression.  Also discusses healthy ways to reduce stress and to treat/manage anxiety and depression.  Written material given at graduation.   Education: Sleep Hygiene -Provides group verbal and written instruction about how sleep can affect your health.  Define sleep hygiene, discuss sleep cycles and impact of sleep habits. Review good sleep hygiene tips.    Initial Review & Psychosocial Screening:  Initial Psych Review & Screening - 06/28/23 1028       Initial Review   Current issues with Current Sleep Concerns;Current Stress Concerns    Source of Stress Concerns Chronic Illness      Family Dynamics   Good Support System? Yes    Comments Aine lives by herself and can call her daughterif she needs support. Sometimes she has problems sleeping due to her shortness of breath. She states no depression and does  not take anything for her mood.      Barriers   Psychosocial barriers to participate in program The patient should benefit from training in stress management and relaxation.      Screening Interventions   Interventions Encouraged to exercise;To provide support and resources with identified psychosocial needs;Provide feedback about the scores to participant    Expected Outcomes Short Term goal: Utilizing psychosocial counselor, staff and physician to assist with identification of specific Stressors or current issues interfering with healing process. Setting desired goal for each stressor or current issue identified.;Long Term Goal: Stressors or current issues are controlled or eliminated.;Short Term goal: Identification and review with participant of any Quality of Life or Depression concerns found by scoring the questionnaire.;Long Term goal: The participant improves quality of Life and PHQ9 Scores as seen by post scores and/or verbalization of changes             Quality of Life Scores:  Scores of 19 and below usually indicate a poorer quality of life in these areas.  A difference of  2-3 points is a clinically meaningful difference.  A difference of 2-3 points in the total score of the Quality of Life Index has been associated with significant improvement in overall quality of life, self-image, physical symptoms, and general health in studies assessing change in quality of life.  PHQ-9: Review Flowsheet  More data exists      09/01/2023 06/30/2023 01/14/2022 12/26/2020 12/21/2019  Depression screen PHQ 2/9  Decreased Interest 1 1 0 0 0  Down, Depressed, Hopeless 0 0 0 0 0  PHQ - 2 Score 1 1 0 0 0  Altered sleeping 1 2 - - -  Tired, decreased energy 1 2 - - -  Change in appetite 0 0 - - -  Feeling bad or failure about yourself  0 0 - - -  Trouble concentrating 0 1 - - -  Moving slowly or fidgety/restless 0 0 - - -  Suicidal thoughts 0 0 - - -  PHQ-9 Score 3 6 - - -  Difficult doing  work/chores Somewhat difficult Very difficult - - -   Interpretation of Total Score  Total Score Depression Severity:  1-4 = Minimal depression, 5-9 = Mild depression, 10-14 = Moderate depression, 15-19 = Moderately severe depression, 20-27 = Severe depression   Psychosocial Evaluation and Intervention:  Psychosocial Evaluation - 06/28/23 1030       Psychosocial Evaluation & Interventions   Interventions Relaxation education;Stress management education;Encouraged to exercise with the program and follow exercise prescription    Comments Marlaine lives by herself and can call her daughterif she needs support. Sometimes she has problems sleeping due to her shortness of breath. She states no depression and does not take anything for her mood.    Expected Outcomes Short: Attend [facility] stress management education to decrease stress. Long: Maintain exercise Post [facility] to keep stress at a minimum.    Continue Psychosocial Services  Follow up required by staff             Psychosocial Re-Evaluation:  Psychosocial Re-Evaluation     Row Name 08/09/23 1125 09/01/23 1126 09/27/23 1144         Psychosocial Re-Evaluation   Current issues with Current Sleep Concerns Current Stress Concerns Current Stress Concerns     Comments Deanna [name] states she isnt not experiencing any anxiety depression or stress currently. Says she has a good support system of friends that have similar health issues. She says she has always struggled with sleep, some days she sleeps well others not well at all. She does take naps during the day. Days she exercises, she often takes a nap, but then struggles to sleep later in the night. Reviewed patient health questionnaire (PHQ-9) with patient for follow up. Previously, patients score indicated signs/symptoms of depression.  Reviewed to see if patient is improving symptom wise while in program.  Score improved and patient states that it is because she has been able to exercise  more. Deanna [name] reports she is doing well with mental health as of late. Says coming to rehab and exercising has helped her relief stress. Says she is sleeping well. She reports she has a good support system.     Expected Outcomes STG: continue to focus on getting good rest from naps and sleep. LTG: maintain a positive outlook on health and daily life Short: Continue to attend [facility] regularly for regular exercise and social engagement. Long: Continue to improve symptoms and manage a positive mental state. STG: focus on good sleep hygiene, attend rehab for exercise and stress relief. LTG: Continue to improve symptoms and manage a positive mental state.     Interventions Encouraged to attend Pulmonary Rehabilitation for the exercise Encouraged to attend Pulmonary Rehabilitation for the exercise Encouraged to attend Pulmonary Rehabilitation for the exercise     Continue Psychosocial Services  Follow up required by staff Follow up required by staff Follow up required by staff       Initial Review   Source of Stress Concerns -- -- Chronic Illness              Psychosocial Discharge (Final Psychosocial Re-Evaluation):  Psychosocial Re-Evaluation - 09/27/23 1144       Psychosocial Re-Evaluation   Current issues with Current Stress Concerns    Comments Deanna [name] reports she is doing well with mental health as of late. Says coming to rehab and exercising has helped her relief stress. Says she is  sleeping well. She reports she has a good support system.    Expected Outcomes STG: focus on good sleep hygiene, attend rehab for exercise and stress relief. LTG: Continue to improve symptoms and manage a positive mental state.    Interventions Encouraged to attend Pulmonary Rehabilitation for the exercise    Continue Psychosocial Services  Follow up required by staff      Initial Review   Source of Stress Concerns Chronic Illness             Education: Education Goals: Education classes will be  provided on a weekly basis, covering required topics. Participant will state understanding/return demonstration of topics presented.  Learning Barriers/Preferences:  Learning Barriers/Preferences - 06/28/23 1027       Learning Barriers/Preferences   Learning Barriers None    Learning Preferences None             General Pulmonary Education Topics:  Infection Prevention: - Provides verbal and written material to individual with discussion of infection control including proper hand washing and proper equipment cleaning during exercise session. Flowsheet Row Pulmonary Rehab from 06/30/2023 in [facility] Cardiac and Pulmonary Rehab  Date 06/30/23  Educator MB  Instruction Review Code 1- Verbalizes Understanding       Falls Prevention: - Provides verbal and written material to individual with discussion of falls prevention and safety. Flowsheet Row Pulmonary Rehab from 06/30/2023 in [facility] Cardiac and Pulmonary Rehab  Date 06/30/23  Educator MB  Instruction Review Code 1- Verbalizes Understanding       Chronic Lung Disease Review: - Group verbal instruction with posters, models, PowerPoint presentations and videos,  to review new updates, new respiratory medications, new advancements in procedures and treatments. Providing information on websites and "800" numbers for continued self-education. Includes information about supplement oxygen, available portable oxygen systems, continuous and intermittent flow rates, oxygen safety, concentrators, and Medicare reimbursement for oxygen. Explanation of Pulmonary Drugs, including class, frequency, complications, importance of spacers, rinsing mouth after steroid MDI's, and proper cleaning methods for nebulizers. Review of basic lung anatomy and physiology related to function, structure, and complications of lung disease. Review of risk factors. Discussion about methods for diagnosing sleep apnea and types of masks and machines for OSA. Includes a  review of the use of types of environmental controls: home humidity, furnaces, filters, dust mite/pet prevention, HEPA vacuums. Discussion about weather changes, air quality and the benefits of nasal washing. Instruction on Warning signs, infection symptoms, calling MD promptly, preventive modes, and value of vaccinations. Review of effective airway clearance, coughing and/or vibration techniques. Emphasizing that all should Create an Action Plan. Written material given at graduation.   AED/CPR: - Group verbal and written instruction with the use of models to demonstrate the basic use of the AED with the basic ABC's of resuscitation.    Anatomy and Cardiac Procedures: - Group verbal and visual presentation and models provide information about basic cardiac anatomy and function. Reviews the testing methods done to diagnose heart disease and the outcomes of the test results. Describes the treatment choices: Medical Management, Angioplasty, or Coronary Bypass Surgery for treating various heart conditions including Myocardial Infarction, Angina, Valve Disease, and Cardiac Arrhythmias.  Written material given at graduation.   Medication Safety: - Group verbal and visual instruction to review commonly prescribed medications for heart and lung disease. Reviews the medication, class of the drug, and side effects. Includes the steps to properly store meds and maintain the prescription regimen.  Written material given at graduation.  Other: -Provides group and verbal instruction on various topics (see comments)   Knowledge Questionnaire Score:  Knowledge Questionnaire Score - 07/07/23 1349       Knowledge Questionnaire Score   Pre Score 14/18              Core Components/Risk Factors/Patient Goals at Admission:  Personal Goals and Risk Factors at Admission - 06/30/23 1622       Core Components/Risk Factors/Patient Goals on Admission    Weight Management Yes;Weight Loss    Intervention  Weight Management: Develop a combined nutrition and exercise program designed to reach desired caloric intake, while maintaining appropriate intake of nutrient and fiber, sodium and fats, and appropriate energy expenditure required for the weight goal.;Weight Management: Provide education and appropriate resources to help participant work on and attain dietary goals.;Weight Management/Obesity: Establish reasonable short term and long term weight goals.;Obesity: Provide education and appropriate resources to help participant work on and attain dietary goals.    Admit Weight 165 lb 12.8 oz (75.2 kg)    Goal Weight: Short Term 158 lb 12.8 oz (72 kg)    Goal Weight: Long Term 151 lb 12.8 oz (68.9 kg)    Expected Outcomes Short Term: Continue to assess and modify interventions until short term weight is achieved;Long Term: Adherence to nutrition and physical activity/exercise program aimed toward attainment of established weight goal;Weight Loss: Understanding of general recommendations for a balanced deficit meal plan, which promotes 1-2 lb weight loss per week and includes a negative energy balance of [phone] kcal/d;Understanding recommendations for meals to include 15-35% energy as protein, 25-35% energy from fat, 35-60% energy from carbohydrates, less than 200mg  of dietary cholesterol, 20-35 gm of total fiber daily;Understanding of distribution of calorie intake throughout the day with the consumption of 4-5 meals/snacks    Improve shortness of breath with ADL's Yes    Intervention Provide education, individualized exercise plan and daily activity instruction to help decrease symptoms of SOB with activities of daily living.    Expected Outcomes Short Term: Improve cardiorespiratory fitness to achieve a reduction of symptoms when performing ADLs;Long Term: Be able to perform more ADLs without symptoms or delay the onset of symptoms    Hypertension Yes    Intervention Provide education on lifestyle  modifcations including regular physical activity/exercise, weight management, moderate sodium restriction and increased consumption of fresh fruit, vegetables, and low fat dairy, alcohol  moderation, and smoking cessation.;Monitor prescription use compliance.    Expected Outcomes Short Term: Continued assessment and intervention until BP is < 140/13mm HG in hypertensive participants. < 130/77mm HG in hypertensive participants with diabetes, heart failure or chronic kidney disease.;Long Term: Maintenance of blood pressure at goal levels.    Lipids Yes    Intervention Provide education and support for participant on nutrition & aerobic/resistive exercise along with prescribed medications to achieve LDL 70mg , HDL >40mg .    Expected Outcomes Short Term: Participant states understanding of desired cholesterol values and is compliant with medications prescribed. Participant is following exercise prescription and nutrition guidelines.;Long Term: Cholesterol controlled with medications as prescribed, with individualized exercise RX and with personalized nutrition plan. Value goals: LDL < 70mg , HDL > 40 mg.             Education:Diabetes - Individual verbal and written instruction to review signs/symptoms of diabetes, desired ranges of glucose level fasting, after meals and with exercise. Acknowledge that pre and post exercise glucose checks will be done for 3 sessions at entry of program.   Know Your  Numbers and Heart Failure: - Group verbal and visual instruction to discuss disease risk factors for cardiac and pulmonary disease and treatment options.  Reviews associated critical values for Overweight/Obesity, Hypertension, Cholesterol, and Diabetes.  Discusses basics of heart failure: signs/symptoms and treatments.  Introduces Heart Failure Zone chart for action plan for heart failure.  Written material given at graduation.   Core Components/Risk Factors/Patient Goals Review:   Goals and Risk Factor  Review     Row Name 08/09/23 1130 09/01/23 1116 09/27/23 1149         Core Components/Risk Factors/Patient Goals Review   Personal Goals Review Weight Management/Obesity Improve shortness of breath with ADL's Improve shortness of breath with ADL's     Review Deanna [name] gained weight when being ill, she is now looking to lose some of that gained weight. She is more active than before but still unable to lose the weight. Encouraged her to increase workload as able and eat low caloire nutrient dense foods often. The weight loss might take some time as her physcial exercise is limited due to shortness of breath. Spoke to patient about their shortness of breath and what they can do to improve. Patient has been informed of breathing techniques when starting the program. Patient is informed to tell staff if they have had any med changes and that certain meds they are taking or not taking can be causing shortness of breath. Deanna [name] reports her shortness of breath during ADLs has improved slightly and credits that improvement on the exercise done here. Encouraged her continue to exercise and prevent her shortness of breath from getting worse     Expected Outcomes STG: lose 5% of body weight. LTG: acheve and maintain healthy weight Short: Attend [facility] regularly to improve shortness of breath with ADL's. Long: maintain independence with ADL's STG: Attend rehab and make exercise plan for post rehab. LTG: maintain independence with ADL's              Core Components/Risk Factors/Patient Goals at Discharge (Final Review):   Goals and Risk Factor Review - 09/27/23 1149       Core Components/Risk Factors/Patient Goals Review   Personal Goals Review Improve shortness of breath with ADL's    Review Deanna [name] reports her shortness of breath during ADLs has improved slightly and credits that improvement on the exercise done here. Encouraged her continue to exercise and prevent her shortness of breath from getting worse     Expected Outcomes STG: Attend rehab and make exercise plan for post rehab. LTG: maintain independence with ADL's             ITP Comments:  ITP Comments     Row Name 06/28/23 1025 06/30/23 1614 07/07/23 1138 07/14/23 0812 08/11/23 0819   ITP Comments Virtual Visit completed. Patient informed on EP and RD appointment and 6 Minute walk test. Patient also informed of patient health questionnaires on My Chart. Patient Verbalizes understanding. Visit diagnosis can be found in [facility] 06/09/2023. Completed and gym orientation. Initial ITP created and sent for review to Dr. Faud Aleskerov, Medical Director. First full day of exercise!  Patient was oriented to gym and equipment including functions, settings, policies, and procedures.  Patient's individual exercise prescription and treatment plan were reviewed.  All starting workloads were established based on the results of the 6 minute walk test done at initial orientation visit.  The plan for exercise progression was also introduced and progression will be customized based on patient's performance and goals. 30  Day review completed. Medical Director ITP review done, changes made as directed, and signed approval by Medical Director. Newer to Program 30 Day review completed. Medical Director ITP review done, changes made as directed, and signed approval by Medical Director.    Row Name 09/08/23 0941 10/06/23 0856 11/03/23 1032       ITP Comments 30 Day review completed. Medical Director ITP review done, changes made as directed, and signed approval by Medical Director. 30 Day review completed. Medical Director ITP review done, changes made as directed, and signed approval by Medical Director. 30 Day review completed. Medical Director ITP review done, changes made as directed, and signed approval by Medical Director.              Comments:

## 2023-11-05 DIAGNOSIS — R0602 Shortness of breath: Secondary | ICD-10-CM | POA: Diagnosis not present

## 2023-11-05 NOTE — Progress Notes (Signed)
 Daily Session Note  Patient Details  Name: Deanna [name] MRN: [id number] Date of Birth: 19-Jan-1946 Referring Provider:   Flowsheet Row Pulmonary Rehab from 06/30/2023 in [facility] Cardiac and Pulmonary Rehab  Referring Provider Annitta Kindler, MD       Encounter Date: 11/05/2023  Check In:  Session Check In - 11/05/23 1121       Check-In   Supervising physician immediately available to respond to emergencies See telemetry face sheet for immediately available ER MD    Location [facility]-Cardiac & Pulmonary Rehab    Staff Present Sherle Dire, BS, Exercise Physiologist;Clydine Parkison Sabra Cramp BS, ACSM CEP, Exercise Physiologist;Amadi Frady RN,BSN,MPA;Maxon Conetta BS, Exercise Physiologist    Virtual Visit No    Medication changes reported     No    Fall or balance concerns reported    No    Tobacco Cessation No Change    Warm-up and Cool-down Performed on first and last piece of equipment    Resistance Training Performed Yes    VAD Patient? No    PAD/SET Patient? No      Pain Assessment   Currently in Pain? No/denies                Social History   Tobacco Use  Smoking Status Former   Current packs/day: 0.00   Average packs/day: 0.5 packs/day for 30.0 years (15.0 ttl pk-yrs)   Types: Cigarettes   Start date: 01/01/1968   Quit date: 12/31/1997   Years since quitting: 25.8  Smokeless Tobacco Never  Tobacco Comments   Quit in 1999    Goals Met:  Independence with exercise equipment Exercise tolerated well No report of concerns or symptoms today Strength training completed today  Goals Unmet:  Not Applicable  Comments: Pt able to follow exercise prescription today without complaint.  Will continue to monitor for progression.    Dr. Firman Hughes is Medical Director for [facility] Cardiac Rehabilitation.  Dr. Fuad Aleskerov is Medical Director for [facility] Pulmonary Rehabilitation.

## 2023-11-08 ENCOUNTER — Encounter: Payer: MEDICARE | Admitting: *Deleted

## 2023-11-08 DIAGNOSIS — R0602 Shortness of breath: Secondary | ICD-10-CM | POA: Diagnosis not present

## 2023-11-08 NOTE — Progress Notes (Signed)
 Daily Session Note  Patient Details  Name: Adalynd Donahoe [place] MRN: [id number] Date of Birth: 11/02/1945 Referring Provider:   Flowsheet Row Pulmonary Rehab from 06/30/2023 in [facility] Cardiac and Pulmonary Rehab  Referring Provider Annitta Kindler, MD       Encounter Date: 11/08/2023  Check In:  Session Check In - 11/08/23 1137       Check-In   Supervising physician immediately available to respond to emergencies See telemetry face sheet for immediately available ER MD    Location [facility]-Cardiac & Pulmonary Rehab    Staff Present Maud Sorenson, RN, BSN, CCRP;Meredith Manson [place] RN,BSN;Maxon [place] BS, Exercise Physiologist;Kelly Bollinger [facility]    Virtual Visit No    Medication changes reported     No    Fall or balance concerns reported    No    Warm-up and Cool-down Performed on first and last piece of equipment    Resistance Training Performed Yes    VAD Patient? No    PAD/SET Patient? No      Pain Assessment   Currently in Pain? No/denies                Social History   Tobacco Use  Smoking Status Former   Current packs/day: 0.00   Average packs/day: 0.5 packs/day for 30.0 years (15.0 ttl pk-yrs)   Types: Cigarettes   Start date: 01/01/1968   Quit date: 12/31/1997   Years since quitting: 25.8  Smokeless Tobacco Never  Tobacco Comments   Quit in 1999    Goals Met:  Proper associated with RPD/PD & O2 Sat Independence with exercise equipment Exercise tolerated well No report of concerns or symptoms today  Goals Unmet:  Not Applicable  Comments: Pt able to follow exercise prescription today without complaint.  Will continue to monitor for progression.    Dr. Firman Hughes is Medical Director for [facility] Cardiac Rehabilitation.  Dr. Fuad Aleskerov is Medical Director for [facility] Pulmonary Rehabilitation.

## 2023-11-10 ENCOUNTER — Encounter: Payer: MEDICARE | Admitting: *Deleted

## 2023-11-10 DIAGNOSIS — R0602 Shortness of breath: Secondary | ICD-10-CM

## 2023-11-10 NOTE — Progress Notes (Signed)
 Daily Session Note  Patient Details  Name: Deanna [name] MRN: [id number] Date of Birth: 1945-12-18 Referring Provider:   Flowsheet Row Pulmonary Rehab from 06/30/2023 in [facility] Cardiac and Pulmonary Rehab  Referring Provider Annitta Kindler, MD       Encounter Date: 11/10/2023  Check In:  Session Check In - 11/10/23 1404       Check-In   Supervising physician immediately available to respond to emergencies See telemetry face sheet for immediately available ER MD    Location [facility]-Cardiac & Pulmonary Rehab    Staff Present Sue Em RN,BSN;Susanne Bice, RN, BSN, CCRP;Jason Martina Sledge RDN,LDN;Kelly Bollinger [facility]    Virtual Visit No    Medication changes reported     No    Fall or balance concerns reported    No    Warm-up and Cool-down Performed on first and last piece of equipment    Resistance Training Performed Yes    VAD Patient? No    PAD/SET Patient? No      Pain Assessment   Currently in Pain? No/denies                Social History   Tobacco Use  Smoking Status Former   Current packs/day: 0.00   Average packs/day: 0.5 packs/day for 30.0 years (15.0 ttl pk-yrs)   Types: Cigarettes   Start date: 01/01/1968   Quit date: 12/31/1997   Years since quitting: 25.8  Smokeless Tobacco Never  Tobacco Comments   Quit in 1999    Goals Met:  Independence with exercise equipment Exercise tolerated well No report of concerns or symptoms today Strength training completed today  Goals Unmet:  Not Applicable  Comments: Pt able to follow exercise prescription today without complaint.  Will continue to monitor for progression.    Dr. Firman Hughes is Medical Director for [facility] Cardiac Rehabilitation.  Dr. Fuad Aleskerov is Medical Director for [facility] Pulmonary Rehabilitation.

## 2023-11-17 ENCOUNTER — Encounter: Payer: MEDICARE | Admitting: *Deleted

## 2023-11-17 DIAGNOSIS — R0602 Shortness of breath: Secondary | ICD-10-CM | POA: Diagnosis not present

## 2023-11-17 NOTE — Progress Notes (Signed)
 Daily Session Note  Patient Details  Name: Deanna [name] MRN: [id number] Date of Birth: 21-Feb-1946 Referring Provider:   Flowsheet Row Pulmonary Rehab from 06/30/2023 in [facility] Cardiac and Pulmonary Rehab  Referring Provider Annitta Kindler, MD    Encounter Date: 11/17/2023  Check In:  Session Check In - 11/17/23 1137       Check-In   Supervising physician immediately available to respond to emergencies See telemetry face sheet for immediately available ER MD    Location [facility]-Cardiac & Pulmonary Rehab    Staff Present Maud Sorenson, RN, BSN, CCRP;Joseph Hood RCP,RRT,BSRT;Maxon [place] BS, Exercise Physiologist;Jason Martina Sledge RDN,LDN    Virtual Visit No    Medication changes reported     No    Fall or balance concerns reported    No    Warm-up and Cool-down Performed on first and last piece of equipment    Resistance Training Performed Yes    VAD Patient? No    PAD/SET Patient? No      Pain Assessment   Currently in Pain? No/denies             Social History   Tobacco Use  Smoking Status Former   Current packs/day: 0.00   Average packs/day: 0.5 packs/day for 30.0 years (15.0 ttl pk-yrs)   Types: Cigarettes   Start date: 01/01/1968   Quit date: 12/31/1997   Years since quitting: 25.8  Smokeless Tobacco Never  Tobacco Comments   Quit in 1999    Goals Met:  Proper associated with RPD/PD & O2 Sat Independence with exercise equipment Exercise tolerated well No report of concerns or symptoms today  Goals Unmet:  Not Applicable  Comments: Pt able to follow exercise prescription today without complaint.  Will continue to monitor for progression.    Dr. Firman Hughes is Medical Director for [facility] Cardiac Rehabilitation.  Dr. Fuad Aleskerov is Medical Director for [facility] Pulmonary Rehabilitation.

## 2023-11-19 ENCOUNTER — Encounter: Payer: MEDICARE | Admitting: *Deleted

## 2023-11-19 DIAGNOSIS — R0602 Shortness of breath: Secondary | ICD-10-CM

## 2023-11-19 NOTE — Progress Notes (Signed)
 Daily Session Note  Patient Details  Name: Deanna [name] MRN: [id number] Date of Birth: 09/06/1945 Referring Provider:   Flowsheet Row Pulmonary Rehab from 06/30/2023 in [facility] Cardiac and Pulmonary Rehab  Referring Provider Annitta Kindler, MD    Encounter Date: 11/19/2023  Check In:  Session Check In - 11/19/23 1140       Check-In   Supervising physician immediately available to respond to emergencies See telemetry face sheet for immediately available ER MD    Location [facility]-Cardiac & Pulmonary Rehab    Staff Present Maud Sorenson, RN, BSN, CCRP;Joseph Hood RCP,RRT,BSRT;Maxon [place] BS, Exercise Physiologist;Noah Tickle, BS, Exercise Physiologist    Virtual Visit No    Medication changes reported     No    Fall or balance concerns reported    No    Warm-up and Cool-down Performed on first and last piece of equipment    Resistance Training Performed Yes    VAD Patient? No    PAD/SET Patient? No      Pain Assessment   Currently in Pain? No/denies             Social History   Tobacco Use  Smoking Status Former   Current packs/day: 0.00   Average packs/day: 0.5 packs/day for 30.0 years (15.0 ttl pk-yrs)   Types: Cigarettes   Start date: 01/01/1968   Quit date: 12/31/1997   Years since quitting: 25.9  Smokeless Tobacco Never  Tobacco Comments   Quit in 1999    Goals Met:  Proper associated with RPD/PD & O2 Sat Independence with exercise equipment Exercise tolerated well No report of concerns or symptoms today  Goals Unmet:  Not Applicable  Comments: Pt able to follow exercise prescription today without complaint.  Will continue to monitor for progression.    Dr. Firman Hughes is Medical Director for [facility] Cardiac Rehabilitation.  Dr. Fuad Aleskerov is Medical Director for [facility] Pulmonary Rehabilitation.

## 2023-11-22 ENCOUNTER — Encounter: Payer: MEDICARE | Admitting: *Deleted

## 2023-11-22 VITALS — Ht 63.5 in | Wt 166.3 lb

## 2023-11-22 DIAGNOSIS — R0602 Shortness of breath: Secondary | ICD-10-CM

## 2023-11-22 NOTE — Patient Instructions (Signed)
 Discharge Patient Instructions  Patient Details  Name: Deanna [name] MRN: [id number] Date of Birth: April 22, 1946 Referring Provider:  Delores Rojelio Caldron, NP   Number of Visits: 33  Reason for Discharge:  Patient reached a stable level of exercise. Patient independent in their exercise. Patient has met program and personal goals.  Smoking History:  Social History   Tobacco Use  Smoking Status Former   Current packs/day: 0.00   Average packs/day: 0.5 packs/day for 30.0 years (15.0 ttl pk-yrs)   Types: Cigarettes   Start date: 01/01/1968   Quit date: 12/31/1997   Years since quitting: 25.9  Smokeless Tobacco Never  Tobacco Comments   Quit in 1999    Diagnosis:  SOB (shortness of breath)  Initial Exercise Prescription:  Initial Exercise Prescription - 06/30/23 1600       Date of Initial Exercise RX and Referring Provider   Date 06/30/23    Referring Provider Assaker, Darrin, MD      Oxygen   Maintain Oxygen Saturation 88% or higher      NuStep   Level 1    SPM 80    Minutes 15    METs 1.38      Arm Ergometer   Level 1    RPM 30    Minutes 15    METs 1.38      Biostep-RELP   Level 1    SPM 50    Minutes 15    METs 1.38      Track   Laps 9    Minutes 15    METs 1.49      Prescription Details   Frequency (times per week) 2    Duration Progress to 30 minutes of continuous aerobic without signs/symptoms of physical distress      Intensity   THRR 40-80% of Max Heartrate 101-129    Ratings of Perceived Exertion 11-13    Perceived Dyspnea 0-4      Progression   Progression Continue to progress workloads to maintain intensity without signs/symptoms of physical distress.      Resistance Training   Training Prescription Yes    Weight 2 lb upper, 4 lb lower    Reps 10-15          Discharge Exercise Prescription (Final Exercise Prescription Changes):  Exercise Prescription Changes - 11/10/23 1300       Response to Exercise   Blood Pressure  (Admit) 140/70    Blood Pressure (Exit) 128/80    Heart Rate (Admit) 100 bpm    Heart Rate (Exercise) 106 bpm    Heart Rate (Exit) 107 bpm    Oxygen Saturation (Admit) 96 %    Oxygen Saturation (Exercise) 92 %    Oxygen Saturation (Exit) 95 %    Rating of Perceived Exertion (Exercise) 13    Symptoms none    Duration Continue with 30 min of aerobic exercise without signs/symptoms of physical distress.    Intensity THRR unchanged      Progression   Progression Continue to progress workloads to maintain intensity without signs/symptoms of physical distress.    Average METs 1.6      Resistance Training   Training Prescription Yes    Weight 2 lb    Reps 10-15      Interval Training   Interval Training No      Biostep-RELP   Level 1    Minutes 15      Track   Laps 11    Minutes  15    METs 1.6      Home Exercise Plan   Plans to continue exercise at [facility] (comment)   fitness center in home community   Frequency Add 2 additional days to program exercise sessions.    Initial Home Exercises Provided 08/23/23      Oxygen   Maintain Oxygen Saturation 88% or higher          Functional Capacity:  6 Minute Walk     Row Name 06/30/23 1615 11/22/23 1139       6 Minute Walk   Phase Initial Discharge    Distance 600 feet 945 feet    Distance % Change -- 58 %    Distance Feet Change -- 345 ft    Walk Time 5.5 minutes 6 minutes    # of Rest Breaks 1 0    MPH 1.24 1.79    METS 1.38 2.34    RPE 15 14    Perceived Dyspnea  3 2    VO2 Peak 4.83 8.19    Symptoms Yes (comment) No    Comments SOB --    Resting HR 73 bpm 93 bpm    Resting BP 140/60 168/72    Resting Oxygen Saturation  92 % 93 %    Exercise Oxygen Saturation  during 6 min walk 92 % 93 %    Max Ex. HR 96 bpm 125 bpm    Max Ex. BP 160/66 162/80    2 Minute Post BP 138/70 122/80      Interval HR   1 Minute HR 86 113    2 Minute HR 91 120    3 Minute HR 93 122    4 Minute HR 93 122    5 Minute  HR 96 123    6 Minute HR 93 125    2 Minute Post HR 77 92    Interval Heart Rate? Yes Yes      Interval Oxygen   Interval Oxygen? Yes Yes    Baseline Oxygen Saturation % 92 % 93 %    1 Minute Oxygen Saturation % 93 % 96 %    1 Minute Liters of Oxygen 0 L 0 L    2 Minute Oxygen Saturation % 92 % 95 %    2 Minute Liters of Oxygen 0 L 0 L    3 Minute Oxygen Saturation % 93 % 94 %    3 Minute Liters of Oxygen 0 L 0 L    4 Minute Oxygen Saturation % 93 % 94 %    4 Minute Liters of Oxygen 0 L 0 L    5 Minute Oxygen Saturation % 92 % 93 %    5 Minute Liters of Oxygen 0 L 0 L    6 Minute Oxygen Saturation % 92 % 93 %    6 Minute Liters of Oxygen 0 L 0 L    2 Minute Post Oxygen Saturation % 94 % 97 %    2 Minute Post Liters of Oxygen 0 L 0 L       Nutrition & Weight - Outcomes:  Pre Biometrics - 06/30/23 1622       Pre Biometrics   Height 5' 3.5 (1.613 m)    Weight 165 lb 12.8 oz (75.2 kg)    Waist Circumference 39.5 inches    Hip Circumference 44.9 inches    Waist to Hip Ratio 0.88 %    BMI (Calculated) 28.91  Single Leg Stand 6.9 seconds          Post Biometrics - 11/22/23 1212        Post  Biometrics   Height 5' 3.5 (1.613 m)    Weight 166 lb 4.8 oz (75.4 kg)    Waist Circumference 39 inches    Hip Circumference 46.5 inches    Waist to Hip Ratio 0.84 %    BMI (Calculated) 28.99    Single Leg Stand 6.81 seconds          Nutrition:  Nutrition Therapy & Goals - 06/30/23 1605       Nutrition Therapy   Diet Carb controlled, cardiac, low Na    Protein (specify units) 75g    Fiber 25 grams    Whole Grain Foods 3 servings    Saturated Fats 15 max. grams    Fruits and Vegetables 5 servings/day    Sodium 2 grams      Personal Nutrition Goals   Nutrition Goal Eat 15-30gProtein and 30-60gCarbs at each meal.    Personal Goal #2 Practice mindful snacking and limit sugary treats to a few times per week    Personal Goal #3 Read labels and reduce sodium intake to  below 2300mg . Ideally 1500mg  per day.    Comments Patient drinking ~60oz of water  daily. She usually eats 3 times per day, likes fruits and veggies. Doesn't eat much animal meat, but does like fish and some chicken. She struggles with snacking and sugary foods. Her A1C is ~6.0%. She is aware of carb choices and portions. Reviewed mediterranean diet handout, educated on types of fats, sources, and how to read label. Provided sample meal plates handout and build out meals and snacks with foods she likes and will eat, focusing on adequate protein while controlling complex carb portions and including lots of colorful produce.      Intervention Plan   Intervention Prescribe, educate and counsel regarding individualized specific dietary modifications aiming towards targeted core components such as weight, hypertension, lipid management, diabetes, heart failure and other comorbidities.;Nutrition handout(s) given to patient.    Expected Outcomes Short Term Goal: Understand basic principles of dietary content, such as calories, fat, sodium, cholesterol and nutrients.;Short Term Goal: A plan has been developed with personal nutrition goals set during dietitian appointment.;Long Term Goal: Adherence to prescribed nutrition plan.          Goals reviewed with patient; copy given to patient.

## 2023-11-22 NOTE — Progress Notes (Signed)
 Daily Session Note  Patient Details  Name: Deanna [name] MRN: [id number] Date of Birth: [birth year] Referring Provider:   Flowsheet Row Pulmonary Rehab from 06/30/2023 in [facility] Cardiac and Pulmonary Rehab  Referring Provider Malka Domino, MD    Encounter Date: 11/22/2023  Check In:  Session Check In - 11/22/23 1205       Check-In   Supervising physician immediately available to respond to emergencies See telemetry face sheet for immediately available ER MD    Location [facility]-Cardiac & Pulmonary Rehab    Staff Present Othel Durand, RN, BSN, CCRP;Meredith Tressa RN,BSN;Kelly Clyde BS, ACSM CEP, Exercise Physiologist;Kelly Bollinger [facility]    Virtual Visit No    Medication changes reported     No    Fall or balance concerns reported    No    Warm-up and Cool-down Performed on first and last piece of equipment    Resistance Training Performed Yes    VAD Patient? No    PAD/SET Patient? No      Pain Assessment   Currently in Pain? No/denies             Social History   Tobacco Use  Smoking Status Former   Current packs/day: 0.00   Average packs/day: 0.5 packs/day for 30.0 years (15.0 ttl pk-yrs)   Types: Cigarettes   Start date: 01/01/1968   Quit date: 12/31/1997   Years since quitting: 25.9  Smokeless Tobacco Never  Tobacco Comments   Quit in 1999    Goals Met:  Proper associated with RPD/PD & O2 Sat Independence with exercise equipment Exercise tolerated well No report of concerns or symptoms today  Goals Unmet:  Not Applicable  Comments: Pt able to follow exercise prescription today without complaint.  Will continue to monitor for progression.   6 Minute Walk     Row Name 06/30/23 1615 11/22/23 1139       6 Minute Walk   Phase Initial Discharge    Distance 600 feet 945 feet    Distance % Change -- 58 %    Distance Feet Change -- 345 ft    Walk Time 5.5 minutes 6 minutes    # of Rest Breaks 1 0    MPH 1.24 1.79    METS 1.38 2.34    RPE 15  14    Perceived Dyspnea  3 2    VO2 Peak 4.83 8.19    Symptoms Yes (comment) No    Comments SOB --    Resting HR 73 bpm 93 bpm    Resting BP 140/60 168/72    Resting Oxygen Saturation  92 % 93 %    Exercise Oxygen Saturation  during 6 min walk 92 % 93 %    Max Ex. HR 96 bpm 125 bpm    Max Ex. BP 160/66 162/80    2 Minute Post BP 138/70 122/80      Interval HR   1 Minute HR 86 113    2 Minute HR 91 120    3 Minute HR 93 122    4 Minute HR 93 122    5 Minute HR 96 123    6 Minute HR 93 125    2 Minute Post HR 77 92    Interval Heart Rate? Yes Yes      Interval Oxygen   Interval Oxygen? Yes Yes    Baseline Oxygen Saturation % 92 % 93 %    1 Minute Oxygen Saturation % 93 %  96 %    1 Minute Liters of Oxygen 0 L 0 L    2 Minute Oxygen Saturation % 92 % 95 %    2 Minute Liters of Oxygen 0 L 0 L    3 Minute Oxygen Saturation % 93 % 94 %    3 Minute Liters of Oxygen 0 L 0 L    4 Minute Oxygen Saturation % 93 % 94 %    4 Minute Liters of Oxygen 0 L 0 L    5 Minute Oxygen Saturation % 92 % 93 %    5 Minute Liters of Oxygen 0 L 0 L    6 Minute Oxygen Saturation % 92 % 93 %    6 Minute Liters of Oxygen 0 L 0 L    2 Minute Post Oxygen Saturation % 94 % 97 %    2 Minute Post Liters of Oxygen 0 L 0 L         Dr. Oneil Pinal is Medical Director for [facility] Cardiac Rehabilitation.  Dr. Fuad Aleskerov is Medical Director for [facility] Pulmonary Rehabilitation.

## 2023-11-24 DIAGNOSIS — R0602 Shortness of breath: Secondary | ICD-10-CM

## 2023-11-24 NOTE — Progress Notes (Signed)
 Discharge Summary  Deanna [name] DOB: [birth year]  Deanna [name] graduated today from rehab with 33 sessions completed. Details of the patient's exercise prescription and what She needs to do in order to continue the prescription and progress were discussed with patient. Patient was given a copy of prescription and goals. Patient verbalized understanding. Deanna [name] plans to continue to exercise by attending fitness center in her community.    6 Minute Walk     Row Name 06/30/23 1615 11/22/23 1139       6 Minute Walk   Phase Initial Discharge    Distance 600 feet 945 feet    Distance % Change -- 58 %    Distance Feet Change -- 345 ft    Walk Time 5.5 minutes 6 minutes    # of Rest Breaks 1 0    MPH 1.24 1.79    METS 1.38 2.34    RPE 15 14    Perceived Dyspnea  3 2    VO2 Peak 4.83 8.19    Symptoms Yes (comment) No    Comments SOB --    Resting HR 73 bpm 93 bpm    Resting BP 140/60 168/72    Resting Oxygen Saturation  92 % 93 %    Exercise Oxygen Saturation  during 6 min walk 92 % 93 %    Max Ex. HR 96 bpm 125 bpm    Max Ex. BP 160/66 162/80    2 Minute Post BP 138/70 122/80      Interval HR   1 Minute HR 86 113    2 Minute HR 91 120    3 Minute HR 93 122    4 Minute HR 93 122    5 Minute HR 96 123    6 Minute HR 93 125    2 Minute Post HR 77 92    Interval Heart Rate? Yes Yes      Interval Oxygen   Interval Oxygen? Yes Yes    Baseline Oxygen Saturation % 92 % 93 %    1 Minute Oxygen Saturation % 93 % 96 %    1 Minute Liters of Oxygen 0 L 0 L    2 Minute Oxygen Saturation % 92 % 95 %    2 Minute Liters of Oxygen 0 L 0 L    3 Minute Oxygen Saturation % 93 % 94 %    3 Minute Liters of Oxygen 0 L 0 L    4 Minute Oxygen Saturation % 93 % 94 %    4 Minute Liters of Oxygen 0 L 0 L    5 Minute Oxygen Saturation % 92 % 93 %    5 Minute Liters of Oxygen 0 L 0 L    6 Minute Oxygen Saturation % 92 % 93 %    6 Minute Liters of Oxygen 0 L 0 L    2 Minute Post Oxygen Saturation % 94 %  97 %    2 Minute Post Liters of Oxygen 0 L 0 L

## 2023-11-24 NOTE — Progress Notes (Signed)
 Pulmonary Individual Treatment Plan  Patient Details  Name: Deanna [name] MRN: [id number] Date of Birth: 1945/10/02 Referring Provider:   Flowsheet Row Pulmonary Rehab from 06/30/2023 in [facility]  Referring Provider Assaker, Darrin, MD    Initial Encounter Date:  Flowsheet Row Pulmonary Rehab from 06/30/2023 in [facility]  Date 06/30/23    Visit Diagnosis: SOB (shortness of breath)  Patient's Home Medications on Admission:  Current Outpatient Medications:    albuterol  (VENTOLIN  HFA) 108 (90 Base) MCG/ACT inhaler, Inhale 2 puffs into the lungs every 6 (six) hours as needed for wheezing or shortness of breath., Disp: 8 g, Rfl: 2   apixaban  (ELIQUIS ) 5 MG TABS tablet, Take 1 tablet (5 mg total) by mouth 2 (two) times daily. Hold medication from 9/15 to 9/21 for right hip surgery, Disp: 180 tablet, Rfl: 1   Ascorbic Acid  (VITAMIN C ) 1000 MG tablet, Take 4,000 mg by mouth daily., Disp: , Rfl:    azithromycin  (ZITHROMAX ) 250 MG tablet, Take 1 tablet (250 mg total) by mouth daily. Take first 2 tablets together, then 1 every day until finished., Disp: 6 tablet, Rfl: 0   budesonide -formoterol  (SYMBICORT ) 160-4.5 MCG/ACT inhaler, Inhale 2 puffs into the lungs in the morning and at bedtime., Disp: 1 each, Rfl: 12   Calcium -Magnesium  (CAL-MAG PO), Take 2,000 mg by mouth at bedtime. 1000 mg, Disp: , Rfl:    Carboxymethylcellul-Glycerin  (CLEAR EYES FOR DRY EYES OP), Place 1 drop into both eyes daily., Disp: , Rfl:    [employer] MAGNESIUM  OXIDE 250 MG TABS, TAKE 1 TABLET (250 MG TOTAL) BY MOUTH EVERY EVENING. TAKE WITH EVENING MEAL, Disp: 100 tablet, Rfl: 1   furosemide  (LASIX ) 20 MG tablet, Take 20 mg by mouth as needed., Disp: , Rfl:    hydrochlorothiazide  (HYDRODIURIL ) 25 MG tablet, Take 1 tablet (25 mg total) by mouth 2 (two) times daily., Disp: 180 tablet, Rfl: 3   HYDROcodone -acetaminophen  (NORCO/VICODIN) 5-325 MG tablet, Take 1-2 tablets by mouth every  4 (four) hours as needed for severe pain., Disp: 42 tablet, Rfl: 0   loratadine  (CLARITIN ) 10 MG tablet, Take 10 mg by mouth daily as needed for allergies., Disp: , Rfl:    losartan  (COZAAR ) 50 MG tablet, TAKE 1 TABLET BY MOUTH EVERY DAY, Disp: 90 tablet, Rfl: 1   methocarbamol  (ROBAXIN ) 500 MG tablet, Take 1 tablet (500 mg total) by mouth every 6 (six) hours as needed for muscle spasms (muscle pain)., Disp: 40 tablet, Rfl: 2   metoprolol  succinate (TOPROL  XL) 50 MG 24 hr tablet, Take 1 tablet (50 mg total) by mouth daily. Take with or immediately following a meal., Disp: 90 tablet, Rfl: 3   milk thistle 175 MG tablet, Take 175 mg by mouth daily., Disp: , Rfl:    nitroGLYCERIN  (NITROSTAT ) 0.4 MG SL tablet, Place 1 tablet (0.4 mg total) under the tongue every 5 (five) minutes x 3 doses as needed for chest pain., Disp: 25 tablet, Rfl: 1   polyethylene glycol (MIRALAX  / GLYCOLAX ) 17 g packet, Take 17 g by mouth 2 (two) times daily., Disp: 14 each, Rfl: 0   Probiotic Product (PROBIOTIC DAILY PO), Take 1 tablet by mouth daily., Disp: , Rfl:    rosuvastatin  (CRESTOR ) 20 MG tablet, Take 1 tablet (20 mg total) by mouth daily., Disp: 30 tablet, Rfl: 6   umeclidinium bromide  (INCRUSE ELLIPTA ) 62.5 MCG/ACT AEPB, Inhale 1 puff into the lungs daily., Disp: 1 each, Rfl: 6   Vitamin A   2400 MCG (8000 UT) CAPS, Take 16,000 Units by mouth daily. 8000 units, Disp: , Rfl:    Vitamin D, Ergocalciferol, (DRISDOL) 1.25 MG (50000 UNIT) CAPS capsule, Take 50,000 Units by mouth once a week., Disp: , Rfl:   Past Medical History: Past Medical History:  Diagnosis Date   A-fib (HCC)    Abnormal glucose 03/06/2020   Acute coronary syndrome (HCC) 02/09/2023   Age-related osteoporosis without current pathological fracture 11/22/2015   Overview:  Femoral neck  Formatting of this note might be different from the original. Femoral neck   Allergic rhinitis due to pollen 08/19/2015   Allergy    Aortic atherosclerosis (HCC)  04/04/2021   Arthritis    Carpal tunnel syndrome, bilateral 02/28/2002   Overview:  H/O CTS   Celiac disease    Cervical disc disorder 05/10/2009   Chronic cough 02/15/2006   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 11/26/2015   Formatting of this note might be different from the original. Added automatically from request for surgery [id number]   Chronic pain syndrome 04/01/2015   Colon polyps    Dyspnea    Essential hypertension 07/20/2000   Overview:  HBP   Family history of adverse reaction to anesthesia    mother delerium   Female cystocele 02/27/2009   GAD (generalized anxiety disorder) 08/19/2015   GERD (gastroesophageal reflux disease)    Heart murmur    History of Bell's palsy 03/21/2015   Hx of adenomatous colonic polyps 12/20/2008   Overview:  Colonoscopy 07/2003 - [facility] - polyps removed - recommended repeat in 3 years Colonoscopy 09/2011 - Dr. Brenita - hyperplastic polyp(s) were removed - repeat in 5 years   Hypertension    Incomplete uterovaginal prolapse 03/04/2004   Insomnia    Left bundle-branch block 05/20/2006   Lumbar radiculopathy 01/09/2016   Formatting of this note might be different from the original. 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 Femoral neck   Other intervertebral disc degeneration, lumbar region 01/29/2015   Pre-operative cardiovascular examination 09/03/2020   Primary osteoarthritis, right hand 02/28/2002   Overview:  OA HANDS, ESP THUMBS   Radial tunnel syndrome, right 11/04/2016   Radiculopathy, lumbar region 11/22/2014   Overview:  Added automatically from request for surgery [id number]    Rhinitis, allergic 02/24/2002   Overview:  ALLERGY SYMPTOMS - states was seen by allergist in past and informed allergic to dust   S/P left total hip arthroplasty 09/17/2020   S/P total right hip arthroplasty 02/18/2023   Sacroiliitis (HCC) 08/02/2019   Screening for colon cancer 04/17/2005   Formatting of this note might be different from the original. 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 TRIGGER THUMBS - started spring 2003. Injected 02/28/02   Unspecified asthma, uncomplicated 12/25/2004    Tobacco Use: Social History   Tobacco Use  Smoking  Status Former   Current packs/day: 0.00   Average packs/day: 0.5 packs/day for 30.0 years (15.0 ttl pk-yrs)   Types: Cigarettes   Start date: 01/01/1968   Quit date: 12/31/1997   Years since quitting: 25.9  Smokeless Tobacco Never  Tobacco Comments   Quit in 1999    Labs: Review Flowsheet  More data exists      Latest Ref Rng & Units 01/23/2021 06/26/2021 11/04/2021 02/10/2023 02/11/2023  Labs for ITP Cardiac and Pulmonary Rehab  Cholestrol 0 - 200 mg/dL - - 846  824  -  LDL (calc) 0 - 99 mg/dL - - 89  96  -  HDL-C >59 mg/dL - - 54  42  -  Trlycerides <150 mg/dL - - 47  813  -  Hemoglobin A1c 4.8 - 5.6 % 6.1  6.0  5.8  - 6.0      Pulmonary Assessment Scores:  Pulmonary Assessment Scores     Row Name 07/07/23 1348 11/22/23 1214 11/24/23 1125     ADL UCSD   ADL Phase Entry Exit Exit   SOB Score total 76 -- 54   Rest 1 -- 0   Walk 4 -- 2   Stairs 5 -- 3   Bath 2 -- 1   Dress 3 -- 2   Shop 3 -- 3     CAT Score   CAT Score 21 -- 11     mMRC Score   mMRC Score -- 1 --       UCSD: Self-administered rating of dyspnea associated with activities of daily living (ADLs) 6-point scale (0 = not at all to 5 = maximal or unable to do because of breathlessness)  Scoring Scores range from 0 to 120.  Minimally important difference is 5 units  CAT: CAT can identify the health impairment of COPD patients and is better correlated with disease progression.  CAT has a scoring range of zero to 40. The CAT score is classified into four groups of low (less than 10), medium (10 - 20), high (21-30) and very high (31-40) based on the impact level of disease on health status. A CAT score over 10 suggests significant symptoms.  A worsening CAT score could be explained by an exacerbation, poor medication adherence, poor inhaler technique, or progression of COPD or comorbid conditions.  CAT MCID is 2 points  mMRC: mMRC (Modified Medical Research Council) Dyspnea Scale is used to assess the degree of baseline functional disability in patients of respiratory disease due to dyspnea. No minimal important difference is established. A decrease in score of 1 point or greater is considered a positive change.   Pulmonary Function Assessment:  Pulmonary Function Assessment - 06/28/23 1027       Breath   Shortness of Breath Yes;Limiting activity;Fear of Shortness of Breath          Exercise Target Goals: Exercise Program Goal: Individual exercise prescription set using results from initial 6 min walk test and THRR while considering  patient's activity barriers and safety.   Exercise Prescription Goal: Initial exercise prescription builds to 30-45 minutes a day of aerobic activity, 2-3 days per week.  Home exercise guidelines will be given to patient during program as part of exercise prescription that the participant will acknowledge.  Education: Aerobic Exercise: - Group verbal and visual presentation on the components of exercise prescription. Introduces F.I.T.T principle from ACSM  for exercise prescriptions.  Reviews F.I.T.T. principles of aerobic exercise including progression. Written material given at graduation.  Education: Resistance Exercise: - Group verbal and visual presentation on the components of exercise prescription. Introduces F.I.T.T principle from ACSM for exercise prescriptions  Reviews F.I.T.T. principles of resistance exercise including progression. Written material given at graduation.    Education: Exercise & Equipment Safety: - Individual verbal instruction and demonstration of equipment use and safety with use of the equipment. Flowsheet Row Pulmonary Rehab from 06/30/2023 in [place] [facility]  Date 06/30/23  Educator MB  Instruction Review Code 1- Verbalizes Understanding    Education: Exercise Physiology & General Exercise Guidelines: - Group verbal and written instruction with models to review the exercise physiology of the cardiovascular system and associated critical values. Provides general exercise guidelines with specific guidelines to those with heart or lung disease.    Education: Flexibility, Balance, Mind/Body Relaxation: - Group verbal and visual presentation with interactive activity on the components of exercise prescription. Introduces F.I.T.T principle from ACSM for exercise prescriptions. Reviews F.I.T.T. principles of flexibility and balance exercise training including progression. Also discusses the mind body connection.  Reviews various relaxation techniques to help reduce and manage stress (i.e. Deep breathing, progressive muscle relaxation, and visualization). Balance handout provided to take home. Written material given at graduation.   Activity Barriers & Risk Stratification:  Activity Barriers & Cardiac Risk Stratification - 06/30/23 1617       Activity Barriers & Cardiac Risk Stratification   Activity Barriers Arthritis;Left Hip Replacement;Right Hip Replacement;Back Problems;Other (comment);Shortness  of Breath    Comments R patella fracture          6 Minute Walk:  6 Minute Walk     Row Name 06/30/23 1615 11/22/23 1139       6 Minute Walk   Phase Initial Discharge    Distance 600 feet 945 feet    Distance % Change -- 58 %    Distance Feet Change -- 345 ft    Walk Time 5.5 minutes 6 minutes    # of Rest Breaks 1 0    MPH 1.24 1.79    METS 1.38 2.34    RPE 15 14    Perceived Dyspnea  3 2    VO2 Peak 4.83 8.19    Symptoms Yes (comment) No    Comments SOB --    Resting HR 73 bpm 93 bpm    Resting BP 140/60 168/72    Resting Oxygen Saturation  92 % 93 %    Exercise Oxygen Saturation  during 6 min walk 92 % 93 %    Max Ex. HR 96 bpm 125 bpm    Max Ex. BP 160/66 162/80    2 Minute Post BP 138/70 122/80      Interval HR   1 Minute HR 86 113    2 Minute HR 91 120    3 Minute HR 93 122    4 Minute HR 93 122    5 Minute HR 96 123    6 Minute HR 93 125    2 Minute Post HR 77 92    Interval Heart Rate? Yes Yes      Interval Oxygen   Interval Oxygen? Yes Yes    Baseline Oxygen Saturation % 92 % 93 %    1 Minute Oxygen Saturation % 93 % 96 %    1 Minute Liters of Oxygen 0 L 0 L    2 Minute Oxygen Saturation % 92 % 95 %    2 Minute Liters of Oxygen 0 L 0  L    3 Minute Oxygen Saturation % 93 % 94 %    3 Minute Liters of Oxygen 0 L 0 L    4 Minute Oxygen Saturation % 93 % 94 %    4 Minute Liters of Oxygen 0 L 0 L    5 Minute Oxygen Saturation % 92 % 93 %    5 Minute Liters of Oxygen 0 L 0 L    6 Minute Oxygen Saturation % 92 % 93 %    6 Minute Liters of Oxygen 0 L 0 L    2 Minute Post Oxygen Saturation % 94 % 97 %    2 Minute Post Liters of Oxygen 0 L 0 L      Oxygen Initial Assessment:  Oxygen Initial Assessment - 06/28/23 1026       Home Oxygen   Home Oxygen Device None    Sleep Oxygen Prescription None    Home Exercise Oxygen Prescription None    Home Resting Oxygen Prescription None      Initial 6 min Walk   Oxygen Used None      Program Oxygen  Prescription   Program Oxygen Prescription None      Intervention   Short Term Goals To learn and exhibit compliance with exercise, home and travel O2 prescription;To learn and understand importance of monitoring SPO2 with pulse oximeter and demonstrate accurate use of the pulse oximeter.;To learn and understand importance of maintaining oxygen saturations>88%;To learn and demonstrate proper pursed lip breathing techniques or other breathing techniques. ;To learn and demonstrate proper use of respiratory medications    Long  Term Goals Exhibits compliance with exercise, home  and travel O2 prescription;Verbalizes importance of monitoring SPO2 with pulse oximeter and return demonstration;Maintenance of O2 saturations>88%;Exhibits proper breathing techniques, such as pursed lip breathing or other method taught during program session;Compliance with respiratory medication;Demonstrates proper use of MDI's          Oxygen Re-Evaluation:  Oxygen Re-Evaluation     Row Name 07/07/23 1137 08/09/23 1124 09/01/23 1113 09/27/23 1143 11/08/23 1130     Program Oxygen Prescription   Program Oxygen Prescription None None None None None     Home Oxygen   Home Oxygen Device None None None None None   Sleep Oxygen Prescription None None None None None   Home Exercise Oxygen Prescription None None None None None   Home Resting Oxygen Prescription None None None None None   Compliance with Home Oxygen Use Yes Yes -- Yes Yes     Goals/Expected Outcomes   Short Term Goals To learn and demonstrate proper pursed lip breathing techniques or other breathing techniques.  To learn and demonstrate proper pursed lip breathing techniques or other breathing techniques.  To learn and understand importance of maintaining oxygen saturations>88%;To learn and understand importance of monitoring SPO2 with pulse oximeter and demonstrate accurate use of the pulse oximeter. To learn and demonstrate proper pursed lip breathing  techniques or other breathing techniques.  To learn and demonstrate proper pursed lip breathing techniques or other breathing techniques.    Long  Term Goals Exhibits proper breathing techniques, such as pursed lip breathing or other method taught during program session Exhibits proper breathing techniques, such as pursed lip breathing or other method taught during program session Maintenance of O2 saturations>88%;Verbalizes importance of monitoring SPO2 with pulse oximeter and return demonstration Exhibits proper breathing techniques, such as pursed lip breathing or other method taught during program session Exhibits proper breathing techniques,  such as pursed lip breathing or other method taught during program session   Comments Reviewed PLB technique with pt.  Talked about how it works and it's importance in maintaining their exercise saturations. -- She does not have a pulse oximeter to check her oxygen saturation at home. Informed her where to get one and explained why it is important to have one. Reviewed that oxygen saturations should be 88 percent and above. Patient verbalizes understanding. Spoke with Bree about PLB and she verbalized understanding Spoke with Bree about PLB and she verbalized understanding   Goals/Expected Outcomes Short: Become more profiecient at using PLB. Long: Become independent at using PLB. -- Short: monitor oxygen at home with exertion. Long: maintain oxygen saturations above 88 percent independently. STG: monitor O2 at home during exertion. LTG: maintain oxygen saturations above 88 percent independently. STG: Monitor O2 at home during exertion. LTG: maintain oxygen saturations above 88 percent independently      Oxygen Discharge (Final Oxygen Re-Evaluation):  Oxygen Re-Evaluation - 11/08/23 1130       Program Oxygen Prescription   Program Oxygen Prescription None      Home Oxygen   Home Oxygen Device None    Sleep Oxygen Prescription None    Home Exercise Oxygen  Prescription None    Home Resting Oxygen Prescription None    Compliance with Home Oxygen Use Yes      Goals/Expected Outcomes   Short Term Goals To learn and demonstrate proper pursed lip breathing techniques or other breathing techniques.     Long  Term Goals Exhibits proper breathing techniques, such as pursed lip breathing or other method taught during program session    Comments Spoke with Bree about PLB and she verbalized understanding    Goals/Expected Outcomes STG: Monitor O2 at home during exertion. LTG: maintain oxygen saturations above 88 percent independently          Initial Exercise Prescription:  Initial Exercise Prescription - 06/30/23 1600       Date of Initial Exercise RX and Referring Provider   Date 06/30/23    Referring Provider Assaker, Darrin, MD      Oxygen   Maintain Oxygen Saturation 88% or higher      NuStep   Level 1    SPM 80    Minutes 15    METs 1.38      Arm Ergometer   Level 1    RPM 30    Minutes 15    METs 1.38      Biostep-RELP   Level 1    SPM 50    Minutes 15    METs 1.38      Track   Laps 9    Minutes 15    METs 1.49      Prescription Details   Frequency (times per week) 2    Duration Progress to 30 minutes of continuous aerobic without signs/symptoms of physical distress      Intensity   THRR 40-80% of Max Heartrate 101-129    Ratings of Perceived Exertion 11-13    Perceived Dyspnea 0-4      Progression   Progression Continue to progress workloads to maintain intensity without signs/symptoms of physical distress.      Resistance Training   Training Prescription Yes    Weight 2 lb upper, 4 lb lower    Reps 10-15          Perform Capillary Blood Glucose checks as needed.  Exercise Prescription Changes:  Exercise Prescription Changes     Row Name 06/30/23 1600 07/21/23 1100 08/05/23 0800 08/19/23 1800 08/23/23 1100     Response to Exercise   Blood Pressure (Admit) 140/60 120/62 122/64 118/74 --    Blood Pressure (Exercise) 160/66 148/70 142/80 140/80 --   Blood Pressure (Exit) 138/70 118/60 118/62 122/62 --   Heart Rate (Admit) 73 bpm 80 bpm 78 bpm 74 bpm --   Heart Rate (Exercise) 96 bpm 102 bpm 88 bpm 84 bpm --   Heart Rate (Exit) 77 bpm 83 bpm 74 bpm 76 bpm --   Oxygen Saturation (Admit) 92 % 94 % 91 % 92 % --   Oxygen Saturation (Exercise) 92 % 93 % 90 % 92 % --   Oxygen Saturation (Exit) 94 % 96 % 95 % 92 % --   Rating of Perceived Exertion (Exercise) 15 14 15 15  --   Perceived Dyspnea (Exercise) 3 4 3 2  --   Symptoms SOB SOB SOB SOB --   Comments results First two days of exercise in rehab -- -- --   Duration Progress to 30 minutes of  aerobic without signs/symptoms of physical distress Continue with 30 min of aerobic exercise without signs/symptoms of physical distress. Continue with 30 min of aerobic exercise without signs/symptoms of physical distress. Continue with 30 min of aerobic exercise without signs/symptoms of physical distress. Continue with 30 min of aerobic exercise without signs/symptoms of physical distress.   Intensity THRR New THRR unchanged THRR unchanged THRR unchanged THRR unchanged     Progression   Progression Continue to progress workloads to maintain intensity without signs/symptoms of physical distress. Continue to progress workloads to maintain intensity without signs/symptoms of physical distress. Continue to progress workloads to maintain intensity without signs/symptoms of physical distress. Continue to progress workloads to maintain intensity without signs/symptoms of physical distress. Continue to progress workloads to maintain intensity without signs/symptoms of physical distress.   Average METs 1.38 1.83 1.9 1.89 1.89     Resistance Training   Training Prescription -- Yes Yes Yes Yes   Weight -- 2 lb 2 lb 2 lb 2 lb   Reps -- 10-15 10-15 10-15 10-15     Interval Training   Interval Training -- No No No No     Treadmill   MPH -- -- 1 -- --    Grade -- -- 0 -- --   Minutes -- -- 15 -- --   METs -- -- 1.77 -- --     NuStep   Level -- 1 4 2 2    Minutes -- 15 15 15 15    METs -- 2.7 2.4 2.2 2.2     T5 Nustep   Level -- 1 1  T6 nustep -- --   Minutes -- 15 15 -- --   METs -- 1.8 1.7 -- --     Biostep-RELP   Level -- -- -- 1 1   Minutes -- -- -- 15 15   METs -- -- -- 2 2     Track   Laps -- 9 -- 12 12   Minutes -- 15 -- 15 15   METs -- 1.49 -- 1.65 1.65     Home Exercise Plan   Plans to continue exercise at -- -- -- -- [employer] (comment)  fitness center in home community   Frequency -- -- -- -- Add 2 additional days to program exercise sessions.   Initial Home Exercises Provided -- -- -- -- 08/23/23  Oxygen   Maintain Oxygen Saturation -- 88% or higher 88% or higher 88% or higher 88% or higher    Row Name 09/02/23 0700 09/15/23 1400 09/29/23 1400 10/14/23 1300 11/10/23 1300     Response to Exercise   Blood Pressure (Admit) 122/68 122/62 130/60 122/64 140/70   Blood Pressure (Exercise) 140/62 -- -- -- --   Blood Pressure (Exit) 118/60 124/64 120/62 140/64 128/80   Heart Rate (Admit) 67 bpm 69 bpm 80 bpm 72 bpm 100 bpm   Heart Rate (Exercise) 90 bpm 88 bpm 97 bpm 102 bpm 106 bpm   Heart Rate (Exit) 74 bpm 70 bpm 72 bpm 75 bpm 107 bpm   Oxygen Saturation (Admit) 95 % 95 % 96 % 90 % 96 %   Oxygen Saturation (Exercise) 93 % 90 % 90 % 90 % 92 %   Oxygen Saturation (Exit) 94 % 92 % 94 % 95 % 95 %   Rating of Perceived Exertion (Exercise) 14 15 15 15 13    Perceived Dyspnea (Exercise) 3 3 3 3  --   Symptoms none non none none none   Duration Continue with 30 min of aerobic exercise without signs/symptoms of physical distress. Continue with 30 min of aerobic exercise without signs/symptoms of physical distress. Continue with 30 min of aerobic exercise without signs/symptoms of physical distress. Continue with 30 min of aerobic exercise without signs/symptoms of physical distress. Continue with 30 min of  aerobic exercise without signs/symptoms of physical distress.   Intensity THRR unchanged THRR unchanged THRR unchanged THRR unchanged THRR unchanged     Progression   Progression Continue to progress workloads to maintain intensity without signs/symptoms of physical distress. Continue to progress workloads to maintain intensity without signs/symptoms of physical distress. Continue to progress workloads to maintain intensity without signs/symptoms of physical distress. Continue to progress workloads to maintain intensity without signs/symptoms of physical distress. Continue to progress workloads to maintain intensity without signs/symptoms of physical distress.   Average METs 1.64 1.89 1.94 2.05 1.6     Resistance Training   Training Prescription Yes Yes Yes Yes Yes   Weight 2 lb 2 lb 2 lb 2 lb 2 lb   Reps 10-15 10-15 10-15 10-15 10-15     Interval Training   Interval Training No No No No No     Recumbant Bike   Level -- 1 -- -- --   Watts -- 12 -- -- --   Minutes -- 15 -- -- --   METs -- 2.5 -- -- --     NuStep   Level 3 3 2 4  --   Minutes 15 15 15 15  --   METs 2.8 2.4 2.4 2.6 --     T5 Nustep   Level 1  T6 -- 2  T6 nustep 2 --   Minutes 15 -- 15 15 --   METs -- -- -- 1.9 --     Biostep-RELP   Level -- 1 1 1 1    Minutes -- 15 15 15 15    METs -- 2 2 2  --     Track   Laps 5 12 13 15 11    Minutes 15 15 15 15 15    METs 1.27 1.65 1.71 1.82 1.6     Home Exercise Plan   Plans to continue exercise at [employer] (comment)  fitness center in home community [employer] (comment)  fitness center in home community [employer] (comment)  fitness center in home community [employer] (comment)  fitness center in home community [employer] (comment)  fitness center in home community   Frequency Add 2 additional days to program exercise sessions. Add 2 additional days to program exercise sessions. Add 2 additional days to program exercise sessions. Add 2  additional days to program exercise sessions. Add 2 additional days to program exercise sessions.   Initial Home Exercises Provided 08/23/23 08/23/23 08/23/23 08/23/23 08/23/23     Oxygen   Maintain Oxygen Saturation 88% or higher 88% or higher 88% or higher 88% or higher 88% or higher    Row Name 11/24/23 0800             Response to Exercise   Blood Pressure (Admit) 130/70       Blood Pressure (Exit) 144/72       Heart Rate (Admit) 84 bpm       Heart Rate (Exercise) 111 bpm       Heart Rate (Exit) 80 bpm       Oxygen Saturation (Admit) 95 %       Oxygen Saturation (Exercise) 88 %       Oxygen Saturation (Exit) 92 %       Rating of Perceived Exertion (Exercise) 15       Perceived Dyspnea (Exercise) 3       Symptoms none       Duration Continue with 30 min of aerobic exercise without signs/symptoms of physical distress.       Intensity THRR unchanged         Progression   Progression Continue to progress workloads to maintain intensity without signs/symptoms of physical distress.       Average METs 2.2         Resistance Training   Training Prescription Yes       Weight 2 lb       Reps 10-15         Interval Training   Interval Training No         NuStep   Level 2       Minutes 15       METs 3.6         T5 Nustep   Level 2  T6       Minutes 15       METs 1.07         Biostep-RELP   Level 1       Minutes 15       METs 2         Track   Laps 22       Minutes 15       METs 2.2         Home Exercise Plan   Plans to continue exercise at [employer] (comment)  fitness center in home community       Frequency Add 2 additional days to program exercise sessions.       Initial Home Exercises Provided 08/23/23         Oxygen   Maintain Oxygen Saturation 88% or higher          Exercise Comments:   Exercise Comments     Row Name 07/07/23 1138           Exercise Comments First full day of exercise!  Patient was oriented to gym and equipment  including functions, settings, policies, and procedures.  Patient's individual exercise prescription and treatment plan were reviewed.  All starting workloads were established based on the results  of the 6 minute walk test done at initial orientation visit.  The plan for exercise progression was also introduced and progression will be customized based on patient's performance and goals.          Exercise Goals and Review:   Exercise Goals     Row Name 06/30/23 1621             Exercise Goals   Increase Physical Activity Yes       Intervention Provide advice, education, support and counseling about physical activity/exercise needs.;Develop an individualized exercise prescription for aerobic and resistive training based on initial evaluation findings, risk stratification, comorbidities and participant's personal goals.       Expected Outcomes Short Term: Attend rehab on a regular basis to increase amount of physical activity.;Long Term: Add in home exercise to make exercise part of routine and to increase amount of physical activity.;Long Term: Exercising regularly at least 3-5 days a week.       Increase Strength and Stamina Yes       Intervention Provide advice, education, support and counseling about physical activity/exercise needs.;Develop an individualized exercise prescription for aerobic and resistive training based on initial evaluation findings, risk stratification, comorbidities and participant's personal goals.       Expected Outcomes Short Term: Increase workloads from initial exercise prescription for resistance, speed, and METs.;Short Term: Perform resistance training exercises routinely during rehab and add in resistance training at home;Long Term: Improve cardiorespiratory fitness, muscular endurance and strength as measured by increased METs and functional capacity ( )       Able to understand and use rate of perceived exertion (RPE) scale Yes       Intervention Provide  education and explanation on how to use RPE scale       Expected Outcomes Short Term: Able to use RPE daily in rehab to express subjective intensity level;Long Term:  Able to use RPE to guide intensity level when exercising independently       Able to understand and use Dyspnea scale Yes       Intervention Provide education and explanation on how to use Dyspnea scale       Expected Outcomes Short Term: Able to use Dyspnea scale daily in rehab to express subjective sense of shortness of breath during exertion;Long Term: Able to use Dyspnea scale to guide intensity level when exercising independently       Knowledge and understanding of Target Heart Rate Range (THRR) Yes       Intervention Provide education and explanation of THRR including how the numbers were predicted and where they are located for reference       Expected Outcomes Short Term: Able to state/look up THRR;Short Term: Able to use daily as guideline for intensity in rehab;Long Term: Able to use THRR to govern intensity when exercising independently       Able to check pulse independently Yes       Intervention Provide education and demonstration on how to check pulse in carotid and radial arteries.;Review the importance of being able to check your own pulse for safety during independent exercise       Expected Outcomes Short Term: Able to explain why pulse checking is important during independent exercise;Long Term: Able to check pulse independently and accurately       Understanding of Exercise Prescription Yes       Intervention Provide education, explanation, and written materials on patient's individual exercise prescription       Expected  Outcomes Short Term: Able to explain program exercise prescription;Long Term: Able to explain home exercise prescription to exercise independently          Exercise Goals Re-Evaluation :  Exercise Goals Re-Evaluation     Row Name 07/07/23 1136 07/21/23 1136 08/05/23 0842 08/09/23 1122 08/19/23  1828     Exercise Goal Re-Evaluation   Exercise Goals Review Able to understand and use rate of perceived exertion (RPE) scale;Able to understand and use Dyspnea scale;Knowledge and understanding of Target Heart Rate Range (THRR);Understanding of Exercise Prescription Increase Physical Activity;Increase Strength and Stamina;Understanding of Exercise Prescription Increase Physical Activity;Increase Strength and Stamina;Understanding of Exercise Prescription Increase Physical Activity;Increase Strength and Stamina;Understanding of Exercise Prescription Increase Physical Activity;Increase Strength and Stamina;Understanding of Exercise Prescription   Comments Reviewed RPE and dyspnea scale, THR and program prescription with pt today.  Pt voiced understanding and was given a copy of goals to take home. Cecillia is off to a good start in rehab. She was able to walk up to 9 laps on the track. She also did well at level 1 on the T4 and T5 nustep machines. We will continue to monitor her progress in the program. Cecillia is doing well in rehab. She began using the treadmill and did well at 1 mph with no incline. She also improved to level 4 on the T4 nustep and did well with 2 lb hand weights for resistance training. We will continue to monitor her progress in the program. Cecillia is doing well here at rehab, reports she is not doing much at home. Encouraged her to increase workload here at rehab as able. She says some days she is exhausted after exercise and affects her ability to do daily task. Cecillia continues to do well in the program. She was recently able to increase her laps on the track from 9 to 12. We will continue to monitor her progress in the program.   Expected Outcomes Short: Use RPE daily to regulate intensity. Long: Follow program prescription in THR. Short: Continue to follow current exercise prescription. Long: Continue exercise to improve strength and stamina. Short: Continue to progressively increase workloads.  Long: Continue exercise to improve strength and stamina. STG: increase workload as able. LTG: Continue exercise to improve strength and stamina Short: Increase level on the Biostep to 2. Long: Continue exercise to improve strength and stamina.    Row Name 08/23/23 1159 09/02/23 0759 09/15/23 1431 09/27/23 1141 09/29/23 1434     Exercise Goal Re-Evaluation   Exercise Goals Review Increase Physical Activity;Able to understand and use rate of perceived exertion (RPE) scale;Knowledge and understanding of Target Heart Rate Range (THRR);Understanding of Exercise Prescription;Increase Strength and Stamina;Able to understand and use Dyspnea scale;Able to check pulse independently Increase Physical Activity;Understanding of Exercise Prescription;Increase Strength and Stamina Increase Physical Activity;Understanding of Exercise Prescription;Increase Strength and Stamina Increase Physical Activity;Increase Strength and Stamina;Understanding of Exercise Prescription Increase Physical Activity;Increase Strength and Stamina;Understanding of Exercise Prescription   Comments Reviewed home exercise with pt today from 11:20 am to 11:32 am.  Pt plans to finish PT and then start going to her community fitness center for exercise.  Reviewed THR, pulse, RPE, sign and symptoms, pulse oximetery and when to call 911 or MD.  Also discussed weather considerations and indoor options.  Pt voiced understanding. Payzlee is doing well in rehab. She increased to level 3 on the T4 nustep. She decreased to 5 laps from 12 laps on the track in this review. We will continue to monitor  her progress in the program. Cecillia is doing well in rehab. She was able to maintain her intensity on the T4 nustep at level 3. She was also able to return to 12 laps on the track in 15 minutes. We will continue to monitor her progress in the program. Cecillia is doing well in rehab. She is walking the track and using the T4. Encouraged her to increase workload on T4 as  able. She has been getting more laps as of late. She is not exercising much outside of rehab, she is walking some. SPoke with her about long term plan and looking to find a gym to do stepper exercise similar to the T4. Cecillia continues to do well in rehab. She increased her laps on the track as she most recently walked 13 laps. She also improved to level 2 on the T6 nustep. We will continue to monitor her progress in the program.   Expected Outcomes Short: add1-2 days a week of exercise at home on off days of rehab. Long: maintain independent exercise routine. Short: Push for more laps on the track to get back to 12 laps. Long: Continue exercise to improve strength and stamina. Short: Increase to level 4 on the T4 nustep, increase to level 2 on the biostep. Long: Continue exercise to improve strength and stamina. STG: increase workload on T4, find a gym. LTG: Continue exercise to improve strength and stamina. Short: Continue to push for more laps on the track. Long: Continue exercise to improve strength and stamina.    Row Name 10/14/23 1353 10/28/23 0955 11/08/23 1122 11/10/23 1343 11/24/23 0813     Exercise Goal Re-Evaluation   Exercise Goals Review Increase Physical Activity;Increase Strength and Stamina;Understanding of Exercise Prescription Increase Physical Activity;Increase Strength and Stamina;Understanding of Exercise Prescription Increase Physical Activity;Increase Strength and Stamina;Understanding of Exercise Prescription Increase Physical Activity;Increase Strength and Stamina;Understanding of Exercise Prescription Increase Physical Activity;Increase Strength and Stamina;Understanding of Exercise Prescription   Comments Cecillia is doing well in rehab. She was recently able to increase from level 2 to 4 on the T4 nustep. She was also able to maintain her 15 laps on the track, and maintain level 2 on the T5 nustep. We will continue to monitor her progress in the program. Cecillia has not attended rehab  since the last review. We recently called and she stated that she has been out of the country and will return on Monday, 11/01/2023. We will continue to monitor her progress when she returns to the program. Cecillia has returned from her vacation in [place], she is happy to be back at rehab. She is working on getting bak into her home exercises Cecillia is doing well. She was only able to attend one session during this review period. During her session she was able to use the biosetp at level 1 and walk 11 laps on the track. We will continue to monitor her progress in the program. Cecillia continues to do well in the program. She was recently able to increase from 11 laps to 22 laps on the track. She was also able to maintain level 2 on both the T6 and T4 nustep. We will continue to monitor her progress in the program.   Expected Outcomes Short: Continue to push for more laps on the track. Long: Continue exercise to improve strength and stamina. Short: Return to rehab. Long: Continue exercise to improve strength and stamina. STG: Attend rehab regularly. LTG: Continue exercise to improve strength and stamina. Short: Attend rehab more  frequently. Long: Continue exercise to improve strength and stamina. Short: Attend rehab more frequently, continue to increase track laps. Long: Continue exercise to improve strength and stamina.      Discharge Exercise Prescription (Final Exercise Prescription Changes):  Exercise Prescription Changes - 11/24/23 0800       Response to Exercise   Blood Pressure (Admit) 130/70    Blood Pressure (Exit) 144/72    Heart Rate (Admit) 84 bpm    Heart Rate (Exercise) 111 bpm    Heart Rate (Exit) 80 bpm    Oxygen Saturation (Admit) 95 %    Oxygen Saturation (Exercise) 88 %    Oxygen Saturation (Exit) 92 %    Rating of Perceived Exertion (Exercise) 15    Perceived Dyspnea (Exercise) 3    Symptoms none    Duration Continue with 30 min of aerobic exercise without signs/symptoms of physical  distress.    Intensity THRR unchanged      Progression   Progression Continue to progress workloads to maintain intensity without signs/symptoms of physical distress.    Average METs 2.2      Resistance Training   Training Prescription Yes    Weight 2 lb    Reps 10-15      Interval Training   Interval Training No      NuStep   Level 2    Minutes 15    METs 3.6      T5 Nustep   Level 2   T6   Minutes 15    METs 1.07      Biostep-RELP   Level 1    Minutes 15    METs 2      Track   Laps 22    Minutes 15    METs 2.2      Home Exercise Plan   Plans to continue exercise at [employer] (comment)   fitness center in home community   Frequency Add 2 additional days to program exercise sessions.    Initial Home Exercises Provided 08/23/23      Oxygen   Maintain Oxygen Saturation 88% or higher          Nutrition:  Target Goals: Understanding of nutrition guidelines, daily intake of sodium 1500mg , cholesterol 200mg , calories 30% from fat and 7% or less from saturated fats, daily to have 5 or more servings of fruits and vegetables.  Education: All About Nutrition: -Group instruction provided by verbal, written material, interactive activities, discussions, models, and posters to present general guidelines for heart healthy nutrition including fat, fiber, MyPlate, the role of sodium in heart healthy nutrition, utilization of the nutrition label, and utilization of this knowledge for meal planning. Follow up email sent as well. Written material given at graduation.   Biometrics:  Pre Biometrics - 06/30/23 1622       Pre Biometrics   Height 5' 3.5 (1.613 m)    Weight 165 lb 12.8 oz (75.2 kg)    Waist Circumference 39.5 inches    Hip Circumference 44.9 inches    Waist to Hip Ratio 0.88 %    BMI (Calculated) 28.91    Single Leg Stand 6.9 seconds          Post Biometrics - 11/22/23 1212        Post  Biometrics   Height 5' 3.5 (1.613 m)    Weight 166  lb 4.8 oz (75.4 kg)    Waist Circumference 39 inches    Hip Circumference 46.5 inches  Waist to Hip Ratio 0.84 %    BMI (Calculated) 28.99    Single Leg Stand 6.81 seconds          Nutrition Therapy Plan and Nutrition Goals:  Nutrition Therapy & Goals - 06/30/23 1605       Nutrition Therapy   Diet Carb controlled, cardiac, low Na    Protein (specify units) 75g    Fiber 25 grams    Whole Grain Foods 3 servings    Saturated Fats 15 max. grams    Fruits and Vegetables 5 servings/day    Sodium 2 grams      Personal Nutrition Goals   Nutrition Goal Eat 15-30gProtein and 30-60gCarbs at each meal.    Personal Goal #2 Practice mindful snacking and limit sugary treats to a few times per week    Personal Goal #3 Read labels and reduce sodium intake to below 2300mg . Ideally 1500mg  per day.    Comments Patient drinking ~60oz of water  daily. She usually eats 3 times per day, likes fruits and veggies. Doesn't eat much animal meat, but does like fish and some chicken. She struggles with snacking and sugary foods. Her A1C is ~6.0%. She is aware of carb choices and portions. Reviewed mediterranean diet handout, educated on types of fats, sources, and how to read label. Provided sample meal plates handout and build out meals and snacks with foods she likes and will eat, focusing on adequate protein while controlling complex carb portions and including lots of colorful produce.      Intervention Plan   Intervention Prescribe, educate and counsel regarding individualized specific dietary modifications aiming towards targeted core components such as weight, hypertension, lipid management, diabetes, heart failure and other comorbidities.;Nutrition handout(s) given to patient.    Expected Outcomes Short Term Goal: Understand basic principles of dietary content, such as calories, fat, sodium, cholesterol and nutrients.;Short Term Goal: A plan has been developed with personal nutrition goals set during  dietitian appointment.;Long Term Goal: Adherence to prescribed nutrition plan.          Nutrition Assessments:  MEDIFICTS Score Key: >=70 Need to make dietary changes  40-70 Heart Healthy Diet <= 40 Therapeutic Level Cholesterol Diet  Flowsheet Row Pulmonary Rehab from 11/24/2023 in [facility]  Picture Your Plate Total Score on Discharge 68   Picture Your Plate Scores: <59 Unhealthy dietary pattern with much room for improvement. 41-50 Dietary pattern unlikely to meet recommendations for good health and room for improvement. 51-60 More healthful dietary pattern, with some room for improvement.  >60 Healthy dietary pattern, although there may be some specific behaviors that could be improved.   Nutrition Goals Re-Evaluation:  Nutrition Goals Re-Evaluation     Row Name 08/09/23 1128 09/01/23 1117 09/27/23 1147 11/08/23 1134       Goals   Comment Spoke with her about healthy eating, including lots of colorful produce and quality protein. Controlling carb choices to pick complex carbs over refined, processed and concentrated sugary products. Patient was informed on why it is important to maintain a balanced diet when dealing with Respiratory issues. Explained that it takes a lot of energy to breath and when they are short of breath often they will need to have a good diet to help keep up with the calories they are expending for breathing. Patient feels she is doing well on nutrition. Says she is eating smaller portions but feels she needs to do better with adding protien. Set goal to increase protein intake.  RD reviewed and brainstormed some ideas to include more protein into her meals and snacks. Cecillia returns from vacation in [place]. she reports she ate well there and now that she is back wants to get back into her routine but adding more healthy habits. Encouraged her to make balanced meals with whole grains and fruit. Reminded her to include protein at meals as well.     Expected Outcome STG: meet protein goal of >70g. LTG: follow a heart healthy diet and lifestyle plan Short: Choose and plan snacks accordingly to patients caloric intake to improve breathing. Long: Maintain a diet independently that meets their caloric intake to aid in daily shortness of breath. STG: increase proten intake, include protien at most meals and snacks. LTG: Maintain a diet independently that meets their caloric intake to aid in daily shortness of breath. STG:: Make balanced meals with whole grains paired with protein. LTG: Maintain a diet independently that meets their caloric intake to aid in daily shortness of breath.       Nutrition Goals Discharge (Final Nutrition Goals Re-Evaluation):  Nutrition Goals Re-Evaluation - 11/08/23 1134       Goals   Comment Bree returns from vacation in [place]. she reports she ate well there and now that she is back wants to get back into her routine but adding more healthy habits. Encouraged her to make balanced meals with whole grains and fruit. Reminded her to include protein at meals as well.    Expected Outcome STG:: Make balanced meals with whole grains paired with protein. LTG: Maintain a diet independently that meets their caloric intake to aid in daily shortness of breath.          Psychosocial: Target Goals: Acknowledge presence or absence of significant depression and/or stress, maximize coping skills, provide positive support system. Participant is able to verbalize types and ability to use techniques and skills needed for reducing stress and depression.   Education: Stress, Anxiety, and Depression - Group verbal and visual presentation to define topics covered.  Reviews how body is impacted by stress, anxiety, and depression.  Also discusses healthy ways to reduce stress and to treat/manage anxiety and depression.  Written material given at graduation.   Education: Sleep Hygiene -Provides group verbal and written instruction about  how sleep can affect your health.  Define sleep hygiene, discuss sleep cycles and impact of sleep habits. Review good sleep hygiene tips.    Initial Review & Psychosocial Screening:  Initial Psych Review & Screening - 06/28/23 1028       Initial Review   Current issues with Current Sleep Concerns;Current Stress Concerns    Source of Stress Concerns Chronic Illness      Family Dynamics   Good Support System? Yes    Comments Shahara lives by herself and can call her daughterif she needs support. Sometimes she has problems sleeping due to her shortness of breath. She states no depression and does not take anything for her mood.      Barriers   Psychosocial barriers to participate in program The patient should benefit from training in stress management and relaxation.      Screening Interventions   Interventions Encouraged to exercise;To provide support and resources with identified psychosocial needs;Provide feedback about the scores to participant    Expected Outcomes Short Term goal: Utilizing psychosocial counselor, staff and physician to assist with identification of specific Stressors or current issues interfering with healing process. Setting desired goal for each stressor or current issue identified.;Long  Term Goal: Stressors or current issues are controlled or eliminated.;Short Term goal: Identification and review with participant of any Quality of Life or Depression concerns found by scoring the questionnaire.;Long Term goal: The participant improves quality of Life and PHQ9 Scores as seen by post scores and/or verbalization of changes          Quality of Life Scores:  Scores of 19 and below usually indicate a poorer quality of life in these areas.  A difference of  2-3 points is a clinically meaningful difference.  A difference of 2-3 points in the total score of the Quality of Life Index has been associated with significant improvement in overall quality of life, self-image,  physical symptoms, and general health in studies assessing change in quality of life.  PHQ-9: Review Flowsheet  More data exists      11/24/2023 09/01/2023 06/30/2023 01/14/2022 12/26/2020  Depression screen PHQ 2/9  Decreased Interest 1 1 1  0 0  Down, Depressed, Hopeless 0 0 0 0 0  PHQ - 2 Score 1 1 1  0 0  Altered sleeping 1 1 2  - -  Tired, decreased energy 0 1 2 - -  Change in appetite 1 0 0 - -  Feeling bad or failure about yourself  0 0 0 - -  Trouble concentrating 0 0 1 - -  Moving slowly or fidgety/restless 0 0 0 - -  Suicidal thoughts 0 0 0 - -  PHQ-9 Score 3 3 6  - -  Difficult doing work/chores Not difficult at all Somewhat difficult Very difficult - -   Interpretation of Total Score  Total Score Depression Severity:  1-4 = Minimal depression, 5-9 = Mild depression, 10-14 = Moderate depression, 15-19 = Moderately severe depression, 20-27 = Severe depression   Psychosocial Evaluation and Intervention:  Psychosocial Evaluation - 06/28/23 1030       Psychosocial Evaluation & Interventions   Interventions Relaxation education;Stress management education;Encouraged to exercise with the program and follow exercise prescription    Comments Analeigh lives by herself and can call her daughterif she needs support. Sometimes she has problems sleeping due to her shortness of breath. She states no depression and does not take anything for her mood.    Expected Outcomes Short: Attend [facility] stress management education to decrease stress. Long: Maintain exercise Post [facility] to keep stress at a minimum.    Continue Psychosocial Services  Follow up required by staff          Psychosocial Re-Evaluation:  Psychosocial Re-Evaluation     Row Name 08/09/23 1125 09/01/23 1126 09/27/23 1144 11/08/23 1132       Psychosocial Re-Evaluation   Current issues with Current Sleep Concerns Current Stress Concerns Current Stress Concerns None Identified    Comments Bree states she isnt not  experiencing any anxiety depression or stress currently. Says she has a good support system of friends that have similar health issues. She says she has always struggled with sleep, some days she sleeps well others not well at all. She does take naps during the day. Days she exercises, she often takes a nap, but then struggles to sleep later in the night. Reviewed patient health questionnaire (PHQ-9) with patient for follow up. Previously, patients score indicated signs/symptoms of depression.  Reviewed to see if patient is improving symptom wise while in program.  Score improved and patient states that it is because she has been able to exercise more. Bree reports she is doing well with mental health as of  late. Says coming to rehab and exercising has helped her relief stress. Says she is sleeping well. She reports she has a good support system. Bree denies nay stress, anxiety, or depression. She says she has been sleeping fine at home, slept really well while traveling. She has mold in her bedroom window and is going to have it removed today with hopes she will sleep better.    Expected Outcomes STG: continue to focus on getting good rest from naps and sleep. LTG: maintain a positive outlook on health and daily life Short: Continue to attend [facility] regularly for regular exercise and social engagement. Long: Continue to improve symptoms and manage a positive mental state. STG: focus on good sleep hygiene, attend rehab for exercise and stress relief. LTG: Continue to improve symptoms and manage a positive mental state. STG: Focus on good sleep and attend rehab regularly. LTG: Continue to improve symptoms and manage a positive mental state.    Interventions Encouraged to attend Pulmonary Rehabilitation for the exercise Encouraged to attend Pulmonary Rehabilitation for the exercise Encouraged to attend Pulmonary Rehabilitation for the exercise Encouraged to attend Pulmonary Rehabilitation for the exercise     Continue Psychosocial Services  Follow up required by staff Follow up required by staff Follow up required by staff Follow up required by staff      Initial Review   Source of Stress Concerns -- -- Chronic Illness --       Psychosocial Discharge (Final Psychosocial Re-Evaluation):  Psychosocial Re-Evaluation - 11/08/23 1132       Psychosocial Re-Evaluation   Current issues with None Identified    Comments Bree denies nay stress, anxiety, or depression. She says she has been sleeping fine at home, slept really well while traveling. She has mold in her bedroom window and is going to have it removed today with hopes she will sleep better.    Expected Outcomes STG: Focus on good sleep and attend rehab regularly. LTG: Continue to improve symptoms and manage a positive mental state.    Interventions Encouraged to attend Pulmonary Rehabilitation for the exercise    Continue Psychosocial Services  Follow up required by staff          Education: Education Goals: Education classes will be provided on a weekly basis, covering required topics. Participant will state understanding/return demonstration of topics presented.  Learning Barriers/Preferences:  Learning Barriers/Preferences - 06/28/23 1027       Learning Barriers/Preferences   Learning Barriers None    Learning Preferences None          General Pulmonary Education Topics:  Infection Prevention: - Provides verbal and written material to individual with discussion of infection control including proper hand washing and proper equipment cleaning during exercise session. Flowsheet Row Pulmonary Rehab from 06/30/2023 in [facility] Re[facility]  Date 06/30/23  Educator MB  Instruction Review Code 1- Verbalizes Understanding    Falls Prevention: - Provides verbal and written material to individual with discussion of falls prevention and safety. Flowsheet Row Pulmonary Rehab from 06/30/2023 in [facility] and Pulmonary  Rehab  Date 06/30/23  Educator MB  Instruction Review Code 1- Verbalizes Understanding    Chronic Lung Disease Review: - Group verbal instruction with posters, models, PowerPoint presentations and videos,  to review new updates, new respiratory medications, new advancements in procedures and treatments. Providing information on websites and 800 numbers for continued self-education. Includes information about supplement oxygen, available portable oxygen systems, continuous and intermittent flow rates, oxygen safety,  concentrators, and Medicare reimbursement for oxygen. Explanation of Pulmonary Drugs, including class, frequency, complications, importance of spacers, rinsing mouth after steroid MDI's, and proper cleaning methods for nebulizers. Review of basic lung anatomy and physiology related to function, structure, and complications of lung disease. Review of risk factors. Discussion about methods for diagnosing sleep apnea and types of masks and machines for OSA. Includes a review of the use of types of environmental controls: home humidity, furnaces, filters, dust mite/pet prevention, HEPA vacuums. Discussion about weather changes, air quality and the benefits of nasal washing. Instruction on Warning signs, infection symptoms, calling MD promptly, preventive modes, and value of vaccinations. Review of effective airway clearance, coughing and/or vibration techniques. Emphasizing that all should Create an Action Plan. Written material given at graduation.   AED/CPR: - Group verbal and written instruction with the use of models to demonstrate the basic use of the AED with the basic ABC's of resuscitation.    Anatomy and Cardiac Procedures: - Group verbal and visual presentation and models provide information about basic cardiac anatomy and function. Reviews the testing methods done to diagnose heart disease and the outcomes of the test results. Describes the treatment choices: Medical Management,  Angioplasty, or Coronary Bypass Surgery for treating various heart conditions including Myocardial Infarction, Angina, Valve Disease, and Cardiac Arrhythmias.  Written material given at graduation.   Medication Safety: - Group verbal and visual instruction to review commonly prescribed medications for heart and lung disease. Reviews the medication, class of the drug, and side effects. Includes the steps to properly store meds and maintain the prescription regimen.  Written material given at graduation.   Other: -Provides group and verbal instruction on various topics (see comments)   Knowledge Questionnaire Score:  Knowledge Questionnaire Score - 11/24/23 1129       Knowledge Questionnaire Score   Post Score 15/18           Core Components/Risk Factors/Patient Goals at Admission:  Personal Goals and Risk Factors at Admission - 06/30/23 1622       Core Components/Risk Factors/Patient Goals on Admission    Weight Management Yes;Weight Loss    Intervention Weight Management: Develop a combined nutrition and exercise program designed to reach desired caloric intake, while maintaining appropriate intake of nutrient and fiber, sodium and fats, and appropriate energy expenditure required for the weight goal.;Weight Management: Provide education and appropriate resources to help participant work on and attain dietary goals.;Weight Management/Obesity: Establish reasonable short term and long term weight goals.;Obesity: Provide education and appropriate resources to help participant work on and attain dietary goals.    Admit Weight 165 lb 12.8 oz (75.2 kg)    Goal Weight: Short Term 158 lb 12.8 oz (72 kg)    Goal Weight: Long Term 151 lb 12.8 oz (68.9 kg)    Expected Outcomes Short Term: Continue to assess and modify interventions until short term weight is achieved;Long Term: Adherence to nutrition and physical activity/exercise program aimed toward attainment of established weight goal;Weight  Loss: Understanding of general recommendations for a balanced deficit meal plan, which promotes 1-2 lb weight loss per week and includes a negative energy balance of [phone] kcal/d;Understanding recommendations for meals to include 15-35% energy as protein, 25-35% energy from fat, 35-60% energy from carbohydrates, less than 200mg  of dietary cholesterol, 20-35 gm of total fiber daily;Understanding of distribution of calorie intake throughout the day with the consumption of 4-5 meals/snacks    Improve shortness of breath with ADL's Yes    Intervention Provide  education, individualized exercise plan and daily activity instruction to help decrease symptoms of SOB with activities of daily living.    Expected Outcomes Short Term: Improve cardiorespiratory fitness to achieve a reduction of symptoms when performing ADLs;Long Term: Be able to perform more ADLs without symptoms or delay the onset of symptoms    Hypertension Yes    Intervention Provide education on lifestyle modifcations including regular physical activity/exercise, weight management, moderate sodium restriction and increased consumption of fresh fruit, vegetables, and low fat dairy, alcohol  moderation, and smoking cessation.;Monitor prescription use compliance.    Expected Outcomes Short Term: Continued assessment and intervention until BP is < 140/68mm HG in hypertensive participants. < 130/37mm HG in hypertensive participants with diabetes, heart failure or chronic kidney disease.;Long Term: Maintenance of blood pressure at goal levels.    Lipids Yes    Intervention Provide education and support for participant on nutrition & aerobic/resistive exercise along with prescribed medications to achieve LDL 70mg , HDL >40mg .    Expected Outcomes Short Term: Participant states understanding of desired cholesterol values and is compliant with medications prescribed. Participant is following exercise prescription and nutrition guidelines.;Long Term:  Cholesterol controlled with medications as prescribed, with individualized exercise RX and with personalized nutrition plan. Value goals: LDL < 70mg , HDL > 40 mg.          Education:Diabetes - Individual verbal and written instruction to review signs/symptoms of diabetes, desired ranges of glucose level fasting, after meals and with exercise. Acknowledge that pre and post exercise glucose checks will be done for 3 sessions at entry of program.   Know Your Numbers and Heart Failure: - Group verbal and visual instruction to discuss disease risk factors for cardiac and pulmonary disease and treatment options.  Reviews associated critical values for Overweight/Obesity, Hypertension, Cholesterol, and Diabetes.  Discusses basics of heart failure: signs/symptoms and treatments.  Introduces Heart Failure Zone chart for action plan for heart failure.  Written material given at graduation.   Core Components/Risk Factors/Patient Goals Review:   Goals and Risk Factor Review     Row Name 08/09/23 1130 09/01/23 1116 09/27/23 1149 11/08/23 1136       Core Components/Risk Factors/Patient Goals Review   Personal Goals Review Weight Management/Obesity Improve shortness of breath with ADL's Improve shortness of breath with ADL's Improve shortness of breath with ADL's    Review Bree gained weight when being ill, she is now looking to lose some of that gained weight. She is more active than before but still unable to lose the weight. Encouraged her to increase workload as able and eat low caloire nutrient dense foods often. The weight loss might take some time as her physcial exercise is limited due to shortness of breath. Spoke to patient about their shortness of breath and what they can do to improve. Patient has been informed of breathing techniques when starting the program. Patient is informed to tell staff if they have had any med changes and that certain meds they are taking or not taking can be causing  shortness of breath. Pt reports her shortness of breath during ADLs has improved slightly and credits that improvement on the exercise done here. Encouraged her continue to exercise and prevent her shortness of breath from getting worse Bree reports her shortness of breath while traveling was slightly better, she walked more in [place] than she did here. She knows the importance of coming to pulmonary rehab to better her breathing for ADLs    Expected Outcomes STG: lose 5%  of body weight. LTG: acheve and maintain healthy weight Short: Attend [facility] regularly to improve shortness of breath with ADL's. Long: maintain independence with ADL's STG: Attend rehab and make exercise plan for post rehab. LTG: maintain independence with ADL's STG: Attend rehab and make exercise plan for post rehab. LTG: maintain independence with ADL's       Core Components/Risk Factors/Patient Goals at Discharge (Final Review):   Goals and Risk Factor Review - 11/08/23 1136       Core Components/Risk Factors/Patient Goals Review   Personal Goals Review Improve shortness of breath with ADL's    Review Bree reports her shortness of breath while traveling was slightly better, she walked more in [place] than she did here. She knows the importance of coming to pulmonary rehab to better her breathing for ADLs    Expected Outcomes STG: Attend rehab and make exercise plan for post rehab. LTG: maintain independence with ADL's          ITP Comments:  ITP Comments     Row Name 06/28/23 1025 06/30/23 1614 07/07/23 1138 07/14/23 0812 08/11/23 0819   ITP Comments Virtual Visit completed. Patient informed on EP and RD appointment and 6 Minute walk test. Patient also informed of patient health questionnaires on My Chart. Patient Verbalizes understanding. Visit diagnosis can [facility] found in [facility]09[facility]. Completed and gym orientation. Initial ITP created and sent for review to Dr. Faud Aleskerov, Medical Director. First full day of  exercise!  Patient was oriented to gym and equipment including functions, settings, policies, and procedures.  Patient's individual exercise prescription and treatment plan were reviewed.  All starting workloads were established based on the results of the 6 minute walk test done at initial orientation visit.  The plan for exercise progression was also introduced and progression will be customized based on patient's performance and goals. 30 Day review completed. Medical Director ITP review done, changes made as directed, and signed approval by Medical Director. Newer to Program 30 Day review completed. Medical Director ITP review done, changes made as directed, and signed approval by Medical Director.    Row Name 09/08/23 0941 10/06/23 0856 11/03/23 1032 11/24/23 1122     ITP Comments 30 Day review completed. Medical Director ITP review done, changes made as directed, and signed approval by Medical Director. 30 Day review completed. Medical Director ITP review done, changes made as directed, and signed approval by Medical Director. 30 Day review completed. Medical Director ITP review done, changes made as directed, and signed approval by Medical Director. Betheny graduated today from  rehab with 33 sessions completed.  Details of the patient's exercise prescription and what She needs to do in order to continue the prescription and progress were discussed with patient.  Patient was given a copy of prescription and goals.  Patient verbalized understanding. [place] plans to continue to exercise by attending fitness center in her community.       Comments: Discharge ITP

## 2023-11-24 NOTE — Progress Notes (Signed)
 Daily Session Note  Patient Details  Name: Deanna [name] MRN: [id number] Date of Birth: [birth year] Referring Provider:   Flowsheet Row Pulmonary Rehab from 06/30/2023 in [facility] Re[facility]  Referring Provider Deanna Domino, MD    Encounter Date: 11/24/2023  Check In:  Session Check In - 11/24/23 1120       Check-In   Supervising physician immediately available to respond to emergencies See telemetry face sheet for immediately available ER MD    Location [facility]-Cardiac & Pulmonary Rehab    Staff Present Rollene Paterson, MS, Exercise Physiologist;Jason Elnor RDN,LDN;Dailey Buccheri RN,BSN,MPA;Joseph Rolinda RCP,RRT,BSRT    Virtual Visit No    Medication changes reported     No    Fall or balance concerns reported    No    Tobacco Cessation No Change    Warm-up and Cool-down Performed on first and last piece of equipment    Resistance Training Performed Yes    VAD Patient? No    PAD/SET Patient? No      Pain Assessment   Currently in Pain? No/denies             Social History   Tobacco Use  Smoking Status Former   Current packs/day: 0.00   Average packs/day: 0.5 packs/day for 30.0 years (15.0 ttl pk-yrs)   Types: Cigarettes   Start date: 01/01/1968   Quit date: 12/31/1997   Years since quitting: 25.9  Smokeless Tobacco Never  Tobacco Comments   Quit in 1999    Goals Met:  Independence with exercise equipment Exercise tolerated well No report of concerns or symptoms today Strength training completed today  Goals Unmet:  Not Applicable  Comments:  Deanna [name] graduated today from  rehab with 33 sessions completed.  Details of the patient's exercise prescription and what She needs to do in order to continue the prescription and progress were discussed with patient.  Patient was given a copy of prescription and goals.  Patient verbalized understanding. Deanna [name] plans to continue to exercise by attending fitness center in her community.    Dr. Oneil Pinal  is Medical Director for [facility] Cardiac Rehabilitation.  Dr. Fuad Aleskerov is Medical Director for [facility] Pulmonary Rehabilitation.

## 2023-12-01 ENCOUNTER — Encounter: Payer: MEDICARE | Attending: Pulmonary Disease

## 2023-12-02 NOTE — Progress Notes (Signed)
 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 I got up and did some stretches and the seems to help too. Change in Status Since Last Visit: No Pain:    3-4/10  OBJECTIVE  General Observation/Objective Measures:       SABRASABRAThe patient arrives walking uanssisted and in no acute distress.         Interventions:   To address core muscle weakness and improve postural control and activity tolerance.   Exercise Body position Weights Reps/Sets/Hold time Hand hold  Treadmill stdg 1.0 mph 3 mins heel -toe gait 2  TA w/ ball squeeze supine none 10/2/5 sec hold none  TA w/ PPT supine none 10/1/5 sec none  TA w/ PPT  seated none 10/2/5 none  PPT w/ march seated none 10/2/3 none  BLE hip flexor stretch standing none 07/30/28 second hold 2                                          Education:  extensive verbal, visual and tactile cuing provided to engage the TA.  - Standing Hip Flexor Stretch  - 1-2 x daily - 1 sets - 3 reps - 30 hold ASSESSMENT  The patient could not effectively engage the TA in supine but could do this in sitting. Modified her HEP to perform seated TA exercise with march. Also added hip flexor stretch of BLE 's to reduce anterior tilt of the pelvis  and promote neutral spine posture while reducing lower back pain.   Therapy Diagnosis:     ICD-10-CM   1. Impaired functional mobility, balance, gait, and endurance  Z74.09   2. Decreased mobility and endurance  Z74.09   3. Weakness of right leg  R29.898   4. Decreased strength  R53.1     Progress Towards Goals:     Goals Addressed             This Visit's Progress   . COMPLETED: PT Goal       To be achieved by 10/01/23: 1. Pt will demonstrate/report >80% compliance with HEP to facilitate carryover between sessions and progress towards independence with management of condition.  07/08/2023  HEP initiated; unable to issue handout due to printer/internet difficulties; plan to issue next visit  09/07/2023: Met: client reports compliance with HEP 3 x week due to also attending pulmonary rehab   2. Pt will improve gait speed to 0.8 m/s or greater to increase safety with household and community mobility and decrease fall risk.  Gait speed: 0.38 m/s, no AD  07/08/2023  <1.67m/s indicates increased fall risk in the community             <1.85m/s indicates need for intervention to reduce fall risk <  0.59m/s indicates increased fall risk and risk of adverse events.   09/07/2023: improved but not yet met: but ambulating without right knee brace Gait speed .760m/s  09/21/2023: Met Gait speed 1.1 m/s no AD     3. Pt will improve Lower Extremity Functional Scale disability score by 15 points to increase functional mobility with decreased pain.  07/08/2023 PT Outcome Measures    Flowsheet Row Value  Lower Extremity Functional Scale (LEFS)   Any of your usual work, housework, or school activities 1  Your usual hobbies, recreational, or sporting activities 1  Getting into or out of the bath 1  Walking between rooms 2  Putting on your shoes or socks 1  Squatting 1  Lifting an object, like a bag of groceries from the floor 1  Performing light activities around your home 1  Performing  heavy activities around your home 1  Getting into or out of car 1  Walking 2 blocks 1  Walking a mile 0  Going up or down 10 stairs (about 1 flight of stairs) 1  Standing for 1 hour 1  Sitting for 1 hour 2  Running on even ground 0  Running on uneven ground 0  Making sharp turns while running fast 0  Hopping 0  Rolling over in bed 1  LEFS Raw Score 17 /80  Lower Extremity Functional Scale (LEFS) Score 21.25 /100  LE Functional Disability Score 78.75       MDIC scoring 9 points The lower the score the higher the disability High score of 80  09/07/2023:  improved but not met  LEFS raw score: 24  09/21/2023: Met: LEFS score raw 48  4. Patient will improve RLE MMT to 5/5 throughout to allow for improved safety with ambulation and decrease fall risk.  07/08/2023 LE (Lower extremity) Strength/MMT (manual muscle test)   Right Left  Hip flexion 4+ seated; SLR with minimal ext lag, able to lift RLE approx 2 inches from mat table, fatigues with 8 to 10 reps but able to perform 5  Hip Abduction NT NT  Hip Adduction NT NT  Knee extension NT;  LAQ with 20 deg lag; 10 deg lag with QS 5; LAQ with 10 deg lag; 0 deg with QS  Knee flexion NT 5  DF (dorsiflexion) NT 5   *Rt quad atrophy apparent Quad set: Rt poor; Lt good  09/07/2023: improved but not met  Supine R hip flex 4/5  Seated R quads 4/5  Seated R HS 4/5 -8 deg knee extension right   09/21/2023: Met Strength: Sitting right hip flexion 5/5 Sitting: right quads 5/5 Sitting: right hamstring 5/5 Sitting: AROM right knee extension: -3 deg from neutral       . PT Goal       .LTG: Patient will improve OSWESTRY score by 15% on the Oswestry Disability Questionnaire, demonstrating increased functional ability to facilitate patient in performing household chores, self-care activities, and help prevent further worsening of their condition by enhancing mobility, reducing pain, and promoting greater independence in daily activities  by 01-28-24 Plan of care extended to 01/28/2024 . PT Outcome Measures    Flowsheet Row Value  New Oswestry Disability Index (ODI)   Secion 1: Pain Intensity 2 *  Section 2: Personal Care (washing, dressing, etc.) 2 *  Section 3: Lifting 4 *  Section 4: Walking 2 *  Section 5: Sitting 0 *  Section 6: Standing 3 *  Section 7: Sleeping 3 *  Section 9:  Social Life 2 *  Section 10: Traveling 2 *  Oswestry Total Scored 20 *  Oswestry Total Possible Score 45 *  Oswestry Score Calculated 44.44 % *      11/03/2023:  . PT Outcome Measures    Flowsheet Row Value  New Oswestry Disability Index (ODI)   Secion 1: Pain Intensity 2  Section 2: Personal Care (washing, dressing, etc.) 2  Section 3: Lifting 3  Section 4: Walking 2  Section 5: Sitting 0  Section 6: Standing 3  Section 7: Sleeping 3  Section 9: Social Life 2  Section 10: Traveling 2  Oswestry Total Scored 19  Oswestry Total Possible Score 45  Oswestry Score Calculated 42.22 %      11-30-23 Oswestry Total Scored 14  Oswestry Total Possible Score 45  Oswestry Score Calculated 31.11 %        . PT Goal       .LTG: Patient will improve muscle strength to at least 4+/5 on the MMT scale in the hip, knee, and ankle joints bilaterally, to enhance functional mobility, stability, and overall functional capacity for activities of daily living and age-appropriate physical tasks by 01/28/24-care extended to 01/28/2024 Ongoing  11-30-23: Patient demonstrates weakness to right hip extension 4/5, hip add 4/5 otherwise 4+ for LE right and left.     . PT Goal       LTG: Patient will demonstrate improved bilateral hip flexor flexibility and reduced anterior pelvic tilt, as evidenced by a negative Thomas test, decreased pain (0/10 on NPRS), and improved posture during functional activities. 01/28/24 Plan of care extended to 01/28/2024  11/03/2023: Ongoing  11/30/23: Performed Debby test noted improved extension with mild hip and knee  elevation with position and reports decreased pain and tightness. Patient continues to demonstrate forward flexed trunk and posture and noted difficulty with standing hip flexor quad stretch.      . PT Goal       .Patient will report decrease in pain </= 3/10 to low back after am stretches or end of day to demonstrate ability manage pain. Target 01-28-24  11-30-23: 4-5/10 low back/ more on right posterior low back and gluteal     . PT Goal       Patient will report ability to ambulate >/= 1 mins or >/= 15 mins with normalized gait without reports of significant back pain for increased functional mobility. Target 01-28-24  11-30-23: Today Treadmill 1. x 5 mins; reports has with pulmonary program walked 15 mins but did have back pain afterwards.     . PT Goal       .Patient will demonstrate independence with HEP >/= 75% accuracy able to manage and maintain extensibility and strength independently. Target 01-28-24  11-30-23: provided patient with updated handout on exercises to assist with pain management such as am pain.           PLAN Treatment Frequency and Duration:  PT Frequency: 2x/week PT Duration: 6 weeks Treatment Plan Details: 2x week for additional 6 weeks POC end 01-28-24  Recommended PT Treatment/Interventions: Electrical stimulation-attended (02967); Gait training (02883); Neuromuscular re-education [phone]); Manual therapy (97140); Therapeutic activity (97530); Therapeutic massage [phone]); Electrical stimulation-unattended (02985); Self-care/home management [phone]); Ultrasound (02964); Therapeutic exercise [phone])   Recommended Consults:  None currently  Development of Plan of Care:  No change in POC.  Total Treatment Time (Time & Untimed): Total Treatment minutes: 42 Total Time in Timed Codes: Timed Code Minutes: 42     PT Treatment/Procedure  Therapeutic Exercises minutes: 42            The patient has been instructed to contact our clinic if any questions or  problems should arise.    *Some images could not be shown.

## 2023-12-06 ENCOUNTER — Other Ambulatory Visit (HOSPITAL_COMMUNITY): Payer: Self-pay

## 2023-12-06 ENCOUNTER — Ambulatory Visit: Payer: MEDICARE

## 2023-12-06 ENCOUNTER — Ambulatory Visit: Payer: MEDICARE | Attending: Internal Medicine | Admitting: Internal Medicine

## 2023-12-06 ENCOUNTER — Ambulatory Visit: Payer: MEDICARE | Attending: Genetic Counselor | Admitting: Genetic Counselor

## 2023-12-06 VITALS — BP 125/75 | HR 83 | Ht 62.0 in | Wt 166.0 lb

## 2023-12-06 DIAGNOSIS — I340 Nonrheumatic mitral (valve) insufficiency: Secondary | ICD-10-CM

## 2023-12-06 DIAGNOSIS — I480 Paroxysmal atrial fibrillation: Secondary | ICD-10-CM | POA: Diagnosis not present

## 2023-12-06 DIAGNOSIS — I422 Other hypertrophic cardiomyopathy: Secondary | ICD-10-CM

## 2023-12-06 DIAGNOSIS — I447 Left bundle-branch block, unspecified: Secondary | ICD-10-CM

## 2023-12-06 MED ORDER — METOPROLOL SUCCINATE ER 100 MG PO TB24
100.0000 mg | ORAL_TABLET | Freq: Every day | ORAL | 3 refills | Status: DC
  Filled 2023-12-06: qty 90, 90d supply, fill #0
  Filled 2024-02-25: qty 90, 90d supply, fill #1

## 2023-12-06 MED ORDER — METOPROLOL SUCCINATE ER 50 MG PO TB24
50.0000 mg | ORAL_TABLET | Freq: Every day | ORAL | 3 refills | Status: DC
  Filled 2023-12-06: qty 90, 90d supply, fill #0

## 2023-12-06 NOTE — Addendum Note (Signed)
 Addended by: RANDY HAMP SAILOR on: 12/06/2023 04:51 PM   Modules accepted: Orders

## 2023-12-06 NOTE — Progress Notes (Unsigned)
 Enrolled patient for a 14 day Zio XT  monitor to be mailed to patients home

## 2023-12-06 NOTE — Progress Notes (Signed)
 Cardiology Office Note:  .    Date:  12/06/2023  ID:  Erminio LABOR Aird, DOB 10/17/1945, MRN [id number] PCP: Delores Rojelio Caldron, NP  [facility] Providers Cardiologist:  None     CC: Follow up CMR Consulted for the evaluation of HCM at the behest of Dr. Liborio  History of Present Illness: .    Deanna [name] is a 78 y.o. female with hypertrophic nonobstructive cardiomyopathy who presents with worsening shortness of breath and recent cardiac MRI findings. She was referred by Dr. Elige for evaluation of her recent cardiac MRI findings.  She has a history of hypertrophic nonobstructive cardiomyopathy and recently underwent a cardiac MRI, which revealed significant late gadolinium enhancement and an inferior scar. Over the past week, she has experienced worsening shortness of breath, which has become chronic. Previously, shortness of breath occurred primarily when walking up stairs, but now it happens more frequently and with less exertion. No chest pain or pressure is noted, but she feels 'off' during episodes of atrial fibrillation, which have occurred twice in the past few months.  She recently completed a pulmonary rehabilitation program due to her diagnoses of COPD and asthma. During the program, she increased her walking distance from 600 feet to nearly 1000 feet in six minutes, although her performance varied day-to-day. Her medication regimen includes Eliquis  for paroxysmal atrial fibrillation, hydrochlorothiazide  and losartan  for blood pressure control, metoprolol  succinate to manage heart rate, and rosuvastatin  for cholesterol management. She also uses an inhaler as needed for her obstructive lung disease.  In her family history, there is no known history of hypertrophic cardiomyopathy or sudden cardiac death. 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  No episodes of passing out, but she feels lightheaded and  short of breath with minimal exertion. She experiences tingling in her arms and legs, more so in the past week, and has a history of carpal tunnel syndrome. She also reports chronic constipation.  Discussed the use of AI scribe software for clinical note transcription with the patient, who gave verbal consent to proceed.   Relevant histories: .  Social  - Sister: deceased at age 76 due to Leukemia - No family history of hypertrophic cardiomyopathy. ROS: As per HPI.   Studies Reviewed: .     Cardiac Studies & Procedures   ______________________________________________________________________________________________   STRESS TESTS  NM MYOCAR MULTI W/SPECT W 02/10/2023  Narrative CLINICAL DATA:  Chest pain  EXAM: MYOCARDIAL IMAGING WITH SPECT (REST AND PHARMACOLOGIC-STRESS)  GATED LEFT VENTRICULAR WALL MOTION STUDY  LEFT VENTRICULAR EJECTION FRACTION  TECHNIQUE: Standard myocardial SPECT imaging was performed after resting intravenous injection of 10.9 mCi Tc-27m tetrofosmin . Subsequently, intravenous infusion of Lexiscan  was performed under the supervision of the Cardiology staff. At peak effect of the drug, 32.7 mCi Tc-32m tetrofosmin  was injected intravenously and standard myocardial SPECT imaging was performed. Quantitative gated imaging was also performed to evaluate left ventricular wall motion, and estimate left ventricular ejection fraction.  COMPARISON:  None Available.  FINDINGS: Perfusion: No decreased activity in the left ventricle on stress imaging to suggest reversible ischemia or infarction.  Wall Motion: Normal left ventricular wall motion. Mild diastolic dilatation.  Left Ventricular Ejection Fraction: 56 %  End diastolic volume 112 ml  End systolic volume 49 ml  IMPRESSION: 1. No reversible ischemia or infarction.  2. Normal left ventricular wall motion.  Mild diastolic dilatation.  3. Left ventricular ejection fraction 56%  4.  Non  invasive risk stratification*: Low  *2012 Appropriate Use Criteria for Coronary Revascularization Focused Update: J Am Coll Cardiol. 2012;59(9):857-881. http://content.dementiazones.com.aspx?articleid=[email or web address]   Electronically Signed By: Waddell Calk M.D. On: 02/10/2023 14:23   ECHOCARDIOGRAM  ECHOCARDIOGRAM COMPLETE 02/02/2023  Narrative ECHOCARDIOGRAM REPORT    Patient Name:   Deanna [name] Date of Exam: 02/02/2023 Medical Rec #:  [id number]       Height:       61.0 in Accession #:    [id number]      Weight:       159.4 lb Date of Birth:  [birth year]       BSA:          1.715 m Patient Age:    76 years        BP:           140/62 mmHg Patient Gender: F               HR:           85 bpm. Exam Location:  [facility]  Procedure: 2D Echo, Cardiac Doppler and Color Doppler  Indications:     Dyspnea R06.00  History:         Patient has prior history of Echocardiogram examinations, most recent 09/09/2020. Risk Factors:Hypertension. LVH.  Sonographer:     Christopher Furnace Referring Phys:  [id number] DARRIN BARN Diagnosing Phys: Lonni Hanson MD  IMPRESSIONS   1. Left ventricular ejection fraction, by estimation, is 60 to 65%. The left ventricle has normal function. The left ventricle has no regional wall motion abnormalities. There is severe asymmetric left ventricular hypertrophy of the septal segment. Left ventricular diastolic parameters are consistent with Grade I diastolic dysfunction (impaired relaxation). 2. Right ventricular systolic function is normal. The right ventricular size is normal. 3. The mitral valve is normal in structure. Mild to moderate mitral valve regurgitation. No evidence of mitral stenosis. 4. The aortic valve is tricuspid. There is mild calcification of the aortic valve. There is mild thickening of the aortic valve. Aortic valve regurgitation is not visualized. Aortic valve sclerosis/calcification is present, without any evidence of aortic  stenosis.  Conclusion(s)/Recommendation(s): Hypertrophic cardiomyopathy is a consideration with asymmetric septal hypertrophy. Further evaluation could be performed with cardiac MRI, as clinically indicated.  FINDINGS Left Ventricle: Left ventricular ejection fraction, by estimation, is 60 to 65%. The left ventricle has normal function. The left ventricle has no regional wall motion abnormalities. The left ventricular internal cavity size was normal in size. There is severe asymmetric left ventricular hypertrophy of the septal segment. Left ventricular diastolic parameters are consistent with Grade I diastolic dysfunction (impaired relaxation).  Right Ventricle: The right ventricular size is normal. No increase in right ventricular wall thickness. Right ventricular systolic function is normal.  Left Atrium: Left atrial size was normal in size.  Right Atrium: Right atrial size was normal in size.  Pericardium: The pericardium was not well visualized.  Mitral Valve: The mitral valve is normal in structure. Mild to moderate mitral valve regurgitation. No evidence of mitral valve stenosis. MV peak gradient, 8.9 mmHg. The mean mitral valve gradient is 2.0 mmHg.  Tricuspid Valve: The tricuspid valve is not well visualized. Tricuspid valve regurgitation is trivial.  Aortic Valve: The aortic valve is tricuspid. There is mild calcification of the aortic valve. There is mild thickening of the aortic valve. Aortic valve regurgitation is not visualized. Aortic valve sclerosis/calcification is present, without any evidence of aortic stenosis. Aortic valve mean gradient measures 6.0  mmHg. Aortic valve peak gradient measures 8.8 mmHg. Aortic valve area, by VTI measures 3.24 cm.  Pulmonic Valve: The pulmonic valve was not well visualized. Pulmonic valve regurgitation is not visualized. No evidence of pulmonic stenosis.  Aorta: The aortic root is normal in size and structure.  Pulmonary Artery: The  pulmonary artery is not well seen.  Venous: The inferior vena cava was not well visualized.  IAS/Shunts: The interatrial septum was not well visualized.   LEFT VENTRICLE PLAX 2D LVIDd:         3.90 cm   Diastology LVIDs:         2.40 cm   LV e' medial:    6.20 cm/s LV PW:         0.90 cm   LV E/e' medial:  12.3 LV IVS:        2.40 cm   LV e' lateral:   5.55 cm/s LVOT diam:     2.00 cm   LV E/e' lateral: 13.8 LV SV:         93 LV SV Index:   54 LVOT Area:     3.14 cm   RIGHT VENTRICLE RV Basal diam:  3.10 cm RV Mid diam:    2.50 cm  LEFT ATRIUM             Index        RIGHT ATRIUM           Index LA diam:        2.80 cm 1.63 cm/m   RA Area:     11.60 cm LA Vol (A2C):   50.4 ml 29.39 ml/m  RA Volume:   20.50 ml  11.95 ml/m LA Vol (A4C):   50.9 ml 29.68 ml/m LA Biplane Vol: 52.3 ml 30.49 ml/m AORTIC VALVE AV Area (Vmax):    2.84 cm AV Area (Vmean):   2.89 cm AV Area (VTI):     3.24 cm AV Vmax:           148.00 cm/s AV Vmean:          113.000 cm/s AV VTI:            0.286 m AV Peak Grad:      8.8 mmHg AV Mean Grad:      6.0 mmHg LVOT Vmax:         134.00 cm/s LVOT Vmean:        104.000 cm/s LVOT VTI:          0.295 m LVOT/AV VTI ratio: 1.03  AORTA Ao Root diam: 3.00 cm  MITRAL VALVE                  TRICUSPID VALVE MV Area (PHT): 3.81 cm       TR Peak grad:   16.8 mmHg MV Area VTI:   2.09 cm       TR Vmax:        205.00 cm/s MV Peak grad:  8.9 mmHg MV Mean grad:  2.0 mmHg       SHUNTS MV Vmax:       1.49 m/s       Systemic VTI:  0.30 m MV Vmean:      67.7 cm/s      Systemic Diam: 2.00 cm MV Decel Time: 199 msec MR Peak grad:    80.6 mmHg MR Mean grad:    36.0 mmHg MR Vmax:         449.00 cm/s MR  Vmean:        263.0 cm/s MR PISA:         3.08 cm MR PISA Eff ROA: 21 mm MR PISA Radius:  0.70 cm MV E velocity: 76.50 cm/s MV A velocity: 137.00 cm/s MV E/A ratio:  0.56  Lonni Hanson MD Electronically signed by Lonni Hanson MD Signature  Date/Time: 02/02/2023/1:12:54 PM    Final        CARDIAC MRI  MR CARDIAC MORPHOLOGY W WO CONTRAST 09/29/2023  Narrative CLINICAL DATA:  Possible hypertrophic cardiomyopathy  EXAM: CARDIAC MRI  TECHNIQUE: The patient was scanned on a 1.5 Tesla GE magnet. A dedicated cardiac coil was used. Functional imaging was done using Fiesta sequences. 2,3, and 4 chamber views were done to assess for RWMA's. Modified Simpson's rule using a short axis stack was used to calculate an ejection fraction on a dedicated work [employer]. The patient received 8 cc of Gadavist . After 10 minutes inversion recovery sequences were used to assess for infiltration and scar tissue.  FINDINGS: Limited images of the lung fields showed no gross abnormalities.  Normal left ventricular size. Severe asymmetric basal septal and basal inferior hypertrophy. Diffuse mild hypokinesis, LV EF 46%. No mitral valve systolic anterior motion noted. Normal right ventricular size and systolic function, RV EF 50%. Mild left atrial enlargement, normal right atrium. Trileaflet aortic valve, visually no significant stenosis or regurgitation. Visually, no significant mitral regurgitation was noted.  DELAYED ENHANCEMENT IMAGING: DELAYED ENHANCEMENT IMAGING Confluent subendocardial late gadolinium enhancement (LGE) in the basal to mid anterior, anterolateral, inferolateral, and inferior walls. Mid-wall LGE throughout the mid anteroseptum and inferoseptum.  MEASUREMENTS: MEASUREMENTS LVEDV 95 mL  LVEDVi 52 mL/m2  LVSV 44 mL LVEF 46%  RVEDV 63 mL  RVEDVi 34 mL/m2 RVSV 31 mL RVEF 50%  Flow sequences not adequate.  Global T1 1128, ECV 37%  IMPRESSION: 1. Normal left ventricular size with severe asymmetric hypertrophy of the basal septum and basal inferior wall. Diffuse mild hypokinesis, LV EF 46%. No mitral valve systolic anterior motion noted.  2.  Normal RV size and systolic function,  RV EF 50%.  3. Noncoronary LGE pattern with confluent basal to mid anterior/anterolateral/inferolateral/inferior subendocardial LGE and mid-wall LGE throughout the anteroseptum and inferoseptum.  4.  Elevated extracellular volume percentage at 37%.  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 Hypertrophic cardiomyopathy is also on the differential given the asymmetric hypertrophy (though can see asymmetric hypertrophy with amyloidosis).  Dalton Mclean   Electronically Signed By: Ezra Shuck M.D. On: 09/29/2023 15:54   ______________________________________________________________________________________________       Physical Exam:    VS:  BP 125/75 (BP Location: Left Arm)   Pulse 83   Ht 5' 2 (1.575 m)   Wt 166 lb (75.3 kg)   SpO2 94%   BMI 30.36 kg/m    Wt Readings from Last 3 Encounters:  12/06/23 166 lb (75.3 kg)  11/22/23 166 lb 4.8 oz (75.4 kg)  08/20/23 168 lb 12.8 oz (76.6 kg)    Gen: no distress   Neck: No JVD Cardiac: No Rubs or Gallops, holosystolic standing murmur, RRR +2 radial pulses Respiratory: Clear to auscultation bilaterally, normal effort, normal  respiratory rate GI: Soft, nontender, non-distended  MS: No  edema;  moves all extremities Integument: Skin feels warm Neuro:  At time of evaluation,  alert and oriented to [place]/place/time/situation  Psych: Normal affect, patient feels Fair   ASSESSMENT AND PLAN: .    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 Symptoms include shortness of breath, which may overlap with cardiac symptoms. Currently using an inhaler as needed.Managed by [facility], and s/p pulmonary rehab  Family history, Discussed family screening  - Potential cardiac amyloidosis suggested by MRI findings and family history. Symptoms overlap with hypertrophic cardiomyopathy, making diagnosis challenging. Genetic testing will help differentiate between the two conditions. - Order genetic testing to assess for cardiac amyloidosis. - If genetic testing is inconclusive, consider a pyrophosphate scan   SCD  Assessment - LVEF listed as 46% but suboptimal gating; LVEF normal on last echo - Increase metoprolol  dose to 100 mg PO daily to slow heart rate and improve symptoms. - Schedule a stress test or stress echo to assess heart function and obstruction. - Order a two-week heart monitor to assess for NSVT burden  Atrial fibrillation Assessment (HCM-AF 18) - Order a two-week heart monitor to assess for atrial fibrillation as a cause of symptoms.  Medication symptom plan - Increase metoprolol  dose to 100 mg PO daily to slow heart rate and improve symptoms. - Order genetic testing to differentiate between hypertrophic cardiomyopathy and cardiac amyloidosis. - Schedule a stress test or stress echo to assess heart function and obstruction; bring back if inducible LVOT gradient - Provide educational resources on hypertrophic cardiomyopathy from the American Heart Association. 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 Discussed disease state education , using shared decision making tools and cardiac modeling, and genetic testing. Returned to room a second time to answer additional disease state questions.  Stanly Leavens, MD FASE [facility] Cardiologist [facility]  [address] [place], [address] [place], [place] [address] [phone]  10:24 AM

## 2023-12-06 NOTE — Patient Instructions (Signed)
 Medication Instructions:  Your physician has recommended you make the following change in your medication:  INCREASE: metoprolol  succinate to 100 mg by mouth once daily  *If you need a refill on your cardiac medications before your next appointment, please call your pharmacy*  Lab Work: NONE  If you have labs (blood work) drawn today and your tests are completely normal, you will receive your results only by: MyChart Message (if you have MyChart) OR A paper copy in the mail If you have any lab test that is abnormal or we need to change your treatment, we will call you to review the results.  Testing/Procedures: Your physician has requested that you have a stress echocardiogram. For further information please visit [email or web address] Please follow instructions as given.  Please note: We ask at that you not bring children with you during ultrasound (echo/ vascular) testing. Due to room size and safety concerns, children are not allowed in the ultrasound rooms during exams. Our front office staff cannot provide observation of children in our lobby area while testing is being conducted. An adult accompanying a patient to their appointment will only be allowed in the ultrasound room at the discretion of the ultrasound technician under special circumstances. We apologize for any inconvenience.     DO NOT TAKE YOUR ________N/A____________. Do not eat, drink or use tobacco products 4 hour prior to the test. Dress prepared to exercise in a comfortable, two piece clothing outfit and walking shoes. Do not wear any perfume, cologne, aftershave or lotion. Bring any current prescription medications with you the day of the test. Notify the office 24 hours in advance if you cannot keep this appointment. If you have any questions please call [phone].    Your physician has requested that you wear a Heart Monitor.    Follow-Up: At [facility], you and your health needs are our  priority.  As part of our continuing mission to provide you with exceptional heart care, our providers are all part of one team.  This team includes your primary Cardiologist (physician) and Advanced Practice Providers or APPs (Physician Assistants and Nurse Practitioners) who all work together to provide you with the care you need, when you need it.  Your next appointment:   3-4 month(s)  Provider:   Stanly Leavens, MD or Orren Fabry, PA  Other Instructions Deanna [name]- Long Term Monitor Instructions  Your physician has requested you wear a ZIO patch monitor for 14 days.  This is a single patch monitor. [employer] supplies one patch monitor per enrollment. Additional stickers are not available. Please do not apply patch if you will be having a Nuclear Stress Test,   Cardiac CT, MRI, or Chest Xray during the period you would be wearing the  monitor. The patch cannot be worn during these tests. You cannot remove and re-apply the  ZIO XT patch monitor.  Your ZIO patch monitor will be mailed 3 day USPS to your address on file. It may take 3-5 days  to receive your monitor after you have been enrolled.  Once you have received your monitor, please review the enclosed instructions. Your monitor  has already been registered assigning a specific monitor serial # to you.  Billing and Patient Assistance Program Information  We have supplied [employer] with any of your insurance information on file for billing purposes. [employer] offers a sliding scale Patient Assistance Program for patients that do not have  insurance, or whose insurance does not completely cover the cost of the  ZIO monitor.  You must apply for the Patient Assistance Program to qualify for this discounted rate.  To apply, please call [employer] at [phone], select option 4, select option 2, ask to apply for  Patient Assistance Program. Meredeth will ask your household income, and how many people  are in your household. They will quote  your out-of-pocket cost based on that information.  [employer] will also be able to set up a 52-month, interest-free payment plan if needed.  Applying the monitor   Shave hair from upper left chest.  Hold abrader disc by orange tab. Rub abrader in 40 strokes over the upper left chest as  indicated in your monitor instructions.  Clean area with 4 enclosed alcohol  pads. Let dry.  Apply patch as indicated in monitor instructions. Patch will be placed under collarbone on left  side of chest with arrow pointing upward.  Rub patch adhesive wings for 2 minutes. Remove white label marked 1. Remove the white  label marked 2. Rub patch adhesive wings for 2 additional minutes.  While looking in a mirror, press and release button in center of patch. A small green light will  flash 3-4 times. This will be your only indicator that the monitor has been turned on.  Do not shower for the first 24 hours. You may shower after the first 24 hours.  Press the button if you feel a symptom. You will hear a small click. Record Date, Time and  Symptom in the Patient Logbook.  When you are ready to remove the patch, follow instructions on the last 2 pages of Patient  Logbook. Stick patch monitor onto the last page of Patient Logbook.  Place Patient Logbook in the blue and white box. Use locking tab on box and tape box closed  securely. The blue and white box has prepaid postage on it. Please place it in the mailbox as  soon as possible. Your physician should have your test results approximately 7 days after the  monitor has been mailed back to [facility].  Call [facility] Customer Care at [phone] if you have questions regarding  your ZIO XT patch monitor. Call them immediately if you see an orange light blinking on your  monitor.  If your monitor falls off in less than 4 days, contact our Monitor department at [phone].  If your monitor becomes loose or falls off after 4 days call [employer] at  [phone] for  suggestions on securing your monitor

## 2023-12-07 ENCOUNTER — Encounter: Payer: MEDICARE | Admitting: Genetic Counselor

## 2023-12-07 ENCOUNTER — Telehealth: Payer: Self-pay | Admitting: Internal Medicine

## 2023-12-07 NOTE — Telephone Encounter (Signed)
 FYI - pt called in stating she does not wish to go forward with genetics testing. She states she may change her mind later on but would like to withdraw for now.

## 2023-12-09 NOTE — Progress Notes (Signed)
 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   Personal Medical Information Deanna [name] (II.5 on pedigree) is a 78 year-old [place] lady who reports noticing dyspnea with exertion at age 70 after undergoing treatment for non-Hodgkin's lymphoma. She was referred to a cardiologist and underwent an echocardiogram that did not detect any issues Later in 2023, she recollects being told of having a "thickening of her heart" by her cardiac oncologist but states that it was not considered to be an issue at that time. She was referred to a pulmonologist for her symptoms of shortness of breath and underwent pulmonary rehab that improved her symptoms. However, the symptoms resumes as soon as her rehab was over. So, she self-referred herself to a cardiologist and underwent cardiac imaging studies.  Her cardiac MRI (09/03/23) demonstrated severe asymmetric basal septal hypertrophy with LVEF of 46% and scarring pattern suspicious for cardiac amyloidosis but ECV of 37%.  She does report loss of sensation in her fingertips at 8 and underwent bilateral carpal tunnel surgery at 72. Denies having strokes. Also reports worsening shortness of breath that in the past occurred with exertion but now is evident at rest. Also notes having blurring of vision at time and a syncope event in Jan 2024 when she passed out while talking on the phone with her sister as she was walking up the one flight of stairs to her home. She did not seek medical care for this event as she gained consciousness within a minute and her sister had not realized that she had passed out.  Traditional Risk Factors Deanna [name] was diagnosed with HTN in her 41s and states that it is well controlled with medication.  Family history  Relation to Proband Pedigree # Current age Heart condition/age of onset Notes  Daughters, 2 III.5, III.6 57, 43 None Echo/EKG to be done   Grandchildren, 3 IV.1- IV.3 22, 16, 14 None         Sisters, 4 II.1- II.4 Deceased 85 None II.1- Died @ 10- leukemia II.2, II.4-Died soon after birth II.3- Echo/EKG to be done  Nephews, nieces III.1-III.4 Deceased 60, 40s None III.1- Died of MS @ 61 III.2- Died of stroke @ 67        Father I.1 Deceased None Died @ 42-leukemia  Mother I.2 Decesed None Died of pancreatic cancer @ 100   Genetics Deanna [name] was counseled on the genetics of hypertrophic cardiomyopathy (HCM). I explained to the patient that this is an autosomal dominant condition with incomplete penetrance i.e. not all individuals harboring the HCM mutation will present clinically with HCM, and age-related penetrance where clinical presentation of HCM increases with advanced age. Variability in clinical expression is also seen in families with HCM with affected family members presenting clinically at different ages and with symptoms ranging from mild to severe.  Since HCM is an autosomal dominant condition, first degree-relatives are at a 50% risk of inheriting this condition. They should seek regular surveillance for HCM.  First-degree relatives include her two daughters and sister. At this time her grandchildren and nephews do not need to undergo screening- they can be screened if they are symptomatic or if their parent is found to have HCM.    Clinical screening of first-degree relatives involves echocardiogram and EKG at regular intervals, frequency is typically determined by age, with children undergoing screening every year until the age of 67 and those over the age of 47 getting screened every  3-5 years until the age of 21. Patient verbalized understanding of this.  Also briefly discussed the inheritance pattern and treatment /management plans for the infiltrative cardiomyopathies that present as HCM phenocopies. Discussed the clinical presentation of patients with amyloidosis and touched upon the treatment strategies that are  deployed for this condition. About 8-10% of HCM patients can have compound and digenic sarcomeric mutations for HCM  Patient should be aware that genetic testing is a probabilistic test dependent upon age and severity of presentation, presence of risk factors for HCM and importantly family history of HCM or sudden death in first-degree relatives. The potential outcomes of genetic testing and subsequent management of at-risk family members is listed below-  If a mutation is not identified then, it is important that he understands that HCM is a genetic condition and can be passed down to his children. All first-degree relatives should undergo regular screening for HCM.  A negative test result can be due to limitations of the genetic test.   There is also the likelihood of identifying a "Variant of unknown significance". This result means that the variant has not been detected in a statistically significant number of HCM patients and/or functional studies have not been performed to verify its pathogenicity. This VUS can be tested in the family to see if it segregates with disease. If a VUS is found, first-degree relatives should undergo regular clinical screening for HCM, but genetic testing for the VUS is otherwise not warranted.  If a pathogenic variant is reported, then first-degree family members can get tested for this variant. If they test positive, it is likely they will develop HCM. In light of variable expression and incomplete penetrance associated with HCM, it is not possible to predict when they will manifest clinically with HCM. It is recommended that family members that test positive for the familial pathogenic variant pursue clinical screening for HCM. Family members that test negative for the familial mutation need not pursue periodic screening for HCM, but seek care if symptoms develop.   Impression  Deanna [name] was found to have cardiac wall thickness suggestive of HCM at age 37 in the absence  of other cardiac loading conditions that can lead to cardiac hypertrophy. There is no family history of HCM or sudden death. 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  Genetic testing is recommended to confirm her diagnosis. This test should include the major sarcomeric genes involved in HCM, namely MYBPPC3, MYH7, TNNI3, TNNT2, TPM1, ACTC1, MYL2 and MYL3. It should also include the genes involved in HCM phenocopies as cardiac-predominant forms of these conditions present clinically as HCM. These include genes for Fabry disease (GLA), Danon disease (LAMP2), WPW syndrome (PRKAG2), Familial transthyretin amyloidosis (TTR) and phospholamban (PLN).  In addition, patient should be aware of protections afforded by the Genetic Information Non-Discrimination Act (GINA). GINA protects a patient from losing their employment or health insurance based on their genotype. However, these protections do not cover life insurance and disability. Explained to the patient that family members that are found to have the familial genetic mutation will be denied life insurance even if they are asymptomatic and do not exhibit clinical signs of HCM. She verbalized understanding and will discuss this with her children.   Please note that the patient has not been counseled in this visit on other personal, cultural or ethical issues that she  may face due to her heart condition.   Plan After a thorough discussion of the risk and benefits of genetic testing for HCM, Deanna [name] defers genetic testing due to insurance non-coverage for her HCM test. She is interested in pursuing testing for familial transthyretin amyloidosis to rule it out but wants to discuss this with her children before she proceeds. She has been scheduled to see me next week to pursue testing for familial transthyretin amyloidosis but cancelled the appointment  the next day.     Danford Pac, Ph.D, [facility] Clinical Molecular Geneticist

## 2023-12-09 NOTE — Addendum Note (Signed)
 Addended by: RANDY HAMP SAILOR on: 12/09/2023 03:58 PM   Modules accepted: Orders

## 2023-12-13 ENCOUNTER — Encounter: Payer: MEDICARE | Admitting: Genetic Counselor

## 2023-12-24 ENCOUNTER — Encounter: Payer: Self-pay | Admitting: Internal Medicine

## 2023-12-24 ENCOUNTER — Telehealth: Payer: Self-pay | Admitting: Internal Medicine

## 2023-12-24 NOTE — Telephone Encounter (Signed)
 Patient identification verified by 2 forms.   Called and spoke to patient  Patient states:  -She has had 4 episodes I the past 2 months, 2 in the last episode.  -It last about 1 minute.  -Pt currently wearing a heart monitor will remove Sunday.  -Pt is very upset because I am not getting the proper care.  -Pt and her daughter want to know can a live monitor be ordered instead of the current monitor.  Patient denies:  -Feeling any better since being seen.    Interventions/Plan: -Pt will contact her insurance  to see if they will cover an additional cardiac monitor.    Patient agrees with plan, no questions at this time

## 2023-12-24 NOTE — Telephone Encounter (Signed)
 Pt c/o Shortness Of Breath: STAT if SOB developed within the last 24 hours or pt is noticeably SOB on the phone  1. Are you currently SOB (can you hear that pt is SOB on the phone)?   No  2. How long have you been experiencing SOB?  Started about a month or 2 ago  3. Are you SOB when sitting or when up moving around?  Mostly when laying down  4. Are you currently experiencing any other symptoms?   Lightheadedness, blurry vision   Patient stated she has been having episodes of SOB while laying down.  Patient stated she has had 2 episodes this past week at 3:03 am this morning and last Friday while at PT.

## 2023-12-30 ENCOUNTER — Ambulatory Visit (HOSPITAL_COMMUNITY)
Admission: RE | Admit: 2023-12-30 | Discharge: 2023-12-30 | Disposition: A | Discharge status: Home / Self Care | Payer: MEDICARE | Source: Ambulatory Visit | Attending: Cardiology | Admitting: Cardiology

## 2023-12-30 ENCOUNTER — Ambulatory Visit (HOSPITAL_COMMUNITY): Admission: RE | Admit: 2023-12-30 | Payer: MEDICARE | Source: Ambulatory Visit

## 2023-12-30 DIAGNOSIS — I422 Other hypertrophic cardiomyopathy: Secondary | ICD-10-CM | POA: Diagnosis present

## 2023-12-30 LAB — ECHOCARDIOGRAM STRESS TEST
AR max vel: 3.01 cm2
AV Area VTI: 3.46 cm2
AV Area mean vel: 2.94 cm2
AV Mean grad: 6 mmHg
AV Peak grad: 10.5 mmHg
Ao pk vel: 1.62 m/s
Area-P 1/2: 2.76 cm2
MV M vel: 4.73 m/s
MV Peak grad: 89.5 mmHg
Radius: 0.6 cm
S' Lateral: 2.1 cm

## 2024-01-04 ENCOUNTER — Ambulatory Visit: Payer: MEDICARE | Admitting: Nurse Practitioner

## 2024-01-06 ENCOUNTER — Ambulatory Visit: Payer: Self-pay

## 2024-01-06 ENCOUNTER — Telehealth: Payer: Self-pay | Admitting: Internal Medicine

## 2024-01-06 DIAGNOSIS — I422 Other hypertrophic cardiomyopathy: Secondary | ICD-10-CM

## 2024-01-06 DIAGNOSIS — I517 Cardiomegaly: Secondary | ICD-10-CM

## 2024-01-06 NOTE — Telephone Encounter (Signed)
 Pt reports continues to have SOB although better than was previously.  Reports has an OV with Dr. Malachy Pulmonology on 01/11/24.  Will send a message to Provider to see if can complete PFT.    Pt also reports Oncologist mentioned having pt complete a PYP Scan.  Advised pt will reach out to Dr. Norleen Debby Funk office to see if testing has been ordered.  Called Dr. Funk office left a message with triage nurse Rumalda regarding concern.  Nurse will send message to MD and call back once has an answer. Called pt advised will not order PYP scan until I have heard back from Dr. Venda office.  Pt reports would like to have testing completed through [facility] and to order testing.  I have advised pt I will notify Dr. Venda office of this information when they call back.

## 2024-01-06 NOTE — Telephone Encounter (Signed)
 [facility] called to report end of service report of 2 arrhythmias; one back to back pause: One pause of 3.6 second and the other 9.3 seconds at 2:35 AM, seen on strip 7 page 35 and 36. Second arrhythmia was symptomatic high grade 2nd degree AV block, HR of 28 bpm for 2.3 seconds seen on strip 5, page 34 and 35. Will forward to ordering doctor and covering nurse for their review.

## 2024-01-06 NOTE — Telephone Encounter (Signed)
 [facility] with abnormal test results.

## 2024-01-07 NOTE — Telephone Encounter (Signed)
 Called pt advised of MD recommendation: Reviewed rhythm strips with Dr. Waddell. Patient has had sustained VT up to 29 beats as well as 9 seconds of complete heart block during sleep with some complete heart block while awake as well. Please find out if patient is symptomatic. Dr. Adrian nurse is getting patient scheduled to see him next Tuesday otherwise if no symptoms. If she develops syncope over the weekend she needs to call 911 to go to the emergency room immediately  Pt reports was feeling lightheaded, dizzy and like she would pass out at times about a month ago.  Reports has lightheadedness with position changes but has started to move slower with position change.  Advised pt if symptoms increase to go to ED.  Advised Monitor results are in the system and treatment plan can be developed at ED if needed.  OV with EP (Dr. Waddell) scheduled for 01/13/24 at 12:15. Pt expresses understanding all questions answered.

## 2024-01-10 NOTE — Telephone Encounter (Signed)
 Patient is following up requesting updates on correspondence from pulmonology and oncology. She wants to confirm they are both agreeable with PFT prior to 8/12 appointment.   Patient also mentions she will go in for a routine dental cleaning soon and with like to know if she should proceed with appointment given her current state. She says dental office doesn't require any clearance, she just wants to know for herself.

## 2024-01-10 NOTE — Telephone Encounter (Signed)
 Called pt advised I have not heard back from Oncology.  Cobb, NP (Pulmonology) sent a message expressing pt had a PFT less than a year ago.  Would like to know if pt symptoms have worsened.    Pt reports feels about the same.  Advised pt to address concern for PFT at upcoming OV-01/11/24.

## 2024-01-10 NOTE — Telephone Encounter (Signed)
 Dr. Fernande returning call

## 2024-01-11 ENCOUNTER — Ambulatory Visit (INDEPENDENT_AMBULATORY_CARE_PROVIDER_SITE_OTHER): Payer: MEDICARE | Admitting: Nurse Practitioner

## 2024-01-11 ENCOUNTER — Ambulatory Visit
Admission: RE | Admit: 2024-01-11 | Discharge: 2024-01-11 | Disposition: A | Discharge status: Home / Self Care | Payer: MEDICARE | Source: Ambulatory Visit | Attending: Nurse Practitioner | Admitting: Nurse Practitioner

## 2024-01-11 ENCOUNTER — Telehealth: Payer: Self-pay

## 2024-01-11 ENCOUNTER — Encounter: Payer: Self-pay | Admitting: Nurse Practitioner

## 2024-01-11 ENCOUNTER — Telehealth: Payer: Self-pay | Admitting: Internal Medicine

## 2024-01-11 ENCOUNTER — Ambulatory Visit: Payer: Self-pay | Admitting: Nurse Practitioner

## 2024-01-11 VITALS — BP 122/64 | HR 61 | Temp 97.1°F | Ht 62.0 in | Wt 166.6 lb

## 2024-01-11 DIAGNOSIS — J449 Chronic obstructive pulmonary disease, unspecified: Secondary | ICD-10-CM | POA: Insufficient documentation

## 2024-01-11 DIAGNOSIS — I472 Ventricular tachycardia, unspecified: Secondary | ICD-10-CM

## 2024-01-11 DIAGNOSIS — R0602 Shortness of breath: Secondary | ICD-10-CM

## 2024-01-11 DIAGNOSIS — J309 Allergic rhinitis, unspecified: Secondary | ICD-10-CM | POA: Diagnosis not present

## 2024-01-11 DIAGNOSIS — I499 Cardiac arrhythmia, unspecified: Secondary | ICD-10-CM

## 2024-01-11 DIAGNOSIS — R0609 Other forms of dyspnea: Secondary | ICD-10-CM

## 2024-01-11 DIAGNOSIS — R058 Other specified cough: Secondary | ICD-10-CM

## 2024-01-11 DIAGNOSIS — I480 Paroxysmal atrial fibrillation: Secondary | ICD-10-CM

## 2024-01-11 DIAGNOSIS — K219 Gastro-esophageal reflux disease without esophagitis: Secondary | ICD-10-CM

## 2024-01-11 HISTORY — DX: Chronic obstructive pulmonary disease, unspecified: J44.9

## 2024-01-11 HISTORY — DX: Cardiac arrhythmia, unspecified: I49.9

## 2024-01-11 LAB — NITRIC OXIDE: Nitric Oxide: 17

## 2024-01-11 MED ORDER — BREZTRI AEROSPHERE 160-9-4.8 MCG/ACT IN AERO: 2.0000 | INHALATION_SPRAY | Freq: Two times a day (BID) | RESPIRATORY_TRACT | 0 refills | Status: DC

## 2024-01-11 MED ORDER — BREZTRI AEROSPHERE 160-9-4.8 MCG/ACT IN AERO: 2.0000 | INHALATION_SPRAY | Freq: Two times a day (BID) | RESPIRATORY_TRACT | 11 refills | Status: DC

## 2024-01-11 MED ORDER — OMEPRAZOLE 40 MG PO CPDR: 40.0000 mg | DELAYED_RELEASE_CAPSULE | Freq: Every day | ORAL | 1 refills | Status: DC

## 2024-01-11 NOTE — Telephone Encounter (Signed)
 Informed patient, the message will be sent  to Dr  Waddell and Santo.    Patient is aware that Dr Waddell may not be able to do a letter that is needed and Dr Emilee is not in the office this week.   Patient states she  was referred by Dr Madireddy, she states she will also call him as well

## 2024-01-11 NOTE — Telephone Encounter (Signed)
 Pt called in to ask if she can have a letter of accomodation stating she can no longer go up the stairs for a second floor apartment. Pt has an appt with Dr. Waddell on Thursday if she can pick it up that day. Please advise.   [facility] [address]

## 2024-01-11 NOTE — Assessment & Plan Note (Addendum)
 Symptoms of GERD, possibly contributing to cough/poor COPD/asthma management. Start PPI with omeprazole . Reviewed side effect profile. Reassess after 6 weeks for improvement. GERD precautions reviewed.

## 2024-01-11 NOTE — Progress Notes (Addendum)
 @Patient  ID: Deanna [name], female    DOB: 1946-03-08, 78 y.o.   MRN: [id number]  Chief Complaint  Patient presents with   Follow-up    SOB. Wheezing. Cough with thick clear sputum mostly after she eats.    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 She is a patient of Dr. Nelda and last seen in office 06/09/2023. 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   TEST/EVENTS:  12/12/2022 CTA chest: no PE. Mild emphysema. Age indeterminate T10 compression fx. Subacute or chronic sixth right rib fx 02/09/2023 PFT: FVC 69, FEV1 68, ratio 72, TLC 101, DLCOunc 73 corrects to 88 for alveolar volume. No BD. Moderate obstruction  09/29/2023 MR cardiac: EF 46%, diffuse mild hypokinesis; normal RV function and size 12/30/2023 stress echo: no stress-induced outflow obstrucion; EF 75% with peak 80%, mild MR  06/09/2023: OV with Dr. Malka. Prior consult for DOE x 6 months. Mostly exertional. No associated symptoms. Prior CTA chest 11/2022 negative for any lung parenchyma abnormalities or PE. PFT with moderate COPD. Echo with GIDD, severe hypertrophy. Nuclear stress testing without any significant signs of ischemia. Start Symbicort  160 mcg Twice daily. Refer to pulmonary rehab. F/u 6 months.  01/11/2024: Today - follow up Discussed the use of AI scribe software for clinical note transcription with the patient, who gave verbal consent to proceed.  History of Present Illness Deanna [name] is a 78 year old female with moderate COPD who presents with worsening respiratory symptoms. She was referred by her heart doctor for evaluation of her respiratory symptoms.  Her respiratory symptoms have  worsened since her last lung function testing in September of the previous year. She experiences increased difficulty breathing with most activities, and particularly when lying flat at night. She has occasional wheezing. She uses albuterol  as a rescue inhaler, which provides relief. She has been using Incruse, two puffs once a day, and was under the impression that Symbicort  was replaced by Incruse. She has not been using Symbicort . Still using rescue a few times a day.   She experiences a cough that worsens after eating. She occasionally gags with the cough/mucus and has difficulty catching her breath. Mucus is clear. She doesn't necessarily feel like she has heartburn but isn't sure. She is not currently taking any medication for reflux but uses a probiotic. She has been using Flonase intermittently and nasal rinses for nasal congestion and takes Claritin  for allergies, which were previously identified as dust-related. Has not had allergy testing in quite some time.   She has a history of moderate COPD.   She recently wore a cardiac monitor which showed sustained runs of ventricular tachycardia and intermittent complete heart block. Her heart rate drops significantly during sleep as well. She is waiting on a visit with Dr. Waddell from EP 8/14. She has occasional lightheadedness/dizziness but feels like this is better than it was a month ago. No CP, syncope, palpitations.   Her recent blood work showed slightly elevated eosinophils at 200. No fever, hemoptysis, or leg swelling. No weight loss or anorexia.   She is on Eliquis ; denies any missed doses. No bloody stools, excessive bruising. Recent hgb was normal.   FeNO 17 ppb  Allergies  Allergen Reactions   Codeine Nausea And Vomiting and Palpitations     Nausea and/or Vomiting, chest pain and acid reflux Feels like she is having a heart attack.     Gabapentin  Other (See Comments)    Eyes went numb and blurry  Blurred Vision as well    Gluten Meal Diarrhea and Other (See Comments)    Severe diarrhea per member    Dust Mite Extract     Upper Respiratory issues    Sulfa Antibiotics Other (See Comments)   Terbinafine Hcl Rash    Immunization History  Administered Date(s) Administered   Influenza Split 05/20/2006   Influenza, High Dose Seasonal PF 05/20/2006   Influenza-Unspecified 05/20/2006   PFIZER Comirnaty(Gray Top)Covid-19 Tri-Sucrose Vaccine 08/15/2019, 09/05/2019, 07/20/2020   PFIZER(Purple Top)SARS-COV-2 Vaccination 08/15/2019, 07/20/2020   Pneumococcal Conjugate-13 05/31/2013, 05/31/2013   Pneumococcal Polysaccharide-23 05/08/2016   Td 03/10/2004   Td (Adult),5 Lf Tetanus Toxid, Preservative Free 03/10/2004   Tdap 03/10/2004, 04/13/2014, 04/13/2014    Past Medical History:  Diagnosis Date   A-fib (HCC)    Abnormal glucose 03/06/2020   Acute coronary syndrome (HCC) 02/09/2023   Age-related osteoporosis without current pathological fracture 11/22/2015   Overview:  Femoral neck  Formatting of this note might be different from the original. 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 Added automatically from request for surgery [id number]   Chronic pain syndrome 04/01/2015   Colon polyps    Dyspnea    Essential hypertension 07/20/2000   Overview:  HBP   Family history of adverse reaction to anesthesia    mother delerium   Female cystocele 02/27/2009   GAD (generalized anxiety disorder) 08/19/2015   GERD (gastroesophageal reflux disease)    Heart murmur    History of Bell's palsy 03/21/2015   Hx of adenomatous colonic polyps 12/20/2008    Overview:  Colonoscopy 07/2003 - [facility] - polyps removed - recommended repeat in 3 years Colonoscopy 09/2011 - Dr. Brenita - hyperplastic polyp(s) were removed - repeat in 5 years   Hypertension    Incomplete uterovaginal prolapse 03/04/2004   Insomnia    Left bundle-branch block 05/20/2006   Lumbar radiculopathy 01/09/2016   Formatting of this note might be different from the original. 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 Femoral neck   Other intervertebral disc degeneration, lumbar region 01/29/2015   Pre-operative cardiovascular examination 09/03/2020   Primary osteoarthritis, right hand 02/28/2002   Overview:  OA HANDS, ESP THUMBS   Radial tunnel syndrome, right 11/04/2016   Radiculopathy, lumbar region 11/22/2014   Overview:  Added automatically from request for surgery [id number]   Rhinitis, allergic 02/24/2002   Overview:  ALLERGY SYMPTOMS - states was seen by allergist in past and informed allergic to dust   S/P left total hip arthroplasty  09/17/2020   S/P total right hip arthroplasty 02/18/2023   Sacroiliitis (HCC) 08/02/2019   Screening for colon cancer 04/17/2005   Formatting of this note might be different from the original. 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 TRIGGER THUMBS -  started spring 2003. Injected 02/28/02   Unspecified asthma, uncomplicated 12/25/2004    Tobacco History: Social History   Tobacco Use  Smoking Status Former   Current packs/day: 0.00   Average packs/day: 0.5 packs/day for 30.0 years (15.0 ttl pk-yrs)   Types: Cigarettes   Start date: 01/01/1968   Quit date: 12/31/1997   Years since quitting: 26.0  Smokeless Tobacco Never  Tobacco Comments   Quit in 1999   Counseling given: Not Answered Tobacco comments: Quit in 1999   Outpatient Medications Prior to Visit  Medication Sig Dispense Refill   albuterol  (VENTOLIN  HFA) 108 (90 Base) MCG/ACT inhaler Inhale 2 puffs into the lungs every 6 (six) hours as needed for wheezing or shortness of breath. 8 g 2   apixaban  (ELIQUIS ) 5 MG TABS tablet Take 1 tablet (5 mg total) by mouth 2 (two) times daily. Hold medication from 9/15 to 9/21 for right hip surgery 180 tablet 1   Ascorbic Acid  (VITAMIN C ) 1000 MG tablet Take 4,000 mg by mouth daily.     Calcium -Magnesium  (CAL-MAG PO) Take 2,000 mg by mouth at bedtime. 1000 mg     Carboxymethylcellul-Glycerin  (CLEAR EYES FOR DRY EYES OP) Place 1 drop into both eyes daily.     fluorouracil (EFUDEX) 5 % cream Apply 1 Application topically 2 (two) times daily.     hydrochlorothiazide  (HYDRODIURIL ) 25 MG tablet Take 1 tablet (25 mg total) by mouth 2 (two) times daily. 180 tablet 3   hydrocortisone  2.5 % cream Apply topically as needed (hemmeroids).     imiquimod (ALDARA) 5 % cream as needed (rash).     loratadine  (CLARITIN ) 10 MG tablet Take 10 mg by mouth daily as needed for allergies.     losartan  (COZAAR ) 50 MG tablet TAKE 1 TABLET BY MOUTH EVERY DAY 90 tablet 1   metoprolol  succinate (TOPROL -XL) 100 MG 24 hr tablet Take 1 tablet (100 mg total) by mouth daily. Take with or immediately following a meal. 90 tablet 3   milk thistle 175 MG tablet Take 175 mg by mouth daily.     nitroGLYCERIN  (NITROSTAT ) 0.4 MG SL tablet Place 1 tablet (0.4 mg total) under the  tongue every 5 (five) minutes x 3 doses as needed for chest pain. 25 tablet 1   Probiotic Product (PROBIOTIC DAILY PO) Take 1 tablet by mouth daily.     rosuvastatin  (CRESTOR ) 20 MG tablet Take 1 tablet (20 mg total) by mouth daily. 30 tablet 6   triamcinolone cream (KENALOG) 0.1 % Apply topically as needed (rash).     Vitamin A  2400 MCG (8000 UT) CAPS Take 16,000 Units by mouth daily. 8000 units     Vitamin D, Ergocalciferol, (DRISDOL) 1.25 MG (50000 UNIT) CAPS capsule Take 50,000 Units by mouth once a week.     zolpidem  (AMBIEN ) 5 MG tablet Take 5 mg by mouth at bedtime.  umeclidinium bromide  (INCRUSE ELLIPTA ) 62.5 MCG/ACT AEPB Inhale 1 puff into the lungs daily. 1 each 6   budesonide -formoterol  (SYMBICORT ) 160-4.5 MCG/ACT inhaler Inhale 2 puffs into the lungs in the morning and at bedtime. (Patient not taking: Reported on 01/11/2024) 1 each 12   No facility-administered medications prior to visit.     Review of Systems: As above    Physical Exam:  BP 122/64 (BP Location: Right Arm, Cuff Size: Normal)   Pulse 61   Temp (!) 97.1 F (36.2 C)   Ht 5' 2 (1.575 m)   Wt 166 lb 9.6 oz (75.6 kg)   SpO2 96%   BMI 30.47 kg/m   GEN: Pleasant, interactive, well-appearing; obese; in no acute distress HEENT:  Normocephalic and atraumatic. PERRLA. Sclera white. Nasal turbinates pale, moist and patent bilaterally. No rhinorrhea present. Oropharynx pink and moist, without exudate or edema. No lesions, ulcerations, or postnasal drip.  NECK:  Supple w/ fair ROM. No JVD present. Normal carotid impulses w/o bruits. Thyroid  symmetrical with no goiter or nodules palpated. Mild cervical lymphadenopathy.   CV: RRR, no m/r/g, no peripheral edema. Pulses intact, +2 bilaterally. No cyanosis, pallor or clubbing. PULMONARY:  Unlabored, regular breathing. Diminished bibasilar airflow otherwise clear bilaterally A&P w/o wheezes/rales/rhonchi. No accessory muscle use.  GI: BS present and normoactive. Soft,  non-tender to palpation.  MSK: No erythema, warmth or tenderness. Cap refil <2 sec all extrem.  Neuro: A/Ox3. No focal deficits noted.   Skin: Warm, no lesions or rashe Psych: Normal affect and behavior. Judgement and thought content appropriate.     Lab Results:  CBC    Component Value Date/Time   WBC 8.6 06/24/2023 1336   RBC 4.93 06/24/2023 1336   HGB 13.5 06/24/2023 1336   HGB 13.6 04/03/2021 0825   HGB 11.5 10/07/2020 1130   HCT 40.5 06/24/2023 1336   HCT 34.0 10/07/2020 1130   PLT 293 06/24/2023 1336   PLT 260 04/03/2021 0825   PLT 368 10/07/2020 1130   MCV 82.2 06/24/2023 1336   MCV 82 10/07/2020 1130   MCH 27.4 06/24/2023 1336   MCHC 33.3 06/24/2023 1336   RDW 16.1 (H) 06/24/2023 1336   RDW 13.1 10/07/2020 1130   LYMPHSABS 1.4 12/12/2022 1846   MONOABS 1.1 (H) 12/12/2022 1846   EOSABS 0.2 12/12/2022 1846   BASOSABS 0.1 12/12/2022 1846    BMET    Component Value Date/Time   NA 135 06/24/2023 1336   NA 142 03/06/2020 1706   K 3.4 (L) 06/24/2023 1336   CL 100 06/24/2023 1336   CO2 24 06/24/2023 1336   GLUCOSE 114 (H) 06/24/2023 1336   BUN 18 06/24/2023 1336   BUN 12 03/06/2020 1706   CREATININE 0.77 06/24/2023 1336   CREATININE 0.69 04/03/2021 0825   CALCIUM  10.0 06/24/2023 1336   GFRNONAA >60 06/24/2023 1336   GFRNONAA >60 04/03/2021 0825   GFRAA 104 03/06/2020 1706    BNP    Component Value Date/Time   BNP 84.7 06/24/2023 1415     Imaging:  ECHOCARDIOGRAM STRESS TEST Result Date: 12/30/2023     EXERCISE STRESS ECHO REPORT    -------------------------------------------------------------------------------- Patient Name:   Alaija Ruble Fryer Date of Exam: 12/30/2023 Medical Rec #:  [id number]       Height:       62.0 in Accession #:    [id number]      Weight:       166.0 lb Date of Birth:  Mar 26, 1946  BSA:          1.766 m Patient Age:    77 years        BP:           140/80 mmHg Patient Gender: F               HR:           67 bpm. Exam Location:   [address] Procedure: Limited Echo, Stress Echo, Cardiac Doppler and Limited Color Doppler Indications:    I42.2 HOCM  History:        Patient has prior history of Echocardiogram examinations, most                 recent 02/02/2023.  Sonographer:    Heather Hawks RDCS Referring Phys: [facility] DELENA LEAVENS  Sonographer Comments: * IMPRESSIONS  1. The findings of the study demonstrate that no stress-induced outflow tract obstruction is present. Conclusion(s)/Recommendation(s): No significant increase in LVOTO gradient with squat or treadmill.  FINDINGS Exam Protocol: The patient exercised on a treadmill according to a Modified Bruce protocol.  Patient Performance: The patient exercised for 3 minutes. The maximum stage achieved was I of the Modified Bruce protocol. The baseline heart rate was 77 bpm. The heart rate at peak stress was 98 bpm. The target heart rate was calculated to be 122 bpm. The percentage of maximum predicted heart rate achieved was 68.4 %. The baseline blood pressure was 140/80 mmHg. The blood pressure at peak stress was 155/77 mmHg. The patient developed shortness of breath, fatigue, leg fatigue and dizziness during the stress exam. The symptoms resolved with rest.  EKG: Resting EKG showed normal sinus rhythm with left bundle branch block. The patient developed no abnormal EKG findings during exercise.  2D Echo Findings: The baseline ejection fraction was 75%. The peak ejection fraction at stress was 80%. Baseline regional wall motion abnormalities were not present.  Stress Doppler:  LVOT: The baseline LVOT gradient was 15 mmHG. The findings of the study demonstrate that no stress-induced outflow tract obstruction is present.  Mitral Regurgitation: At baseline, mild mitral regurgitation was present.  Morene Brownie Electronically signed on 12/30/2023 at 4:47:47 PM    Final     Administration History     None          Latest Ref Rng & Units 02/09/2023    2:00 PM  PFT Results   FVC-Pre L 1.80   FVC-Predicted Pre % 69   FVC-Post L 1.83   FVC-Predicted Post % 70   Pre FEV1/FVC % % 70   Post FEV1/FCV % % 72   FEV1-Pre L 1.26   FEV1-Predicted Pre % 65   FEV1-Post L 1.32   DLCO uncorrected ml/min/mmHg 13.20   DLCO UNC% % 73   DLVA Predicted % 88   TLC L 4.82   TLC % Predicted % 101   RV % Predicted % 123     Lab Results  Component Value Date   NITRICOXIDE 17 01/11/2024        Assessment & Plan:   DOE (dyspnea on exertion) Worsening DOE over the last year. Recent stress test with normal EF. However, had a cardiac monitor that showed sustained runs of VT and episodes of complete heart block. Suspect underlying cardiac arrhythmia is primary cause of her worsening DOE. ED precautions reviewed. Encouraged to keep appt with EP physician 8/14. Hold on repeat PFT until after this visit. No hypoxia on recent exercise stress test.  Patient Instructions  Stop Incruse. Start Breztri  2 puffs Twice daily. Brush tongue and rinse mouth afterwards. Brush tongue and rinse afterwards Continue Albuterol  inhaler 2 puffs every 6 hours as needed for shortness of breath or wheezing. Notify if symptoms persist despite rescue inhaler/neb use.  Continue claritin  daily Continue saline rinses 1-2 times a day, use bottled distilled water  Use flonase 2 sprays each nostril daily; wait 30 minutes after the rinses to do the flonase  Omeprazole  40 mg daily - take in AM, 30 minutes prior to breakfast for the next 6 weeks. If cough improves, we will continue this for reflux management  Follow up with the heart doctor as scheduled this Thursday If you develop worsening lightheadedness, dizziness, low blood pressure, chest discomfort, worsening shortness of breath, go to the hospital  Labs and x ray today   Follow up in 6 weeks after PFT with Dr. Malka or Izetta Malachy PIETY. If symptoms do not improve or worsen, please contact office for sooner follow up or seek emergency care.     COPD, moderate (HCC) Moderate COPD with FEV1 68% a year ago. Progressive DOE. See above. Exhaled nitric oxide  testing today was normal without evidence of type II inflammation. She does her mild peripheral eosinophilia so may benefit from addition of ICS. Will plan to repeat PFT at follow up, pending cardiac evaluation/treatment. Trial step up to Breztri  and d/c Incruse. Side effect profile reviewed and teachback performed. Does not appear to be in acute exacerbation. Action plan in place. Recheck CXR today. Prior CXR from 06/2023 with ground glass bibasilar opacities.   GERD (gastroesophageal reflux disease) Symptoms of GERD, possibly contributing to cough/poor COPD/asthma management. Start PPI with omeprazole . Reviewed side effect profile. Reassess after 6 weeks for improvement. GERD precautions reviewed.   Cardiac arrhythmia See above.   Rhinitis, allergic Discussed postnasal drainage as contributing factor to cough. Encouraged to continue nasal rinses and use flonase daily. Check allergen panel today - not drawn at last OV.   A-fib (HCC) RRR today. Compliant with AC. Follow up with cardiology as scheduled   Advised if symptoms do not improve or worsen, to please contact office for sooner follow up or seek emergency care.   I spent 45 minutes of dedicated to the care of this patient on the date of this encounter to include pre-visit review of records, face-to-face time with the patient discussing conditions above, post visit ordering of testing, clinical documentation with the electronic health record, making appropriate referrals as documented, and communicating necessary findings to members of the patients care team.  Comer LULLA Malachy, NP 01/11/2024  Pt aware and understands NP's role.

## 2024-01-11 NOTE — Assessment & Plan Note (Signed)
 Worsening DOE over the last year. Recent stress test with normal EF. However, had a cardiac monitor that showed sustained runs of VT and episodes of complete heart block. Suspect underlying cardiac arrhythmia is primary cause of her worsening DOE. ED precautions reviewed. Encouraged to keep appt with EP physician 8/14. Hold on repeat PFT until after this visit. No hypoxia on recent exercise stress test.   Patient Instructions  Stop Incruse. Start Breztri  2 puffs Twice daily. Brush tongue and rinse mouth afterwards. Brush tongue and rinse afterwards Continue Albuterol  inhaler 2 puffs every 6 hours as needed for shortness of breath or wheezing. Notify if symptoms persist despite rescue inhaler/neb use.  Continue claritin  daily Continue saline rinses 1-2 times a day, use bottled distilled water  Use flonase 2 sprays each nostril daily; wait 30 minutes after the rinses to do the flonase  Omeprazole  40 mg daily - take in AM, 30 minutes prior to breakfast for the next 6 weeks. If cough improves, we will continue this for reflux management  Follow up with the heart doctor as scheduled this Thursday If you develop worsening lightheadedness, dizziness, low blood pressure, chest discomfort, worsening shortness of breath, go to the hospital  Labs and x ray today   Follow up in 6 weeks after PFT with Dr. Malka or Deanna [name]. If symptoms do not improve or worsen, please contact office for sooner follow up or seek emergency care.

## 2024-01-11 NOTE — Assessment & Plan Note (Signed)
 See above.

## 2024-01-11 NOTE — Telephone Encounter (Signed)
 I spoke with his nurse. She just had a PFT 01/2023. She told his nurse symptoms were unchanged from this time. If her shortness of breath is worse, then I would recommend repeating before but if not and there has been no change, then I would hold on repeat. Thanks.

## 2024-01-11 NOTE — Assessment & Plan Note (Signed)
 Discussed postnasal drainage as contributing factor to cough. Encouraged to continue nasal rinses and use flonase daily. Check allergen panel today - not drawn at last OV.

## 2024-01-11 NOTE — Progress Notes (Signed)
Chest x-ray was clear.

## 2024-01-11 NOTE — Assessment & Plan Note (Addendum)
 Moderate COPD with FEV1 68% a year ago. Progressive DOE. See above. Exhaled nitric oxide  testing today was normal without evidence of type II inflammation. She does her mild peripheral eosinophilia so may benefit from addition of ICS. Will plan to repeat PFT at follow up, pending cardiac evaluation/treatment. Trial step up to Breztri  and d/c Incruse. Side effect profile reviewed and teachback performed. Does not appear to be in acute exacerbation. Action plan in place. Recheck CXR today. Prior CXR from 06/2023 with ground glass bibasilar opacities.

## 2024-01-11 NOTE — Assessment & Plan Note (Signed)
 RRR today. Compliant with AC. Follow up with cardiology as scheduled

## 2024-01-11 NOTE — Patient Instructions (Addendum)
 Stop Incruse. Start Breztri  2 puffs Twice daily. Brush tongue and rinse mouth afterwards. Brush tongue and rinse afterwards Continue Albuterol  inhaler 2 puffs every 6 hours as needed for shortness of breath or wheezing. Notify if symptoms persist despite rescue inhaler/neb use.  Continue claritin  daily Continue saline rinses 1-2 times a day, use bottled distilled water  Use flonase 2 sprays each nostril daily; wait 30 minutes after the rinses to do the flonase  Omeprazole  40 mg daily - take in AM, 30 minutes prior to breakfast for the next 6 weeks. If cough improves, we will continue this for reflux management  Follow up with the heart doctor as scheduled this Thursday If you develop worsening lightheadedness, dizziness, low blood pressure, chest discomfort, worsening shortness of breath, go to the hospital  Labs and x ray today   Follow up in 6 weeks after PFT with Dr. Malka or Izetta Malachy PIETY. If symptoms do not improve or worsen, please contact office for sooner follow up or seek emergency care.

## 2024-01-11 NOTE — Telephone Encounter (Signed)
 Copied from CRM #[id number]. Topic: General - Other >> Jan 11, 2024  2:16 PM Corean SAUNDERS wrote: Reason for CRM: Patient is requesting a letter of reasonable accomodation for her to be able to climb the stairs in her 2nd floor apartment from Dr. Malka or NP Comer Rouleau. Patient is requesting this letter to be mailed to her and submitted through MyChart.

## 2024-01-11 NOTE — Telephone Encounter (Signed)
 Copied from CRM [id number]. Topic: General - Other >> Jan 10, 2024 11:05 AM Shona RAMAN wrote: Reason for CRM: patient would like to know if we spoke with dr. Santo and if she is able to get a pft done tomorrow for her appointment.   Routing to [place] to advise.

## 2024-01-12 NOTE — Telephone Encounter (Signed)
 Copied from CRM [id number]. Topic: General - Call Back - No Documentation >> Jan 12, 2024 10:27 AM Joesph PARAS wrote: Reason for CRM: Patient is returning call to [facility], no documentation and no voicemail. Please return call to patient.  Ashlyn tried contacting the patient in regards to PFT.  Pt has ov & PFT scheduled in [place].  Pt had ov 8/12 and PFT orders were placed by Katie Cobb then.  Called and verified appt info with the pt and she is aware. Nfn

## 2024-01-12 NOTE — Telephone Encounter (Signed)
 Atc x1 lmtcb

## 2024-01-13 ENCOUNTER — Ambulatory Visit: Payer: MEDICARE | Attending: Internal Medicine | Admitting: Internal Medicine

## 2024-01-13 ENCOUNTER — Encounter: Payer: Self-pay | Admitting: Internal Medicine

## 2024-01-13 VITALS — BP 149/75 | HR 78 | Ht 62.0 in | Wt 166.0 lb

## 2024-01-13 DIAGNOSIS — I442 Atrioventricular block, complete: Secondary | ICD-10-CM | POA: Diagnosis present

## 2024-01-13 NOTE — Progress Notes (Signed)
 HPI Deanna [name] is referred by Dr. REY for evaluation of symptomatic bradycardia and VT in the setting of HCM. She isa pleasant 78 yo woman with HTN who was found to have an abnormal cMRI. She wore a cardiac monitor which demonstrated 30 seconds of sustained VT at 150/min as well as periods of CHB, both during the night time and daytime. She was exercising in rehab when she felt like she was going to pass out. She has been found to have 9 second nocturnal pauses but 6 second daytime pauses in the setting of beta blocker therapy.  Allergies  Allergen Reactions   Codeine Nausea And Vomiting and Palpitations     Nausea and/or Vomiting, chest pain and acid reflux Feels like she is having a heart attack.     Gabapentin  Other (See Comments)    Eyes went numb and blurry  Blurred Vision as well   Gluten Meal Diarrhea and Other (See Comments)    Severe diarrhea per member    Dust Mite Extract     Upper Respiratory issues    Sulfa Antibiotics Other (See Comments)   Terbinafine Hcl Rash     Current Outpatient Medications  Medication Sig Dispense Refill   albuterol  (VENTOLIN  HFA) 108 (90 Base) MCG/ACT inhaler Inhale 2 puffs into the lungs every 6 (six) hours as needed for wheezing or shortness of breath. 8 g 2   apixaban  (ELIQUIS ) 5 MG TABS tablet Take 1 tablet (5 mg total) by mouth 2 (two) times daily. Hold medication from 9/15 to 9/21 for right hip surgery 180 tablet 1   Ascorbic Acid  (VITAMIN C ) 1000 MG tablet Take 4,000 mg by mouth daily.     budesonide -glycopyrrolate -formoterol  (BREZTRI  AEROSPHERE) 160-9-4.8 MCG/ACT AERO inhaler Inhale 2 puffs into the lungs in the morning and at bedtime. 10.7 g 11   budesonide -glycopyrrolate -formoterol  (BREZTRI  AEROSPHERE) 160-9-4.8 MCG/ACT AERO inhaler Inhale 2 puffs into the lungs in the morning and at bedtime. 11.8 g 0   Calcium -Magnesium  (CAL-MAG PO) Take 2,000 mg by mouth at bedtime. 1000 mg     Carboxymethylcellul-Glycerin  (CLEAR EYES FOR DRY  EYES OP) Place 1 drop into both eyes daily.     fluorouracil (EFUDEX) 5 % cream Apply 1 Application topically 2 (two) times daily.     hydrochlorothiazide  (HYDRODIURIL ) 25 MG tablet Take 1 tablet (25 mg total) by mouth 2 (two) times daily. 180 tablet 3   hydrocortisone  2.5 % cream Apply topically as needed (hemmeroids).     imiquimod (ALDARA) 5 % cream as needed (rash).     loratadine  (CLARITIN ) 10 MG tablet Take 10 mg by mouth daily as needed for allergies.     losartan  (COZAAR ) 50 MG tablet TAKE 1 TABLET BY MOUTH EVERY DAY 90 tablet 1   metoprolol  succinate (TOPROL -XL) 100 MG 24 hr tablet Take 1 tablet (100 mg total) by mouth daily. Take with or immediately following a meal. 90 tablet 3   milk thistle 175 MG tablet Take 175 mg by mouth daily.     nitroGLYCERIN  (NITROSTAT ) 0.4 MG SL tablet Place 1 tablet (0.4 mg total) under the tongue every 5 (five) minutes x 3 doses as needed for chest pain. 25 tablet 1   omeprazole  (PRILOSEC) 40 MG capsule Take 1 capsule (40 mg total) by mouth daily. 30 capsule 1   Probiotic Product (PROBIOTIC DAILY PO) Take 1 tablet by mouth daily.     rosuvastatin  (CRESTOR ) 20 MG tablet Take 1 tablet (20 mg total) by  mouth daily. 30 tablet 6   triamcinolone cream (KENALOG) 0.1 % Apply topically as needed (rash).     Vitamin A  2400 MCG (8000 UT) CAPS Take 16,000 Units by mouth daily. 8000 units     Vitamin D, Ergocalciferol, (DRISDOL) 1.25 MG (50000 UNIT) CAPS capsule Take 50,000 Units by mouth once a week.     zolpidem (AMBIEN) 5 MG tablet Take 5 mg by mouth at bedtime.     No current facility-administered medications for this visit.     Past Medical History:  Diagnosis Date   A-fib (HCC)    Abnormal glucose 03/06/2020   Acute coronary syndrome (HCC) 02/09/2023   Age-related osteoporosis without current pathological fracture 11/22/2015   Overview:  Femoral neck  Formatting of this note might be different from the original. Femoral neck   Allergic rhinitis due to  pollen 08/19/2015   Allergy    Aortic atherosclerosis (HCC) 04/04/2021   Arthritis    Carpal tunnel syndrome, bilateral 02/28/2002   Overview:  H/O CTS   Celiac disease    Cervical disc disorder 05/10/2009   Chronic cough 02/15/2006   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 11/26/2015   Formatting of this note might be different from the original. Added automatically from request for surgery [id number]   Chronic pain syndrome 04/01/2015   Colon polyps    Dyspnea    Essential hypertension 07/20/2000   Overview:  HBP   Family history of adverse reaction to anesthesia    mother delerium   Female cystocele 02/27/2009   GAD (generalized anxiety disorder) 08/19/2015   GERD (gastroesophageal reflux disease)    Heart murmur    History of Bell's palsy 03/21/2015   Hx of adenomatous colonic polyps 12/20/2008   Overview:  Colonoscopy 07/2003 - LMD - polyps removed - recommended repeat in 3 years Colonoscopy 09/2011 - Dr. Brenita - hyperplastic polyp(s) were removed - repeat in 5 years   Hypertension    Incomplete uterovaginal prolapse 03/04/2004   Insomnia    Left bundle-branch block 05/20/2006   Lumbar radiculopathy 01/09/2016   Formatting of this note might be different from the original. 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 Femoral neck   Other intervertebral disc degeneration, lumbar region 01/29/2015   Pre-operative cardiovascular examination 09/03/2020   Primary osteoarthritis, right hand 02/28/2002   Overview:  OA HANDS, ESP THUMBS   Radial tunnel syndrome, right 11/04/2016   Radiculopathy, lumbar region 11/22/2014    Overview:  Added automatically from request for surgery [id number]   Rhinitis, allergic 02/24/2002   Overview:  ALLERGY SYMPTOMS - states was seen by allergist in past and informed allergic to dust   S/P left total hip arthroplasty 09/17/2020   S/P total right hip arthroplasty 02/18/2023   Sacroiliitis (HCC) 08/02/2019   Screening for colon cancer 04/17/2005   Formatting of this note might be different from the original. Colonoscopy 2005   Sensorineural hearing loss (SNHL) of both ears 07/05/2019   pt can hear normally   Short sleeper 04/29/2020   Snoring 04/29/2020   Spinal stenosis    Spinal stenosis, lumbar region without neurogenic claudication 11/22/2014   Tinnitus aurium, bilateral 07/05/2019  Trigger thumb, unspecified thumb 02/28/2002   Formatting of this note might be different from the original. TRIGGER THUMBS - started spring 2003. Injected 02/28/02   Unspecified asthma, uncomplicated 12/25/2004    ROS:   All systems reviewed and negative except as noted in the HPI.   Past Surgical History:  Procedure Laterality Date   ABDOMINAL HYSTERECTOMY  2010   APPENDECTOMY  1969   CHOLECYSTECTOMY  1969   SPINAL FUSION  01/2016   TONSILLECTOMY AND ADENOIDECTOMY  1956   TOTAL HIP ARTHROPLASTY Left 09/17/2020   Procedure: TOTAL HIP ARTHROPLASTY ANTERIOR APPROACH;  Surgeon: Ernie Cough, MD;  Location: [facility];  Service: Orthopedics;  Laterality: Left;  70 mins   TOTAL HIP ARTHROPLASTY Right 02/18/2023   Procedure: TOTAL HIP ARTHROPLASTY ANTERIOR APPROACH;  Surgeon: Ernie Cough, MD;  Location: [facility];  Service: Orthopedics;  Laterality: Right;     Family History  Problem Relation Age of Onset   Arthritis Mother    Diabetes Mother    Pancreatic cancer Mother 81   Early death Father    Leukemia Father    Leukemia Sister    Early death Sister    Diabetes Sister      Social History   Socioeconomic History   Marital status: Divorced    Spouse name: Not on file   Number of  children: 2   Years of education: 2 years college   Highest education level: Not on file  Occupational History   Occupation: Retired  Tobacco Use   Smoking status: Former    Current packs/day: 0.00    Average packs/day: 0.5 packs/day for 30.0 years (15.0 ttl pk-yrs)    Types: Cigarettes    Start date: 01/01/1968    Quit date: 12/31/1997    Years since quitting: 26.0   Smokeless tobacco: Never   Tobacco comments:    Quit in 1999  Vaping Use   Vaping status: Never Used  Substance and Sexual Activity   Alcohol  use: Not Currently   Drug use: No   Sexual activity: Not Currently    Birth control/protection: Post-menopausal  Other Topics Concern   Not on file  Social History Narrative   Lives alone.   Right-handed.   One cup caffeine per day.   Social Drivers of Health   Financial Resource Strain: High Risk (01/14/2022)   Overall Financial Resource Strain (CARDIA)    Difficulty of Paying Living Expenses: Hard  Food Insecurity: Low Risk  (08/25/2023)   Received from [facility]   Hunger Vital Sign    Within the past 12 months, you worried that your food would run out before you got money to buy more: Never true    Within the past 12 months, the food you bought just didn't last and you didn't have money to get more. : Never true  Transportation Needs: No Transportation Needs (08/25/2023)   Received from [facility]    In the past 12 months, has lack of reliable transportation kept you from medical appointments, meetings, work or from getting things needed for daily living? : No  Physical Activity: Inactive (01/14/2022)   Exercise Vital Sign    Days of Exercise per Week: 0 days    Minutes of Exercise per Session: 0 min  Stress: Stress Concern Present (01/14/2022)   [employer] of Occupational Health - Occupational Stress Questionnaire    Feeling of Stress : To some extent  Social Connections: Unknown (10/13/2021)   Received from [facility]  Social  Network    Social Network: Not on file  Intimate Partner Violence: Not At Risk (02/18/2023)   Humiliation, Afraid, Rape, and Kick questionnaire    Fear of Current or Ex-Partner: No    Emotionally Abused: No    Physically Abused: No    Sexually Abused: No     BP (!) 149/75 (BP Location: Right Arm)   Pulse 78   Ht 5' 2 (1.575 m)   Wt 166 lb (75.3 kg)   SpO2 93%   BMI 30.36 kg/m   Physical Exam:  Well appearing 78 yo woman, NAD HEENT: Unremarkable Neck:  No JVD, no thyromegally Lymphatics:  No adenopathy Back:  No CVA tenderness Lungs:  Clear with no wheezes HEART:  Regular rate rhythm, no murmurs, no rubs, no clicks Abd:  soft, positive bowel sounds, no organomegally, no rebound, no guarding Ext:  2 plus pulses, no edema, no cyanosis, no clubbing Skin:  No rashes no nodules Neuro:  CN II through XII intact, motor grossly intact   Assess/Plan: 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 I have recommended pacing. VT - she had 30 seconds of VT, over 40 beats. She is not a great candidate for ICD mostly due to advanced age but is probably at increased risk for sudden death with gadolinium uptake on her MRI. I'll discuss ICD insertion vs ppm and uptitration further of her medical therapy with Dr. REY.  Mild LV dysfunction - she has an EF of 45-50%. This would be considered an additional risk factor for SCD.   Deanna Cung Masterson,MD

## 2024-01-13 NOTE — Patient Instructions (Addendum)
 Medication Instructions:  Your physician recommends that you continue on your current medications as directed. Please refer to the Current Medication list given to you today.  *If you need a refill on your cardiac medications before your next appointment, please call your pharmacy*  Lab Work: Labs within 30 days of procedure  You may go to any [facility] Location for your lab work:  [facility] - [address] ([facility]) - [address] [phone] [address]  [place] - [address]   [place]  - [address]   [place] - [address] - [address] [employer]  - [address] [place] - [address] ([facility]   If you have labs (blood work) drawn today and your tests are completely normal, you will receive your results only by: [employer] (if you have MyChart)  If you have any lab test that is abnormal or we need to change your treatment, we will call you or send a MyChart message to review the results.  Testing/Procedures: Dates for procedure: Sept - 5 Oct - 1, 3, 7, 15, 17, 23, 31 Nov - 5, 7, 11  Follow-Up: At [facility], you and your health needs are our priority.  As part of our continuing mission to provide you with exceptional heart care, we have created designated Provider Care Teams.  These Care Teams include your primary Cardiologist (physician) and Advanced Practice Providers (APPs -  Physician Assistants and Nurse Practitioners) who all work together to provide you with the care you need, when you need it.   Your next appointment:   To be scheduled

## 2024-01-14 ENCOUNTER — Telehealth: Payer: Self-pay | Admitting: Internal Medicine

## 2024-01-14 NOTE — Telephone Encounter (Signed)
 I called and spoke to pt. I explained to her that I couldn't schedule her procedure until Dr. Waddell discussed this with Dr. Arnetha and let me know what device to schedule her for.  She feels like she has waited long enough and this should be expedited.   She would like to get this done ASAP.

## 2024-01-14 NOTE — Telephone Encounter (Signed)
 Patient is requesting to speak with a a device nurse to discuss scheduling PPM implant. She would also like to set up procedure as soon as possible, and open to seeing another EP MD who may have sooner availability. Please advise.

## 2024-01-16 DIAGNOSIS — I422 Other hypertrophic cardiomyopathy: Secondary | ICD-10-CM

## 2024-01-16 DIAGNOSIS — I480 Paroxysmal atrial fibrillation: Secondary | ICD-10-CM

## 2024-01-17 ENCOUNTER — Encounter: Payer: Self-pay | Admitting: Internal Medicine

## 2024-01-17 ENCOUNTER — Telehealth (HOSPITAL_COMMUNITY): Payer: Self-pay | Admitting: *Deleted

## 2024-01-17 ENCOUNTER — Other Ambulatory Visit: Payer: Self-pay | Admitting: Internal Medicine

## 2024-01-17 DIAGNOSIS — I517 Cardiomegaly: Secondary | ICD-10-CM

## 2024-01-17 NOTE — Telephone Encounter (Signed)
 Patient called to check on status of her call on Friday.  She would like to be scheduled for her procedure already, she feels she has waited long enough.

## 2024-01-17 NOTE — Telephone Encounter (Signed)
 error

## 2024-01-17 NOTE — Telephone Encounter (Signed)
 Patient reminded of upcoming Amyloid Scan.  [name]

## 2024-01-17 NOTE — Telephone Encounter (Signed)
 Patient is calling to check on status of letter she requested.  She would like a call back.

## 2024-01-18 ENCOUNTER — Telehealth: Payer: Self-pay

## 2024-01-18 ENCOUNTER — Telehealth: Payer: Self-pay | Admitting: Internal Medicine

## 2024-01-18 DIAGNOSIS — B370 Candidal stomatitis: Secondary | ICD-10-CM

## 2024-01-18 NOTE — Telephone Encounter (Signed)
 Copied from CRM #[id number]. Topic: Clinical - Medication Question >> Jan 18, 2024  2:25 PM Rozanna G wrote: Reason for CRM: PT STATED SHE CAN NOT TAKE THE budesonide -glycopyrrolate -formoterol  (BREZTRI  AEROSPHERE) 160-9-4.8 MCG/ACT AERO inhaler AND WOULD LIKE TO BACK TO budesonide -formoterol  (SYMBICORT ) 160-4.5 MCG/ACT inhaler.  PT WANTS TO KNOW IF SHE STILL NEEDS TO TAKE THE budesonide -formoterol  (SYMBICORT ) 160-4.5 MCG/ACT inhaler

## 2024-01-18 NOTE — Telephone Encounter (Signed)
 Patient called to follow-up on her MyChart message.  Patient stated she will need her letter revised to only say her medical condition that prohibits her from climbing stairs.

## 2024-01-18 NOTE — Telephone Encounter (Signed)
 Called patient back to let her know we got her mychart message and letter has been updated.

## 2024-01-18 NOTE — Telephone Encounter (Signed)
 Patient is calling back for update and would like to be able to pick up the later on tomorrow. Please advise

## 2024-01-18 NOTE — Telephone Encounter (Signed)
 Patient is calling back for update, she states that she is very frustrated feeling like we are dragging our feet with a decision. Please advise

## 2024-01-18 NOTE — Telephone Encounter (Signed)
 Wrote letter for patient per Dr. Madireddy's note below. Will send through Mychart. Called patient to let her know. Patient thanked us  for the call and the letter.   Madireddy, Deanna SAUNDERS, MD to Deanna Olam BIRCH, RN    01/14/24 12:10 AM Patient with high suspicion for cardiac amyloidosis. Please place a referral to heart failure clinic for further evaluation of this. Previously referred to hypertrophic cardiomyopathy clinic, but this referral can be canceled.   He is requesting a letter to allow accommodations so she can be assigned lower floor apartment given her multiple comorbidities.   Please create a letter stating from cardiac standpoint given heart failure and possible cardiac amyloidosis that can limit physical abilities, please do consider per request to be assigned an apartment on the lower floor.  Thank you

## 2024-01-19 ENCOUNTER — Encounter: Payer: Self-pay | Admitting: Internal Medicine

## 2024-01-19 ENCOUNTER — Ambulatory Visit (HOSPITAL_COMMUNITY)
Admission: RE | Admit: 2024-01-19 | Discharge: 2024-01-19 | Disposition: A | Discharge status: Home / Self Care | Payer: MEDICARE | Source: Ambulatory Visit | Attending: Cardiovascular Disease | Admitting: Cardiovascular Disease

## 2024-01-19 DIAGNOSIS — I517 Cardiomegaly: Secondary | ICD-10-CM | POA: Diagnosis not present

## 2024-01-19 MED ORDER — TECHNETIUM TC 99M PYROPHOSPHATE
21.9000 | Freq: Once | INTRAVENOUS | Status: AC
  Administered 2024-01-19: 21.9 via INTRAVENOUS

## 2024-01-20 ENCOUNTER — Encounter: Payer: Self-pay | Admitting: Internal Medicine

## 2024-01-20 ENCOUNTER — Telehealth: Payer: Self-pay | Admitting: Internal Medicine

## 2024-01-20 LAB — MYOCARDIAL AMYLOID PLANAR & SPECT: H/CL Ratio: 1.51

## 2024-01-20 MED ORDER — NYSTATIN 100000 UNIT/ML MT SUSP: 5.0000 mL | Freq: Four times a day (QID) | OROMUCOSAL | 0 refills | Status: DC

## 2024-01-20 NOTE — Telephone Encounter (Signed)
 Is she using the Breztri ? If so, sounds like she has thrush. I sent in Nystatin  rinses 4 times daily for 10 days. Swish and swallow. Make sure to clean mouth really well after Breztri  use and gargle/spit. Can also add a spacer on  Yes, can use the rescue as needed for SOB/wheezing. Thanks.

## 2024-01-20 NOTE — Telephone Encounter (Signed)
 [name] is a 78 year old with atrial fibrillation and COPD has LVH and has received additional testing:   She has a history of atrial fibrillation and experiences shortness of breath, classified as NYHA class III. Her previous cardiac testing was negative for an inducible gradient, and her ejection fraction was reported to be around 47%. A heart monitor evaluation in her history showed long pauses, high-grade AV block, and a 12-second run of slow ventricular rhythm (< 200 bpm)  Her respiratory symptoms are complicated by a history of COPD and asthma.   She experiences tingling in her arms and legs and has a history of carpal tunnel syndrome. She has a history of chronic constipation.  These symptoms can be seen in ATTR-AC.  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 Her PYP scan should have been ordered through the cardiac amyloidosis screening order set but I do not yet see the blood and urine testing.  She had Grade III uptake on her nuclear scintigraphy.  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  Despite age, I think discussion of ICD is reasonable; Will discuss with Dr. Waddell to get his thoughts so we can make timely intervention plan - if the SPEP (with IFE) and UPEP were not ordered, please order them through the cardiac amyloidosis screening order set -we would re-offer her genetic testing for hATTR testing (declined at our index visit) - if other testing is negative, starting attruby would be reasonable vs tafamadis and vutrisiran  Left her a voicemail noting that I wanted to review her testing both from 8/20 and the testing she had [place] was [place].  Stanly Leavens, MD FASE [facility]  [address] [place], [place] [address] [phone]  4:10 PM

## 2024-01-21 NOTE — Telephone Encounter (Signed)
 See message from Dr. Santo.[email or web address]

## 2024-01-21 NOTE — Telephone Encounter (Signed)
 [name] is a 78 year old with atrial fibrillation and COPD who presents with shortness of breath and cardiac symptoms. She was referred by Dr. Waddell for evaluation of cardiac amyloidosis and device consideration.  She experiences ongoing shortness of breath and episodes of feeling 'off'. She has numbness and tingling in her arms and legs, with a history of carpal tunnel syndrome. Despite a recent resolution of constipation after a cleanse, she feels her quality of life is limited.  She has a history of atrial fibrillation and has experienced episodes of high-grade heart block and sustained slow ventricular tachycardia. During one episode, her heart stopped while she was in physical therapy, causing alarm. She has had two such episodes, one of which was captured on a heart monitor.  Her cardiac workup includes a cardiac MRI showing an ejection fraction of 47% and grade 3 uptake on nuclear scintigraphy. She is awaiting blood and urine tests to confirm the diagnosis. She has been experiencing numbness and tingling.  Her family history includes a sister who died of leukemia at age 59 and a father who also had leukemia and possibly other related illnesses. There is no known family history of plasma cell dyscrasias such as multiple myeloma or Waldenstrom's macroglobulinemia.  Current medications include metoprolol  and hydrochlorothiazide .  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 Symptoms include shortness of breath, numbness, and tingling, consistent with ATTR. Blood and urine tests (SPEP and UPEP) are pending to rule out AL cardiac amyloidosis. - Order SPEP and UPEP tests to rule out AL cardiac amyloidosis (ORDER THROUGH HE CARDIAC AMYLOIDOSIS SCREENING  ORDER SET AS THEY MUST HAVE THE IFE) - Initiate Acaramidis (Attruby) pending insurance approval and test results; will need  Pharm D support - Monitor for potential side effects such as abdominal discomfort or diarrhea.  Atrial fibrillation Atrial fibrillation is present but not the primary cause of recent symptoms. Symptoms include feeling off and shortness of breath.   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 Symptoms include episodes of feeling faint during physical activity. A pacemaker is planned to address the heart block, with consideration of a defibrillator due to scar tissue and ventricular tachycardia. The decision between pacemaker and defibrillator will be made by the electrophysiologist, Dr. Waddell, who will discuss risks and benefits with her. A pacemaker is likely preferred due to her age and the nature of her arrhythmias, but the final decision will be made by Dr. Waddell (she would not have been showed out of her slow VT with a rate of 156 bpm and her risk of AF firing is present) - Discuss risks and benefits of pacemaker versus defibrillator with Dr. Waddell.  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 Symptoms include shortness of breath and limited quality of life. Fluid retention is suspected to contribute to symptoms. Treatment aims to stabilize cardiac amyloidosis and improve quality of life. - Monitor fluid status and adjust diuretics as needed (hydrochlorothiazide  to lasix ) - Continue current heart failure management regimen as above   Time Spent Directly with Patient:   I have spent a total of 52 minutes with the patient reviewing her care and answering her questions. Discussed disease state education and potential timeline and  delays that could surrounding her care.

## 2024-01-24 ENCOUNTER — Telehealth: Payer: Self-pay

## 2024-01-24 ENCOUNTER — Other Ambulatory Visit (HOSPITAL_COMMUNITY): Payer: Self-pay

## 2024-01-24 DIAGNOSIS — E854 Organ-limited amyloidosis: Secondary | ICD-10-CM

## 2024-01-24 NOTE — Telephone Encounter (Signed)
 Referral to heart failure clinic per Dr. Liborio

## 2024-01-24 NOTE — Telephone Encounter (Signed)
 Reviewed Dr. Madireddy's note. Letter created as per dr. Madireddy's note and faxed to [facility] [phone]

## 2024-01-24 NOTE — Addendum Note (Signed)
 Addended by: ARLOA PLANAS D on: 01/24/2024 01:00 PM   Modules accepted: Orders

## 2024-01-25 ENCOUNTER — Encounter: Payer: Self-pay | Admitting: Internal Medicine

## 2024-01-26 ENCOUNTER — Telehealth: Payer: Self-pay

## 2024-01-26 DIAGNOSIS — I422 Other hypertrophic cardiomyopathy: Secondary | ICD-10-CM

## 2024-01-26 DIAGNOSIS — Z01812 Encounter for preprocedural laboratory examination: Secondary | ICD-10-CM

## 2024-01-26 NOTE — Telephone Encounter (Signed)
Called pt, see phone note

## 2024-01-26 NOTE — Telephone Encounter (Signed)
 Spoke with pt about PPM-I. Verified pt still wanted 9/5 date being held for her. Requested MyChart letter with instructions. Labs ordered and released. Pt will call or message back with any questions.

## 2024-01-27 NOTE — Telephone Encounter (Signed)
 Addressed in another phone note

## 2024-01-28 ENCOUNTER — Telehealth (HOSPITAL_COMMUNITY): Payer: Self-pay

## 2024-01-28 DIAGNOSIS — I422 Other hypertrophic cardiomyopathy: Secondary | ICD-10-CM

## 2024-01-28 DIAGNOSIS — Z01812 Encounter for preprocedural laboratory examination: Secondary | ICD-10-CM

## 2024-01-28 NOTE — Telephone Encounter (Signed)
 Spoke with patient to discuss upcoming procedure.   Confirmed patient is scheduled for a Permanent transvenous pacemaker (PPM) on Friday, September 5 with Dr. Danelle Birmingham. Instructed patient to arrive at the Main Entrance A at [facility]: [address] [place], [place] [address] and check in at Admitting at 9:00 AM.   Labs by 02/01/24.   Any recent signs of acute illness or been started on antibiotics?  No Any new medications started? No Any medications to hold?  Yes; Hold your Eliquis  (Apixaban ) for 2 day(s) prior to your procedure. Your last dose will be Tuesday, September 2, PM dose. Hold hydrochlorothiazide  the morning of your procedure. Medication instructions:  On the morning of your procedure take all other medications not discussed with a sip of water .  No eating or drinking after midnight prior to procedure.   The night before your procedure and the morning of your procedure, wash thoroughly with the CHG surgical soap from the neck down, paying special attention to the area where your procedure will be performed.  Advised of plan to go home the same day and will only stay overnight if medically necessary. You MUST have a responsible adult to drive you home and MUST be with you the first 24 hours after you arrive home.  Informed patient a nurse will call a day before the procedure to confirm arrival time and ensure instructions are followed.  Patient verbalized understanding to all instructions provided and agreed to proceed with procedure.

## 2024-01-29 ENCOUNTER — Emergency Department (HOSPITAL_COMMUNITY): Payer: MEDICARE

## 2024-01-29 ENCOUNTER — Inpatient Hospital Stay (HOSPITAL_COMMUNITY)
Admission: EM | Admit: 2024-01-29 | Discharge: 2024-02-03 | DRG: 243 | Disposition: A | Discharge status: Home / Self Care | Payer: MEDICARE | Attending: Internal Medicine | Admitting: Internal Medicine

## 2024-01-29 ENCOUNTER — Encounter (HOSPITAL_COMMUNITY): Payer: Self-pay

## 2024-01-29 ENCOUNTER — Other Ambulatory Visit: Payer: Self-pay

## 2024-01-29 DIAGNOSIS — I495 Sick sinus syndrome: Secondary | ICD-10-CM | POA: Diagnosis not present

## 2024-01-29 DIAGNOSIS — K219 Gastro-esophageal reflux disease without esophagitis: Secondary | ICD-10-CM | POA: Diagnosis present

## 2024-01-29 DIAGNOSIS — I700 Atherosclerosis of aorta: Secondary | ICD-10-CM | POA: Diagnosis present

## 2024-01-29 DIAGNOSIS — Z882 Allergy status to sulfonamides status: Secondary | ICD-10-CM

## 2024-01-29 DIAGNOSIS — I447 Left bundle-branch block, unspecified: Secondary | ICD-10-CM | POA: Diagnosis present

## 2024-01-29 DIAGNOSIS — I10 Essential (primary) hypertension: Secondary | ICD-10-CM | POA: Diagnosis present

## 2024-01-29 DIAGNOSIS — Z91048 Other nonmedicinal substance allergy status: Secondary | ICD-10-CM

## 2024-01-29 DIAGNOSIS — R002 Palpitations: Principal | ICD-10-CM

## 2024-01-29 DIAGNOSIS — I4891 Unspecified atrial fibrillation: Secondary | ICD-10-CM | POA: Diagnosis present

## 2024-01-29 DIAGNOSIS — Z6830 Body mass index (BMI) 30.0-30.9, adult: Secondary | ICD-10-CM

## 2024-01-29 DIAGNOSIS — Z5986 Financial insecurity: Secondary | ICD-10-CM

## 2024-01-29 DIAGNOSIS — Z7951 Long term (current) use of inhaled steroids: Secondary | ICD-10-CM

## 2024-01-29 DIAGNOSIS — M810 Age-related osteoporosis without current pathological fracture: Secondary | ICD-10-CM | POA: Diagnosis present

## 2024-01-29 DIAGNOSIS — Z7901 Long term (current) use of anticoagulants: Secondary | ICD-10-CM

## 2024-01-29 DIAGNOSIS — Z860101 Personal history of adenomatous and serrated colon polyps: Secondary | ICD-10-CM

## 2024-01-29 DIAGNOSIS — Z981 Arthrodesis status: Secondary | ICD-10-CM

## 2024-01-29 DIAGNOSIS — Z8572 Personal history of non-Hodgkin lymphomas: Secondary | ICD-10-CM

## 2024-01-29 DIAGNOSIS — Z833 Family history of diabetes mellitus: Secondary | ICD-10-CM

## 2024-01-29 DIAGNOSIS — E66811 Obesity, class 1: Secondary | ICD-10-CM | POA: Diagnosis present

## 2024-01-29 DIAGNOSIS — Z91018 Allergy to other foods: Secondary | ICD-10-CM

## 2024-01-29 DIAGNOSIS — Z885 Allergy status to narcotic agent status: Secondary | ICD-10-CM

## 2024-01-29 DIAGNOSIS — I2489 Other forms of acute ischemic heart disease: Secondary | ICD-10-CM | POA: Diagnosis present

## 2024-01-29 DIAGNOSIS — H903 Sensorineural hearing loss, bilateral: Secondary | ICD-10-CM | POA: Diagnosis present

## 2024-01-29 DIAGNOSIS — Z723 Lack of physical exercise: Secondary | ICD-10-CM

## 2024-01-29 DIAGNOSIS — Z96643 Presence of artificial hip joint, bilateral: Secondary | ICD-10-CM | POA: Diagnosis present

## 2024-01-29 DIAGNOSIS — I480 Paroxysmal atrial fibrillation: Secondary | ICD-10-CM | POA: Diagnosis present

## 2024-01-29 DIAGNOSIS — Z1152 Encounter for screening for COVID-19: Secondary | ICD-10-CM

## 2024-01-29 DIAGNOSIS — Z79899 Other long term (current) drug therapy: Secondary | ICD-10-CM

## 2024-01-29 DIAGNOSIS — I472 Ventricular tachycardia, unspecified: Secondary | ICD-10-CM | POA: Diagnosis present

## 2024-01-29 DIAGNOSIS — E782 Mixed hyperlipidemia: Secondary | ICD-10-CM | POA: Diagnosis present

## 2024-01-29 DIAGNOSIS — E876 Hypokalemia: Secondary | ICD-10-CM | POA: Diagnosis present

## 2024-01-29 DIAGNOSIS — I080 Rheumatic disorders of both mitral and aortic valves: Secondary | ICD-10-CM | POA: Diagnosis present

## 2024-01-29 DIAGNOSIS — Z888 Allergy status to other drugs, medicaments and biological substances status: Secondary | ICD-10-CM

## 2024-01-29 DIAGNOSIS — I442 Atrioventricular block, complete: Secondary | ICD-10-CM | POA: Diagnosis present

## 2024-01-29 DIAGNOSIS — G894 Chronic pain syndrome: Secondary | ICD-10-CM | POA: Diagnosis present

## 2024-01-29 DIAGNOSIS — J45909 Unspecified asthma, uncomplicated: Secondary | ICD-10-CM | POA: Diagnosis present

## 2024-01-29 DIAGNOSIS — Z87891 Personal history of nicotine dependence: Secondary | ICD-10-CM

## 2024-01-29 DIAGNOSIS — I43 Cardiomyopathy in diseases classified elsewhere: Secondary | ICD-10-CM | POA: Diagnosis present

## 2024-01-29 DIAGNOSIS — K900 Celiac disease: Secondary | ICD-10-CM | POA: Diagnosis present

## 2024-01-29 DIAGNOSIS — I2510 Atherosclerotic heart disease of native coronary artery without angina pectoris: Secondary | ICD-10-CM | POA: Diagnosis present

## 2024-01-29 DIAGNOSIS — E854 Organ-limited amyloidosis: Secondary | ICD-10-CM | POA: Diagnosis present

## 2024-01-29 HISTORY — DX: Unspecified atrial fibrillation: I48.91

## 2024-01-29 LAB — CBC WITH DIFFERENTIAL/PLATELET
Abs Immature Granulocytes: 0.05 K/uL (ref 0.00–0.07)
Basophils Absolute: 0.1 K/uL (ref 0.0–0.1)
Basophils Relative: 1 %
Eosinophils Absolute: 0.2 K/uL (ref 0.0–0.5)
Eosinophils Relative: 2 %
HCT: 40.1 % (ref 36.0–46.0)
Hemoglobin: 13.9 g/dL (ref 12.0–15.0)
Immature Granulocytes: 1 %
Lymphocytes Relative: 12 %
Lymphs Abs: 1 K/uL (ref 0.7–4.0)
MCH: 30.2 pg (ref 26.0–34.0)
MCHC: 34.7 g/dL (ref 30.0–36.0)
MCV: 87.2 fL (ref 80.0–100.0)
Monocytes Absolute: 0.9 K/uL (ref 0.1–1.0)
Monocytes Relative: 10 %
Neutro Abs: 6.6 K/uL (ref 1.7–7.7)
Neutrophils Relative %: 74 %
Platelets: 263 K/uL (ref 150–400)
RBC: 4.6 MIL/uL (ref 3.87–5.11)
RDW: 12.9 % (ref 11.5–15.5)
WBC: 8.8 K/uL (ref 4.0–10.5)
nRBC: 0 % (ref 0.0–0.2)

## 2024-01-29 LAB — I-STAT CHEM 8, ED
BUN: 6 mg/dL — ABNORMAL LOW (ref 8–23)
Calcium, Ion: 1.12 mmol/L — ABNORMAL LOW (ref 1.15–1.40)
Chloride: 98 mmol/L (ref 98–111)
Creatinine, Ser: 0.6 mg/dL (ref 0.44–1.00)
Glucose, Bld: 111 mg/dL — ABNORMAL HIGH (ref 70–99)
HCT: 42 % (ref 36.0–46.0)
Hemoglobin: 14.3 g/dL (ref 12.0–15.0)
Potassium: 2.7 mmol/L — CL (ref 3.5–5.1)
Sodium: 137 mmol/L (ref 135–145)
TCO2: 23 mmol/L (ref 22–32)

## 2024-01-29 LAB — COMPREHENSIVE METABOLIC PANEL WITH GFR
ALT: 22 U/L (ref 0–44)
AST: 27 U/L (ref 15–41)
Albumin: 3.7 g/dL (ref 3.5–5.0)
Alkaline Phosphatase: 47 U/L (ref 38–126)
Anion gap: 11 (ref 5–15)
BUN: 7 mg/dL — ABNORMAL LOW (ref 8–23)
CO2: 25 mmol/L (ref 22–32)
Calcium: 9.4 mg/dL (ref 8.9–10.3)
Chloride: 99 mmol/L (ref 98–111)
Creatinine, Ser: 0.69 mg/dL (ref 0.44–1.00)
GFR, Estimated: >60 mL/min (ref >60)
Glucose, Bld: 112 mg/dL — ABNORMAL HIGH (ref 70–99)
Potassium: 2.8 mmol/L — ABNORMAL LOW (ref 3.5–5.1)
Sodium: 135 mmol/L (ref 135–145)
Total Bilirubin: 0.6 mg/dL (ref 0.0–1.2)
Total Protein: 6.7 g/dL (ref 6.5–8.1)

## 2024-01-29 LAB — CBC
Hematocrit: 41.8 % (ref 34.0–46.6)
Hemoglobin: 13.8 g/dL (ref 11.1–15.9)
MCH: 29.7 pg (ref 26.6–33.0)
MCHC: 33 g/dL (ref 31.5–35.7)
MCV: 90 fL (ref 79–97)
Platelets: 291 x10E3/uL (ref 150–450)
RBC: 4.64 x10E6/uL (ref 3.77–5.28)
RDW: 12.9 % (ref 11.7–15.4)
WBC: 9.6 x10E3/uL (ref 3.4–10.8)

## 2024-01-29 LAB — BASIC METABOLIC PANEL WITH GFR
BUN/Creatinine Ratio: 13 (ref 12–28)
BUN: 8 mg/dL (ref 8–27)
CO2: 25 mmol/L (ref 20–29)
Calcium: 10.3 mg/dL (ref 8.7–10.3)
Chloride: 96 mmol/L (ref 96–106)
Creatinine, Ser: 0.61 mg/dL (ref 0.57–1.00)
Glucose: 109 mg/dL — ABNORMAL HIGH (ref 70–99)
Potassium: 3.7 mmol/L (ref 3.5–5.2)
Sodium: 139 mmol/L (ref 134–144)
eGFR: 93 mL/min/1.73 (ref >59)

## 2024-01-29 LAB — TROPONIN I (HIGH SENSITIVITY)
Troponin I (High Sensitivity): 53 ng/L — ABNORMAL HIGH (ref <18)
Troponin I (High Sensitivity): 91 ng/L — ABNORMAL HIGH (ref <18)

## 2024-01-29 LAB — RESP PANEL BY RT-PCR (RSV, FLU A&B, COVID)  RVPGX2
Influenza A by PCR: NEGATIVE
Influenza B by PCR: NEGATIVE
Resp Syncytial Virus by PCR: NEGATIVE
SARS Coronavirus 2 by RT PCR: NEGATIVE

## 2024-01-29 LAB — BRAIN NATRIURETIC PEPTIDE: B Natriuretic Peptide: 116.4 pg/mL — ABNORMAL HIGH (ref 0.0–100.0)

## 2024-01-29 LAB — MAGNESIUM: Magnesium: 1.6 mg/dL — ABNORMAL LOW (ref 1.7–2.4)

## 2024-01-29 MED ORDER — ROSUVASTATIN CALCIUM 20 MG PO TABS
20.0000 mg | ORAL_TABLET | Freq: Every day | ORAL | Status: DC
  Administered 2024-01-30 – 2024-02-03 (×5): 20 mg via ORAL
  Filled 2024-01-29 (×6): qty 1

## 2024-01-29 MED ORDER — MAGNESIUM SULFATE 4 GM/100ML IV SOLN
4.0000 g | Freq: Once | INTRAVENOUS | Status: AC
  Administered 2024-01-29: 4 g via INTRAVENOUS
  Filled 2024-01-29: qty 100

## 2024-01-29 MED ORDER — ONDANSETRON HCL 4 MG/2ML IJ SOLN: 4.0000 mg | Freq: Four times a day (QID) | INTRAMUSCULAR | Status: DC | PRN

## 2024-01-29 MED ORDER — BUDESON-GLYCOPYRROL-FORMOTEROL 160-9-4.8 MCG/ACT IN AERO
2.0000 | INHALATION_SPRAY | Freq: Two times a day (BID) | RESPIRATORY_TRACT | Status: DC
  Administered 2024-01-30 – 2024-02-02 (×5): 2 via RESPIRATORY_TRACT
  Filled 2024-01-29: qty 5.9

## 2024-01-29 MED ORDER — APIXABAN 5 MG PO TABS
5.0000 mg | ORAL_TABLET | Freq: Two times a day (BID) | ORAL | Status: DC
  Administered 2024-01-29 – 2024-01-30 (×2): 5 mg via ORAL
  Filled 2024-01-29 (×2): qty 1

## 2024-01-29 MED ORDER — PANTOPRAZOLE SODIUM 40 MG PO TBEC
40.0000 mg | DELAYED_RELEASE_TABLET | Freq: Every day | ORAL | Status: DC
  Administered 2024-01-29 – 2024-02-03 (×6): 40 mg via ORAL
  Filled 2024-01-29 (×6): qty 1

## 2024-01-29 MED ORDER — POTASSIUM CHLORIDE 10 MEQ/100ML IV SOLN: 10.0000 meq | INTRAVENOUS | Status: DC

## 2024-01-29 MED ORDER — POTASSIUM CHLORIDE CRYS ER 20 MEQ PO TBCR
40.0000 meq | EXTENDED_RELEASE_TABLET | Freq: Once | ORAL | Status: AC
  Administered 2024-01-29: 40 meq via ORAL
  Filled 2024-01-29: qty 2

## 2024-01-29 MED ORDER — ACETAMINOPHEN 325 MG PO TABS
650.0000 mg | ORAL_TABLET | ORAL | Status: DC | PRN
  Administered 2024-01-30 – 2024-02-03 (×6): 650 mg via ORAL
  Filled 2024-01-29 (×6): qty 2

## 2024-01-29 MED ORDER — METOPROLOL SUCCINATE ER 100 MG PO TB24
100.0000 mg | ORAL_TABLET | Freq: Every day | ORAL | Status: DC
  Administered 2024-01-30 – 2024-02-03 (×5): 100 mg via ORAL
  Filled 2024-01-29: qty 4
  Filled 2024-01-29: qty 1
  Filled 2024-01-29 (×3): qty 4

## 2024-01-29 MED ORDER — ZOLPIDEM TARTRATE 5 MG PO TABS
5.0000 mg | ORAL_TABLET | Freq: Every day | ORAL | Status: DC
  Administered 2024-01-29 – 2024-02-02 (×5): 5 mg via ORAL
  Filled 2024-01-29 (×6): qty 1

## 2024-01-29 MED ORDER — POTASSIUM CHLORIDE 10 MEQ/100ML IV SOLN
10.0000 meq | INTRAVENOUS | Status: AC
  Administered 2024-01-30: 10 meq via INTRAVENOUS
  Filled 2024-01-29: qty 100

## 2024-01-29 MED ORDER — POTASSIUM CHLORIDE 10 MEQ/100ML IV SOLN
10.0000 meq | Freq: Once | INTRAVENOUS | Status: AC
  Administered 2024-01-29: 10 meq via INTRAVENOUS
  Filled 2024-01-29: qty 100

## 2024-01-29 MED ORDER — LOSARTAN POTASSIUM 50 MG PO TABS
50.0000 mg | ORAL_TABLET | Freq: Every day | ORAL | Status: DC
  Administered 2024-01-29 – 2024-02-03 (×6): 50 mg via ORAL
  Filled 2024-01-29 (×6): qty 1

## 2024-01-29 NOTE — ED Triage Notes (Signed)
 Patient bib EMS from home with complaints of sudden onset of shortness of breath and dizziness. EMS reports her rate and rhythm are all over the place. She has been in a-fib  RVR, A-flutter, she has a L bundle and has been having numerous PAC's and PVC's.  She is due to have a pacemaker placement this coming Fridat 9/5.   Received 500 NS bolus enroute

## 2024-01-29 NOTE — ED Provider Notes (Signed)
 [place] EMERGENCY DEPARTMENT AT [facility] Provider Note   CSN: [id number] Arrival date & time: 01/29/24  1612     Patient presents with: Atrial Fibrillation   Deanna [name] is a 78 y.o. female.   78 year old female with prior medical history as detailed below presents for evaluation.  Patient with complaint of rapid heart rate and dizziness.  EMS reports that she may have been in A-fib with RVR.  Patient was given 500 mL of normal saline during transport.  On arrival she feels improved.  She reports that she is scheduled for pacemaker placement on Friday of this upcoming week.  The history is provided by the patient and medical records.       Prior to Admission medications   Medication Sig Start Date End Date Taking? Authorizing Provider  albuterol  (VENTOLIN  HFA) 108 (90 Base) MCG/ACT inhaler Inhale 2 puffs into the lungs every 6 (six) hours as needed for wheezing or shortness of breath. 05/21/23   Assaker, Darrin, MD  apixaban  (ELIQUIS ) 5 MG TABS tablet Take 1 tablet (5 mg total) by mouth 2 (two) times daily. Hold medication from 9/15 to 9/21 for right hip surgery 09/20/23   Madireddy, Alean SAUNDERS, MD  Ascorbic Acid  (VITAMIN C ) 1000 MG tablet Take 4,000 mg by mouth daily.    [provider]  budesonide -glycopyrrolate -formoterol  (BREZTRI  AEROSPHERE) 160-9-4.8 MCG/ACT AERO inhaler Inhale 2 puffs into the lungs in the morning and at bedtime. 01/11/24   Cobb, Comer GAILS, NP  budesonide -glycopyrrolate -formoterol  (BREZTRI  AEROSPHERE) 160-9-4.8 MCG/ACT AERO inhaler Inhale 2 puffs into the lungs in the morning and at bedtime. 01/11/24   Cobb, Comer GAILS, NP  Calcium -Magnesium  (CAL-MAG PO) Take 2,000 mg by mouth at bedtime. 1000 mg    [provider]  Carboxymethylcellul-Glycerin  (CLEAR EYES FOR DRY EYES OP) Place 1 drop into both eyes daily.    [provider]  fluorouracil (EFUDEX) 5 % cream Apply 1 Application topically 2 (two) times daily.  09/14/23   [provider]  hydrochlorothiazide  (HYDRODIURIL ) 25 MG tablet Take 1 tablet (25 mg total) by mouth 2 (two) times daily. 10/07/20   Georgina Speaks, FNP  hydrocortisone  2.5 % cream Apply topically as needed (hemmeroids). 10/03/23   [provider]  imiquimod LARETTA) 5 % cream as needed (rash).    [provider]  loratadine  (CLARITIN ) 10 MG tablet Take 10 mg by mouth daily as needed for allergies.    [provider]  losartan  (COZAAR ) 50 MG tablet TAKE 1 TABLET BY MOUTH EVERY DAY 07/15/23   Georgina Speaks, FNP  metoprolol  succinate (TOPROL -XL) 100 MG 24 hr tablet Take 1 tablet (100 mg total) by mouth daily. Take with or immediately following a meal. 12/06/23   Chandrasekhar, Mahesh A, MD  milk thistle 175 MG tablet Take 175 mg by mouth daily.    [provider]  nitroGLYCERIN  (NITROSTAT ) 0.4 MG SL tablet Place 1 tablet (0.4 mg total) under the tongue every 5 (five) minutes x 3 doses as needed for chest pain. 02/11/23   Claudene Pacific, MD  nystatin  (MYCOSTATIN ) 100000 UNIT/ML suspension Take 5 mLs (500,000 Units total) by mouth 4 (four) times daily. 01/20/24   Cobb, Comer GAILS, NP  omeprazole  (PRILOSEC) 40 MG capsule Take 1 capsule (40 mg total) by mouth daily. 01/11/24   Cobb, Comer GAILS, NP  Probiotic Product (PROBIOTIC DAILY PO) Take 1 tablet by mouth daily.    [provider]  rosuvastatin  (CRESTOR ) 20 MG tablet Take 1 tablet (20  mg total) by mouth daily. 02/12/23   Claudene Pacific, MD  triamcinolone cream (KENALOG) 0.1 % Apply topically as needed (rash). 04/07/18   [provider]  Vitamin A  2400 MCG (8000 UT) CAPS Take 16,000 Units by mouth daily. 8000 units    [provider]  Vitamin D, Ergocalciferol, (DRISDOL) 1.25 MG (50000 UNIT) CAPS capsule Take 50,000 Units by mouth once a week.    [provider]  zolpidem  (AMBIEN ) 5 MG tablet Take 5 mg by mouth at bedtime. 01/06/24   [provider]    Allergies:  Codeine, Gabapentin , Gluten meal, Dust mite extract, Sulfa antibiotics, and Terbinafine hcl    Review of Systems  All other systems reviewed and are negative.   Updated Vital Signs BP 126/63   Pulse 95   Temp 97.6 F (36.4 C) (Oral)   Resp 17   Ht 5' 2 (1.575 m)   Wt 74.8 kg   SpO2 97%   BMI 30.18 kg/m   Physical Exam Vitals and nursing note reviewed.  Constitutional:      General: She is not in acute distress.    Appearance: Normal appearance. She is well-developed.  HENT:     Head: Normocephalic and atraumatic.  Eyes:     Conjunctiva/sclera: Conjunctivae normal.     Pupils: Pupils are equal, round, and reactive to light.  Cardiovascular:     Rate and Rhythm: Normal rate and regular rhythm.     Heart sounds: Normal heart sounds.  Pulmonary:     Effort: Pulmonary effort is normal. No respiratory distress.     Breath sounds: Normal breath sounds.  Abdominal:     General: There is no distension.     Palpations: Abdomen is soft.     Tenderness: There is no abdominal tenderness.  Musculoskeletal:        General: No deformity. Normal range of motion.     Cervical back: Normal range of motion and neck supple.  Skin:    General: Skin is warm and dry.  Neurological:     General: No focal deficit present.     Mental Status: She is alert and oriented to [place], place, and time.     (all labs ordered are listed, but only abnormal results are displayed) Labs Reviewed  BRAIN NATRIURETIC PEPTIDE - Abnormal; Notable for the following components:      Result Value   B Natriuretic Peptide 116.4 (*)    All other components within normal limits  COMPREHENSIVE METABOLIC PANEL WITH GFR - Abnormal; Notable for the following components:   Potassium 2.8 (*)    Glucose, Bld 112 (*)    BUN 7 (*)    All other components within normal limits  I-STAT CHEM 8, ED - Abnormal; Notable for the following components:   Potassium 2.7 (*)    BUN 6 (*)    Glucose, Bld 111 (*)    Calcium ,  Ion 1.12 (*)    All other components within normal limits  TROPONIN I (HIGH SENSITIVITY) - Abnormal; Notable for the following components:   Troponin I (High Sensitivity) 53 (*)    All other components within normal limits  RESP PANEL BY RT-PCR (RSV, FLU A&B, COVID)  RVPGX2  CBC WITH DIFFERENTIAL/PLATELET  MAGNESIUM   TROPONIN I (HIGH SENSITIVITY)    EKG: EKG Interpretation Date/Time:  Saturday January 29 2024 16:33:09 EDT Ventricular Rate:  98 PR Interval:    QRS Duration:  146 QT Interval:  396 QTC Calculation: 506 R Axis:   -  21  Text Interpretation: Accelerated junctional rhythm Left bundle branch block Confirmed by Laurice Coy [phone]) on 01/29/2024 5:24:04 PM  Radiology: No results found.   Procedures   Medications Ordered in the ED  potassium chloride  10 mEq in 100 mL IVPB (has no administration in time range)  potassium chloride  SA (KLOR-CON  M) CR tablet 40 mEq (has no administration in time range)                                    Medical Decision Making Patient with reported palpitations and suspected A-fib.  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 tach.  Patient is comfortable on initial evaluation.  Potassium is 2.7.  Repletion started in the ED.  Cardiology made aware of case and will evaluate for admission.  Amount and/or Complexity of Data Reviewed Labs: ordered. Radiology: ordered.  Risk Prescription drug management. Decision regarding hospitalization.    CRITICAL CARE Performed by: Coy JAYSON Laurice   Total critical care time: 30 minutes  Critical care time was exclusive of separately billable procedures and treating other patients.  Critical care was necessary to treat or prevent imminent or life-threatening deterioration.  Critical care was time spent personally by me on the following activities: development of treatment plan with patient and/or surrogate as well as nursing,  discussions with consultants, evaluation of patient's response to treatment, examination of patient, obtaining history from patient or surrogate, ordering and performing treatments and interventions, ordering and review of laboratory studies, ordering and review of radiographic studies, pulse oximetry and re-evaluation of patient's condition.       Final diagnoses:  Palpitations  Hypokalemia    ED Discharge Orders     None          Laurice Coy JAYSON, MD 01/29/24 1950

## 2024-01-29 NOTE — ED Notes (Signed)
 CCMD called and pt put on monitor.

## 2024-01-30 DIAGNOSIS — Z7901 Long term (current) use of anticoagulants: Secondary | ICD-10-CM | POA: Diagnosis not present

## 2024-01-30 DIAGNOSIS — I700 Atherosclerosis of aorta: Secondary | ICD-10-CM | POA: Diagnosis present

## 2024-01-30 DIAGNOSIS — I480 Paroxysmal atrial fibrillation: Secondary | ICD-10-CM | POA: Diagnosis present

## 2024-01-30 DIAGNOSIS — Z96643 Presence of artificial hip joint, bilateral: Secondary | ICD-10-CM | POA: Diagnosis present

## 2024-01-30 DIAGNOSIS — I43 Cardiomyopathy in diseases classified elsewhere: Secondary | ICD-10-CM | POA: Diagnosis present

## 2024-01-30 DIAGNOSIS — E854 Organ-limited amyloidosis: Secondary | ICD-10-CM | POA: Diagnosis present

## 2024-01-30 DIAGNOSIS — I4891 Unspecified atrial fibrillation: Secondary | ICD-10-CM

## 2024-01-30 DIAGNOSIS — I2489 Other forms of acute ischemic heart disease: Secondary | ICD-10-CM | POA: Diagnosis present

## 2024-01-30 DIAGNOSIS — E782 Mixed hyperlipidemia: Secondary | ICD-10-CM | POA: Diagnosis present

## 2024-01-30 DIAGNOSIS — I440 Atrioventricular block, first degree: Secondary | ICD-10-CM | POA: Diagnosis not present

## 2024-01-30 DIAGNOSIS — I472 Ventricular tachycardia, unspecified: Secondary | ICD-10-CM | POA: Diagnosis present

## 2024-01-30 DIAGNOSIS — Z833 Family history of diabetes mellitus: Secondary | ICD-10-CM | POA: Diagnosis not present

## 2024-01-30 DIAGNOSIS — I080 Rheumatic disorders of both mitral and aortic valves: Secondary | ICD-10-CM | POA: Diagnosis present

## 2024-01-30 DIAGNOSIS — E66811 Obesity, class 1: Secondary | ICD-10-CM | POA: Diagnosis present

## 2024-01-30 DIAGNOSIS — I447 Left bundle-branch block, unspecified: Secondary | ICD-10-CM | POA: Diagnosis present

## 2024-01-30 DIAGNOSIS — I10 Essential (primary) hypertension: Secondary | ICD-10-CM | POA: Diagnosis present

## 2024-01-30 DIAGNOSIS — J45909 Unspecified asthma, uncomplicated: Secondary | ICD-10-CM | POA: Diagnosis present

## 2024-01-30 DIAGNOSIS — I2510 Atherosclerotic heart disease of native coronary artery without angina pectoris: Secondary | ICD-10-CM | POA: Diagnosis present

## 2024-01-30 DIAGNOSIS — I495 Sick sinus syndrome: Principal | ICD-10-CM

## 2024-01-30 DIAGNOSIS — Z1152 Encounter for screening for COVID-19: Secondary | ICD-10-CM | POA: Diagnosis not present

## 2024-01-30 DIAGNOSIS — R001 Bradycardia, unspecified: Secondary | ICD-10-CM | POA: Diagnosis not present

## 2024-01-30 DIAGNOSIS — G894 Chronic pain syndrome: Secondary | ICD-10-CM | POA: Diagnosis present

## 2024-01-30 DIAGNOSIS — I442 Atrioventricular block, complete: Secondary | ICD-10-CM | POA: Diagnosis present

## 2024-01-30 DIAGNOSIS — E876 Hypokalemia: Secondary | ICD-10-CM | POA: Diagnosis present

## 2024-01-30 DIAGNOSIS — Z87891 Personal history of nicotine dependence: Secondary | ICD-10-CM | POA: Diagnosis not present

## 2024-01-30 DIAGNOSIS — Z6830 Body mass index (BMI) 30.0-30.9, adult: Secondary | ICD-10-CM | POA: Diagnosis not present

## 2024-01-30 DIAGNOSIS — Z8572 Personal history of non-Hodgkin lymphomas: Secondary | ICD-10-CM | POA: Diagnosis not present

## 2024-01-30 LAB — CBC
HCT: 39.1 % (ref 36.0–46.0)
Hemoglobin: 13 g/dL (ref 12.0–15.0)
MCH: 29.1 pg (ref 26.0–34.0)
MCHC: 33.2 g/dL (ref 30.0–36.0)
MCV: 87.7 fL (ref 80.0–100.0)
Platelets: 272 K/uL (ref 150–400)
RBC: 4.46 MIL/uL (ref 3.87–5.11)
RDW: 13.2 % (ref 11.5–15.5)
WBC: 6.8 K/uL (ref 4.0–10.5)
nRBC: 0 % (ref 0.0–0.2)

## 2024-01-30 LAB — BASIC METABOLIC PANEL WITH GFR
Anion gap: 13 (ref 5–15)
BUN: 7 mg/dL — ABNORMAL LOW (ref 8–23)
CO2: 21 mmol/L — ABNORMAL LOW (ref 22–32)
Calcium: 9.5 mg/dL (ref 8.9–10.3)
Chloride: 100 mmol/L (ref 98–111)
Creatinine, Ser: 0.65 mg/dL (ref 0.44–1.00)
GFR, Estimated: >60 mL/min (ref >60)
Glucose, Bld: 99 mg/dL (ref 70–99)
Potassium: 4.8 mmol/L (ref 3.5–5.1)
Sodium: 134 mmol/L — ABNORMAL LOW (ref 135–145)

## 2024-01-30 LAB — HEPARIN LEVEL (UNFRACTIONATED): Heparin Unfractionated: >1.1 [IU]/mL — ABNORMAL HIGH (ref 0.30–0.70)

## 2024-01-30 LAB — APTT: aPTT: 34 s (ref 24–36)

## 2024-01-30 MED ORDER — HEPARIN (PORCINE) 25000 UT/250ML-% IV SOLN
700.0000 [IU]/h | INTRAVENOUS | Status: AC
  Administered 2024-01-30: 900 [IU]/h via INTRAVENOUS
  Filled 2024-01-30: qty 250

## 2024-01-30 MED ORDER — HEPARIN BOLUS VIA INFUSION: 3300.0000 [IU] | Freq: Once | INTRAVENOUS | Status: DC

## 2024-01-30 MED ORDER — HEPARIN (PORCINE) 25000 UT/250ML-% IV SOLN: 900.0000 [IU]/h | INTRAVENOUS | Status: DC

## 2024-01-30 NOTE — H&P (Signed)
 Cardiology Admission History and Physical:    Patient ID: Deanna [name] MRN: [id number]; DOB: [birth year]   Admission date: 01/29/2024  Primary Care Provider: Delores Rojelio Caldron, NP Primary Cardiologist: None  Primary Electrophysiologist:  None   Chief Complaint: Shortness of breath  Patient Profile:   Deanna [name] is a 78 y.o. 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  History of Present Illness:   Deanna [name] states that earlier today all of a sudden she felt like she was very short of breath.  This was not incited by anything.  She called the ambulance and was found to be in atrial fibrillation with a heart rate up to 130 bpm.  She was given fluid and brought to the emergency room.  At the time of my evaluation she was already in sinus rhythm. Deanna [name] often has episodes of shortness of breath however she states that this episode was different because her heart rate was fast instead of slow.  She is scheduled for a pacemaker defibrillator insertion this next week on February 04, 2024.   Past Medical History:  Diagnosis Date   A-fib (HCC)    Abnormal glucose 03/06/2020   Acute coronary syndrome (HCC) 02/09/2023   Age-related osteoporosis without current pathological fracture 11/22/2015   Overview:  Femoral neck  Formatting of this note might be different from the original. 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 Added automatically from request for surgery [id number]   Chronic pain syndrome 04/01/2015   Colon polyps    Dyspnea    Essential hypertension  07/20/2000   Overview:  HBP   Family history of adverse reaction to anesthesia    mother delerium   Female cystocele 02/27/2009   GAD (generalized anxiety disorder) 08/19/2015   GERD (gastroesophageal reflux disease)    Heart murmur    History of Bell's palsy 03/21/2015   Hx of adenomatous colonic polyps 12/20/2008   Overview:  Colonoscopy 07/2003 - [facility] - polyps removed - recommended repeat in 3 years Colonoscopy 09/2011 - Dr. Brenita - hyperplastic polyp(s) were removed - repeat in 5 years   Hypertension    Incomplete uterovaginal prolapse 03/04/2004   Insomnia    Left bundle-branch block 05/20/2006   Lumbar radiculopathy 01/09/2016   Formatting of this note might be different from the original. 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 Femoral neck   Other intervertebral disc degeneration, lumbar region 01/29/2015   Pre-operative cardiovascular examination 09/03/2020   Primary osteoarthritis, right hand 02/28/2002   Overview:  OA HANDS, ESP THUMBS   Radial tunnel syndrome, right 11/04/2016   Radiculopathy, lumbar region 11/22/2014   Overview:  Added automatically from request for surgery [id number]   Rhinitis, allergic 02/24/2002   Overview:  ALLERGY SYMPTOMS -  states was seen by allergist in past and informed allergic to dust   S/P left total hip arthroplasty 09/17/2020   S/P total right hip arthroplasty 02/18/2023   Sacroiliitis (HCC) 08/02/2019   Screening for colon cancer 04/17/2005   Formatting of this note might be different from the original. Colonoscopy 2005   Sensorineural hearing loss (SNHL) of both ears 07/05/2019   pt can hear  normally   Short sleeper 04/29/2020   Snoring 04/29/2020   Spinal stenosis    Spinal stenosis, lumbar region without neurogenic claudication 11/22/2014   Tinnitus aurium, bilateral 07/05/2019   Trigger thumb, unspecified thumb 02/28/2002   Formatting of this note might be different from the original. TRIGGER THUMBS - started spring 2003. Injected 02/28/02   Unspecified asthma, uncomplicated 12/25/2004    Past Surgical History:  Procedure Laterality Date   ABDOMINAL HYSTERECTOMY  2010   APPENDECTOMY  1969   CHOLECYSTECTOMY  1969   SPINAL FUSION  01/2016   TONSILLECTOMY AND ADENOIDECTOMY  1956   TOTAL HIP ARTHROPLASTY Left 09/17/2020   Procedure: TOTAL HIP ARTHROPLASTY ANTERIOR APPROACH;  Surgeon: Ernie Cough, MD;  Location: [facility];  Service: Orthopedics;  Laterality: Left;  70 mins   TOTAL HIP ARTHROPLASTY Right 02/18/2023   Procedure: TOTAL HIP ARTHROPLASTY ANTERIOR APPROACH;  Surgeon: Ernie Cough, MD;  Location: [facility];  Service: Orthopedics;  Laterality: Right;     Medications Prior to Admission: Prior to Admission medications   Medication Sig Start Date End Date Taking? Authorizing Provider  albuterol  (VENTOLIN  HFA) 108 (90 Base) MCG/ACT inhaler Inhale 2 puffs into the lungs every 6 (six) hours as needed for wheezing or shortness of breath. 05/21/23   Assaker, Darrin, MD  apixaban  (ELIQUIS ) 5 MG TABS tablet Take 1 tablet (5 mg total) by mouth 2 (two) times daily. Hold medication from 9/15 to 9/21 for right hip surgery 09/20/23   Madireddy, Alean SAUNDERS, MD  Ascorbic Acid  (VITAMIN C ) 1000 MG tablet Take 4,000 mg by mouth daily.    [provider]  budesonide -glycopyrrolate -formoterol  (BREZTRI  AEROSPHERE) 160-9-4.8 MCG/ACT AERO inhaler Inhale 2 puffs into the lungs in the morning and at bedtime. 01/11/24   Cobb, Comer GAILS, NP  budesonide -glycopyrrolate -formoterol  (BREZTRI  AEROSPHERE) 160-9-4.8 MCG/ACT AERO inhaler Inhale 2 puffs into the lungs in the morning and at  bedtime. 01/11/24   Cobb, Comer GAILS, NP  Calcium -Magnesium  (CAL-MAG PO) Take 2,000 mg by mouth at bedtime. 1000 mg    [provider]  Carboxymethylcellul-Glycerin  (CLEAR EYES FOR DRY EYES OP) Place 1 drop into both eyes daily.    [provider]  fluorouracil (EFUDEX) 5 % cream Apply 1 Application topically 2 (two) times daily. 09/14/23   [provider]  hydrochlorothiazide  (HYDRODIURIL ) 25 MG tablet Take 1 tablet (25 mg total) by mouth 2 (two) times daily. 10/07/20   Georgina Speaks, FNP  hydrocortisone  2.5 % cream Apply topically as needed (hemmeroids). 10/03/23   [provider]  imiquimod LARETTA) 5 % cream as needed (rash).    [provider]  loratadine  (CLARITIN ) 10 MG tablet Take 10 mg by mouth daily as needed for allergies.    [provider]  losartan  (COZAAR ) 50 MG tablet TAKE 1 TABLET BY MOUTH EVERY DAY 07/15/23   Georgina Speaks, FNP  metoprolol  succinate (TOPROL -XL) 100 MG 24 hr tablet Take 1 tablet (100 mg total) by mouth daily. Take with or immediately following a meal. 12/06/23   Chandrasekhar, Mahesh A, MD  milk thistle 175 MG tablet Take 175 mg  by mouth daily.    [provider]  nitroGLYCERIN  (NITROSTAT ) 0.4 MG SL tablet Place 1 tablet (0.4 mg total) under the tongue every 5 (five) minutes x 3 doses as needed for chest pain. 02/11/23   Claudene Pacific, MD  nystatin  (MYCOSTATIN ) 100000 UNIT/ML suspension Take 5 mLs (500,000 Units total) by mouth 4 (four) times daily. 01/20/24   Cobb, Comer GAILS, NP  omeprazole  (PRILOSEC) 40 MG capsule Take 1 capsule (40 mg total) by mouth daily. 01/11/24   Cobb, Comer GAILS, NP  Probiotic Product (PROBIOTIC DAILY PO) Take 1 tablet by mouth daily.    [provider]  rosuvastatin  (CRESTOR ) 20 MG tablet Take 1 tablet (20 mg total) by mouth daily. 02/12/23   Claudene Pacific, MD  triamcinolone cream (KENALOG) 0.1 % Apply topically as needed (rash). 04/07/18   [provider]  Vitamin A   2400 MCG (8000 UT) CAPS Take 16,000 Units by mouth daily. 8000 units    [provider]  Vitamin D, Ergocalciferol, (DRISDOL) 1.25 MG (50000 UNIT) CAPS capsule Take 50,000 Units by mouth once a week.    [provider]  zolpidem  (AMBIEN ) 5 MG tablet Take 5 mg by mouth at bedtime. 01/06/24   [provider]     Allergies:    Allergies  Allergen Reactions   Codeine Nausea And Vomiting and Palpitations     Nausea and/or Vomiting, chest pain and acid reflux Feels like she is having a heart attack.     Gabapentin  Other (See Comments)    Eyes went numb and blurry  Blurred Vision as well   Gluten Meal Diarrhea and Other (See Comments)    Severe diarrhea per member    Dust Mite Extract     Upper Respiratory issues    Sulfa Antibiotics Other (See Comments)   Terbinafine Hcl Rash    Social History:   Social History   Socioeconomic History   Marital status: Divorced    Spouse name: Not on file   Number of children: 2   Years of education: 2 years college   Highest education level: Not on file  Occupational History   Occupation: Retired  Tobacco Use   Smoking status: Former    Current packs/day: 0.00    Average packs/day: 0.5 packs/day for 30.0 years (15.0 ttl pk-yrs)    Types: Cigarettes    Start date: 01/01/1968    Quit date: 12/31/1997    Years since quitting: 26.0   Smokeless tobacco: Never   Tobacco comments:    Quit in 1999  Vaping Use   Vaping status: Never Used  Substance and Sexual Activity   Alcohol  use: Not Currently   Drug use: No   Sexual activity: Not Currently    Birth control/protection: Post-menopausal  Other Topics Concern   Not on file  Social History Narrative   Lives alone.   Right-handed.   One cup caffeine per day.   Social Drivers of Corporate investment banker Strain: High Risk (01/14/2022)   Overall Financial Resource Strain (CARDIA)    Difficulty of Paying Living Expenses: Hard  Food Insecurity: Low Risk   (08/25/2023)   Received from [facility]   Hunger Vital Sign    Within the past 12 months, you worried that your food would run out before you got money to buy more: Never true    Within the past 12 months, the food you bought just didn't last and you didn't have money to get more. : Never  true  Transportation Needs: No Transportation Needs (08/25/2023)   Received [facility]    In the past 12 months, has lack of reliable transportation kept you from medical appointments, meetings, work or from getting things needed for daily living? : No  Physical Activity: Inactive (01/14/2022)   Exercise Vital Sign    Days of Exercise per Week: 0 days    Minutes of Exercise per Session: 0 min  Stress: Stress Concern Present (01/14/2022)   [employer] of Occupational Health - Occupational Stress Questionnaire    Feeling of Stress : To some extent  Social Connections: Unknown (10/13/2021)   Received from [facility]   Social Network    Social Network: Not on file  Intimate Partner Violence: Not At Risk (02/18/2023)   Humiliation, Afraid, Rape, and Kick questionnaire    Fear of Current or Ex-Partner: No    Emotionally Abused: No    Physically Abused: No    Sexually Abused: No    Family History:   The patient's family history includes Arthritis in her mother; Diabetes in her mother and sister; Early death in her father and sister; Leukemia in her father and sister; Pancreatic cancer (age of onset: 56) in her mother.    Review of Systems: [y] = yes, [ ]  = no    General: Weight gain [ ] ; Weight loss [ ] ; Anorexia [ ] ; Fatigue [ ] ; Fever [ ] ; Chills [ ] ; Weakness [ ]   Cardiac: Chest pain/pressure [ ] ; Resting SOB [ y]; Exertional SOB [ ] ; Orthopnea [ ] ; Pedal Edema [ ] ; Palpitations [ ] ; Syncope [ ] ; Presyncope [ ] ; Paroxysmal nocturnal dyspnea[ ]   Pulmonary: Cough [ ] ; Wheezing[ ] ; Hemoptysis[ ] ; Sputum [ ] ; Snoring [ ]   GI: Vomiting[ ] ; Dysphagia[ ] ; Melena[ ] ;  Hematochezia [ ] ; Heartburn[ ] ; Abdominal pain [ ] ; Constipation [ ] ; Diarrhea [ ] ; BRBPR [ ]   GU: Hematuria[ ] ; Dysuria [ ] ; Nocturia[ ]   Vascular: Pain in legs with walking [ ] ; Pain in feet with lying flat [ ] ; Non-healing sores [ ] ; Stroke [ ] ; TIA [ ] ; Slurred speech [ ] ;  Neuro: Headaches[ ] ; Vertigo[ ] ; Seizures[ ] ; Paresthesias[ ] ;Blurred vision [ ] ; Diplopia [ ] ; Vision changes [ ]   Ortho/Skin: Arthritis [ ] ; Joint pain [ ] ; Muscle pain [ ] ; Joint swelling [ ] ; Back Pain [ ] ; Rash [ ]   Psych: Depression[ ] ; Anxiety[ ]   Heme: Bleeding problems [ ] ; Clotting disorders [ ] ; Anemia [ ]   Endocrine: Diabetes [ ] ; Thyroid  dysfunction[ ]   Physical Exam/Data:   Vitals:   01/29/24 1845 01/29/24 2003 01/29/24 2052 01/30/24 0032  BP: 113/64  122/69 (!) 131/58  Pulse: 84  86 76  Resp: 18  (!) 22 20  Temp:  97.6 F (36.4 C) 97.6 F (36.4 C) 97.6 F (36.4 C)  TempSrc:   Oral Oral  SpO2: 100%  98% 97%  Weight:      Height:       No intake or output data in the 24 hours ending 01/30/24 0126 Filed Weights   01/29/24 1622  Weight: 74.8 kg   Body mass index is 30.18 kg/m.  General:  Well nourished, well developed, in no acute distress HEENT: normal Neck: no JVD Cardiac:  normal S1, S2; Lungs:  clear to auscultation bilaterally, no wheezing, rhonchi or rales  Abd: soft, nontender, no hepatomegaly  Ext: no edema Musculoskeletal:  No deformities Skin: warm and dry  Psych:  Normal affect    EKG:  Normal sinus rhythm with first-degree AV block. Leftward axis deviation with LVH present.  Relevant CV Studies: January 19, 2024 ATTR study Findings are suggestive of cardiac ATTR amyloidosis. The myocardium was positive for radiotracer uptake.   The visual grade of myocardial uptake relative to the ribs was Grade 3 (Myocardial uptake greater than rib uptake with mild/absent rib uptake).   CT images were obtained for attenuation correction and were examined for the presence of coronary  calcium  when appropriate.   Coronary calcium  was present on the attenuation correction CT images. Moderate coronary calcifications were present. Coronary calcifications were present in the right coronary artery distribution(s). Aortic atherosclerosis noted.  Aortic Valve calcification noted.  January 06, 2024 long-term ambulatory monitor Patient had a minimum heart rate of 28 bpm, maximum heart rate of 164 bpm, and average heart rate of 78 bpm.  Predominant underlying rhythm was sinus rhythm.  Ventricular Tachycardia lasting 12 seconds.  No atrial fibrillation or flutter.  Complete heart block noted.  Symptomatic high grade AV block noted.  Laboratory Data:  Component     Latest Ref Rng 01/28/2024  Glucose     70 - 99 mg/dL 890 (H)   BUN     8 - 27 mg/dL 8   Creatinine     9.42 - 1.00 mg/dL 9.38   eGFR     >40 fO/fpw/8.26 93   BUN/Creatinine Ratio     12 - 28  13   Sodium     134 - 144 mmol/L 139   Potassium     3.5 - 5.2 mmol/L 3.7   Chloride     96 - 106 mmol/L 96   CO2     20 - 29 mmol/L 25   Calcium      8.7 - 10.3 mg/dL 89.6     Component     Latest Ref Rng 01/28/2024  WBC     3.4 - 10.8 x10E3/uL 9.6   RBC     3.77 - 5.28 x10E6/uL 4.64   Hemoglobin     11.1 - 15.9 g/dL 86.1   HCT     65.9 - 53.3 % 41.8   MCV     79 - 97 fL 90   MCH     26.6 - 33.0 pg 29.7   MCHC     31.5 - 35.7 g/dL 66.9   RDW     88.2 - 84.5 % 12.9   Platelets     150 - 450 x10E3/uL 291    Component     Latest Ref Rng 01/29/2024  B Natriuretic Peptide     0.0 - 100.0 pg/mL 116.4 (H)     Component     Latest Ref Rng 01/29/2024  Troponin I (High Sensitivity)     <18 ng/L 91 (H)   Troponin I (High Sensitivity)      53 (H)     Radiology/Studies:  DG Chest Port 1 View Result Date: 01/29/2024 CLINICAL DATA:  Shortness of breath. EXAM: PORTABLE CHEST 1 VIEW COMPARISON:  01/11/2024. FINDINGS: The heart is enlarged and the mediastinal contour is within normal limits. There is  atherosclerotic calcification of the aorta. Interstitial prominence and scattered atelectasis is noted at the lung bases. No effusion or pneumothorax is seen. No acute osseous abnormality. IMPRESSION: Mild interstitial prominence at the lung bases with subsegmental atelectasis. The possibility of edema are superimposed infection cannot be excluded. Electronically Signed   By: Leita Birmingham  M.D.   On: 01/29/2024 18:05    Assessment and Plan:   Deanna [name] is a 78 year old with ATTR amyloid, VT, atrial fibrillation, degree AV block and intermittent  pauses. She initially presented to the emergency department for shortness of breath. EMS strip with atrial fibrillation at a rate of around 105 bpm with a left bundle branch block. However after 500 mL of fluid she felt better. Upon arrival, she was in normal sinus rhythm with a first-degree AV block and left bundle branch block.  Discussed with Deanna [name] and her family that her symptoms were likely due to atrial fibrillation with rapid ventricular rate (peak heart rate per patient was at 130 bpm). Rate control would be most ideal, however when beta-blockade was increased in the past she had pauses. More recently, her ambulatory monitor from January 06, 2024 did show that she had notable episodes of symptomatic high-grade AV block which judging from the notes, and her history it seems as though her high-grade AV block is usually the cause of her symptoms not necessarily her atrial fibrillation.  Her bradycardia limits our ability to start AV nodal blockade.  For her tachy-brady syndrome she is scheduled to have a pacemaker defibrillator implanted on February 04, 2024 (in 6 days).  And so she and I discussed that initiating an antiarrhythmic not desired but may need to occur to stop her from having symptoms.  After the pacemaker we can further increase the nodal agents.   Of note, she does have elevated troponins ( 53 >91). She does have a history of elevated  troponins in the setting of arrhythmias. Echo stress test was completed in July 2025 to evaluate for outflow tract obstruction. However test did not check for wall motion abnormalities and there was at a subtarget heart rate. Her last complete stress test was a nuclear medicine stress test in September 2020 for at that point in time there was no reversible ischemia or infarction. Reviewing her cardiac MRI completed in April 2025 she does have subsubendocardial scarring. It is likely that her troponins are elevated in the setting of demand ischemia given her elevated heart rates into her severe left ventricular hypertrophy. A coronary CT should be considered in the future.   Hypokalemia -Hold hydrochlorothiazide  -Replete potassium with goal of 4  Severity of Illness: The appropriate patient status for this patient is OBSERVATION. Observation status is judged to be reasonable and necessary in order to provide the required intensity of service to ensure the patient's safety. The patient's presenting symptoms, physical exam findings, and initial radiographic and laboratory data in the context of their medical condition is felt to place them at decreased risk for further clinical deterioration. Furthermore, it is anticipated that the patient will be medically stable for discharge from the hospital within 2 midnights of admission.    For questions or updates, please contact [facility] Please consult [email or web address] for contact info under        Signed, Merlene JAYSON Blood, MD  01/30/2024 1:26 AM   Merlene Blood, MD MS  Cardiology [facility]

## 2024-01-30 NOTE — Consult Note (Addendum)
   Electrophysiology consultation   Patient ID: Deanna [name] MRN: [id number]; DOB: 04-12-1946  Admit date: 01/29/2024 Date of Consult: 01/30/2024  PCP:  Delores Rojelio Caldron, NP   History of Present Illness:   Deanna [name] is a 78 year old woman who I am seeing today for an evaluation of atrial fibrillation at the request of Dr. Duffy.  The patient has a history of ATTR amyloidosis, ventricular tachycardia, atrial fibrillation and pauses.  On the day of admission, she woke up feeling short of breath.  No clear trigger.  EMS was called and found her to be in atrial fibrillation with a heart rate at 130 bpm.  In the past she has had symptomatic bradycardia.  She is actually scheduled for a pacemaker implant with Dr. Waddell on February 04, 2024.  Today she has many questions about her cardiac amyloidosis and heart function.  She also asked many questions about her atrial fibrillation history and plans for permanent pacemaker.   Past medical, surgical, social and family history reviewed.  ROS:  Please see the history of present illness.  All other ROS reviewed and negative.     Physical Exam/Data:   Vitals:   01/29/24 2052 01/30/24 0032 01/30/24 0333 01/30/24 0749  BP: 122/69 (!) 131/58 130/61 (!) 133/59  Pulse: 86 76 71 73  Resp: (!) 22 20 16 18   Temp: 97.6 F (36.4 C) 97.6 F (36.4 C) 97.8 F (36.6 C) 97.6 F (36.4 C)  TempSrc: Oral Oral Oral Oral  SpO2: 98% 97% 98% 96%  Weight:      Height:        General:  Well nourished, well developed, in no acute distress. In chair. Cardiac:  warm extremities  Psych:  Normal affect   EKG:  The EKG was personally reviewed and demonstrates: Sinus rhythm, left bundle branch block, first-degree AV delay  Telemetry:  Telemetry was personally reviewed and demonstrates: Sinus rhythm  December 30, 2023 stress echo EF 75% No outflow tract obstruction with exertion  January 16, 2024 ZIO monitor Heart rate 28-1 64, average 78 No A-fib or  flutter Intermittent complete heart block and nocturnal pauses lasting up to 9 seconds  Assessment and Plan:   #Tachycardia-bradycardia syndrome #Atrial fibrillation The patient presents with symptomatic atrial fibrillation with rapid ventricular rates.  She also has a history of prolonged pauses while on nodal blockade.  In an effort to better control her heart rhythms/rates and symptoms, favor moving forward with permanent pacemaker implant followed by initiation of antiarrhythmic drugs.  I discussed this treatment strategy in detail with the patient and she wishes to proceed.  Timing TBD.  While here, stop Eliquis  and use heparin  drip.  Okay to continue metoprolol  at the current dose but would avoid further up titration.    Ole T. Cindie, MD, [facility], [facility] Cardiac Electrophysiology

## 2024-01-30 NOTE — Progress Notes (Signed)
 PHARMACY - ANTICOAGULATION CONSULT NOTE  Pharmacy Consult for heparin  Indication: atrial fibrillation  Allergies  Allergen Reactions   Codeine Nausea And Vomiting and Palpitations     Nausea and/or Vomiting, chest pain and acid reflux Feels like she is having a heart attack.     Gabapentin  Other (See Comments)    Eyes went numb and blurry  Blurred Vision as well   Gluten Meal Diarrhea and Other (See Comments)    Severe diarrhea per member    Dust Mite Extract     Upper Respiratory issues    Sulfa Antibiotics Other (See Comments)   Terbinafine Hcl Rash    Patient Measurements: Height: 5' 2 (157.5 cm) Weight: 74.8 kg (165 lb) IBW/kg (Calculated) : 50.1 HEPARIN  DW (KG): 66.3  Vital Signs: Temp: 97.6 F (36.4 C) (08/31 0749) Temp Source: Oral (08/31 0749) BP: 133/59 (08/31 0749) Pulse Rate: 73 (08/31 0749)  Labs: Recent Labs    01/28/24 1634 01/29/24 1637 01/29/24 1646 01/29/24 1844 01/30/24 0333  HGB 13.8 13.9 14.3  --   --   HCT 41.8 40.1 42.0  --   --   PLT 291 263  --   --   --   CREATININE 0.61 0.69 0.60  --  0.65  TROPONINIHS  --  53*  --  91*  --     Estimated Creatinine Clearance: 56.7 mL/min (by C-G formula based on SCr of 0.65 mg/dL).   Medical History: Past Medical History:  Diagnosis Date   A-fib (HCC)    Abnormal glucose 03/06/2020   Acute coronary syndrome (HCC) 02/09/2023   Age-related osteoporosis without current pathological fracture 11/22/2015   Overview:  Femoral neck  Formatting of this note might be different from the original. Femoral neck   Allergic rhinitis due to pollen 08/19/2015   Allergy    Aortic atherosclerosis (HCC) 04/04/2021   Arthritis    Carpal tunnel syndrome, bilateral 02/28/2002   Overview:  H/O CTS   Celiac disease    Cervical disc disorder 05/10/2009   Chronic cough 02/15/2006   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 11/26/2015   Formatting of this note might be different from the  original. Added automatically from request for surgery [id number]   Chronic pain syndrome 04/01/2015   Colon polyps    Dyspnea    Essential hypertension 07/20/2000   Overview:  HBP   Family history of adverse reaction to anesthesia    mother delerium   Female cystocele 02/27/2009   GAD (generalized anxiety disorder) 08/19/2015   GERD (gastroesophageal reflux disease)    Heart murmur    History of Bell's palsy 03/21/2015   Hx of adenomatous colonic polyps 12/20/2008   Overview:  Colonoscopy 07/2003 - LMD - polyps removed - recommended repeat in 3 years Colonoscopy 09/2011 - Dr. Brenita - hyperplastic polyp(s) were removed - repeat in 5 years   Hypertension    Incomplete uterovaginal prolapse 03/04/2004   Insomnia    Left bundle-branch block 05/20/2006   Lumbar radiculopathy 01/09/2016   Formatting of this note might be different from the original. 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 Femoral neck   Other intervertebral disc degeneration, lumbar  region 01/29/2015   Pre-operative cardiovascular examination 09/03/2020   Primary osteoarthritis, right hand 02/28/2002   Overview:  OA HANDS, ESP THUMBS   Radial tunnel syndrome, right 11/04/2016   Radiculopathy, lumbar region 11/22/2014   Overview:  Added automatically from request for surgery [id number]   Rhinitis, allergic 02/24/2002   Overview:  ALLERGY SYMPTOMS - states was seen by allergist in past and informed allergic to dust   S/P left total hip arthroplasty 09/17/2020   S/P total right hip arthroplasty 02/18/2023   Sacroiliitis (HCC) 08/02/2019   Screening for colon cancer 04/17/2005    Formatting of this note might be different from the original. 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 TRIGGER THUMBS - started spring 2003. Injected 02/28/02   Unspecified asthma, uncomplicated 12/25/2004    Medications:  Medications Prior to Admission  Medication Sig Dispense Refill Last Dose/Taking   albuterol  (VENTOLIN  HFA) 108 (90 Base) MCG/ACT inhaler Inhale 2 puffs into the lungs every 6 (six) hours as needed for wheezing or shortness of breath. 8 g 2    apixaban  (ELIQUIS ) 5 MG TABS tablet Take 1 tablet (5 mg total) by mouth 2 (two) times daily. Hold medication from 9/15 to 9/21 for right hip surgery 180 tablet 1    Ascorbic Acid  (VITAMIN C ) 1000 MG tablet Take 4,000 mg by mouth daily.      budesonide -glycopyrrolate -formoterol  (BREZTRI  AEROSPHERE) 160-9-4.8 MCG/ACT AERO inhaler Inhale 2 puffs into the lungs in the morning and at bedtime. 10.7 g 11    budesonide -glycopyrrolate -formoterol  (BREZTRI  AEROSPHERE) 160-9-4.8 MCG/ACT AERO inhaler Inhale 2 puffs into the lungs in the morning and at bedtime. 11.8 g 0    Calcium -Magnesium  (CAL-MAG PO) Take 2,000 mg by mouth at bedtime. 1000 mg      Carboxymethylcellul-Glycerin  (CLEAR EYES FOR DRY EYES OP) Place 1 drop into both eyes daily.      fluorouracil (EFUDEX) 5 % cream Apply 1 Application topically 2 (two) times daily.      hydrochlorothiazide  (HYDRODIURIL ) 25 MG tablet Take 1 tablet (25 mg total) by mouth 2 (two) times daily. 180 tablet 3    hydrocortisone  2.5 % cream Apply topically as needed (hemmeroids).      imiquimod (ALDARA) 5 % cream as needed (rash).      loratadine  (CLARITIN ) 10 MG  tablet Take 10 mg by mouth daily as needed for allergies.      losartan  (COZAAR ) 50 MG tablet TAKE 1 TABLET BY MOUTH EVERY DAY 90 tablet 1    metoprolol  succinate (TOPROL -XL) 100 MG 24 hr tablet Take 1 tablet (100 mg total) by mouth daily. Take with or immediately following a meal. 90 tablet 3    milk thistle 175 MG tablet Take 175 mg by mouth daily.      nitroGLYCERIN  (NITROSTAT ) 0.4 MG SL tablet Place 1 tablet (0.4 mg total) under the tongue every 5 (five) minutes x 3 doses as needed for chest pain. 25 tablet 1    nystatin  (MYCOSTATIN ) 100000 UNIT/ML suspension Take 5 mLs (500,000 Units total) by mouth 4 (four) times daily. 210 mL 0    omeprazole  (PRILOSEC) 40 MG capsule Take 1 capsule (40 mg  total) by mouth daily. 30 capsule 1    Probiotic Product (PROBIOTIC DAILY PO) Take 1 tablet by mouth daily.      rosuvastatin  (CRESTOR ) 20 MG tablet Take 1 tablet (20 mg total) by mouth daily. 30 tablet 6    triamcinolone cream (KENALOG) 0.1 % Apply topically as needed (rash).      Vitamin A  2400 MCG (8000 UT) CAPS Take 16,000 Units by mouth daily. 8000 units      Vitamin D, Ergocalciferol, (DRISDOL) 1.25 MG (50000 UNIT) CAPS capsule Take 50,000 Units by mouth once a week.      zolpidem  (AMBIEN ) 5 MG tablet Take 5 mg by mouth at bedtime.      Scheduled:   budesonide -glycopyrrolate -formoterol   2 puff Inhalation BID   heparin   3,300 Units Intravenous Once   losartan   50 mg Oral Daily   metoprolol  succinate  100 mg Oral Daily   pantoprazole   40 mg Oral Daily   rosuvastatin   20 mg Oral Daily   zolpidem   5 mg Oral QHS   Infusions:   heparin      PRN: acetaminophen , ondansetron  (ZOFRAN ) IV  Assessment: Ms Kassim is a 89 yoF with ATTR amyloid, VT, atrial fibrillation, degree AV block and intermittent pauses admitted for afib evaluation. Pharmacy consulted to dose heparin  for afib. Last dose of eliquis  was 0830 on 8/31 therefore no bolus needed.   Goal of Therapy:  Heparin  level 0.3-0.7 units/ml aPTT  66-102 seconds Monitor platelets by anticoagulation protocol: Yes   Plan:  Start heparin  infusion at 900 units/hr at 2000 Check heparin  level and aPTT 6 hours after start Daily heparin  level and aPTT until correlating Continue to monitor H&H, platelets, and signs/symptoms of bleeding  Dionicia Canavan, PharmD, RPh PGY1 Acute Care Pharmacy Resident [facility]  01/30/2024 10:28 AM

## 2024-01-30 NOTE — Care Management Obs Status (Signed)
 MEDICARE OBSERVATION STATUS NOTIFICATION   Patient Details  Name: Deanna [name] MRN: [id number] Date of Birth: 1945-06-29   Medicare Observation Status Notification Given:  Yes    Robynn Eileen Hoose, RN 01/30/2024, 10:04 AM

## 2024-01-31 DIAGNOSIS — I480 Paroxysmal atrial fibrillation: Secondary | ICD-10-CM

## 2024-01-31 LAB — CBC
HCT: 40.6 % (ref 36.0–46.0)
Hemoglobin: 13.7 g/dL (ref 12.0–15.0)
MCH: 29.8 pg (ref 26.0–34.0)
MCHC: 33.7 g/dL (ref 30.0–36.0)
MCV: 88.3 fL (ref 80.0–100.0)
Platelets: 276 K/uL (ref 150–400)
RBC: 4.6 MIL/uL (ref 3.87–5.11)
RDW: 13.2 % (ref 11.5–15.5)
WBC: 7.7 K/uL (ref 4.0–10.5)
nRBC: 0 % (ref 0.0–0.2)

## 2024-01-31 LAB — HEPARIN LEVEL (UNFRACTIONATED)
Heparin Unfractionated: >1.1 [IU]/mL — ABNORMAL HIGH (ref 0.30–0.70)
Heparin Unfractionated: 0.99 [IU]/mL — ABNORMAL HIGH (ref 0.30–0.70)

## 2024-01-31 LAB — APTT
aPTT: 117 s — ABNORMAL HIGH (ref 24–36)
aPTT: 83 s — ABNORMAL HIGH (ref 24–36)

## 2024-01-31 NOTE — Progress Notes (Signed)
 PHARMACY - ANTICOAGULATION CONSULT NOTE  Pharmacy Consult for heparin  Indication: atrial fibrillation  Allergies  Allergen Reactions   Codeine Nausea And Vomiting and Palpitations     Nausea and/or Vomiting, chest pain and acid reflux Feels like she is having a heart attack.     Gabapentin  Other (See Comments)    Eyes went numb and blurry  Blurred Vision as well   Gluten Meal Diarrhea and Other (See Comments)    Severe diarrhea per member    Dust Mite Extract     Upper Respiratory issues    Sulfa Antibiotics Other (See Comments)   Terbinafine Hcl Rash    Patient Measurements: Height: 5' 2 (157.5 cm) Weight: 74.8 kg (165 lb) IBW/kg (Calculated) : 50.1 HEPARIN  DW (KG): 66.3  Vital Signs: Temp: 97.7 F (36.5 C) (09/01 1326) Temp Source: Oral (09/01 1326) BP: 136/81 (09/01 1326) Pulse Rate: 83 (09/01 1326)  Labs: Recent Labs    01/29/24 1637 01/29/24 1646 01/29/24 1844 01/30/24 0333 01/30/24 1011 01/31/24 0513 01/31/24 1527  HGB 13.9 14.3  --   --  13.0 13.7  --   HCT 40.1 42.0  --   --  39.1 40.6  --   PLT 263  --   --   --  272 276  --   APTT  --   --   --   --  34 117* 83*  HEPARINUNFRC  --   --   --   --  >1.10* >1.10* 0.99*  CREATININE 0.69 0.60  --  0.65  --   --   --   TROPONINIHS 53*  --  91*  --   --   --   --     Estimated Creatinine Clearance: 56.7 mL/min (by C-G formula based on SCr of 0.65 mg/dL).   Medical History: Past Medical History:  Diagnosis Date   A-fib (HCC)    Abnormal glucose 03/06/2020   Acute coronary syndrome (HCC) 02/09/2023   Age-related osteoporosis without current pathological fracture 11/22/2015   Overview:  Femoral neck  Formatting of this note might be different from the original. Femoral neck   Allergic rhinitis due to pollen 08/19/2015   Allergy    Aortic atherosclerosis (HCC) 04/04/2021   Arthritis    Carpal tunnel syndrome, bilateral 02/28/2002   Overview:  H/O CTS   Celiac disease    Cervical disc disorder  05/10/2009   Chronic cough 02/15/2006   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 11/26/2015   Formatting of this note might be different from the original. Added automatically from request for surgery [id number]   Chronic pain syndrome 04/01/2015   Colon polyps    Dyspnea    Essential hypertension 07/20/2000   Overview:  HBP   Family history of adverse reaction to anesthesia    mother delerium   Female cystocele 02/27/2009   GAD (generalized anxiety disorder) 08/19/2015   GERD (gastroesophageal reflux disease)    Heart murmur    History of Bell's palsy 03/21/2015   Hx of adenomatous colonic polyps 12/20/2008   Overview:  Colonoscopy 07/2003 - LMD - polyps removed - recommended repeat in 3 years Colonoscopy 09/2011 - Dr. Brenita - hyperplastic polyp(s) were removed - repeat in 5 years   Hypertension    Incomplete uterovaginal prolapse 03/04/2004   Insomnia    Left bundle-branch block 05/20/2006   Lumbar radiculopathy 01/09/2016   Formatting of this note might be different from the original. 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 Femoral neck   Other intervertebral disc degeneration, lumbar region 01/29/2015   Pre-operative cardiovascular examination 09/03/2020   Primary osteoarthritis, right hand 02/28/2002   Overview:  OA HANDS, ESP THUMBS   Radial tunnel syndrome, right 11/04/2016   Radiculopathy, lumbar region 11/22/2014   Overview:  Added automatically from request for surgery [id number]   Rhinitis, allergic 02/24/2002   Overview:  ALLERGY SYMPTOMS - states was seen by allergist in past and informed allergic to dust   S/P  left total hip arthroplasty 09/17/2020   S/P total right hip arthroplasty 02/18/2023   Sacroiliitis (HCC) 08/02/2019   Screening for colon cancer 04/17/2005   Formatting of this note might be different from the original. 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 TRIGGER THUMBS - started spring 2003. Injected 02/28/02   Unspecified asthma, uncomplicated 12/25/2004    Medications:  Medications Prior to Admission  Medication Sig Dispense Refill Last Dose/Taking   albuterol  (VENTOLIN  HFA) 108 (90 Base) MCG/ACT inhaler Inhale 2 puffs into the lungs every 6 (six) hours as needed for wheezing or shortness of breath. 8 g 2 Unknown   apixaban  (ELIQUIS ) 5 MG TABS tablet Take 1 tablet (5 mg total) by mouth 2 (two) times daily. Hold medication from 9/15 to 9/21 for right hip surgery 180 tablet 1 01/29/2024   Ascorbic Acid  (VITAMIN C ) 1000 MG tablet Take 4,000 mg by mouth daily.   01/29/2024   budesonide -glycopyrrolate -formoterol  (BREZTRI  AEROSPHERE) 160-9-4.8 MCG/ACT AERO inhaler Inhale 2 puffs into the lungs in the morning and at bedtime. 11.8 g 0 Past Week   Calcium -Magnesium  (CAL-MAG PO) Take 2,000 mg by mouth at bedtime. 1000 mg   01/29/2024   Carboxymethylcellul-Glycerin  (CLEAR EYES FOR DRY EYES OP) Place 1 drop into both eyes daily.   Unknown   fluorouracil (EFUDEX) 5 % cream Apply 1 Application topically 2 (two) times daily.   Past Month   hydrochlorothiazide  (HYDRODIURIL ) 25 MG tablet Take 1 tablet (25 mg total) by mouth 2 (two) times daily. 180 tablet 3 01/29/2024   hydrocortisone  2.5 % cream Apply topically as needed (hemmeroids).   Unknown    imiquimod (ALDARA) 5 % cream as needed (rash).   Unknown   loratadine  (CLARITIN ) 10 MG tablet Take 10 mg by mouth daily as needed for allergies.   Unknown   losartan  (COZAAR ) 50 MG tablet TAKE 1 TABLET BY MOUTH EVERY DAY 90 tablet 1 01/29/2024   metoprolol  succinate (TOPROL -XL) 50 MG 24 hr tablet Take 100 mg by mouth daily.   01/29/2024   nitroGLYCERIN  (NITROSTAT ) 0.4 MG SL tablet Place 1 tablet (0.4 mg total) under the tongue every 5 (five) minutes x 3 doses as needed for chest pain. 25 tablet 1 Unknown   nystatin  (MYCOSTATIN ) 100000 UNIT/ML suspension Take 5 mLs (500,000 Units total) by mouth 4 (four) times daily. 210 mL  0 Unknown   omeprazole  (PRILOSEC) 40 MG capsule Take 1 capsule (40 mg total) by mouth daily. 30 capsule 1 01/28/2024   Probiotic Product (PROBIOTIC DAILY PO) Take 1 tablet by mouth daily.   Past Week   rosuvastatin  (CRESTOR ) 20 MG tablet Take 1 tablet (20 mg total) by mouth daily. 30 tablet 6 01/29/2024   triamcinolone cream (KENALOG) 0.1 % Apply topically as needed (rash).   Unknown   Vitamin A  2400 MCG (8000 UT) CAPS Take 16,000 Units by mouth daily. 8000 units   01/29/2024   Vitamin D, Ergocalciferol, (DRISDOL) 1.25 MG (50000 UNIT) CAPS capsule Take 50,000 Units by mouth once a week. Patient takes on fridays   01/28/2024   zolpidem  (AMBIEN ) 5 MG tablet Take 5 mg by mouth at bedtime.   Taking   metoprolol  succinate (TOPROL -XL) 100 MG 24 hr tablet Take 1 tablet (100 mg total) by mouth daily. Take with or immediately following a meal. (Patient not taking: Reported on 01/31/2024) 90 tablet 3 Not Taking   milk thistle 175 MG tablet Take 175 mg by mouth daily.      Scheduled:   budesonide -glycopyrrolate -formoterol   2 puff Inhalation BID   losartan   50 mg Oral Daily   metoprolol  succinate  100 mg Oral Daily   pantoprazole   40 mg Oral Daily   rosuvastatin   20 mg Oral Daily   zolpidem   5 mg Oral QHS   Infusions:   heparin  800 Units/hr (01/31/24 1504)   PRN: acetaminophen , ondansetron   (ZOFRAN ) IV  Assessment: Ms Fessel is a 37 yoF with ATTR amyloid, VT, atrial fibrillation, degree AV block and intermittent pauses admitted for afib evaluation. Pharmacy consulted to dose heparin  for afib. Last dose of eliquis  was 0830 on 8/31 therefore no bolus needed.   APTT this evening is within goal range at 83.  No overt bleeding or complications noted.    Goal of Therapy:  Heparin  level 0.3-0.7 units/ml aPTT 66-102 seconds Monitor platelets by anticoagulation protocol: Yes   Plan:  Continue IV heparin  at current rate of 800 units/hr.  Daily heparin  level and aPTT until correlating Continue to monitor H&H, platelets, and signs/symptoms of bleeding  Harlene Barlow, [employer], BCCP Clinical Pharmacist  01/31/2024 5:00 PM   [facility] pharmacy phone numbers are listed on [email or web address]

## 2024-01-31 NOTE — Progress Notes (Signed)
 PHARMACY - ANTICOAGULATION CONSULT NOTE  Pharmacy Consult for heparin  Indication: atrial fibrillation  Labs: Recent Labs    01/29/24 1637 01/29/24 1646 01/29/24 1844 01/30/24 0333 01/30/24 1011 01/31/24 0513  HGB 13.9 14.3  --   --  13.0 13.7  HCT 40.1 42.0  --   --  39.1 40.6  PLT 263  --   --   --  272 276  APTT  --   --   --   --  34 117*  HEPARINUNFRC  --   --   --   --  >1.10* >1.10*  CREATININE 0.69 0.60  --  0.65  --   --   TROPONINIHS 53*  --  91*  --   --   --    Assessment: 77yo female supratherapeutic on heparin  with initial dosing while DOAC on hold; no infusion issues or signs of bleeding per RN.  Goal of Therapy:  aPTT 66-102 seconds   Plan:  Decrease heparin  infusion by 1-2 units/kg/hr to 800 units/hr. Check PTT in 8 hours.   Marvetta Dauphin, PharmD, BCPS 01/31/2024 6:17 AM

## 2024-01-31 NOTE — Progress Notes (Signed)
  Progress Note  Patient Name: Deanna [name] Date of Encounter: 01/31/2024 [facility] Cardiologist: None   Interval Summary   No acute overnight events. Patient reports feeling relatively well. No new or acute complaints.   Vital Signs Vitals:   01/30/24 1939 01/30/24 1954 01/31/24 0018 01/31/24 0515  BP: 138/61  125/69 (!) 153/73  Pulse: 78 82 85 90  Resp: 20 (!) 23 20 19   Temp: 98.1 F (36.7 C) (!) 97.4 F (36.3 C) 97.6 F (36.4 C) 98.3 F (36.8 C)  TempSrc: Oral Oral Oral Oral  SpO2: 97% 97% 92% 96%  Weight:      Height:        Intake/Output Summary (Last 24 hours) at 01/31/2024 9297 Last data filed at 01/31/2024 0500 Gross per 24 hour  Intake --  Output 500 ml  Net -500 ml      01/29/2024    4:22 PM 01/13/2024   11:45 AM 01/11/2024   10:41 AM  Last 3 Weights  Weight (lbs) 165 lb 166 lb 166 lb 9.6 oz  Weight (kg) 74.844 kg 75.297 kg 75.569 kg      Telemetry/ECG  SR - Personally Reviewed  Physical Exam  General: Well developed, in no acute distress.  Neck: No JVD.  Cardiac: Normal rate, regular rhythm.  Resp: Normal work of breathing.  Ext: No edema.  Neuro: No gross focal deficits.  Psych: Normal affect.   PYP Scan 01/19/24:   Findings are suggestive of cardiac ATTR amyloidosis. The myocardium was positive for radiotracer uptake.   The visual grade of myocardial uptake relative to the ribs was Grade 3 (Myocardial uptake greater than rib uptake with mild/absent rib uptake).   CT images were obtained for attenuation correction and were examined for the presence of coronary calcium  when appropriate.   Coronary calcium  was present on the attenuation correction CT images. Moderate coronary calcifications were present. Coronary calcifications were present in the right coronary artery distribution(s). Aortic atherosclerosis noted.  Aortic Valve calcification noted.   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 The myocardium was positive for  radiotracer uptake.  IFE urine and serum testing recommend to rule out AL-CA.  Cardiac MRI 09/29/23:  IMPRESSION: 1. Normal left ventricular size with severe asymmetric hypertrophy of the basal septum and basal inferior wall. Diffuse mild hypokinesis, LV EF 46%. No mitral valve systolic anterior motion noted.   2.  Normal RV size and systolic function, RV EF 50%.   3. Noncoronary LGE pattern with confluent basal to mid anterior/anterolateral/inferolateral/inferior subendocardial LGE and mid-wall LGE throughout the anteroseptum and inferoseptum.   4.  Elevated extracellular volume percentage at 37%.   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 Will pursue permanent pacemaker implant. Timing to be determined based on lab availability. Will make NPO after midnight in hopes of being able to add on to schedule tomorrow, no guarantee. - Holding DOAC, continue heparin  drip until timing of pacer. Will pause at 0600 tomorrow.  - Continue metoprolol  at the current dose but avoiding further up titration until after PPM implant.  For questions or updates, please contact [facility] Please consult [email or web address] for contact info under       Signed, Fonda Kitty, MD

## 2024-01-31 NOTE — Progress Notes (Incomplete)
 PHARMACY - ANTICOAGULATION CONSULT NOTE  Pharmacy Consult for heparin  Indication: atrial fibrillation  Allergies  Allergen Reactions   Codeine Nausea And Vomiting and Palpitations     Nausea and/or Vomiting, chest pain and acid reflux Feels like she is having a heart attack.     Gabapentin  Other (See Comments)    Eyes went numb and blurry  Blurred Vision as well   Gluten Meal Diarrhea and Other (See Comments)    Severe diarrhea per member    Dust Mite Extract     Upper Respiratory issues    Sulfa Antibiotics Other (See Comments)   Terbinafine Hcl Rash    Patient Measurements: Height: 5' 2 (157.5 cm) Weight: 74.8 kg (165 lb) IBW/kg (Calculated) : 50.1 HEPARIN  DW (KG): 66.3  Vital Signs: Temp: 98.3 F (36.8 C) (09/01 0515) Temp Source: Oral (09/01 0515) BP: 153/73 (09/01 0515) Pulse Rate: 90 (09/01 0515)  Labs: Recent Labs    01/29/24 1637 01/29/24 1646 01/29/24 1844 01/30/24 0333 01/30/24 1011 01/31/24 0513  HGB 13.9 14.3  --   --  13.0 13.7  HCT 40.1 42.0  --   --  39.1 40.6  PLT 263  --   --   --  272 276  APTT  --   --   --   --  34 117*  HEPARINUNFRC  --   --   --   --  >1.10* >1.10*  CREATININE 0.69 0.60  --  0.65  --   --   TROPONINIHS 53*  --  91*  --   --   --     Estimated Creatinine Clearance: 56.7 mL/min (by C-G formula based on SCr of 0.65 mg/dL).   Medical History: Past Medical History:  Diagnosis Date   A-fib (HCC)    Abnormal glucose 03/06/2020   Acute coronary syndrome (HCC) 02/09/2023   Age-related osteoporosis without current pathological fracture 11/22/2015   Overview:  Femoral neck  Formatting of this note might be different from the original. Femoral neck   Allergic rhinitis due to pollen 08/19/2015   Allergy    Aortic atherosclerosis (HCC) 04/04/2021   Arthritis    Carpal tunnel syndrome, bilateral 02/28/2002   Overview:  H/O CTS   Celiac disease    Cervical disc disorder 05/10/2009   Chronic cough 02/15/2006   Chronic  left-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 11/26/2015   Formatting of this note might be different from the original. Added automatically from request for surgery [id number]   Chronic pain syndrome 04/01/2015   Colon polyps    Dyspnea    Essential hypertension 07/20/2000   Overview:  HBP   Family history of adverse reaction to anesthesia    mother delerium   Female cystocele 02/27/2009   GAD (generalized anxiety disorder) 08/19/2015   GERD (gastroesophageal reflux disease)    Heart murmur    History of Bell's palsy 03/21/2015   Hx of adenomatous colonic polyps 12/20/2008   Overview:  Colonoscopy 07/2003 - LMD - polyps removed - recommended repeat in 3 years Colonoscopy 09/2011 - Dr. Brenita - hyperplastic polyp(s) were removed - repeat in 5 years   Hypertension    Incomplete uterovaginal prolapse 03/04/2004   Insomnia    Left bundle-branch block 05/20/2006   Lumbar radiculopathy 01/09/2016   Formatting of this note might be different from the original. 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 Femoral neck   Other intervertebral disc degeneration, lumbar region 01/29/2015   Pre-operative cardiovascular examination 09/03/2020   Primary osteoarthritis, right hand 02/28/2002   Overview:  OA HANDS, ESP THUMBS   Radial tunnel syndrome, right 11/04/2016   Radiculopathy, lumbar region 11/22/2014   Overview:  Added automatically from request for surgery [id number]   Rhinitis, allergic 02/24/2002   Overview:  ALLERGY SYMPTOMS - states was seen by allergist in past and informed allergic to dust   S/P left total hip arthroplasty 09/17/2020   S/P  total right hip arthroplasty 02/18/2023   Sacroiliitis (HCC) 08/02/2019   Screening for colon cancer 04/17/2005   Formatting of this note might be different from the original. 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 TRIGGER THUMBS - started spring 2003. Injected 02/28/02   Unspecified asthma, uncomplicated 12/25/2004    Medications:  Medications Prior to Admission  Medication Sig Dispense Refill Last Dose/Taking   albuterol  (VENTOLIN  HFA) 108 (90 Base) MCG/ACT inhaler Inhale 2 puffs into the lungs every 6 (six) hours as needed for wheezing or shortness of breath. 8 g 2    apixaban  (ELIQUIS ) 5 MG TABS tablet Take 1 tablet (5 mg total) by mouth 2 (two) times daily. Hold medication from 9/15 to 9/21 for right hip surgery 180 tablet 1    Ascorbic Acid  (VITAMIN C ) 1000 MG tablet Take 4,000 mg by mouth daily.      budesonide -glycopyrrolate -formoterol  (BREZTRI  AEROSPHERE) 160-9-4.8 MCG/ACT AERO inhaler Inhale 2 puffs into the lungs in the morning and at bedtime. 10.7 g 11    budesonide -glycopyrrolate -formoterol  (BREZTRI  AEROSPHERE) 160-9-4.8 MCG/ACT AERO inhaler Inhale 2 puffs into the lungs in the morning and at bedtime. 11.8 g 0    Calcium -Magnesium  (CAL-MAG PO) Take 2,000 mg by mouth at bedtime. 1000 mg      Carboxymethylcellul-Glycerin  (CLEAR EYES FOR DRY EYES OP) Place 1 drop into both eyes daily.      fluorouracil (EFUDEX) 5 % cream Apply 1 Application topically 2 (two) times daily.      hydrochlorothiazide  (HYDRODIURIL ) 25 MG tablet Take 1 tablet (25 mg total) by mouth 2 (two) times daily. 180 tablet 3    hydrocortisone  2.5 % cream Apply  topically as needed (hemmeroids).      imiquimod (ALDARA) 5 % cream as needed (rash).      loratadine  (CLARITIN ) 10 MG tablet Take 10 mg by mouth daily as needed for allergies.      losartan  (COZAAR ) 50 MG tablet TAKE 1 TABLET BY MOUTH EVERY DAY 90 tablet 1    metoprolol  succinate (TOPROL -XL) 100 MG 24 hr tablet Take 1 tablet (100 mg total) by mouth daily. Take with or immediately following a meal. 90 tablet 3    milk thistle 175 MG tablet Take 175 mg by mouth daily.      nitroGLYCERIN  (NITROSTAT ) 0.4 MG SL tablet Place 1 tablet (0.4 mg total) under the tongue every 5 (five) minutes x 3  doses as needed for chest pain. 25 tablet 1    nystatin  (MYCOSTATIN ) 100000 UNIT/ML suspension Take 5 mLs (500,000 Units total) by mouth 4 (four) times daily. 210 mL 0    omeprazole  (PRILOSEC) 40 MG capsule Take 1 capsule (40 mg total) by mouth daily. 30 capsule 1    Probiotic Product (PROBIOTIC DAILY PO) Take 1 tablet by mouth daily.      rosuvastatin  (CRESTOR ) 20 MG tablet Take 1 tablet (20 mg total) by mouth daily. 30 tablet 6    triamcinolone cream (KENALOG) 0.1 % Apply topically as needed (rash).      Vitamin A  2400 MCG (8000 UT) CAPS Take 16,000 Units by mouth daily. 8000 units      Vitamin D, Ergocalciferol, (DRISDOL) 1.25 MG (50000 UNIT) CAPS capsule Take 50,000 Units by mouth once a week.      zolpidem  (AMBIEN ) 5 MG tablet Take 5 mg by mouth at bedtime.      Scheduled:   budesonide -glycopyrrolate -formoterol   2 puff Inhalation BID   losartan   50 mg Oral Daily   metoprolol  succinate  100 mg Oral Daily   pantoprazole   40 mg Oral Daily   rosuvastatin   20 mg Oral Daily   zolpidem   5 mg Oral QHS   Infusions:   heparin  800 Units/hr (01/31/24 0626)   PRN: acetaminophen , ondansetron  (ZOFRAN ) IV  Assessment: Deanna [name] is a 13 yoF with ATTR amyloid, VT, atrial fibrillation, degree AV block and intermittent pauses admitted for afib evaluation. Pharmacy consulted to dose heparin  for afib. Last dose of  eliquis  was 0830 on 8/31 therefore no bolus needed.   Goal of Therapy:  Heparin  level 0.3-0.7 units/ml aPTT 66-102 seconds Monitor platelets by anticoagulation protocol: Yes   Plan:  Cont infusion at x00 units/hr  Check heparin  level and aPTT 6 hours after start Daily heparin  level and aPTT until correlating Continue to monitor H&H, platelets, and signs/symptoms of bleeding  Dionicia Canavan, PharmD, RPh PGY1 Acute Care Pharmacy Resident [facility]  01/31/2024 7:25 AM

## 2024-02-01 DIAGNOSIS — R001 Bradycardia, unspecified: Secondary | ICD-10-CM

## 2024-02-01 LAB — URINALYSIS, ROUTINE W REFLEX MICROSCOPIC
Bilirubin Urine: NEGATIVE
Glucose, UA: NEGATIVE mg/dL
Ketones, ur: NEGATIVE mg/dL
Nitrite: NEGATIVE
Protein, ur: NEGATIVE mg/dL
Specific Gravity, Urine: 1.003 — ABNORMAL LOW (ref 1.005–1.030)
WBC, UA: >50 WBC/hpf (ref 0–5)
pH: 7 (ref 5.0–8.0)

## 2024-02-01 LAB — CBC
HCT: 36 % (ref 36.0–46.0)
Hemoglobin: 12.2 g/dL (ref 12.0–15.0)
MCH: 30 pg (ref 26.0–34.0)
MCHC: 33.9 g/dL (ref 30.0–36.0)
MCV: 88.7 fL (ref 80.0–100.0)
Platelets: 240 K/uL (ref 150–400)
RBC: 4.06 MIL/uL (ref 3.87–5.11)
RDW: 13 % (ref 11.5–15.5)
WBC: 8.2 K/uL (ref 4.0–10.5)
nRBC: 0 % (ref 0.0–0.2)

## 2024-02-01 LAB — HEPARIN LEVEL (UNFRACTIONATED)
Heparin Unfractionated: 0.81 [IU]/mL — ABNORMAL HIGH (ref 0.30–0.70)
Heparin Unfractionated: 0.32 [IU]/mL (ref 0.30–0.70)

## 2024-02-01 LAB — SURGICAL PCR SCREEN
MRSA, PCR: NEGATIVE
Staphylococcus aureus: NEGATIVE

## 2024-02-01 LAB — UPEP/UIFE/LIGHT CHAINS/TP, 24-HR UR: Kappa/Lambda Ratio,U: >3.23 (ref 1.83–14.26)

## 2024-02-01 LAB — APTT
aPTT: 122 s — ABNORMAL HIGH (ref 24–36)
aPTT: 54 s — ABNORMAL HIGH (ref 24–36)

## 2024-02-01 MED ORDER — CHLORHEXIDINE GLUCONATE 4 % EX SOLN
60.0000 mL | Freq: Once | CUTANEOUS | Status: AC
  Administered 2024-02-01: 4 via TOPICAL
  Filled 2024-02-01 (×2): qty 60

## 2024-02-01 MED ORDER — HEPARIN (PORCINE) 25000 UT/250ML-% IV SOLN
700.0000 [IU]/h | INTRAVENOUS | Status: AC
  Administered 2024-02-01: 700 [IU]/h via INTRAVENOUS
  Filled 2024-02-01: qty 250

## 2024-02-01 MED ORDER — YOU HAVE A PACEMAKER BOOK
Freq: Once | Status: AC
  Administered 2024-02-01: 1
  Filled 2024-02-01: qty 1

## 2024-02-01 NOTE — Plan of Care (Signed)

## 2024-02-01 NOTE — Progress Notes (Signed)
 Pharmacy note: Re: heparin    Notified by Daphne Barrack, EP NP that EP schedule is full today and procedure has been changed to tomorrow.  Heparin  drip was held at 0600 and to resume,  then stop 9/3 at 0600 for pacer.  Heparin  rate was just decreased from 800 to 700 units/hr at 0544.  Plan:  Resume heparin  drip at 700 units/hr. aPTT ~8 hrs after drip resumes. Heparin  drip to be held at 0600 on 9/3 for pacer.  Genaro Zebedee Calin, [place] 02/01/2024 9:13 AM

## 2024-02-01 NOTE — Progress Notes (Signed)
 PHARMACY - ANTICOAGULATION CONSULT NOTE  Pharmacy Consult for heparin  Indication: atrial fibrillation  Allergies  Allergen Reactions   Codeine Nausea And Vomiting and Palpitations     Nausea and/or Vomiting, chest pain and acid reflux Feels like she is having a heart attack.     Gabapentin  Other (See Comments)    Eyes went numb and blurry  Blurred Vision as well   Gluten Meal Diarrhea and Other (See Comments)    Severe diarrhea per member    Dust Mite Extract     Upper Respiratory issues    Sulfa Antibiotics Other (See Comments)   Terbinafine Hcl Rash    Patient Measurements: Height: 5' 2 (157.5 cm) Weight: 74.8 kg (165 lb) IBW/kg (Calculated) : 50.1 HEPARIN  DW (KG): 66.3  Vital Signs: Temp: 98.3 F (36.8 C) (09/01 2038) Temp Source: Oral (09/01 2038) BP: 137/70 (09/01 2038) Pulse Rate: 89 (09/01 2038)  Labs: Recent Labs    01/29/24 1637 01/29/24 1637 01/29/24 1646 01/29/24 1844 01/30/24 0333 01/30/24 1011 01/31/24 0513 01/31/24 1527 02/01/24 0410  HGB 13.9  --  14.3  --   --  13.0 13.7  --  12.2  HCT 40.1  --  42.0  --   --  39.1 40.6  --  36.0  PLT 263  --   --   --   --  272 276  --  240  APTT  --    < >  --   --   --  34 117* 83* 122*  HEPARINUNFRC  --    < >  --   --   --  >1.10* >1.10* 0.99* 0.81*  CREATININE 0.69  --  0.60  --  0.65  --   --   --   --   TROPONINIHS 53*  --   --  91*  --   --   --   --   --    < > = values in this interval not displayed.    Estimated Creatinine Clearance: 56.7 mL/min (by C-G formula based on SCr of 0.65 mg/dL).   Assessment: Deanna [name] is a 38 yoF with ATTR amyloid, VT, atrial fibrillation, degree AV block and intermittent pauses admitted for afib evaluation. Pharmacy consulted to dose heparin  for afib. Last dose of eliquis  was 0830 on 8/31 therefore no bolus needed.   APTT 122 seconds, heparin  level 0.81 on 800 units/hr.  No overt bleeding or complications per RN with level drawn appropriately. CBC  stable  Goal of Therapy:  Heparin  level 0.3-0.7 units/ml aPTT 66-102 seconds Monitor platelets by anticoagulation protocol: Yes   Plan:  Decrease IV heparin  to 700 units/hr Daily heparin  level and aPTT until correlating Continue to monitor H&H, platelets, and signs/symptoms of bleeding Heparin  off at 0600   Lynwood Poplar, PharmD, BCPS Clinical Pharmacist 02/01/2024 5:31 AM

## 2024-02-01 NOTE — Progress Notes (Signed)
 Pharmacy note: Re: heparin    Notified by Daphne Barrack, EP NP that EP schedule is full today and procedure has been changed to 9/3.  Heparin  drip was held at 0600 9/2 - will resume,  then stop 9/3 at 0600 for pacer.  Heparin  drip rate restarted 700 units/hr With heparin  level at goal 0.32 and aptt slightly low 54sec - will not increase tonight as heparin  to be turned off in am for PPM.  Plan:  Continue heparin  drip at 700 units/hr. Heparin  drip to be held at 0600 on 9/3 for pacer.    Olam Chalk Pharm.D. CPP, BCPS Clinical Pharmacist [phone] 02/01/2024 8:33 PM

## 2024-02-01 NOTE — Progress Notes (Cosign Needed)
  Patient Name: Arvetta Araque Descoteaux Date of Encounter: 02/01/2024  Primary Cardiologist: None Electrophysiologist: None  Interval Summary   The patient is doing well today.  Asks questions about the procedure and her device.   At this time, the patient denies chest pain, shortness of breath, or any new concerns.  Vital Signs    Vitals:   01/31/24 1734 01/31/24 2003 01/31/24 2038 02/01/24 0601  BP: 137/75  137/70 128/71  Pulse: 83 88 89 100  Resp: 20 13 19 16   Temp: 98 F (36.7 C)  98.3 F (36.8 C) 98.1 F (36.7 C)  TempSrc: Oral  Oral Oral  SpO2: 96% 95% 95% 95%  Weight:      Height:        Intake/Output Summary (Last 24 hours) at 02/01/2024 0756 Last data filed at 02/01/2024 0544 Gross per 24 hour  Intake 925.05 ml  Output 100 ml  Net 825.05 ml   Filed Weights   01/29/24 1622  Weight: 74.8 kg    Physical Exam    GEN- NAD, Alert and oriented  Lungs- Clear to ausculation bilaterally, normal work of breathing Cardiac- Regular rate and rhythm, no murmurs, rubs or gallops GI- soft, NT, ND, + BS Extremities- no clubbing or cyanosis. No edema  Telemetry    SR 90's (personally reviewed)  Hospital Course    Cathleen Yagi Justiss is a 78 y.o. female with ATTR amyloid, VT, AF with intermittent pauses and bradycardia admitted 01/29/24 with shortness of breath in the setting of AF.    Assessment & Plan    Tachy-Bradycardia Syndrome Atrial Fibrillation  ATTR Cardiac Amyloidosis   Symptomatic post conversion pauses and bradycardia. LVEF 46% on cMRI 08/2023. LBBB (QRS 146 ms).  -resume heparin  infusion for today, stop at 0600 tomorrow for PPM with Dr. Waddell on 02/02/24 (9/5 procedure canceled) -ok for clear liquid breakfast in am -NPO at 0930 in am  -continue Toprol  100 mg daily, avoid further up titration until PPM in place -tele monitoring      For questions or updates, please contact [facility] Please consult [email or web address] for contact info under      Signed, Daphne Barrack, NP-C, AGACNP-BC [facility] - Electrophysiology  02/01/2024, 8:15 AM

## 2024-02-02 ENCOUNTER — Encounter (HOSPITAL_COMMUNITY)
Admission: EM | Disposition: A | Discharge status: Home / Self Care | Payer: Self-pay | Source: Home / Self Care | Attending: Student in an Organized Health Care Education/Training Program

## 2024-02-02 ENCOUNTER — Other Ambulatory Visit: Payer: Self-pay

## 2024-02-02 DIAGNOSIS — I442 Atrioventricular block, complete: Secondary | ICD-10-CM

## 2024-02-02 HISTORY — PX: BIV PACEMAKER INSERTION CRT-P: EP1199

## 2024-02-02 LAB — UPEP/UIFE/LIGHT CHAINS/TP, 24-HR UR
% BETA, Urine: 0
ALBUMIN, U: 100
ALPHA 1 URINE: 0
ALPHA-2-GLOBULIN, U: 0
Free Lambda Lt Chains,Ur: <2.23 mg/L (ref 1.17–86.46)
GAMMA GLOBULIN URINE: 0
Kappa/Lambda Ratio,U: <3.23 mg/L (ref 1.83–15.21)
NOTE:: 2.23 mg/L (ref 1.17–86.46)
Protein, 24H Urine: 69 mg/(24.h) (ref 30–150)
Protein, Ur: 4.6 mg/dL

## 2024-02-02 LAB — MULTIPLE MYELOMA PANEL, SERUM
Albumin SerPl Elph-Mcnc: 3.7 g/dL (ref 2.9–4.4)
Albumin SerPl Elph-Mcnc: 3.4 g/dL (ref 2.9–4.4)
Albumin/Glob SerPl: 1.3 (ref 0.7–1.7)
Albumin/Glob SerPl: 1.2 (ref 0.7–1.7)
Alpha 1: 0.2 g/dL (ref 0.0–0.4)
Alpha 1: 0.2 g/dL (ref 0.0–0.4)
Alpha2 Glob SerPl Elph-Mcnc: 1 g/dL (ref 0.4–1.0)
Alpha2 Glob SerPl Elph-Mcnc: 0.9 g/dL (ref 0.4–1.0)
B-Globulin SerPl Elph-Mcnc: 0.8 g/dL (ref 0.7–1.3)
B-Globulin SerPl Elph-Mcnc: 0.8 g/dL (ref 0.7–1.3)
Gamma Glob SerPl Elph-Mcnc: 1 g/dL (ref 0.4–1.8)
Gamma Glob SerPl Elph-Mcnc: 1 g/dL (ref 0.4–1.8)
Globulin, Total: 3 g/dL (ref 2.2–3.9)
Globulin, Total: 2.9 g/dL (ref 2.2–3.9)
IgA/Immunoglobulin A, Serum: 121 mg/dL (ref 64–422)
IgA: 112 mg/dL (ref 64–422)
IgG (Immunoglobin G), Serum: 1092 mg/dL (ref 586–1602)
IgG (Immunoglobin G), Serum: 994 mg/dL (ref 586–1602)
IgM (Immunoglobulin M), Srm: 28 mg/dL (ref 26–217)
IgM (Immunoglobulin M), Srm: 27 mg/dL (ref 26–217)
M Protein SerPl Elph-Mcnc: 0.5 g/dL — ABNORMAL HIGH
M Protein SerPl Elph-Mcnc: 0.4 g/dL — ABNORMAL HIGH
Total Protein ELP: 6.3 g/dL (ref 6.0–8.5)
Total Protein: 6.7 g/dL (ref 6.0–8.5)

## 2024-02-02 LAB — CBC
HCT: 35.4 % — ABNORMAL LOW (ref 36.0–46.0)
Hemoglobin: 11.9 g/dL — ABNORMAL LOW (ref 12.0–15.0)
MCH: 29.8 pg (ref 26.0–34.0)
MCHC: 33.6 g/dL (ref 30.0–36.0)
MCV: 88.5 fL (ref 80.0–100.0)
Platelets: 238 K/uL (ref 150–400)
RBC: 4 MIL/uL (ref 3.87–5.11)
RDW: 13.1 % (ref 11.5–15.5)
WBC: 6.1 K/uL (ref 4.0–10.5)
nRBC: 0 % (ref 0.0–0.2)

## 2024-02-02 LAB — APTT: aPTT: 76 s — ABNORMAL HIGH (ref 24–36)

## 2024-02-02 LAB — HEPARIN LEVEL (UNFRACTIONATED): Heparin Unfractionated: 0.42 [IU]/mL (ref 0.30–0.70)

## 2024-02-02 SURGERY — BIV PACEMAKER INSERTION CRT-P

## 2024-02-02 MED ORDER — MIDAZOLAM HCL 5 MG/5ML IJ SOLN
INTRAMUSCULAR | Status: AC
  Filled 2024-02-02: qty 5

## 2024-02-02 MED ORDER — FENTANYL CITRATE (PF) 100 MCG/2ML IJ SOLN
INTRAMUSCULAR | Status: DC | PRN
Start: 2024-02-02 — End: 2024-02-02
  Administered 2024-02-02 (×2): 25 ug via INTRAVENOUS
  Administered 2024-02-02: 12.5 ug via INTRAVENOUS

## 2024-02-02 MED ORDER — SODIUM CHLORIDE 0.9 % IV SOLN: 80.0000 mg | INTRAVENOUS | Status: AC

## 2024-02-02 MED ORDER — HEPARIN (PORCINE) IN NACL 1000-0.9 UT/500ML-% IV SOLN
INTRAVENOUS | Status: DC | PRN
  Administered 2024-02-02: 500 mL

## 2024-02-02 MED ORDER — OXYCODONE HCL 5 MG PO TABS
5.0000 mg | ORAL_TABLET | Freq: Once | ORAL | Status: AC
  Administered 2024-02-02: 5 mg via ORAL
  Filled 2024-02-02: qty 1

## 2024-02-02 MED ORDER — LIDOCAINE HCL 1 % IJ SOLN
INTRAMUSCULAR | Status: AC
  Filled 2024-02-02: qty 60

## 2024-02-02 MED ORDER — CEFAZOLIN SODIUM-DEXTROSE 2-4 GM/100ML-% IV SOLN
INTRAVENOUS | Status: AC
  Administered 2024-02-02: 2 g via INTRAVENOUS
  Filled 2024-02-02: qty 100

## 2024-02-02 MED ORDER — MIDAZOLAM HCL 5 MG/5ML IJ SOLN
INTRAMUSCULAR | Status: DC | PRN
  Administered 2024-02-02 (×2): 2 mg via INTRAVENOUS
  Administered 2024-02-02: 1 mg via INTRAVENOUS

## 2024-02-02 MED ORDER — IOHEXOL 350 MG/ML SOLN
INTRAVENOUS | Status: DC | PRN
  Administered 2024-02-02: 10 mL

## 2024-02-02 MED ORDER — SODIUM CHLORIDE 0.9 % IV SOLN: INTRAVENOUS | Status: DC

## 2024-02-02 MED ORDER — LIDOCAINE HCL (PF) 1 % IJ SOLN
INTRAMUSCULAR | Status: DC | PRN
  Administered 2024-02-02: 45 mL

## 2024-02-02 MED ORDER — SODIUM CHLORIDE 0.9 % IV SOLN
INTRAVENOUS | Status: AC
  Administered 2024-02-02: 80 mg
  Filled 2024-02-02: qty 2

## 2024-02-02 MED ORDER — CEFAZOLIN SODIUM-DEXTROSE 1-4 GM/50ML-% IV SOLN
1.0000 g | Freq: Four times a day (QID) | INTRAVENOUS | Status: AC
  Administered 2024-02-02 – 2024-02-03 (×3): 1 g via INTRAVENOUS
  Filled 2024-02-02 (×3): qty 50

## 2024-02-02 MED ORDER — CEFAZOLIN SODIUM-DEXTROSE 2-4 GM/100ML-% IV SOLN: 2.0000 g | INTRAVENOUS | Status: AC

## 2024-02-02 MED ORDER — CHLORHEXIDINE GLUCONATE 4 % EX SOLN
60.0000 mL | Freq: Once | CUTANEOUS | Status: AC
  Administered 2024-02-02: 4 via TOPICAL
  Filled 2024-02-02: qty 60

## 2024-02-02 MED ORDER — FENTANYL CITRATE (PF) 100 MCG/2ML IJ SOLN
INTRAMUSCULAR | Status: AC
  Filled 2024-02-02: qty 2

## 2024-02-02 SURGICAL SUPPLY — 14 items
CABLE SURGICAL S-101-97-12 (CABLE) ×1 IMPLANT
CATH RIGHTSITE C315HIS02 (CATHETERS) IMPLANT
GUIDEWIRE ANGLED .035X150CM (WIRE) IMPLANT
IPG PACE AZUR XT DR MRI W1DR01 (Pacemaker) IMPLANT
LEAD CAPSURE NOVUS 5076-52CM (Lead) IMPLANT
LEAD SELECT SECURE 3830 383069 (Lead) IMPLANT
PAD DEFIB RADIO PHYSIO CONN (PAD) ×1 IMPLANT
SHEATH 7FR PRELUDE SNAP 13 (SHEATH) IMPLANT
SHEATH 9FR PRELUDE SNAP 13 (SHEATH) IMPLANT
SLITTER 6232ADJ (MISCELLANEOUS) IMPLANT
TRAY PACEMAKER INSERTION (PACKS) ×1 IMPLANT
WIRE ACUITY WHISPER EDS 4648 (WIRE) IMPLANT
WIRE HI TORQ VERSACORE-J 145CM (WIRE) IMPLANT
WIRE MICRO SET SILHO 5FR 7 (SHEATH) IMPLANT

## 2024-02-02 NOTE — Progress Notes (Addendum)
 Notified patient having pain at ppm insertion site and needs additional analgesia. Tylenol  tried but still with breakthrough pain. Will give oxycodone  5mg  x1 and follow response. PDMT reviewed and no inappropriate fills.  Addendum: pharmacy team reached out to inquire plan for heparin , not specified in chart. D/w Dr. Santo who recommends hold overnight and EP to eval for pocket safety/resumption in AM. Pharm team updated.

## 2024-02-02 NOTE — Progress Notes (Addendum)
  Patient Name: Deanna [name] Date of Encounter: 02/02/2024  Primary Cardiologist: None Electrophysiologist: Dr. Waddell  Interval Summary   The patient is doing well today.  She has multiple questions regarding her device placement.   At this time, the patient denies chest pain, shortness of breath, or any new concerns.  Vital Signs    Vitals:   02/02/24 0022 02/02/24 0438 02/02/24 0736 02/02/24 0751  BP: (!) 117/56 135/68 (!) 141/76   Pulse: 71 90 80   Resp: 20 20 18    Temp: 98.1 F (36.7 C) 98.2 F (36.8 C) 97.7 F (36.5 C)   TempSrc: Oral Oral Oral   SpO2: 94% 95% 94% 94%  Weight:      Height:        Intake/Output Summary (Last 24 hours) at 02/02/2024 0910 Last data filed at 02/01/2024 2231 Gross per 24 hour  Intake 240 ml  Output --  Net 240 ml   Filed Weights   01/29/24 1622  Weight: 74.8 kg    Physical Exam    GEN- NAD, Alert and oriented  Lungs- Clear to ausculation bilaterally, normal work of breathing Cardiac- Regular rate and rhythm, no murmurs, rubs or gallops GI- soft, NT, ND, + BS Extremities- no clubbing or cyanosis. No edema  Telemetry    SR 80-90's (personally reviewed)  Hospital Course    Deanna [name] is a 78 y.o. female admitted with ATTR amyloid, VT, AF with intermittent pauses and bradycardia admitted 01/29/24 with shortness of breath in the setting of AF.    Assessment & Plan    Tachy-Bradycardia Syndrome Atrial Fibrillation  ATTR Cardiac Amyloidosis   Symptomatic post conversion pauses and bradycardia. LVEF 46% on cMRI 08/2023. LBBB (QRS 146 ms).   -heparin  on hold since 0600  -anticipate PPM placement 02/02/24 -ok for clear liquid diet until 0900 > then NPO -continue Toprol  100 mg daily -tele monitoring  -will need follow up with Dr. Santo post discharge     For questions or updates, please contact [facility] Please consult [email or web address] for contact info under     Signed, Daphne Barrack, NP-C,  AGACNP-BC  [facility] - Electrophysiology  02/02/2024, 9:10 AM  EP Attending Patient seen and examined. Discussed with Dr. REY. CV with a RRR and split S2 and lungs are clear. Ext with trace peripheral edema. Neuro is non-focal. She has tachy brady with daytime pauses, NSVT, and PAF. She was admitted with AF with RVR. She has reverted back to NSR. She has LBBB and her EF was normal by echo and 46% by MRI. She has never had syncope and her VT was 140/min. She will undergo insertion of a Biv PPM as I expect she will pace over 40% of the time with her h/o heart block and pauses, and LBBB and baseline and need for uptitration of her AV node blocking drugs. I have reviewed this with the patient and the risks/benefits/goals/expectations of the procedure were reviewed and she wishes to proceed. Long term she will be treated for her amyloidosis.   Deanna Shantika Bermea,MD

## 2024-02-03 ENCOUNTER — Encounter (HOSPITAL_COMMUNITY): Payer: Self-pay | Admitting: Internal Medicine

## 2024-02-03 ENCOUNTER — Telehealth: Payer: Self-pay

## 2024-02-03 ENCOUNTER — Inpatient Hospital Stay (HOSPITAL_COMMUNITY): Payer: MEDICARE

## 2024-02-03 LAB — CBC
HCT: 34.7 % — ABNORMAL LOW (ref 36.0–46.0)
Hemoglobin: 11.4 g/dL — ABNORMAL LOW (ref 12.0–15.0)
MCH: 29.5 pg (ref 26.0–34.0)
MCHC: 32.9 g/dL (ref 30.0–36.0)
MCV: 89.7 fL (ref 80.0–100.0)
Platelets: 230 K/uL (ref 150–400)
RBC: 3.87 MIL/uL (ref 3.87–5.11)
RDW: 13.2 % (ref 11.5–15.5)
WBC: 6.8 K/uL (ref 4.0–10.5)
nRBC: 0 % (ref 0.0–0.2)

## 2024-02-03 LAB — APTT: aPTT: 29 s (ref 24–36)

## 2024-02-03 LAB — HEPARIN LEVEL (UNFRACTIONATED): Heparin Unfractionated: <0.1 [IU]/mL — ABNORMAL LOW (ref 0.30–0.70)

## 2024-02-03 NOTE — Care Management Important Message (Signed)
 Important Message  Patient Details  Name: Deanna [name] MRN: [id number] Date of Birth: 28-Jun-1945   Important Message Given:  Yes - Medicare IM     Vonzell Arrie Sharps 02/03/2024, 10:55 AM

## 2024-02-03 NOTE — Progress Notes (Signed)
   02/03/24 1312  TOC Brief Assessment  Insurance and Status Reviewed  Patient has primary care physician Yes  Home environment has been reviewed home alone  Prior level of function: independent  Prior/Current Home Services No current home services  Social Drivers of Health Review SDOH reviewed no interventions necessary  Readmission risk has been reviewed Yes  Transition of care needs no transition of care needs at this time    Pt stable for transition home today. No HH or DME needs noted. Family to transport home.

## 2024-02-03 NOTE — Discharge Instructions (Signed)
 After Your Pacemaker   You have a Medtronic Pacemaker  If you have a Medtronic or Biotronik device, plug in your home monitor once you get home, and no manual interaction is required.   If you have an [employer] or [employer] device, plug your home monitor once you get home, sit near the device, and press the large activation button. Sit nearby until the process is complete, usually notated by lights on the monitor.   If you were set up for monitoring using an app on your phone, make sure the app remains open in the background and the Bluetooth remains on.  ACTIVITY Do not lift your arm above shoulder height for 1 week after your procedure. After 7 days, you may progress as below.  You should remove your sling 24 hours after your procedure, unless otherwise instructed by your provider.     Thursday February 10, 2024  Friday February 11, 2024 Saturday February 12, 2024 Sunday February 13, 2024   Do not lift, push, pull, or carry anything over 10 pounds with the affected arm until 6 weeks (Thursday March 16, 2024 ) after your procedure.   You may drive AFTER your wound check, unless you have been told otherwise by your provider.   Ask your healthcare provider when you can go back to work   INCISION/Dressing HOLD Eliquis  post procedure.  Resume on AM of 02/08/24  Do not remove steri-strips or glue as below.    Monitor your Pacemaker site for redness, swelling, and drainage. Call the device clinic at [phone] if you experience these symptoms or fever/chills.  If your incision is sealed with Steri-strips or staples, you may shower 7 days after your procedure or when told by your provider. Do not remove the steri-strips or let the shower hit directly on your site. You may wash around your site with soap and water .    If you were discharged in a sling, please do not wear this during the day more than 48 hours after your surgery unless otherwise instructed. This may increase the  risk of stiffness and soreness in your shoulder.   Avoid lotions, ointments, or perfumes over your incision until it is well-healed.  You may use a hot tub or a pool AFTER your wound check appointment if the incision is completely closed.  Pacemaker Alerts:  Some alerts are vibratory and others beep. These are NOT emergencies. Please call our office to let us  know. If this occurs at night or on weekends, it can wait until the next business day. Send a remote transmission.  If your device is capable of reading fluid status (for heart failure), you will be offered monthly monitoring to review this with you.   DEVICE MANAGEMENT Remote monitoring is used to monitor your pacemaker from home. This monitoring is scheduled every 91 days by our office. It allows us  to keep an eye on the functioning of your device to ensure it is working properly. You will routinely see your Electrophysiologist annually (more often if necessary).  This will appear as a REMOTE check on your MyChart schedule. These are automatic and there is nothing for you to manually do unless otherwise instructed.  You should receive your ID card for your new device in 4-8 weeks. Keep this card with you at all times once received. Consider wearing a medical alert bracelet or necklace.  Your Pacemaker may be MRI compatible. This will be discussed at your next office visit/wound check.  You should avoid contact  with strong electric or magnetic fields.   Do not use amateur (ham) radio equipment or electric (arc) welding torches. MP3 player headphones with magnets should not be used. Some devices are safe to use if held at least 12 inches (30 cm) from your Pacemaker. These include power tools, lawn mowers, and speakers. If you are unsure if something is safe to use, ask your health care provider.  When using your cell phone, hold it to the ear that is on the opposite side from the Pacemaker. Do not leave your cell phone in a pocket over the  Pacemaker.  You may safely use electric blankets, heating pads, computers, and microwave ovens.  Call the office right away if: You have chest pain. You feel more short of breath than you have felt before. You feel more light-headed than you have felt before. Your incision starts to open up.  This information is not intended to replace advice given to you by your health care provider. Make sure you discuss any questions you have with your health care provider.

## 2024-02-03 NOTE — Progress Notes (Signed)
 Reviewed AVS, patient expressed understanding of medications, MD follow up reviewed.   Removed IV, Site clean, dry and intact.  See LDA for information on wounds at discharge. CCMD contacted and informed patients is being discharged.  Patient states all belongings brought to the hospital at time of admission are accounted for and packed to take home.

## 2024-02-03 NOTE — Discharge Summary (Addendum)
 ELECTROPHYSIOLOGY PROCEDURE DISCHARGE SUMMARY    Patient ID: Deanna [name],  MRN: [id number], DOB/AGE: [birth year] 78 y.o.  Admit date: 01/29/2024 Discharge date: 02/03/2024  Primary Care Physician: Delores Rojelio Caldron, NP  Primary Cardiologist: None  Electrophysiologist: Dr. Waddell   Primary Discharge Diagnosis:  Symptomatic bradycardia status post pacemaker implantation this admission  Secondary Discharge Diagnosis:  Atrial Fibrillation  ATTR Amyloid  VT    Allergies  Allergen Reactions   Codeine Nausea And Vomiting and Palpitations     Nausea and/or Vomiting, chest pain and acid reflux Feels like she is having a heart attack.     Gabapentin  Other (See Comments)    Eyes went numb and blurry  Blurred Vision as well   Gluten Meal Diarrhea and Other (See Comments)    Severe diarrhea per member    Dust Mite Extract     Upper Respiratory issues    Sulfa Antibiotics Other (See Comments)   Terbinafine Hcl Rash     Procedures This Admission:  1.  Implantation of a Medtronic Dual Chamber PPM on 02/02/2024 by Dr. Waddell. The patient received a Medtronic Azure XT DR [id number]  with a Medtronic CapSureFix Novus 5076 right atrial lead and a Medtronic SelectSure 3830 right ventricular lead.  There were no immediate post procedure complications.   2.  CXR on 02/03/2024 demonstrated no pneumothorax status post device implantation.       Brief HPI: Deanna [name] is a 78 y.o. female was admitted for shortness of breath in setting of atrial fibrillation, tachy-brady  and electrophysiology team asked to see for consideration of PPM implantation.  Past medical history includes AF, ATTR amyloidosis, bradycardia with pauses, HTN, lBBB, LVH, GERD, & anxiety.  The patient has had symptomatic bradycardia without reversible causes identified.  Risks, benefits, and alternatives to PPM implantation were reviewed with the patient who wished to proceed.   Hospital Course:  The patient was  admitted and underwent implantation of a Medtronic dual chamber PPM with details as outlined above.  She was monitored on telemetry overnight which demonstrated NSR.  Left chest was without hematoma or ecchymosis.  The device was interrogated and found to be functioning normally.  CXR was obtained and demonstrated no pneumothorax status post device implantation.  Wound care, arm mobility, and restrictions were reviewed with the patient.  The patient was examined and considered stable for discharge to home.    Anticoagulation resumption This patient should resume their Eliquis  on 02/08/24    Physical Exam: Vitals:   02/02/24 2004 02/02/24 2353 02/03/24 0409 02/03/24 0832  BP: (!) 150/76 (!) 130/59 118/62 134/79  Pulse: 85 80 78 69  Resp: 20 20 20 16   Temp: 98.4 F (36.9 C) 98.3 F (36.8 C) 98.3 F (36.8 C) 98.1 F (36.7 C)  TempSrc: Oral Oral Oral Oral  SpO2: 93% 90% 90% 97%  Weight:      Height:        GEN- NAD. A&O x 3.  HEENT: Normocephalic, atraumatic Lungs- CTAB, Normal effort.  Heart- RRR, No M/G/R. PPM site with steri strips intact, no significant swelling, hematoma or ecchymosis.  GI- Soft, NT, ND.  Extremities- No clubbing, cyanosis, or edema;  Skin- warm and dry, no rash or lesion, left chest without hematoma/ecchymosis  Discharge Medications:  Allergies as of 02/03/2024       Reactions   Codeine Nausea And Vomiting, Palpitations    Nausea and/or Vomiting, chest pain and acid reflux Feels like she is  having a heart attack.   Gabapentin  Other (See Comments)   Eyes went numb and blurry Blurred Vision as well   Gluten Meal Diarrhea, Other (See Comments)   Severe diarrhea per member   Dust Mite Extract    Upper Respiratory issues    Sulfa Antibiotics Other (See Comments)   Terbinafine Hcl Rash        Medication List     PAUSE taking these medications    apixaban  5 MG Tabs tablet Wait to take this until: February 08, 2024 Morning Commonly known as:  ELIQUIS  Take 1 tablet (5 mg total) by mouth 2 (two) times daily. Hold medication from 9/15 to 9/21 for right hip surgery       TAKE these medications    albuterol  108 (90 Base) MCG/ACT inhaler Commonly known as: VENTOLIN  HFA Inhale 2 puffs into the lungs every 6 (six) hours as needed for wheezing or shortness of breath.   Breztri  Aerosphere 160-9-4.8 MCG/ACT Aero inhaler Generic drug: budesonide -glycopyrrolate -formoterol  Inhale 2 puffs into the lungs in the morning and at bedtime.   CAL-MAG PO Take 2,000 mg by mouth at bedtime. 1000 mg   CLEAR EYES FOR DRY EYES OP Place 1 drop into both eyes daily.   fluorouracil 5 % cream Commonly known as: EFUDEX Apply 1 Application topically 2 (two) times daily.   hydrochlorothiazide  25 MG tablet Commonly known as: HYDRODIURIL  Take 1 tablet (25 mg total) by mouth 2 (two) times daily.   hydrocortisone  2.5 % cream Apply topically as needed (hemmeroids).   imiquimod 5 % cream Commonly known as: ALDARA as needed (rash).   loratadine  10 MG tablet Commonly known as: CLARITIN  Take 10 mg by mouth daily as needed for allergies.   losartan  50 MG tablet Commonly known as: COZAAR  TAKE 1 TABLET BY MOUTH EVERY DAY   metoprolol  succinate 100 MG 24 hr tablet Commonly known as: TOPROL -XL Take 1 tablet (100 mg total) by mouth daily. Take with or immediately following a meal. What changed: Another medication with the same name was removed. Continue taking this medication, and follow the directions you see here.   milk thistle 175 MG tablet Take 175 mg by mouth daily.   nitroGLYCERIN  0.4 MG SL tablet Commonly known as: NITROSTAT  Place 1 tablet (0.4 mg total) under the tongue every 5 (five) minutes x 3 doses as needed for chest pain.   nystatin  100000 UNIT/ML suspension Commonly known as: MYCOSTATIN  Take 5 mLs (500,000 Units total) by mouth 4 (four) times daily.   omeprazole  40 MG capsule Commonly known as: PRILOSEC Take 1 capsule (40 mg  total) by mouth daily.   PROBIOTIC DAILY PO Take 1 tablet by mouth daily.   rosuvastatin  20 MG tablet Commonly known as: CRESTOR  Take 1 tablet (20 mg total) by mouth daily.   triamcinolone cream 0.1 % Commonly known as: KENALOG Apply topically as needed (rash).   Vitamin A  2400 MCG (8000 UT) Caps Take 16,000 Units by mouth daily. 8000 units   vitamin C  1000 MG tablet Take 4,000 mg by mouth daily.   Vitamin D (Ergocalciferol) 1.25 MG (50000 UNIT) Caps capsule Commonly known as: DRISDOL Take 50,000 Units by mouth once a week. Patient takes on fridays   zolpidem  5 MG tablet Commonly known as: AMBIEN  Take 5 mg by mouth at bedtime.        Disposition:  Home with usual follow up as in AVS   Duration of Discharge Encounter:  APP time: 28 minutes  Signed, Daphne  Aniceto, NP-C, AGACNP-BC  [facility] - Electrophysiology  02/03/2024, 10:43 AM

## 2024-02-03 NOTE — Telephone Encounter (Signed)
 Follow-up after same day discharge: Implant date: 02/02/2024 MD: Danelle Birmingham, MD Device: PPM  Location: L Chest   Wound check visit: no  90 day MD follow-up: no   Remote Transmission received:yes  Dressing/sling removed: n/a  Confirm OAC restart on: n/a  Please continue to monitor your cardiac device site for redness, swelling, and drainage. Call the device clinic at [phone] if you experience these symptoms, fever/chills, or have questions about your device.   Remote monitoring is used to monitor your cardiac device from home. This monitoring is scheduled every 91 days by our office. It allows us  to keep an eye on the functioning of your device to ensure it is working properly.  Unable to reach pt

## 2024-02-04 ENCOUNTER — Ambulatory Visit (HOSPITAL_COMMUNITY): Admission: RE | Admit: 2024-02-04 | Payer: MEDICARE | Source: Home / Self Care | Admitting: Internal Medicine

## 2024-02-04 ENCOUNTER — Encounter (HOSPITAL_COMMUNITY): Admission: RE | Payer: Self-pay | Source: Home / Self Care

## 2024-02-04 SURGERY — PACEMAKER IMPLANT

## 2024-02-07 ENCOUNTER — Encounter: Payer: Self-pay | Admitting: Internal Medicine

## 2024-02-10 DIAGNOSIS — J449 Chronic obstructive pulmonary disease, unspecified: Secondary | ICD-10-CM

## 2024-02-10 MED ORDER — STIOLTO RESPIMAT 2.5-2.5 MCG/ACT IN AERS
2.0000 | INHALATION_SPRAY | Freq: Every day | RESPIRATORY_TRACT | 5 refills | Status: AC
  Filled 2024-03-17 – 2024-05-10 (×2): qty 4, 30d supply, fill #0

## 2024-02-10 NOTE — Telephone Encounter (Signed)
 She was hospitalized 7 days after the nystatin  was sent in so difficult to say whether thrush was present or not. It was diagnosed based on report of symptoms, not exam since she called in. She can stop the Breztri . Possible the ICS is causing irritation. I sent in Stiolto 2 puffs once daily, which has two similar medications but does not have the ICS so will not cause any irritation. Can't use formoterol  in monotherapy. I think she may be thinking about the Incruse she was on previously and the Stiolto has a medication in the same class in it. Thanks.

## 2024-02-11 NOTE — Addendum Note (Signed)
 Addended by: RANDY HAMP SAILOR on: 02/11/2024 01:52 PM   Modules accepted: Orders

## 2024-02-14 NOTE — Telephone Encounter (Signed)
 Dr. Fernande has seen her for the amyloidosis concern. Will route to him.

## 2024-02-14 NOTE — Telephone Encounter (Signed)
 S: appointment  B: Amyloidosis   A: direct call received from the pt,she reports that she had some scans done at [facility] in  August for her amyloidosis. The provider saw something concerning on the scan and advised the pt to reach out to her Oncologist. She is scheduled to see Dr Renold on 11/10.  R: please advise if her appointment needs to be moved up.  thanks

## 2024-02-15 ENCOUNTER — Telehealth: Payer: Self-pay | Admitting: Hematology

## 2024-02-15 NOTE — Telephone Encounter (Signed)
 Scheduled appointments per referral. Talked with the patient and she is aware of the appointment time and date as well as the address. Patient was informed to arrive 10-15 minutes prior with updated insurance information. Patient was also informed that two guests are permitted but needs to be at least 77 years of age. All questions were answered. Patient is transferring from Dr.Dorsey.

## 2024-02-16 ENCOUNTER — Other Ambulatory Visit: Payer: Self-pay

## 2024-02-16 DIAGNOSIS — R0600 Dyspnea, unspecified: Secondary | ICD-10-CM | POA: Insufficient documentation

## 2024-02-17 ENCOUNTER — Ambulatory Visit: Payer: MEDICARE | Attending: Cardiology

## 2024-02-17 ENCOUNTER — Ambulatory Visit: Payer: MEDICARE

## 2024-02-17 DIAGNOSIS — I442 Atrioventricular block, complete: Secondary | ICD-10-CM | POA: Insufficient documentation

## 2024-02-17 DIAGNOSIS — I447 Left bundle-branch block, unspecified: Secondary | ICD-10-CM | POA: Diagnosis present

## 2024-02-17 LAB — CUP PACEART INCLINIC DEVICE CHECK
Date Time Interrogation Session: 20250918141830
Implantable Lead Connection Status: 753985
Implantable Lead Connection Status: 753985
Implantable Lead Implant Date: 20250903
Implantable Lead Implant Date: 20250903
Implantable Lead Location: 753859
Implantable Lead Location: 753860
Implantable Lead Model: 3830
Implantable Lead Model: 5076
Implantable Pulse Generator Implant Date: 20250903

## 2024-02-17 NOTE — Patient Instructions (Signed)

## 2024-02-17 NOTE — Progress Notes (Signed)
 Normal dual chamber pacemaker wound check. Presenting rhythm: AS/VP 76. Wound well healed. Routine testing performed. Thresholds, sensing, and impedance consistent with implant measurements and at 3.5V safety margin/auto capture until 3 month visit. No episodes. Reviewed arm restrictions to continue for 6 weeks total post op.  Pt enrolled in remote follow-up.

## 2024-02-18 ENCOUNTER — Ambulatory Visit: Payer: MEDICARE

## 2024-02-18 VITALS — BP 118/50 | HR 70 | Ht 62.0 in | Wt 163.2 lb

## 2024-02-18 DIAGNOSIS — E8582 Wild-type transthyretin-related (ATTR) amyloidosis: Secondary | ICD-10-CM | POA: Diagnosis not present

## 2024-02-18 DIAGNOSIS — I480 Paroxysmal atrial fibrillation: Secondary | ICD-10-CM | POA: Diagnosis present

## 2024-02-18 DIAGNOSIS — I509 Heart failure, unspecified: Secondary | ICD-10-CM | POA: Insufficient documentation

## 2024-02-18 DIAGNOSIS — I5042 Chronic combined systolic (congestive) and diastolic (congestive) heart failure: Secondary | ICD-10-CM | POA: Diagnosis not present

## 2024-02-18 HISTORY — DX: Heart failure, unspecified: I50.9

## 2024-02-18 MED ORDER — FUROSEMIDE 20 MG PO TABS: 20.0000 mg | ORAL_TABLET | Freq: Every day | ORAL | 3 refills | Status: DC

## 2024-02-18 MED ORDER — SPIRONOLACTONE 25 MG PO TABS: 25.0000 mg | ORAL_TABLET | Freq: Every day | ORAL | 3 refills | Status: DC

## 2024-02-18 MED ORDER — EMPAGLIFLOZIN 10 MG PO TABS: 10.0000 mg | ORAL_TABLET | Freq: Every day | ORAL | 3 refills | Status: DC

## 2024-02-18 NOTE — Assessment & Plan Note (Signed)
 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  Continue with salt and fluid restriction. Continue with salt restriction to less than 2 g/day. Fluid restriction to below 2 L/day.  Will start low-dose furosemide  20 mg once daily. Blood work BMP and magnesium , CBC in 1 week.  Guideline directed medical therapy importance and role in treatment were discussed. Continue metoprolol  succinate 100 mg once daily. Continue losartan  50 mg once daily. Start spironolactone  25 mg once daily. Start Jardiance  10 mg once daily. Disease risk benefits discussed.

## 2024-02-18 NOTE — Progress Notes (Signed)
 Cardiology Consultation:    Date:  02/18/2024   ID:  Deanna [name], DOB [birth year], MRN [id number]  PCP:  Deanna Rojelio Caldron, NP  Cardiologist:  Deanna SAUNDERS Christian Treadway, MD   Referring MD: Deanna Rojelio Caldron, NP   No chief complaint on file.    ASSESSMENT AND PLAN:   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  Here for follow-up visit accompanied by her daughter.  Problem List Items Addressed This Visit     A-fib [facility])   Paroxysmal atrial fibrillation diagnosed October 2024. With amyloid cardiomyopathy, irrespective of the CHADS2 Vascor would recommend anticoagulation for stroke prophylaxis. On anticoagulation with Eliquis  5 mg twice daily.       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  Discussed the disease nature. Treatment options evaluated. Will coordinate with Deanna [name] with regards to amyloid specific drug therapies. They wish to keep office visits to the minimum.  Discussed with them specialized clinics for amyloidosis would have more access within our information about upcoming trials and I would encourage  follow-ups. I can initiate the medication regimen in coordination with Deanna [name].       CHF (congestive heart failure) (HCC) - Primary   Chronic mildly reduced LV function secondary to amyloidosis.  Continue with salt and fluid restriction. Continue with salt restriction to less than 2 g/day. Fluid restriction to below 2 L/day.  Will start low-dose furosemide  20 mg once daily. Blood work BMP and magnesium , CBC in 1 week.  Guideline directed medical therapy importance and role in treatment were discussed. Continue metoprolol  succinate 100 mg once daily. Continue losartan  50 mg once daily. Start spironolactone  25 mg once daily. Start Jardiance  10 mg once daily. Disease risk benefits discussed.       Relevant Medications   spironolactone  (ALDACTONE ) 25 MG tablet   furosemide  (LASIX ) 20 MG tablet   Other Relevant Orders   Basic Metabolic Panel (BMET)   Magnesium    CBC   Pro b natriuretic peptide (BNP)   Return to clinic tentatively in 2 months.   History of Present Illness:    Deanna [name] is a 78 y.o. female who is being seen today for follow-up visit. PCP is Deanna Rojelio Caldron, NP. Last visit with me in the office was 08/20/2023. Has establish care with Deanna [name] for amyloidosis management. Has establish care with EP Deanna [name] for pacemaker implant.  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  No  ischemia on nuclear imaging stress test from September 2024. Cardiac MRI 09/29/2023 with LVEF 46%, severe asymmetric hypertrophy of basal septum and basal inferior wall, diffuse mild hypokinesis, normal coronary LGE pattern suspicious for cardiac amyloidosis. Treadmill stress echocardiogram 12/30/2023 noted no significant increase in LV outflow tract obstruction after 3 minutes exercise attaining 68.4% MPHR at peak stress PYP scan 01/19/2024 abnormal and suggestive of ATTR amyloidosis.  Last pacemaker check 02/17/2024 reported normal functioning dual-chamber pacemaker, ventricular pacing 76% of the time.  Here for the visit today accompanied by her daughter. Mentions she continues to be easily tired and fatigued.  She had multiple questions and she had concerns about the coordination and timing of care. Her major issue is trying to avoid multiple visits notably having to go to [place] and seeing multiple providers and wants to coordinate at one location. I reviewed the specialization of care between electrophysiologist and general cardiologist and further evaluation with cardiologist with specialization in caring for patients with amyloidosis/hypertrophic cardiomyopathy.  Offered that I will proceed with coordinating care with Deanna [name] going forward and try to minimize her but of visits.  No pedal edema. She has been taking medications consistently but Eliquis  has been once a day since discharge from the hospital.   Past Medical History:  Diagnosis Date   A-fib (HCC)    Abnormal glucose 03/06/2020   Acute coronary syndrome (HCC) 02/09/2023   Age-related osteoporosis without current pathological fracture 11/22/2015   Overview:  Femoral neck  Formatting of this note might be different from the original. Femoral neck   Allergic rhinitis due to pollen 08/19/2015   Allergy    Aortic atherosclerosis (HCC) 04/04/2021   Arthritis    Atrial fibrillation (HCC) 01/29/2024   Cardiac  arrhythmia 01/11/2024   Carpal tunnel syndrome, bilateral 02/28/2002   Overview:  H/O CTS   Celiac disease    Cervical disc disorder 05/10/2009   Chronic cough 02/15/2006   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 11/26/2015   Formatting of this note might be different from the original. 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 Brenita - hyperplastic polyp(s) were removed - repeat in 5 years   Hypertension    Hypertrophic cardiomyopathy (HCC) 10/10/2023   Cardiomyopathy with severe asymmetric septal hypertrophy on echocardiogram  Cardiac MRI April 2025: LVEF 45%, late gadolinium enhancement in noncoronary distribution, possible amyloidosis.  Cannot exclude hypertrophic cardiomyopathy     Incomplete uterovaginal prolapse 03/04/2004   Insomnia    Left bundle-branch block 05/20/2006   Left ventricular hypertrophy 05/23/2015   Lumbar radiculopathy 01/09/2016   Formatting of this note might be different from the original. Added automatically from request for surgery [id number]   LVH (left ventricular 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 05/23/2015   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  Mitral regurgitation 04/04/2021   Mixed dyslipidemia 04/04/2021   Non-rheumatic mitral regurgitation 03/21/2015   Osteopenia determined by x-ray 11/22/2015   Formatting of this note might be different  from the original. Femoral neck   Other intervertebral disc degeneration, lumbar region 01/29/2015   Pre-operative cardiovascular examination 09/03/2020   Primary osteoarthritis, right hand 02/28/2002   Overview:  OA HANDS, ESP THUMBS   Radial tunnel syndrome, right 11/04/2016   Radiculopathy, lumbar region 11/22/2014   Overview:  Added automatically from request for surgery [id number]   Rhinitis, allergic 02/24/2002   Overview:  ALLERGY SYMPTOMS - states was seen by allergist in past and informed allergic to dust   S/P left total hip arthroplasty 09/17/2020   S/P total right hip arthroplasty 02/18/2023   Sacroiliitis (HCC) 08/02/2019   Screening for colon cancer 04/17/2005   Formatting of this note might be different from the original. 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 TRIGGER THUMBS - started spring 2003. Injected 02/28/02   Unspecified asthma, uncomplicated 12/25/2004    Past Surgical History:  Procedure Laterality Date   ABDOMINAL HYSTERECTOMY  2010   APPENDECTOMY  1969   BIV PACEMAKER INSERTION CRT-P N/A 02/02/2024   Procedure: BIV PACEMAKER INSERTION CRT-P;  Surgeon: [name] Danelle ORN, MD;  [facility]: [facility];  Service: Cardiovascular;  Laterality: N/A;   CHOLECYSTECTOMY  1969   SPINAL FUSION  01/2016   TONSILLECTOMY AND ADENOIDECTOMY  1956   TOTAL HIP ARTHROPLASTY Left 09/17/2020   Procedure: TOTAL HIP ARTHROPLASTY ANTERIOR APPROACH;  Surgeon: Ernie Cough, MD;  Location: [facility] ORS;  Service: Orthopedics;  Laterality: Left;  70 mins   TOTAL HIP ARTHROPLASTY Right 02/18/2023   Procedure: TOTAL HIP ARTHROPLASTY ANTERIOR  APPROACH;  Surgeon: Ernie Cough, MD;  Location: [facility] ORS;  Service: Orthopedics;  Laterality: Right;    Current Medications: Current Meds  Medication Sig   albuterol  (VENTOLIN  HFA) 108 (90 Base) MCG/ACT inhaler Inhale 2 puffs into the lungs every 6 (six) hours as needed for wheezing or shortness of breath.   apixaban  (ELIQUIS ) 5 MG TABS tablet Take 1 tablet (5 mg total) by mouth 2 (two) times daily. Hold medication from 9/15 to 9/21 for right hip surgery   Ascorbic Acid  (VITAMIN C ) 1000 MG tablet Take 4,000 mg by mouth daily.   Calcium -Magnesium  (CAL-MAG PO) Take 2,000 mg by mouth at bedtime. 1000 mg   Carboxymethylcellul-Glycerin  (CLEAR EYES FOR DRY EYES OP) Place 1 drop into both eyes daily.   empagliflozin  (JARDIANCE ) 10 MG TABS tablet Take 1 tablet (10 mg total) by mouth daily before breakfast.   fluorouracil (EFUDEX) 5 % cream Apply 1 Application topically 2 (two) times daily.   fluticasone (FLONASE) 50 MCG/ACT nasal spray Place 2 sprays into both nostrils daily as needed.   furosemide  (LASIX ) 20 MG tablet Take 1 tablet (20 mg total) by mouth daily.   hydrocortisone  2.5 % cream Apply topically as needed (hemmeroids).   loratadine  (CLARITIN ) 10 MG tablet Take 10 mg by mouth daily as needed for allergies.   losartan  (COZAAR ) 50 MG tablet TAKE 1 TABLET BY MOUTH EVERY DAY   metoprolol  succinate (TOPROL -XL) 100 MG  24 hr tablet Take 1 tablet (100 mg total) by mouth daily. Take with or immediately following a meal.   milk thistle 175 MG tablet Take 175 mg by mouth daily.   nitroGLYCERIN  (NITROSTAT ) 0.4 MG SL tablet Place 1 tablet (0.4 mg total) under the tongue every 5 (five) minutes x 3 doses as needed for chest pain.   omeprazole  (PRILOSEC) 40 MG capsule Take 1 capsule (40 mg total) by mouth daily.   Probiotic Product (PROBIOTIC DAILY PO) Take 1 tablet by mouth daily.   rosuvastatin  (CRESTOR ) 20 MG tablet Take 1 tablet (20 mg total) by mouth daily.   spironolactone  (ALDACTONE ) 25 MG tablet  Take 1 tablet (25 mg total) by mouth daily.   Tiotropium Bromide-Olodaterol (STIOLTO RESPIMAT ) 2.5-2.5 MCG/ACT AERS Inhale 2 puffs into the lungs daily.   triamcinolone cream (KENALOG) 0.1 % Apply topically as needed (rash).   Vitamin A  2400 MCG (8000 UT) CAPS Take 16,000 Units by mouth daily. 8000 units   Vitamin D, Ergocalciferol, (DRISDOL) 1.25 MG (50000 UNIT) CAPS capsule Take 50,000 Units by mouth once a week. Patient takes on fridays   zolpidem  (AMBIEN ) 5 MG tablet Take 5 mg by mouth at bedtime.   [DISCONTINUED] hydrochlorothiazide  (HYDRODIURIL ) 25 MG tablet Take 1 tablet (25 mg total) by mouth 2 (two) times daily.     Allergies:   Codeine, Gabapentin , Gluten meal, Iodinated contrast media, Lisinopril, Dust mite extract, Sulfa antibiotics, and Terbinafine hcl   Social History   Socioeconomic History   Marital status: Divorced    Spouse name: Not on file   Number of children: 2   Years of education: 2 years college   Highest education level: Not on file  Occupational History   Occupation: Retired  Tobacco Use   Smoking status: Former    Current packs/day: 0.00    Average packs/day: 0.5 packs/day for 30.0 years (15.0 ttl pk-yrs)    Types: Cigarettes    Start date: 01/01/1968    Quit date: 12/31/1997    Years since quitting: 26.1   Smokeless tobacco: Never   Tobacco comments:    Quit in 1999  Vaping Use   Vaping status: Never Used  Substance and Sexual Activity   Alcohol  use: Not Currently   Drug use: No   Sexual activity: Not Currently    Birth control/protection: Post-menopausal  Other Topics Concern   Not on file  Social History Narrative   Lives alone.   Right-handed.   One cup caffeine per day.   Social Drivers of Health   Financial Resource Strain: High Risk (01/14/2022)   Overall Financial Resource Strain (CARDIA)    Difficulty of Paying Living Expenses: Hard  Food Insecurity: Patient Declined (01/30/2024)   Hunger Vital Sign    Worried About Running Out of  Food in the Last Year: Patient declined    Ran Out of Food in the Last Year: Patient declined  Transportation Needs: Patient Declined (01/30/2024)   PRAPARE - Administrator, Civil Service (Medical): Patient declined    Lack of Transportation (Non-Medical): Patient declined  Physical Activity: Inactive (01/14/2022)   Exercise Vital Sign    Days of Exercise per Week: 0 days    Minutes of Exercise per Session: 0 min  Stress: Stress Concern Present (01/14/2022)   [employer] of Occupational Health - Occupational Stress Questionnaire    Feeling of Stress : To some extent  Social Connections: Patient Declined (01/30/2024)   Social Connection and Isolation Panel  Frequency of Communication with Friends and Family: Patient declined    Frequency of Social Gatherings with Friends and Family: Patient declined    Attends Religious Services: Patient declined    Database administrator or Organizations: Patient declined    Attends Engineer, structural: Patient declined    Marital Status: Patient declined     Family History: The patient's family history includes Arthritis in her mother; Diabetes in her mother and sister; Early death in her father and sister; Leukemia in her father and sister; Pancreatic cancer (age of onset: 8) in her mother. ROS:   Please see the history of present illness.    All 14 point review of systems negative except as described per history of present illness.  EKGs/Labs/Other Studies Reviewed:    The following studies were reviewed today:   EKG:       Recent Labs: 01/29/2024: ALT 22; B Natriuretic Peptide 116.4; Magnesium  1.6 01/30/2024: BUN 7; Creatinine, Ser 0.65; Potassium 4.8; Sodium 134 02/03/2024: Hemoglobin 11.4; Platelets 230  Recent Lipid Panel    Component Value Date/Time   CHOL 175 02/10/2023 0653   CHOL 153 11/04/2021 1031   TRIG 186 (H) 02/10/2023 0653   HDL 42 02/10/2023 0653   HDL 54 11/04/2021 1031   CHOLHDL 4.2  02/10/2023 0653   VLDL 37 02/10/2023 0653   LDLCALC 96 02/10/2023 0653   LDLCALC 89 11/04/2021 1031    Physical Exam:    VS:  BP (!) 118/50   Pulse 70   Ht 5' 2 (1.575 m)   Wt 163 lb 3.2 oz (74 kg)   SpO2 97%   BMI 29.85 kg/m     Wt Readings from Last 3 Encounters:  02/18/24 163 lb 3.2 oz (74 kg)  01/29/24 165 lb (74.8 kg)  01/13/24 166 lb (75.3 kg)     GENERAL:  Well nourished, well developed in no acute distress NECK: No JVD; No carotid bruits CARDIAC: RRR, S1 and S2 present, no murmurs, no rubs, no gallops Left infraclavicular pacer location healing well. CHEST:  Clear to auscultation without rales, wheezing or rhonchi  Extremities: No pitting pedal edema. Pulses bilaterally symmetric with radial 2+ and dorsalis pedis 2+ NEUROLOGIC:  Alert and oriented x 3  Medication Adjustments/Labs and Tests Ordered: Current medicines are reviewed at length with the patient today.  Concerns regarding medicines are outlined above.  Orders Placed This Encounter  Procedures   Basic Metabolic Panel (BMET)   Magnesium    CBC   Pro b natriuretic peptide (BNP)   Meds ordered this encounter  Medications   empagliflozin  (JARDIANCE ) 10 MG TABS tablet    Sig: Take 1 tablet (10 mg total) by mouth daily before breakfast.    Dispense:  90 tablet    Refill:  3   spironolactone  (ALDACTONE ) 25 MG tablet    Sig: Take 1 tablet (25 mg total) by mouth daily.    Dispense:  90 tablet    Refill:  3   furosemide  (LASIX ) 20 MG tablet    Sig: Take 1 tablet (20 mg total) by mouth daily.    Dispense:  90 tablet    Refill:  3    Signed, Nevaeh Korte reddy Hawraa Stambaugh, MD, MPH, [facility]. 02/18/2024 5:31 PM    [facility]

## 2024-02-18 NOTE — Patient Instructions (Signed)
 Medication Instructions:  Your physician has recommended you make the following change in your medication:   START: Jardiance  10 mg daily START: Aldactone  25 mg daily START: Lasix  20 mg daily STOP: Hydrochlorothiazide   *If you need a refill on your cardiac medications before your next appointment, please call your pharmacy*  Lab Work: Your physician recommends that you return for lab work in:   Labs in 1 week: BMP, Magnesium , CBC, Pro BNP  If you have labs (blood work) drawn today and your tests are completely normal, you will receive your results only by: MyChart Message (if you have MyChart) OR A paper copy in the mail If you have any lab test that is abnormal or we need to change your treatment, we will call you to review the results.  Testing/Procedures: None  Follow-Up: At [facility], you and your health needs are our priority.  As part of our continuing mission to provide you with exceptional heart care, our providers are all part of one team.  This team includes your primary Cardiologist (physician) and Advanced Practice Providers or APPs (Physician Assistants and Nurse Practitioners) who all work together to provide you with the care you need, when you need it.  Your next appointment:   2 month(s)  Provider:   Alean Kobus, MD    We recommend signing up for the patient portal called MyChart.  Sign up information is provided on this After Visit Summary.  MyChart is used to connect with patients for Virtual Visits (Telemedicine).  Patients are able to view lab/test results, encounter notes, upcoming appointments, etc.  Non-urgent messages can be sent to your provider as well.   To learn more about what you can do with MyChart, go to [email or web address].   Other Instructions None

## 2024-02-18 NOTE — Assessment & Plan Note (Signed)
 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  Discussed the disease nature. Treatment options evaluated. Will coordinate with Dr. Arnetha with regards to amyloid specific drug therapies. They wish to keep office visits to the minimum.  Discussed with them specialized clinics for amyloidosis would have more access within our information about upcoming trials and I would encourage follow-ups. I can initiate the medication regimen in coordination with Dr. Arnetha.

## 2024-02-18 NOTE — Assessment & Plan Note (Signed)
 Paroxysmal atrial fibrillation diagnosed October 2024. With amyloid cardiomyopathy, irrespective of the CHADS2 Vascor would recommend anticoagulation for stroke prophylaxis. On anticoagulation with Eliquis  5 mg twice daily.

## 2024-02-21 ENCOUNTER — Encounter: Payer: MEDICARE | Admitting: Genetic Counselor

## 2024-02-21 ENCOUNTER — Telehealth: Payer: Self-pay

## 2024-02-21 ENCOUNTER — Encounter: Payer: MEDICARE | Admitting: Internal Medicine

## 2024-02-21 NOTE — Telephone Encounter (Signed)
*  STAT* If patient is at the pharmacy, call can be transferred to refill team.   1. Which medications need to be refilled? (please list name of each medication and dose if known)   furosemide  (LASIX ) 20 MG tablet    empagliflozin  (JARDIANCE ) 10 MG TABS tablet   spironolactone  (ALDACTONE ) 25 MG tablet    2. Would you like to learn more about the convenience, safety, & potential cost savings by using the [facility]? Yes    3. Are you open to using the [facility] (Type [facility]. yes ).   4. Which pharmacy/location (including street and city if local pharmacy) is medication to be sent to? [facility]    5. Do they need a 30 day or 90 day supply? 90 day supply   Patient had meds sent to [facility] they didn't have enough to fill Pt is switching to [facility]. Please fill asap since pt has been waiting

## 2024-02-25 ENCOUNTER — Other Ambulatory Visit (HOSPITAL_BASED_OUTPATIENT_CLINIC_OR_DEPARTMENT_OTHER): Payer: Self-pay

## 2024-02-25 MED ORDER — TRAMADOL HCL 50 MG PO TABS
50.0000 mg | ORAL_TABLET | Freq: Four times a day (QID) | ORAL | 1 refills | Status: DC
  Filled 2024-02-25: qty 20, 5d supply, fill #0
  Filled 2024-03-17: qty 20, 5d supply, fill #1

## 2024-02-28 ENCOUNTER — Ambulatory Visit: Payer: MEDICARE | Admitting: Nurse Practitioner

## 2024-03-01 LAB — BASIC METABOLIC PANEL WITH GFR
BUN/Creatinine Ratio: 17 (ref 12–28)
BUN: 13 mg/dL (ref 8–27)
CO2: 26 mmol/L (ref 20–29)
Calcium: 10.1 mg/dL (ref 8.7–10.3)
Chloride: 97 mmol/L (ref 96–106)
Creatinine, Ser: 0.77 mg/dL (ref 0.57–1.00)
Glucose: 108 mg/dL — ABNORMAL HIGH (ref 70–99)
Potassium: 4.8 mmol/L (ref 3.5–5.2)
Sodium: 136 mmol/L (ref 134–144)
eGFR: 80 mL/min/1.73 (ref >59)

## 2024-03-01 LAB — CBC
Hematocrit: 40.2 % (ref 34.0–46.6)
Hemoglobin: 13.3 g/dL (ref 11.1–15.9)
MCH: 30.1 pg (ref 26.6–33.0)
MCHC: 33.1 g/dL (ref 31.5–35.7)
MCV: 91 fL (ref 79–97)
Platelets: 282 x10E3/uL (ref 150–450)
RBC: 4.42 x10E6/uL (ref 3.77–5.28)
RDW: 12.8 % (ref 11.7–15.4)
WBC: 8.7 x10E3/uL (ref 3.4–10.8)

## 2024-03-01 LAB — MAGNESIUM: Magnesium: 2.3 mg/dL (ref 1.6–2.3)

## 2024-03-01 LAB — PRO B NATRIURETIC PEPTIDE: NT-Pro BNP: 222 pg/mL (ref 0–738)

## 2024-03-03 ENCOUNTER — Telehealth: Payer: Self-pay

## 2024-03-03 NOTE — Telephone Encounter (Signed)
 Pt is requesting a callback regarding her wanting to know if her 10/7 is necessary since she was just seen on 02/18/24 and was told to come back in 2 months. Please advise

## 2024-03-07 ENCOUNTER — Ambulatory Visit: Payer: MEDICARE | Admitting: Physician Assistant

## 2024-03-07 ENCOUNTER — Ambulatory Visit: Payer: MEDICARE | Admitting: *Deleted

## 2024-03-07 DIAGNOSIS — R0602 Shortness of breath: Secondary | ICD-10-CM | POA: Diagnosis not present

## 2024-03-07 DIAGNOSIS — J449 Chronic obstructive pulmonary disease, unspecified: Secondary | ICD-10-CM

## 2024-03-07 LAB — PULMONARY FUNCTION TEST
DL/VA % pred: 92 %
DL/VA: 3.84 ml/min/mmHg/L
DLCO cor % pred: 77 %
DLCO cor: 13.78 ml/min/mmHg
DLCO unc % pred: 77 %
DLCO unc: 13.74 ml/min/mmHg
FEF 25-75 Post: 1.03 L/s
FEF 25-75 Pre: 0.88 L/s
FEF2575-%Change-Post: 16 %
FEF2575-%Pred-Post: 68 %
FEF2575-%Pred-Pre: 58 %
FEV1-%Change-Post: 3 %
FEV1-%Pred-Post: 74 %
FEV1-%Pred-Pre: 71 %
FEV1-Post: 1.4 L
FEV1-Pre: 1.36 L
FEV1FVC-%Change-Post: 4 %
FEV1FVC-%Pred-Pre: 95 %
FEV6-%Change-Post: 0 %
FEV6-%Pred-Post: 77 %
FEV6-%Pred-Pre: 78 %
FEV6-Post: 1.87 L
FEV6-Pre: 1.89 L
FEV6FVC-%Change-Post: 0 %
FEV6FVC-%Pred-Post: 105 %
FEV6FVC-%Pred-Pre: 104 %
FVC-%Change-Post: 0 %
FVC-%Pred-Post: 74 %
FVC-%Pred-Pre: 74 %
FVC-Post: 1.88 L
FVC-Pre: 1.9 L
Post FEV1/FVC ratio: 75 %
Post FEV6/FVC ratio: 100 %
Pre FEV1/FVC ratio: 72 %
Pre FEV6/FVC Ratio: 99 %
RV % pred: 168 %
RV: 3.71 L
TLC % pred: 116 %
TLC: 5.54 L

## 2024-03-07 NOTE — Patient Instructions (Signed)
 Full PFT performed today.

## 2024-03-07 NOTE — Progress Notes (Signed)
 Full PFT performed today.

## 2024-03-10 ENCOUNTER — Encounter: Payer: Self-pay | Admitting: Nurse Practitioner

## 2024-03-10 ENCOUNTER — Ambulatory Visit (INDEPENDENT_AMBULATORY_CARE_PROVIDER_SITE_OTHER): Payer: MEDICARE | Admitting: Nurse Practitioner

## 2024-03-10 VITALS — BP 122/68 | HR 94 | Temp 98.2°F | Ht 62.0 in | Wt 163.8 lb

## 2024-03-10 DIAGNOSIS — K219 Gastro-esophageal reflux disease without esophagitis: Secondary | ICD-10-CM | POA: Diagnosis not present

## 2024-03-10 DIAGNOSIS — Z950 Presence of cardiac pacemaker: Secondary | ICD-10-CM

## 2024-03-10 DIAGNOSIS — J449 Chronic obstructive pulmonary disease, unspecified: Secondary | ICD-10-CM | POA: Diagnosis not present

## 2024-03-10 DIAGNOSIS — J302 Other seasonal allergic rhinitis: Secondary | ICD-10-CM

## 2024-03-10 DIAGNOSIS — R0609 Other forms of dyspnea: Secondary | ICD-10-CM | POA: Diagnosis not present

## 2024-03-10 NOTE — Assessment & Plan Note (Signed)
 Moderate COPD with FEV1 71%. Progressive DOE seemed to be cardiac in nature and has slowly improved following pacemaker implant. Unable to tolerate ICS due to upper airway irritation and thrush. Doing well on Stiolto. Compensated on current regimen. Cough substantially improved with GERD control. Graded exercises encouraged. Trigger prevention reviewed. Action plan in place.  Patient Instructions  Continue Stiolto 2 puffs daily. Brush tongue and rinse mouth afterwards. Brush tongue and rinse afterwards Continue Albuterol  inhaler 2 puffs every 6 hours as needed for shortness of breath or wheezing. Notify if symptoms persist despite rescue inhaler/neb use.  Continue claritin  daily Continue saline rinses 1-2 times a day, use bottled distilled water  Continue flonase 2 sprays each nostril daily; wait 30 minutes after the rinses to do the flonase Continue Omeprazole  40 mg daily - take in AM, 30 minutes prior to breakfast    Follow up with the heart doctor as scheduled  Follow up in 4 months with any new pulmonary doctor to establish care at [facility] office (does not live in [place] so needs to change providers). If symptoms do not improve or worsen, please contact office for sooner follow up or seek emergency care.

## 2024-03-10 NOTE — Assessment & Plan Note (Signed)
 Improved cough with initiation of PPI therapy for reflux. Reviewed risks of long term PPI use. Verbalized understanding. GERD precautions reviewed.

## 2024-03-10 NOTE — Assessment & Plan Note (Signed)
Continue allergy regimen °

## 2024-03-10 NOTE — Assessment & Plan Note (Signed)
 Worsening DOE over the last year. Recent stress test with normal EF. However, had a cardiac monitor that showed sustained runs of VT and episodes of complete heart block resulting in pacemaker implant. Clinically improving postop. See above

## 2024-03-10 NOTE — Patient Instructions (Addendum)
 Continue Stiolto 2 puffs daily. Brush tongue and rinse mouth afterwards. Brush tongue and rinse afterwards Continue Albuterol  inhaler 2 puffs every 6 hours as needed for shortness of breath or wheezing. Notify if symptoms persist despite rescue inhaler/neb use.  Continue claritin  daily Continue saline rinses 1-2 times a day, use bottled distilled water  Continue flonase 2 sprays each nostril daily; wait 30 minutes after the rinses to do the flonase Continue Omeprazole  40 mg daily - take in AM, 30 minutes prior to breakfast    Follow up with the heart doctor as scheduled  Follow up in 4 months with any new pulmonary doctor to establish care at [facility] office (does not live in [place] so needs to change providers). If symptoms do not improve or worsen, please contact office for sooner follow up or seek emergency care.

## 2024-03-10 NOTE — Progress Notes (Signed)
 @Patient  ID: Deanna [name], female    DOB: 05-13-1946, 78 y.o.   MRN: [id number]  Chief Complaint  Patient presents with   Shortness of Breath    Sob is better since she got the pacemaker (9/5).  She still gets sob with normal ADLs.    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 She is a patient of Dr. Nelda and last seen in office 01/11/2024 by [facility] [place] NP. 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   TEST/EVENTS:  12/12/2022 CTA chest: no PE. Mild emphysema. Age indeterminate T10 compression fx. Subacute or chronic sixth right rib fx 02/09/2023 PFT: FVC 69, FEV1 68, ratio 72, TLC 101, DLCOunc 73 corrects to 88 for alveolar volume. No BD. Moderate obstruction  09/29/2023 MR cardiac: EF 46%, diffuse mild hypokinesis; normal RV function and size 12/30/2023 stress echo: no stress-induced outflow obstrucion; EF 75% with peak 80%, mild MR 03/07/2024 PFT: FVC 74, FEV1 71, ratio 75, TLC 116, DLCO 77 corrects to normal for alveolar volume   06/09/2023: OV with Dr. Malka. Prior consult for DOE x 6 months. Mostly exertional. No associated symptoms. Prior CTA chest 11/2022 negative for any lung parenchyma abnormalities or PE. PFT with moderate COPD. Echo with GIDD, severe hypertrophy. Nuclear stress testing without any significant signs of ischemia. Start Symbicort  160 mcg Twice daily. Refer to pulmonary rehab. F/u 6 months.  01/11/2024: SHERLEAN with Krystyne Tewksbury NP Deanna [name] is a 78 year old female with moderate COPD who presents with worsening respiratory symptoms. She was referred by her heart doctor for evaluation of her respiratory symptoms. Her respiratory symptoms have worsened since her  last lung function testing in September of the previous year. She experiences increased difficulty breathing with most activities, and particularly when lying flat at night. She has occasional wheezing. She uses albuterol  as a rescue inhaler, which provides relief. She has been using Incruse, two puffs once a day, and was under the impression that Symbicort  was replaced by Incruse. She has not been using Symbicort . Still using rescue a few times a day.  She experiences a cough that worsens after eating. She occasionally gags with the cough/mucus and has difficulty catching her breath. Mucus is clear. She doesn't necessarily feel like she has heartburn but isn't sure. She is not currently taking any medication for reflux but uses a probiotic. She has been using Flonase intermittently and nasal rinses for nasal congestion and takes Claritin  for allergies, which were previously identified as dust-related. Has not had allergy testing in quite some time.  She has a history of moderate COPD.  She recently wore a cardiac monitor which showed sustained runs of ventricular tachycardia and intermittent complete heart block. Her heart rate drops significantly during sleep as well. She is waiting on a visit with Dr. Waddell from EP 8/14. She has occasional lightheadedness/dizziness but feels like this is better than it was a month ago. No CP, syncope, palpitations.  Her recent blood work showed slightly elevated eosinophils at 200. No fever, hemoptysis, or leg swelling. No weight loss or anorexia.  She is on Eliquis ; denies any missed doses. No bloody stools, excessive bruising. Recent hgb was normal.  FeNO 17 ppb   03/10/2024: Today - follow  up Discussed the use of AI scribe software for clinical note transcription with the patient, who gave verbal consent to proceed.  History of Present Illness  Codee Bloodworth Trevizo [name] is a 78 year old female with COPD who presents for follow-up of her respiratory status.  She  has been feeling gradually better since her recent pacemaker placement, acknowledging that it will take time to build her stamina back up, as she was unable to do much before the procedure.  Recent lung function testing showed improvement compared to a year ago, with a 6-8% increase in function. She remains in the moderate range for COPD.   She was switched from Breztri  to Stiolto due to thrush, and reports that it is going fine and feels it is helping her condition. She continues to experience some baseline shortness of breath but overall feeling better.   She is taking omeprazole  for reflux and reports that her cough has significantly improved. Occasionally has breakthrough symptoms but overall, reflux well managed. No difficulties swallowing or hoarse voice. She also uses nasal sprays and takes Claritin , which she feels are helping with her symptoms.   No wheezing, night sweats, hemoptysis, leg swelling, CP, palpitations, dizziness.     Allergies  Allergen Reactions   Codeine Nausea And Vomiting and Palpitations     Nausea and/or Vomiting, chest pain and acid reflux Feels like she is having a heart attack.     Gabapentin  Other (See Comments)    Eyes went numb and blurry  Blurred Vision as well   Gluten Meal Diarrhea and Other (See Comments)    Severe diarrhea per member    Iodinated Contrast Media Cough and Dermatitis    Immediately after contrast administration, patient experienced a scratchy throat and a diffuse rash along her arms and chest requiring benadryl  administration. Normal vitals.    Iodinated contrast media (substance)   Lisinopril Other (See Comments)   Dust Mite Extract     Upper Respiratory issues    Sulfa Antibiotics Other (See Comments)   Terbinafine Hcl Rash    Immunization History  Administered Date(s) Administered   INFLUENZA, HIGH DOSE SEASONAL PF 05/20/2006   Influenza Split 05/20/2006   Influenza-Unspecified 05/20/2006   PFIZER Comirnaty(Gray  Top)Covid-19 Tri-Sucrose Vaccine 08/15/2019, 09/05/2019, 07/20/2020   PFIZER(Purple Top)SARS-COV-2 Vaccination 08/15/2019, 07/20/2020   Pneumococcal Conjugate-13 05/31/2013, 05/31/2013   Pneumococcal Polysaccharide-23 05/08/2016   Td 03/10/2004   Td (Adult),5 Lf Tetanus Toxid, Preservative Free 03/10/2004   Tdap 03/10/2004, 04/13/2014, 04/13/2014    Past Medical History:  Diagnosis Date   A-fib (HCC)    Abnormal glucose 03/06/2020   Acute coronary syndrome (HCC) 02/09/2023   Age-related osteoporosis without current pathological fracture 11/22/2015   Overview:  Femoral neck  Formatting of this note might be different from the original. Femoral neck   Allergic rhinitis due to pollen 08/19/2015   Allergy    Aortic atherosclerosis 04/04/2021   Arthritis    Atrial fibrillation (HCC) 01/29/2024   Cardiac arrhythmia 01/11/2024   Carpal tunnel syndrome, bilateral 02/28/2002   Overview:  H/O CTS   Celiac disease    Cervical disc disorder 05/10/2009   Chronic cough 02/15/2006   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 11/26/2015   Formatting of this note might be different from the original. Added automatically from request for surgery [id number]   Chronic pain syndrome 04/01/2015   Colon polyps    COPD, moderate (HCC) 01/11/2024   DOE (dyspnea on exertion)    Dyspnea  Essential hypertension 07/20/2000   Overview:  HBP   Family history of adverse reaction to anesthesia    mother delerium   Female cystocele 02/27/2009   GAD (generalized anxiety disorder) 08/19/2015   GERD (gastroesophageal reflux disease)    Heart murmur    History of Bell's palsy 03/21/2015   Hx of adenomatous colonic polyps 12/20/2008   Overview:  Colonoscopy 07/2003 - [facility] - polyps removed - recommended repeat in 3 years Colonoscopy 09/2011 - Dr. Brenita - hyperplastic polyp(s) were removed - repeat in 5 years   Hypertension    Hypertrophic cardiomyopathy (HCC) 10/10/2023   Cardiomyopathy with severe  asymmetric septal hypertrophy on echocardiogram  Cardiac MRI April 2025: LVEF 45%, late gadolinium enhancement in noncoronary distribution, possible amyloidosis.  Cannot exclude hypertrophic cardiomyopathy     Incomplete uterovaginal prolapse 03/04/2004   Insomnia    Left bundle-branch block 05/20/2006   Left ventricular hypertrophy 05/23/2015   Lumbar radiculopathy 01/09/2016   Formatting of this note might be different from the original. Added automatically from request for surgery [id number]   LVH (left ventricular 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 05/23/2015   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   Mitral regurgitation 04/04/2021   Mixed dyslipidemia 04/04/2021   Non-rheumatic mitral regurgitation 03/21/2015   Osteopenia determined by x-ray 11/22/2015   Formatting of this note might be different from the original. Femoral neck   Other intervertebral disc degeneration, lumbar region 01/29/2015   Pre-operative cardiovascular examination 09/03/2020   Primary osteoarthritis, right hand 02/28/2002   Overview:  OA HANDS, ESP THUMBS   Radial tunnel syndrome, right 11/04/2016   Radiculopathy, lumbar region 11/22/2014   Overview:  Added automatically from request for surgery [id number]   Rhinitis, allergic 02/24/2002   Overview:  ALLERGY SYMPTOMS - states was seen by allergist in past and informed allergic to dust   S/P left total hip arthroplasty 09/17/2020   S/P total right hip arthroplasty 02/18/2023   Sacroiliitis 08/02/2019   Screening for colon cancer 04/17/2005   Formatting of this note might be different from the original. Colonoscopy 2005   Sensorineural hearing loss (SNHL) of both ears 07/05/2019   pt can hear normally   Short sleeper 04/29/2020   Snoring 04/29/2020   Spinal stenosis    Spinal stenosis, lumbar region without neurogenic claudication 11/22/2014   Tinnitus aurium, bilateral 07/05/2019   Trigger thumb, unspecified thumb 02/28/2002   Formatting of this note  might be different from the original. TRIGGER THUMBS - started spring 2003. Injected 02/28/02   Unspecified asthma, uncomplicated 12/25/2004    Tobacco History: Social History   Tobacco Use  Smoking Status Former   Current packs/day: 0.00   Average packs/day: 0.5 packs/day for 30.0 years (15.0 ttl pk-yrs)   Types: Cigarettes   Start date: 01/01/1968   Quit date: 12/31/1997   Years since quitting: 26.2  Smokeless Tobacco Never  Tobacco Comments   Quit in 1999   Counseling given: Not Answered Tobacco comments: Quit in 1999   Outpatient Medications Prior to Visit  Medication Sig Dispense Refill   albuterol  (VENTOLIN  HFA) 108 (90 Base) MCG/ACT inhaler Inhale 2 puffs into the lungs every 6 (six) hours as needed for wheezing or shortness of breath. 8 g 2   apixaban  (ELIQUIS ) 5 MG TABS tablet Take 1 tablet (5 mg total) by mouth 2 (two) times daily. Hold medication from 9/15 to 9/21 for right hip surgery 180 tablet 1   Ascorbic Acid  (VITAMIN C ) 1000 MG tablet Take 4,000 mg by  mouth daily.     Calcium -Magnesium  (CAL-MAG PO) Take 2,000 mg by mouth at bedtime. 1000 mg     Carboxymethylcellul-Glycerin  (CLEAR EYES FOR DRY EYES OP) Place 1 drop into both eyes daily.     empagliflozin  (JARDIANCE ) 10 MG TABS tablet Take 1 tablet (10 mg total) by mouth daily before breakfast. 90 tablet 3   fluorouracil (EFUDEX) 5 % cream Apply 1 Application topically 2 (two) times daily.     fluticasone (FLONASE) 50 MCG/ACT nasal spray Place 2 sprays into both nostrils daily as needed.     furosemide  (LASIX ) 20 MG tablet Take 1 tablet (20 mg total) by mouth daily. 90 tablet 3   hydrocortisone  2.5 % cream Apply topically as needed (hemmeroids).     loratadine  (CLARITIN ) 10 MG tablet Take 10 mg by mouth daily as needed for allergies.     losartan  (COZAAR ) 50 MG tablet TAKE 1 TABLET BY MOUTH EVERY DAY 90 tablet 1   metoprolol  succinate (TOPROL -XL) 100 MG 24 hr tablet Take 1 tablet (100 mg total) by mouth daily. Take with  or immediately following a meal. 90 tablet 3   milk thistle 175 MG tablet Take 175 mg by mouth daily.     nitroGLYCERIN  (NITROSTAT ) 0.4 MG SL tablet Place 1 tablet (0.4 mg total) under the tongue every 5 (five) minutes x 3 doses as needed for chest pain. 25 tablet 1   omeprazole  (PRILOSEC) 40 MG capsule Take 1 capsule (40 mg total) by mouth daily. 30 capsule 1   Probiotic Product (PROBIOTIC DAILY PO) Take 1 tablet by mouth daily.     rosuvastatin  (CRESTOR ) 20 MG tablet Take 1 tablet (20 mg total) by mouth daily. 30 tablet 6   spironolactone  (ALDACTONE ) 25 MG tablet Take 1 tablet (25 mg total) by mouth daily. 90 tablet 3   Tiotropium Bromide-Olodaterol (STIOLTO RESPIMAT ) 2.5-2.5 MCG/ACT AERS Inhale 2 puffs into the lungs daily. 4 g 5   traMADol  (ULTRAM ) 50 MG tablet Take 1 tablet (50 mg total) by mouth every 6 (six) hours for breakthrough pain. 20 tablet 1   triamcinolone cream (KENALOG) 0.1 % Apply topically as needed (rash).     Vitamin A  2400 MCG (8000 UT) CAPS Take 16,000 Units by mouth daily. 8000 units     Vitamin D, Ergocalciferol, (DRISDOL) 1.25 MG (50000 UNIT) CAPS capsule Take 50,000 Units by mouth once a week. Patient takes on fridays     zolpidem  (AMBIEN ) 5 MG tablet Take 5 mg by mouth at bedtime.     No facility-administered medications prior to visit.     Review of Systems: as above     Physical Exam:  BP 122/68 (BP Location: Right Arm, Patient Position: Sitting, Cuff Size: Normal)   Pulse 94   Temp 98.2 F (36.8 C) (Oral)   Ht 5' 2 (1.575 m)   Wt 163 lb 12.8 oz (74.3 kg)   SpO2 94% Comment: RA  BMI 29.96 kg/m   GEN: Pleasant, interactive, well-appearing; obese; in no acute distress HEENT:  Normocephalic and atraumatic. PERRLA. Sclera white. Nasal turbinates pale, moist and patent bilaterally. No rhinorrhea present. Oropharynx pink and moist, without exudate or edema. No lesions, ulcerations, or postnasal drip.  NECK:  Supple w/ fair ROM. No JVD present. No  lymphadenopathy.   CV: RRR, no m/r/g, no peripheral edema. Pulses intact, +2 bilaterally. No cyanosis, pallor or clubbing. PULMONARY:  Unlabored, regular breathing. Diminished bibasilar airflow otherwise clear bilaterally A&P w/o wheezes/rales/rhonchi. No accessory muscle use.  GI:  BS present and normoactive. Soft, non-tender to palpation.  MSK: No erythema, warmth or tenderness. Cap refil <2 sec all extrem.  Neuro: A/Ox3. No focal deficits noted.   Skin: Warm, no lesions or rashe Psych: Normal affect and behavior. Judgement and thought content appropriate.     Lab Results:  CBC    Component Value Date/Time   WBC 8.7 02/29/2024 1542   WBC 6.8 02/03/2024 0324   RBC 4.42 02/29/2024 1542   RBC 3.87 02/03/2024 0324   HGB 13.3 02/29/2024 1542   HCT 40.2 02/29/2024 1542   PLT 282 02/29/2024 1542   MCV 91 02/29/2024 1542   MCH 30.1 02/29/2024 1542   MCH 29.5 02/03/2024 0324   MCHC 33.1 02/29/2024 1542   MCHC 32.9 02/03/2024 0324   RDW 12.8 02/29/2024 1542   LYMPHSABS 1.0 01/29/2024 1637   MONOABS 0.9 01/29/2024 1637   EOSABS 0.2 01/29/2024 1637   BASOSABS 0.1 01/29/2024 1637    BMET    Component Value Date/Time   NA 136 02/29/2024 1542   K 4.8 02/29/2024 1542   CL 97 02/29/2024 1542   CO2 26 02/29/2024 1542   GLUCOSE 108 (H) 02/29/2024 1542   GLUCOSE 99 01/30/2024 0333   BUN 13 02/29/2024 1542   CREATININE 0.77 02/29/2024 1542   CREATININE 0.69 04/03/2021 0825   CALCIUM  10.1 02/29/2024 1542   GFRNONAA >60 01/30/2024 0333   GFRNONAA >60 04/03/2021 0825   GFRAA 104 03/06/2020 1706    BNP    Component Value Date/Time   BNP 116.4 (H) 01/29/2024 1637     Imaging:  CUP PACEART INCLINIC DEVICE CHECK Result Date: 02/17/2024 Normal dual chamber pacemaker wound check. Presenting rhythm: AS/VP 76. Wound well healed. Routine testing performed. Thresholds, sensing, and impedance consistent with implant measurements and at 3.5V safety margin/auto capture until 3 month visit.  No episodes. Reviewed arm restrictions to continue for 6 weeks total post op.  Pt enrolled in remote follow-up.Rozelle Banter, BSN, RN   Administration History     None          Latest Ref Rng & Units 03/07/2024    3:59 PM 02/09/2023    2:00 PM  PFT Results  FVC-Pre L 1.90  P 1.80   FVC-Predicted Pre % 74  P 69   FVC-Post L 1.88  P 1.83   FVC-Predicted Post % 74  P 70   Pre FEV1/FVC % % 72  P 70   Post FEV1/FCV % % 75  P 72   FEV1-Pre L 1.36  P 1.26   FEV1-Predicted Pre % 71  P 65   FEV1-Post L 1.40  P 1.32   DLCO uncorrected ml/min/mmHg 13.74  P 13.20   DLCO UNC% % 77  P 73   DLCO corrected ml/min/mmHg 13.78  P   DLCO COR %Predicted % 77  P   DLVA Predicted % 92  P 88   TLC L 5.54  P 4.82   TLC % Predicted % 116  P 101   RV % Predicted % 168  P 123     P Preliminary result    Lab Results  Component Value Date   NITRICOXIDE 17 01/11/2024        Assessment & Plan:   COPD, moderate (HCC) Moderate COPD with FEV1 71%. Progressive DOE seemed to be cardiac in nature and has slowly improved following pacemaker implant. Unable to tolerate ICS due to upper airway irritation and thrush. Doing well on Stiolto. Compensated on  current regimen. Cough substantially improved with GERD control. Graded exercises encouraged. Trigger prevention reviewed. Action plan in place.  Patient Instructions  Continue Stiolto 2 puffs daily. Brush tongue and rinse mouth afterwards. Brush tongue and rinse afterwards Continue Albuterol  inhaler 2 puffs every 6 hours as needed for shortness of breath or wheezing. Notify if symptoms persist despite rescue inhaler/neb use.  Continue claritin  daily Continue saline rinses 1-2 times a day, use bottled distilled water  Continue flonase 2 sprays each nostril daily; wait 30 minutes after the rinses to do the flonase Continue Omeprazole  40 mg daily - take in AM, 30 minutes prior to breakfast    Follow up with the heart doctor as scheduled  Follow up in 4  months with any new pulmonary doctor to establish care at [facility] office (does not live in [place] so needs to change providers). If symptoms do not improve or worsen, please contact office for sooner follow up or seek emergency care.   DOE (dyspnea on exertion) Worsening DOE over the last year. Recent stress test with normal EF. However, had a cardiac monitor that showed sustained runs of VT and episodes of complete heart block resulting in pacemaker implant. Clinically improving postop. See above  GERD (gastroesophageal reflux disease) Improved cough with initiation of PPI therapy for reflux. Reviewed risks of long term PPI use. Verbalized understanding. GERD precautions reviewed.   Rhinitis, allergic Continue allergy regimen    Advised if symptoms do not improve or worsen, to please contact office for sooner follow up or seek emergency care.   I spent 35 minutes of dedicated to the care of this patient on the date of this encounter to include pre-visit review of records, face-to-face time with the patient discussing conditions above, post visit ordering of testing, clinical documentation with the electronic health record, making appropriate referrals as documented, and communicating necessary findings to members of the patients care team.  Comer LULLA Rouleau, NP 03/10/2024  Pt aware and understands NP's role.

## 2024-03-13 NOTE — Progress Notes (Signed)
 Patient referred for Amyloid 02/14/24. Saw Dr Federico in 2022 but asked to be moved to Dr Onesimo for Amyloid. Appointment 03/14/24.

## 2024-03-14 ENCOUNTER — Inpatient Hospital Stay: Payer: MEDICARE | Attending: Hematology | Admitting: Hematology

## 2024-03-14 ENCOUNTER — Inpatient Hospital Stay: Payer: MEDICARE

## 2024-03-14 ENCOUNTER — Other Ambulatory Visit: Payer: Self-pay

## 2024-03-14 VITALS — BP 109/66 | HR 86 | Temp 97.5°F | Resp 20 | Wt 162.2 lb

## 2024-03-14 DIAGNOSIS — C8580 Other specified types of non-Hodgkin lymphoma, unspecified site: Secondary | ICD-10-CM

## 2024-03-14 DIAGNOSIS — I4891 Unspecified atrial fibrillation: Secondary | ICD-10-CM | POA: Diagnosis not present

## 2024-03-14 DIAGNOSIS — I119 Hypertensive heart disease without heart failure: Secondary | ICD-10-CM | POA: Diagnosis not present

## 2024-03-14 DIAGNOSIS — E854 Organ-limited amyloidosis: Secondary | ICD-10-CM | POA: Insufficient documentation

## 2024-03-14 DIAGNOSIS — M19041 Primary osteoarthritis, right hand: Secondary | ICD-10-CM | POA: Insufficient documentation

## 2024-03-14 DIAGNOSIS — Z7984 Long term (current) use of oral hypoglycemic drugs: Secondary | ICD-10-CM | POA: Diagnosis not present

## 2024-03-14 DIAGNOSIS — S82409A Unspecified fracture of shaft of unspecified fibula, initial encounter for closed fracture: Secondary | ICD-10-CM | POA: Diagnosis not present

## 2024-03-14 DIAGNOSIS — J4489 Other specified chronic obstructive pulmonary disease: Secondary | ICD-10-CM | POA: Diagnosis not present

## 2024-03-14 DIAGNOSIS — G894 Chronic pain syndrome: Secondary | ICD-10-CM | POA: Diagnosis not present

## 2024-03-14 DIAGNOSIS — Z860101 Personal history of adenomatous and serrated colon polyps: Secondary | ICD-10-CM | POA: Insufficient documentation

## 2024-03-14 DIAGNOSIS — Z79899 Other long term (current) drug therapy: Secondary | ICD-10-CM | POA: Insufficient documentation

## 2024-03-14 DIAGNOSIS — C8300 Small cell B-cell lymphoma, unspecified site: Secondary | ICD-10-CM | POA: Insufficient documentation

## 2024-03-14 DIAGNOSIS — Z7901 Long term (current) use of anticoagulants: Secondary | ICD-10-CM | POA: Insufficient documentation

## 2024-03-14 DIAGNOSIS — I43 Cardiomyopathy in diseases classified elsewhere: Secondary | ICD-10-CM | POA: Diagnosis not present

## 2024-03-14 DIAGNOSIS — M810 Age-related osteoporosis without current pathological fracture: Secondary | ICD-10-CM | POA: Insufficient documentation

## 2024-03-14 DIAGNOSIS — I080 Rheumatic disorders of both mitral and aortic valves: Secondary | ICD-10-CM | POA: Insufficient documentation

## 2024-03-14 DIAGNOSIS — I447 Left bundle-branch block, unspecified: Secondary | ICD-10-CM | POA: Insufficient documentation

## 2024-03-14 DIAGNOSIS — I2510 Atherosclerotic heart disease of native coronary artery without angina pectoris: Secondary | ICD-10-CM | POA: Diagnosis not present

## 2024-03-14 DIAGNOSIS — Z806 Family history of leukemia: Secondary | ICD-10-CM | POA: Insufficient documentation

## 2024-03-14 DIAGNOSIS — Z800 Family history of malignant neoplasm of digestive organs: Secondary | ICD-10-CM | POA: Diagnosis not present

## 2024-03-14 DIAGNOSIS — I472 Ventricular tachycardia, unspecified: Secondary | ICD-10-CM | POA: Insufficient documentation

## 2024-03-14 DIAGNOSIS — I700 Atherosclerosis of aorta: Secondary | ICD-10-CM | POA: Insufficient documentation

## 2024-03-14 NOTE — Progress Notes (Signed)
 HEMATOLOGY/ONCOLOGY CONSULTATION NOTE  Date of Service: 03/14/2024  Patient Care Team: Deanna Rojelio Caldron, NP as PCP - General (Nurse Practitioner) Deanna Montie CROME, RN as Oncology Nurse Navigator  CHIEF COMPLAINTS/PURPOSE OF CONSULTATION:  Deanna [name] Evaluation  HISTORY OF PRESENTING ILLNESS:   Deanna [name] is a wonderful 78 y.o. female who has been referred to us  by Deanna [name]? for evaluation and management of Deanna [name].   Last seen by Oncologist, Dr. Norleen [name] 04/03/2021:  Hematological/Oncological History # Marginal Zone Lymphoma, Stage III/IV 01/14/2021: presented to the ED with cervical lymphadenopathy 01/15/2021: establish care with Dr. Lanny at the [facility] 01/20/2021: US  guided lymph node biopsy, findings consistent with a marginal zone lymphoma 01/29/2021: transfer care to Dr. Kidney  02/07/2021: PET CT scan confirms nodal disease within the neck, chest, and pelvis. Most significant within the cervical regions. Deauville 5 03/28/2021: MRI showed fracture of the mid fibular shaft with callus formation and partial healing. Concern for possible pathological fracture.   Deanna [name] 78 y.o. female with medical history significant for marginal zone lymphoma who presents for an urgent follow up visit. The patient's last visit was on 03/12/2021. In the interim since the last visit she has sought a third opinion at [name] with Dr. Renold and unfortunately has developed a fracture of her right fibula.   On exam today Deanna [name] is accompanied by her daughter. Deanna [name] notes she unfortunately developed a fracture of her right fibula in the interim since her last visit.  She is deeply upset that she did not undergo a head to toe PET CT scan during her last visit as she received a skull base to mid thigh as a standard.  She notes that her pain is currently under good control and she is currently in a brace for her fracture.  She notes that she has been  taking tramadol  and Advil which has been helping with the pain.  She continues to deny any fevers, chills, sweats, nausea, vomiting or diarrhea.  A full 10 point ROS is listed below.  Lymph nodes in her neck and remained stable size.   The bulk of our discussion focused on the plan moving forward given this new fracture and the radiographic concern for possible pathologic fracture.  First seen by Dr. Norleen [name] on 01/29/2021: Deanna [name] 78 y.o. female with medical history significant for marginal zone lymphoma who presents for a follow up visit. The patient's last visit was on 01/15/2021 while inpatient with [facility]. In the interim since the last visit her pathology has returned with marginal zone lymphoma.   On exam today Deanna [name] is accompanied by her daughter. Deanna [name] notes that on Saturday, 01/11/2021 the patient noted a knot in her neck.  She notes that subsequently she felt 5-6 areas in her neck that were firm and rubbery.  She notes that she has not been experiencing any fevers, chills, sweats, nausea, vomiting or diarrhea.  She denies any weight loss.  She also notes that these lymph nodes are not painful.   On further discussion she notes that her family history is remarkable for leukemia in her father and sister.  Her mother had pancreatic cancer.  She has 2 healthy children and 3 healthy grandchildren.  She notes that she was a smoker but quit in October 1999.  She does not currently drink any alcohol .  She previously worked as an Environmental health practitioner.  She does endorse having occasional headaches but otherwise has had  no symptoms out of the ordinary.  A full 10 point ROS is listed below.   Has been followed by Dr. Renold for low-grade marginal zone lymphoma.  Seen by her cardiologist Dr. Alean Deanna [name] 07/2023. She was evaluated for due to shortness of breath and heart thickening noted on imagining, as there was concern for Cardiac Deanna [name].  She was seen by Dr.  Fernande at [facility] around the same time had an evaluation to rule out light chain Deanna [name]. She had a fat pad biopsy, which was non-diagnostic. Decided not to have genetic testing for ATTR Deanna [name] so her life insurance was not affected.  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  Patient notes that she follows with her other cardiologist Dr Deanna [name] manages Deanna [name].  Deanna [name] follows her for Afib, HTN , mitral valve regurgitation   She is on Jardiance , diuretics and notes that her breathing has improved. Breathing has improved Has not had any episodes of Afib.   She continues to follow with Dr. Orysia and has not had any symptomatic disease from her marginal zone lymphoma.  MEDICAL HISTORY:  Past Medical History:  Diagnosis Date   A-fib (HCC)    Abnormal glucose 03/06/2020   Acute coronary syndrome (HCC) 02/09/2023   Age-related osteoporosis without current pathological fracture 11/22/2015   Overview:  Femoral neck  Formatting of this note might be different from the original. Femoral neck   Allergic rhinitis due to pollen 08/19/2015   Allergy    Aortic atherosclerosis 04/04/2021   Arthritis    Atrial fibrillation (HCC) 01/29/2024   Cardiac arrhythmia 01/11/2024   Carpal tunnel syndrome, bilateral 02/28/2002   Overview:  H/O CTS   Celiac disease    Cervical disc disorder 05/10/2009   Chronic cough 02/15/2006   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 11/26/2015   Formatting of this note might be different from the original. Added automatically from request for surgery [id number]   Chronic pain syndrome 04/01/2015   Colon polyps    COPD, moderate (HCC) 01/11/2024   DOE (dyspnea on exertion)    Dyspnea    Essential hypertension 07/20/2000   Overview:  HBP   Family history of adverse reaction to anesthesia     mother delerium   Female cystocele 02/27/2009   GAD (generalized anxiety disorder) 08/19/2015   GERD (gastroesophageal reflux disease)    Heart murmur    History of Bell's palsy 03/21/2015   Hx of adenomatous colonic polyps 12/20/2008   Overview:  Colonoscopy 07/2003 - LMD - polyps removed - recommended repeat in 3 years Colonoscopy 09/2011 - Dr. Brenita - hyperplastic polyp(s) were removed - repeat in 5 years   Hypertension    Hypertrophic cardiomyopathy (HCC) 10/10/2023   Cardiomyopathy with severe asymmetric septal hypertrophy on echocardiogram  Cardiac MRI April 2025: LVEF 45%, late gadolinium enhancement in noncoronary distribution, possible Deanna [name].  Cannot exclude hypertrophic cardiomyopathy     Incomplete uterovaginal prolapse 03/04/2004   Insomnia    Left bundle-branch block 05/20/2006   Left ventricular hypertrophy 05/23/2015   Lumbar radiculopathy 01/09/2016   Formatting of this note might be different from the original. Added automatically from request for surgery [id number]   LVH (left ventricular 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 05/23/2015   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   Mitral regurgitation 04/04/2021   Mixed dyslipidemia 04/04/2021   Non-rheumatic mitral regurgitation 03/21/2015   Osteopenia determined  by x-ray 11/22/2015   Formatting of this note might be different from the original. Femoral neck   Other intervertebral disc degeneration, lumbar region 01/29/2015   Pre-operative cardiovascular examination 09/03/2020   Primary osteoarthritis, right hand 02/28/2002   Overview:  OA HANDS, ESP THUMBS   Radial tunnel syndrome, right 11/04/2016   Radiculopathy, lumbar region 11/22/2014   Overview:  Added automatically from request for surgery [id number]   Rhinitis, allergic 02/24/2002   Overview:  ALLERGY SYMPTOMS - states was seen by allergist in past and informed allergic to dust   S/P left total hip arthroplasty 09/17/2020   S/P total right hip arthroplasty  02/18/2023   Sacroiliitis 08/02/2019   Screening for colon cancer 04/17/2005   Formatting of this note might be different from the original. 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 TRIGGER THUMBS - started spring 2003. Injected 02/28/02   Unspecified asthma, uncomplicated 12/25/2004    SURGICAL HISTORY: Past Surgical History:  Procedure Laterality Date   ABDOMINAL HYSTERECTOMY  2010   APPENDECTOMY  1969   BIV PACEMAKER INSERTION CRT-P N/A 02/02/2024   Procedure: BIV PACEMAKER INSERTION CRT-P;  Surgeon: Waddell Danelle ORN, MD;  Location: [facility] INVASIVE CV LAB;  Service: Cardiovascular;  Laterality: N/A;   CHOLECYSTECTOMY  1969   SPINAL FUSION  01/2016   TONSILLECTOMY AND ADENOIDECTOMY  1956   TOTAL HIP ARTHROPLASTY Left 09/17/2020   Procedure: TOTAL HIP ARTHROPLASTY ANTERIOR APPROACH;  Surgeon: Ernie Cough, MD;  Location: [facility] ORS;  Service: Orthopedics;  Laterality: Left;  70 mins   TOTAL HIP ARTHROPLASTY Right 02/18/2023   Procedure: TOTAL HIP ARTHROPLASTY ANTERIOR APPROACH;  Surgeon: Ernie Cough, MD;  Location: [facility] OR[facility];  Service: Orthopedics;  Laterality: Right;    SOCIAL HISTORY: Social History   Socioeconomic History   Marital status: Divorced    Spouse name: Not on file   Number of children: 2   Years of education: 2 years college   Highest education level: Not on file  Occupational History   Occupation: Retired  Tobacco Use   Smoking status: Former    Current packs/day: 0.00    Average packs/day: 0.5 packs/day for 30.0 years (15.0 ttl pk-yrs)    Types: Cigarettes    Start date: 01/01/1968    Quit date: 12/31/1997    Years since  quitting: 26.2   Smokeless tobacco: Never   Tobacco comments:    Quit in 1999  Vaping Use   Vaping status: Never Used  Substance and Sexual Activity   Alcohol  use: Not Currently   Drug use: No   Sexual activity: Not Currently    Birth control/protection: Post-menopausal  Other Topics Concern   Not on file  Social History Narrative   Lives alone.   Right-handed.   One cup caffeine per day.   Social Drivers of Health   Financial Resource Strain: High Risk (01/14/2022)   Overall Financial Resource Strain (CARDIA)    Difficulty of Paying Living Expenses: Hard  Food Insecurity: Patient Declined (01/30/2024)   Hunger Vital Sign    Worried About Running Out of Food in the Last Year: Patient declined    Barista  in the Last Year: Patient declined  Transportation Needs: Patient Declined (01/30/2024)   PRAPARE - Administrator, Civil Service (Medical): Patient declined    Lack of Transportation (Non-Medical): Patient declined  Physical Activity: Inactive (01/14/2022)   Exercise Vital Sign    Days of Exercise per Week: 0 days    Minutes of Exercise per Session: 0 min  Stress: Stress Concern Present (01/14/2022)   [employer] of Occupational Health - Occupational Stress Questionnaire    Feeling of Stress : To some extent  Social Connections: Patient Declined (01/30/2024)   Social Connection and Isolation Panel    Frequency of Communication with Friends and Family: Patient declined    Frequency of Social Gatherings with Friends and Family: Patient declined    Attends Religious Services: Patient declined    Database administrator or Organizations: Patient declined    Attends Banker Meetings: Patient declined    Marital Status: Patient declined  Intimate Partner Violence: Patient Declined (01/30/2024)   Humiliation, Afraid, Rape, and Kick questionnaire    Fear of Current or Ex-Partner: Patient declined    Emotionally Abused: Patient declined     Physically Abused: Patient declined    Sexually Abused: Patient declined   Social History   Social History Narrative   Lives alone.   Right-handed.   One cup caffeine per day.    FAMILY HISTORY: Family History  Problem Relation Age of Onset   Arthritis Mother    Diabetes Mother    Pancreatic cancer Mother 70   Early death Father    Leukemia Father    Leukemia Sister    Early death Sister    Diabetes Sister     ALLERGIES:  is allergic to codeine, gabapentin , gluten meal, iodinated contrast media, lisinopril, dust mite extract, sulfa antibiotics, and terbinafine hcl.  MEDICATIONS:  Current Outpatient Medications  Medication Sig Dispense Refill   albuterol  (VENTOLIN  HFA) 108 (90 Base) MCG/ACT inhaler Inhale 2 puffs into the lungs every 6 (six) hours as needed for wheezing or shortness of breath. 8 g 2   apixaban  (ELIQUIS ) 5 MG TABS tablet Take 1 tablet (5 mg total) by mouth 2 (two) times daily. Hold medication from 9/15 to 9/21 for right hip surgery 180 tablet 1   Ascorbic Acid  (VITAMIN C ) 1000 MG tablet Take 4,000 mg by mouth daily.     Calcium -Magnesium  (CAL-MAG PO) Take 2,000 mg by mouth at bedtime. 1000 mg     Carboxymethylcellul-Glycerin  (CLEAR EYES FOR DRY EYES OP) Place 1 drop into both eyes daily.     empagliflozin  (JARDIANCE ) 10 MG TABS tablet Take 1 tablet (10 mg total) by mouth daily before breakfast. 90 tablet 3   fluorouracil (EFUDEX) 5 % cream Apply 1 Application topically 2 (two) times daily.     fluticasone (FLONASE) 50 MCG/ACT nasal spray Place 2 sprays into both nostrils daily as needed.     furosemide  (LASIX ) 20 MG tablet Take 1 tablet (20 mg total) by mouth daily. 90 tablet 3   hydrocortisone  2.5 % cream Apply topically as needed (hemmeroids).     loratadine  (CLARITIN ) 10 MG tablet Take 10 mg by mouth daily as needed for allergies.     losartan  (COZAAR ) 50 MG tablet TAKE 1 TABLET BY MOUTH EVERY DAY 90 tablet 1   metoprolol  succinate (TOPROL -XL) 100 MG 24 hr  tablet Take 1 tablet (100 mg total) by mouth daily. Take with or immediately following a meal. 90 tablet 3  milk thistle 175 MG tablet Take 175 mg by mouth daily.     nitroGLYCERIN  (NITROSTAT ) 0.4 MG SL tablet Place 1 tablet (0.4 mg total) under the tongue every 5 (five) minutes x 3 doses as needed for chest pain. 25 tablet 1   omeprazole  (PRILOSEC) 40 MG capsule Take 1 capsule (40 mg total) by mouth daily. 30 capsule 1   Probiotic Product (PROBIOTIC DAILY PO) Take 1 tablet by mouth daily.     rosuvastatin  (CRESTOR ) 20 MG tablet Take 1 tablet (20 mg total) by mouth daily. 30 tablet 6   spironolactone  (ALDACTONE ) 25 MG tablet Take 1 tablet (25 mg total) by mouth daily. 90 tablet 3   Tiotropium Bromide-Olodaterol (STIOLTO RESPIMAT ) 2.5-2.5 MCG/ACT AERS Inhale 2 puffs into the lungs daily. 4 g 5   traMADol  (ULTRAM ) 50 MG tablet Take 1 tablet (50 mg total) by mouth every 6 (six) hours for breakthrough pain. 20 tablet 1   triamcinolone cream (KENALOG) 0.1 % Apply topically as needed (rash).     Vitamin A  2400 MCG (8000 UT) CAPS Take 16,000 Units by mouth daily. 8000 units     Vitamin D, Ergocalciferol, (DRISDOL) 1.25 MG (50000 UNIT) CAPS capsule Take 50,000 Units by mouth once a week. Patient takes on fridays     zolpidem  (AMBIEN ) 5 MG tablet Take 5 mg by mouth at bedtime.     No current facility-administered medications for this visit.    REVIEW OF SYSTEMS:    .10 Point review of Systems was done is negative except as noted above.   PHYSICAL EXAMINATION: ECOG PERFORMANCE STATUS: 1 - Symptomatic but completely ambulatory  Vitals:   03/14/24 1108  BP: 109/66  Pulse: 86  Resp: 20  Temp: (!) 97.5 F (36.4 C)  SpO2: 95%   Filed Weights   03/14/24 1108  Weight: 162 lb 3.2 oz (73.6 kg)   Body mass index is 29.67 kg/m.  GENERAL:alert, in no acute distress and comfortable SKIN: no acute rashes, no significant lesions EYES: conjunctiva are pink and non-injected, sclera  anicteric OROPHARYNX: MMM, no exudates, no oropharyngeal erythema or ulceration NECK: supple, no JVD LYMPH:  no palpable lymphadenopathy in the cervical, axillary or inguinal regions LUNGS: clear to auscultation b/l with normal respiratory effort HEART: regular rate & rhythm ABDOMEN:  normoactive bowel sounds , non tender, not distended.  No palpable hepatosplenomegaly Extremity: no pedal edema PSYCH: alert & oriented x 3 with fluent speech NEURO: no focal motor/sensory deficits  LABORATORY DATA:  I have reviewed the data as listed     Latest Ref Rng & Units 02/29/2024    3:42 PM 02/03/2024    3:24 AM 02/02/2024    3:31 AM  CBC  WBC 3.4 - 10.8 x10E3/uL 8.7  6.8  6.1   Hemoglobin 11.1 - 15.9 g/dL 86.6  88.5  88.0   Hematocrit 34.0 - 46.6 % 40.2  34.7  35.4   Platelets 150 - 450 x10E3/uL 282  230  238    No lumps bumps fever chills night sweats  No new bone pains        Latest Ref Rng & Units 02/29/2024    3:42 PM 01/30/2024    3:33 AM 01/29/2024    4:46 PM  CMP  Glucose 70 - 99 mg/dL 891  99  888   BUN 8 - 27 mg/dL 13  7  6    Creatinine 0.57 - 1.00 mg/dL 9.22  9.34  9.39   Sodium 134 - 144 mmol/L  136  134  137   Potassium 3.5 - 5.2 mmol/L 4.8  4.8  2.7   Chloride 96 - 106 mmol/L 97  100  98   CO2 20 - 29 mmol/L 26  21    Calcium  8.7 - 10.3 mg/dL 89.8  9.5          RADIOGRAPHIC STUDIES: I have personally reviewed the radiological images as listed and agreed with the findings in the report. CUP PACEART INCLINIC DEVICE CHECK Result Date: 02/17/2024 Normal dual chamber pacemaker wound check. Presenting rhythm: AS/VP 76. Wound well healed. Routine testing performed. Thresholds, sensing, and impedance consistent with implant measurements and at 3.5V safety margin/auto capture until 3 month visit. No episodes. Reviewed arm restrictions to continue for 6 weeks total post op.  Pt enrolled in remote follow-up.Rozelle Banter, BSN, RN  DG Chest 2 View Result Date:  02/03/2024 CLINICAL DATA:  Status post pacemaker placement.  Atelectasis. EXAM: CHEST - 2 VIEW COMPARISON:  01/29/2024 FINDINGS: The cardio pericardial silhouette is enlarged. The lungs are clear without focal pneumonia, edema, pneumothorax or pleural effusion. Dual lead left-sided permanent pacemaker is new in the interval. Telemetry leads overlie the chest. IMPRESSION: Interval placement of dual lead left-sided permanent pacemaker. No pneumothorax or pleural effusion. Electronically Signed   By: Camellia Candle M.D.   On: 02/03/2024 08:42   EP PPM/ICD IMPLANT Result Date: 02/03/2024 CONCLUSIONS:  1. Successful implantation of a Medtronic dual-chamber pacemaker for symptomatic bradycardia due to intermittent CHB in the setting of chronic LBBB and symptomatic tachy-brady syndrome.  2. No early apparent complications.       Danelle Birmingham, MD 02/02/2024 5:51 PM   DG Chest Port 1 View Result Date: 01/29/2024 CLINICAL DATA:  Shortness of breath. EXAM: PORTABLE CHEST 1 VIEW COMPARISON:  01/11/2024. FINDINGS: The heart is enlarged and the mediastinal contour is within normal limits. There is atherosclerotic calcification of the aorta. Interstitial prominence and scattered atelectasis is noted at the lung bases. No effusion or pneumothorax is seen. No acute osseous abnormality. IMPRESSION: Mild interstitial prominence at the lung bases with subsegmental atelectasis. The possibility of edema are superimposed infection cannot be excluded. Electronically Signed   By: Leita Birmingham M.D.   On: 01/29/2024 18:05   MYOCARDIAL AMYLOID/CT RAD READ Result Date: 01/22/2024 CLINICAL DATA:  This over-read does not include interpretation of cardiac or coronary anatomy or pathology. The cardiac SPECT CT interpretation by the cardiologist is attached. COMPARISON:  CT chest December 12, 2022 FINDINGS: Vascular: Aortic atherosclerosis.  Coronary artery calcifications. Mediastinum/Nodes: No pathologically enlarged mediastinal lymph nodes.  Lungs/Pleura: Motion degraded examination of the lungs. Upper Abdomen: Nonobstructive left renal stone. Musculoskeletal: Spondylosis. IMPRESSION: 1. No acute abnormality in the visualized extracardiac structures. 2. Nonobstructive left renal stone. 3. Aortic atherosclerosis. Aortic Atherosclerosis (ICD10-I70.0). Electronically Signed   By: Reyes Holder M.D.   On: 01/22/2024 15:49   PYP Scan Result Date: 01/20/2024   Findings are suggestive of cardiac ATTR Deanna [name]. The myocardium was positive for radiotracer uptake.   The visual grade of myocardial uptake relative to the ribs was Grade 3 (Myocardial uptake greater than rib uptake with mild/absent rib uptake).   CT images were obtained for attenuation correction and were examined for the presence of coronary calcium  when appropriate.   Coronary calcium  was present on the attenuation correction CT images. Moderate coronary calcifications were present. Coronary calcifications were present in the right coronary artery distribution(s). Aortic atherosclerosis noted.  Aortic Valve calcification noted. 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 The myocardium was positive for radiotracer uptake.  IFE urine and serum testing recommend to rule out AL-CA.   LONG TERM MONITOR (3-14 DAYS) Result Date: 01/16/2024   Patient had a minimum heart rate of 28 bpm, maximum heart rate of 164 bpm, and average heart rate of 78 bpm. Predominant underlying rhythm was sinus rhythm. Ventricular Tachycardia lasting 12 seconds. No atrial fibrillation or flutter. Complete heart block noted. Symptomatic high grade AV block noted. Patient notified of test; seen by EP.  DG Chest 2 View Result Date: 01/11/2024 CLINICAL DATA:  Shortness of breath EXAM: CHEST - 2 VIEW COMPARISON:  06/24/2023 FINDINGS: Stable cardiomediastinal silhouette. Aortic atherosclerotic calcification. No focal consolidation, pleural effusion, or pneumothorax. No displaced rib fractures. IMPRESSION: No  active cardiopulmonary disease. Electronically Signed   By: Norman Gatlin M.D.   On: 01/11/2024 12:22   ECHOCARDIOGRAM STRESS TEST Result Date: 12/30/2023     EXERCISE STRESS ECHO REPORT    -------------------------------------------------------------------------------- Patient Name:   Pearline Yerby Gongora Date of Exam: 12/30/2023 Medical Rec #:  [id number]       Height:       62.0 in Accession #:    [id number]      Weight:       166.0 lb Date of Birth:  08/29/1945       BSA:          1.766 m Patient Age:    77 years        BP:           140/80 mmHg Patient Gender: F               HR:           67 bpm. Exam Location:  [address] Procedure: Limited Echo, Stress Echo, Cardiac Doppler and Limited Color Doppler Indications:    I42.2 HOCM  History:        Patient has prior history of Echocardiogram examinations, most                 recent 02/02/2023.  Sonographer:    Heather Hawks RDCS Referring Phys: [facility] LABOR LEAVENS  Sonographer Comments: * IMPRESSIONS  1. The findings of the study demonstrate that no stress-induced outflow tract obstruction is present. Conclusion(s)/Recommendation(s): No significant increase in LVOTO gradient with squat or treadmill.  FINDINGS Exam Protocol: The patient exercised on a treadmill according to a Modified Bruce protocol.  Patient Performance: The patient exercised for 3 minutes. The maximum stage achieved was I of the Modified Bruce protocol. The baseline heart rate was 77 bpm. The heart rate at peak stress was 98 bpm. The target heart rate was calculated to be 122 bpm. The percentage of maximum predicted heart rate achieved was 68.4 %. The baseline blood pressure was 140/80 mmHg. The blood pressure at peak stress was 155/77 mmHg. The patient developed shortness of breath, fatigue, leg fatigue and dizziness during the stress exam. The symptoms resolved with rest.  EKG: Resting EKG showed normal sinus rhythm with left bundle branch block. The patient developed no abnormal EKG  findings during exercise.  2D Echo Findings: The baseline ejection fraction was 75%. The peak ejection fraction at stress was 80%. Baseline regional wall motion abnormalities were not present.  Stress Doppler:  LVOT: The baseline LVOT gradient was 15 mmHG. The findings of the study demonstrate that no stress-induced outflow tract obstruction is present.  Mitral Regurgitation: At baseline, mild mitral regurgitation was present.  Morene Brownie Electronically signed on 12/30/2023  at 4:47:47 PM    Final    ASSESSMENT & PLAN:    78 y.o. female with  #1 history of nodal marginal zone lymphoma Rituxan Weekly x 4 completed 05/27/2021, progressive disease noted and went on to complete 3 cycles of BR, last dose December 2023.  Plan - Patient is doing well from a lymphoma standpoint. Last imaging study from a surveillance standpoint was done with Dr. Purcell on 04/14/2023 which showed no evidence of recurrent/persistent/progressive disease. - She is currently asymptomatic from this and has normal blood counts. 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 - She will continue follow-up with Dr. Renold at [facility].  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 Patient was seen by Dr. Fernande at [facility].  Fat pad biopsy done was indeterminant. MRI of the heart on 09/29/2023 showed normal left ventricular size with severe asymmetric hypertrophy of the basal septum and basal inferior wall.  Mild hypokinesis EF 46%.. Noncoronary LGE pattern with confluent basal to mid  anterior/anterolateral/inferolateral/inferior subendocardial LGE and  mid-wall LGE throughout the anteroseptum and inferoseptum.  4. Elevated extracellular volume percentage at 37%.   Patient had an abdominal fat pad biopsy on 10/28/2023 which was nondiagnostic.  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 The myocardium was positive for radiotracer uptake.   The visual grade of myocardial uptake relative to the ribs was Grade 3 (Myocardial uptake greater than rib uptake with mild/absent rib uptake).   CT images were obtained for attenuation correction and were examined for the presence of coronary calcium  when appropriate.   Coronary calcium  was present on the attenuation correction CT images. Moderate coronary calcifications were present. Coronary calcifications were present in the right coronary artery distribution(s). Aortic atherosclerosis noted.  Aortic Valve calcification noted. 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 The myocardium was positive for radiotracer uptake.  IFE urine and serum testing recommend to rule out AL-CA.    PLAN:  -Discussed all available lab results and imaging studies and serologies with the patient in detail. Patient has a small amount of monoclonal IgG kappa monoclonal protein but normal serum kappa lambda free light chains She had evaluation in the context of cardiac Deanna [name] concern. 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 No clear indication for bone marrow biopsy at this time due to low likelihood of this being light chain Deanna [name]. No other workup recommended at this time from a hematology standpoint. Continue follow-up with Dr. Kimberly Skiff and Dr. Arnetha for management of cardiac Deanna [name]. We discussed that ATTR amyloid is either age-related commonly in males or can be hereditary.  At this point patient does not want to have any genetic testing that might affect her life insurance policy of  the policy of her children or grandchildren. - She will continue follow-up with cardiology to manage and evaluate this further. Continue follow-up with Dr. Justus for management of marginal zone lymphoma.  FOLLOW-UP in  - Return to care with Dr. Caroll for continued evaluation and management of marginal zone  lymphoma. Follow-up with cardiology for management of ATTR cardiac Deanna [name]  .The total time spent in the appointment was 63 minutes* .  All of the patient's questions were answered with apparent satisfaction. The patient knows to call the clinic with any problems, questions or concerns.   Emaline Saran MD MS AAHIVMS [facility] Hematology/Oncology Physician [facility]  .*Total Encounter Time as defined by the Centers for Medicare and Medicaid Services includes, in addition to the face-to-face time of a patient visit (documented in the note above) non-face-to-face time: obtaining and reviewing outside history, ordering and reviewing medications, tests or procedures, care coordination (communications with other health care professionals or caregivers) and documentation in the medical record.  I,Emily Lagle,acting as a Neurosurgeon for Emaline Saran, MD.,have documented all relevant documentation on the behalf of Emaline Saran, MD,as directed by  Emaline Saran, MD while in the presence of Emaline Saran, MD.  I have reviewed the above documentation for accuracy and completeness, and I agree with the above.  Jaemarie Hochberg, MD

## 2024-03-16 ENCOUNTER — Ambulatory Visit (INDEPENDENT_AMBULATORY_CARE_PROVIDER_SITE_OTHER): Payer: MEDICARE

## 2024-03-16 DIAGNOSIS — R001 Bradycardia, unspecified: Secondary | ICD-10-CM | POA: Diagnosis not present

## 2024-03-17 ENCOUNTER — Telehealth: Payer: Self-pay

## 2024-03-17 ENCOUNTER — Other Ambulatory Visit (HOSPITAL_BASED_OUTPATIENT_CLINIC_OR_DEPARTMENT_OTHER): Payer: Self-pay

## 2024-03-17 MED ORDER — ALBUTEROL SULFATE HFA 108 (90 BASE) MCG/ACT IN AERS
1.0000 | INHALATION_SPRAY | Freq: Four times a day (QID) | RESPIRATORY_TRACT | 11 refills | Status: DC | PRN
  Filled 2024-03-17: qty 6.7, 25d supply, fill #0

## 2024-03-17 MED ORDER — METOPROLOL SUCCINATE ER 50 MG PO TB24
50.0000 mg | ORAL_TABLET | Freq: Every day | ORAL | 2 refills | Status: DC
  Filled 2024-03-17: qty 90, 90d supply, fill #0

## 2024-03-17 MED ORDER — ZOLPIDEM TARTRATE 5 MG PO TABS
5.0000 mg | ORAL_TABLET | Freq: Every evening | ORAL | 2 refills | Status: DC | PRN
  Filled 2024-03-17: qty 30, 30d supply, fill #0

## 2024-03-17 MED ORDER — NYSTATIN 100000 UNIT/ML MT SUSP: 5.0000 mL | Freq: Four times a day (QID) | OROMUCOSAL | 0 refills | Status: DC

## 2024-03-17 MED ORDER — BUDESON-GLYCOPYRROL-FORMOTEROL 160-9-4.8 MCG/ACT IN AERO
2.0000 | INHALATION_SPRAY | Freq: Two times a day (BID) | RESPIRATORY_TRACT | 11 refills | Status: DC
  Filled 2024-03-17: qty 10.7, 30d supply, fill #0

## 2024-03-17 MED ORDER — OMEPRAZOLE 40 MG PO CPDR
40.0000 mg | DELAYED_RELEASE_CAPSULE | Freq: Every day | ORAL | 1 refills | Status: DC
  Filled 2024-03-17: qty 30, 30d supply, fill #0

## 2024-03-17 MED ORDER — ERGOCALCIFEROL 1.25 MG (50000 UT) PO CAPS
50000.0000 [IU] | ORAL_CAPSULE | ORAL | 3 refills | Status: AC
  Filled 2024-03-17 – 2024-05-10 (×2): qty 12, 84d supply, fill #0

## 2024-03-17 MED ORDER — HYDROCORTISONE 2.5 % EX CREA
TOPICAL_CREAM | Freq: Two times a day (BID) | CUTANEOUS | 3 refills | Status: AC | PRN
  Filled 2024-03-17: qty 30, 15d supply, fill #0
  Filled 2024-06-16: qty 60, 30d supply, fill #0

## 2024-03-17 NOTE — Telephone Encounter (Signed)
 [facility] Clinical Social Work  Clinical Social Work was referred by Engineer, civil (consulting) for [employer] needs.  Clinical Social Worker contacted patient by phone to offer support and assess for needs.  CSW discussed purpose for call. Patient discussed options available for SDOH. Patient has explored all resources known and is not eligible for assistance. CSW informed patient of Radiographer, therapeutic and encouraged her to check resources at [facility] where her oncologist is stationed. No other needs at this time,       Lizbeth Sprague, LCSW  Clinical Social Worker [facility]

## 2024-03-19 LAB — CUP PACEART REMOTE DEVICE CHECK
Battery Remaining Longevity: 136 mo
Battery Voltage: 3.2 V
Brady Statistic AP VP Percent: 9.47 %
Brady Statistic AP VS Percent: 0.01 %
Brady Statistic AS VP Percent: 90.3 %
Brady Statistic AS VS Percent: 0.22 %
Brady Statistic RA Percent Paced: 9.51 %
Brady Statistic RV Percent Paced: 99.78 %
Date Time Interrogation Session: 20251016064007
Implantable Lead Connection Status: 753985
Implantable Lead Connection Status: 753985
Implantable Lead Implant Date: 20250903
Implantable Lead Implant Date: 20250903
Implantable Lead Location: 753859
Implantable Lead Location: 753860
Implantable Lead Model: 3830
Implantable Lead Model: 5076
Implantable Pulse Generator Implant Date: 20250903
Lead Channel Impedance Value: 361 Ohm
Lead Channel Impedance Value: 380 Ohm
Lead Channel Impedance Value: 532 Ohm
Lead Channel Impedance Value: 646 Ohm
Lead Channel Pacing Threshold Amplitude: 0.75 V
Lead Channel Pacing Threshold Amplitude: 0.75 V
Lead Channel Pacing Threshold Pulse Width: 0.4 ms
Lead Channel Pacing Threshold Pulse Width: 0.4 ms
Lead Channel Sensing Intrinsic Amplitude: 23.125 mV
Lead Channel Sensing Intrinsic Amplitude: 4.625 mV
Lead Channel Sensing Intrinsic Amplitude: 4.625 mV
Lead Channel Setting Pacing Amplitude: 3.5 V
Lead Channel Setting Pacing Amplitude: 3.5 V
Lead Channel Setting Pacing Pulse Width: 0.4 ms
Lead Channel Setting Sensing Sensitivity: 1.2 mV
Zone Setting Status: 755011
Zone Setting Status: 755011

## 2024-03-20 ENCOUNTER — Other Ambulatory Visit (HOSPITAL_BASED_OUTPATIENT_CLINIC_OR_DEPARTMENT_OTHER): Payer: Self-pay

## 2024-03-20 ENCOUNTER — Other Ambulatory Visit: Payer: Self-pay

## 2024-03-20 MED ORDER — LOSARTAN POTASSIUM 50 MG PO TABS
50.0000 mg | ORAL_TABLET | Freq: Every morning | ORAL | 2 refills | Status: DC
  Filled 2024-03-20: qty 90, 90d supply, fill #0

## 2024-03-20 MED ORDER — EMPAGLIFLOZIN 10 MG PO TABS
10.0000 mg | ORAL_TABLET | Freq: Every day | ORAL | 3 refills | Status: DC
  Filled 2024-03-20: qty 30, 30d supply, fill #0
  Filled 2024-04-14: qty 30, 30d supply, fill #1

## 2024-03-20 MED ORDER — FLUTICASONE PROPIONATE 50 MCG/ACT NA SUSP
1.0000 | Freq: Every day | NASAL | 4 refills | Status: DC | PRN
  Filled 2024-03-20: qty 48, 30d supply, fill #0

## 2024-03-20 MED ORDER — FUROSEMIDE 20 MG PO TABS
20.0000 mg | ORAL_TABLET | Freq: Every day | ORAL | 3 refills | Status: DC
  Filled 2024-03-20: qty 90, 90d supply, fill #0

## 2024-03-20 MED ORDER — ALBUTEROL SULFATE HFA 108 (90 BASE) MCG/ACT IN AERS
2.0000 | INHALATION_SPRAY | Freq: Four times a day (QID) | RESPIRATORY_TRACT | 3 refills | Status: AC | PRN
  Filled 2024-03-20: qty 6.7, 30d supply, fill #0

## 2024-03-20 MED ORDER — SPIRONOLACTONE 25 MG PO TABS
25.0000 mg | ORAL_TABLET | Freq: Every day | ORAL | 3 refills | Status: DC
  Filled 2024-03-20: qty 90, 90d supply, fill #0

## 2024-03-20 MED ORDER — TRAMADOL HCL 50 MG PO TABS
50.0000 mg | ORAL_TABLET | Freq: Two times a day (BID) | ORAL | 2 refills | Status: AC
  Filled 2024-03-30: qty 60, 30d supply, fill #0

## 2024-03-20 MED ORDER — ROSUVASTATIN CALCIUM 10 MG PO TABS
10.0000 mg | ORAL_TABLET | Freq: Every morning | ORAL | 3 refills | Status: DC
  Filled 2024-03-20 – 2024-03-24 (×2): qty 90, 90d supply, fill #0

## 2024-03-20 NOTE — Progress Notes (Signed)
 Appointment with Dr Onesimo 03/14/24, patient is to continue to FU with Dr Renold and not recommendation for BMBX or additional testing. Will not follow as navigator.

## 2024-03-21 ENCOUNTER — Ambulatory Visit: Payer: MEDICARE

## 2024-03-22 ENCOUNTER — Ambulatory Visit: Payer: Self-pay | Admitting: Internal Medicine

## 2024-03-23 NOTE — Progress Notes (Signed)
 Remote PPM Transmission

## 2024-03-24 ENCOUNTER — Other Ambulatory Visit (HOSPITAL_BASED_OUTPATIENT_CLINIC_OR_DEPARTMENT_OTHER): Payer: Self-pay

## 2024-03-25 ENCOUNTER — Other Ambulatory Visit (HOSPITAL_BASED_OUTPATIENT_CLINIC_OR_DEPARTMENT_OTHER): Payer: Self-pay

## 2024-03-30 ENCOUNTER — Other Ambulatory Visit (HOSPITAL_BASED_OUTPATIENT_CLINIC_OR_DEPARTMENT_OTHER): Payer: Self-pay

## 2024-03-31 ENCOUNTER — Other Ambulatory Visit (HOSPITAL_BASED_OUTPATIENT_CLINIC_OR_DEPARTMENT_OTHER): Payer: Self-pay

## 2024-04-11 ENCOUNTER — Other Ambulatory Visit (HOSPITAL_BASED_OUTPATIENT_CLINIC_OR_DEPARTMENT_OTHER): Payer: Self-pay

## 2024-04-11 ENCOUNTER — Other Ambulatory Visit: Payer: Self-pay

## 2024-04-11 DIAGNOSIS — I480 Paroxysmal atrial fibrillation: Secondary | ICD-10-CM

## 2024-04-11 MED ORDER — APIXABAN 5 MG PO TABS
5.0000 mg | ORAL_TABLET | Freq: Two times a day (BID) | ORAL | 2 refills | Status: DC
Start: 2024-04-11 — End: 2024-04-19
  Filled 2024-04-11: qty 180, 90d supply, fill #0

## 2024-04-11 NOTE — Telephone Encounter (Signed)
 Eliquis  5mg  refill request received. Patient is 78 years old, weight-73.6kg, Crea-0.77 on 02/29/24, Diagnosis-Afib, and last seen by Dr. Liborio on 02/18/24. Dose is appropriate based on dosing criteria. Will send in refill to requested pharmacy.

## 2024-04-13 ENCOUNTER — Other Ambulatory Visit: Payer: Self-pay

## 2024-04-18 ENCOUNTER — Other Ambulatory Visit: Payer: Self-pay

## 2024-04-19 ENCOUNTER — Other Ambulatory Visit (HOSPITAL_BASED_OUTPATIENT_CLINIC_OR_DEPARTMENT_OTHER): Payer: Self-pay

## 2024-04-19 ENCOUNTER — Other Ambulatory Visit: Payer: Self-pay

## 2024-04-19 ENCOUNTER — Ambulatory Visit: Payer: MEDICARE

## 2024-04-19 VITALS — BP 130/68 | HR 70 | Ht 62.0 in | Wt 161.4 lb

## 2024-04-19 DIAGNOSIS — E8582 Wild-type transthyretin-related (ATTR) amyloidosis: Secondary | ICD-10-CM | POA: Diagnosis present

## 2024-04-19 DIAGNOSIS — I5042 Chronic combined systolic (congestive) and diastolic (congestive) heart failure: Secondary | ICD-10-CM | POA: Diagnosis present

## 2024-04-19 DIAGNOSIS — I480 Paroxysmal atrial fibrillation: Secondary | ICD-10-CM | POA: Insufficient documentation

## 2024-04-19 MED ORDER — SPIRONOLACTONE 25 MG PO TABS
25.0000 mg | ORAL_TABLET | Freq: Every day | ORAL | 3 refills | Status: AC
  Filled 2024-04-19 – 2024-05-11 (×2): qty 90, 90d supply, fill #0

## 2024-04-19 MED ORDER — ROSUVASTATIN CALCIUM 20 MG PO TABS
20.0000 mg | ORAL_TABLET | Freq: Every day | ORAL | 3 refills | Status: AC
  Filled 2024-04-19: qty 90, 90d supply, fill #0

## 2024-04-19 MED ORDER — METOPROLOL SUCCINATE ER 100 MG PO TB24
100.0000 mg | ORAL_TABLET | Freq: Every day | ORAL | 3 refills | Status: AC
  Filled 2024-04-19 – 2024-05-10 (×2): qty 90, 90d supply, fill #0

## 2024-04-19 MED ORDER — FUROSEMIDE 20 MG PO TABS
20.0000 mg | ORAL_TABLET | Freq: Every day | ORAL | 3 refills | Status: AC
  Filled 2024-04-19 – 2024-05-11 (×2): qty 90, 90d supply, fill #0

## 2024-04-19 MED ORDER — LOSARTAN POTASSIUM 50 MG PO TABS
50.0000 mg | ORAL_TABLET | Freq: Every morning | ORAL | 3 refills | Status: AC
  Filled 2024-04-19: qty 90, 90d supply, fill #0
  Filled 2024-05-11: qty 90, 90d supply, fill #1

## 2024-04-19 MED ORDER — EMPAGLIFLOZIN 10 MG PO TABS
10.0000 mg | ORAL_TABLET | Freq: Every day | ORAL | 3 refills | Status: AC
  Filled 2024-04-19 – 2024-04-21 (×2): qty 90, 90d supply, fill #0

## 2024-04-19 MED ORDER — APIXABAN 5 MG PO TABS
5.0000 mg | ORAL_TABLET | Freq: Two times a day (BID) | ORAL | 3 refills | Status: AC
  Filled 2024-04-19: qty 180, 90d supply, fill #0

## 2024-04-19 NOTE — Assessment & Plan Note (Signed)
 Paroxysmal A-fib.  Diagnosed October 2024. With underlying amyloid cardiomyopathy, continue with anticoagulation. Tolerating Eliquis  5 mg twice daily well.

## 2024-04-19 NOTE — Patient Instructions (Signed)
 Medication Instructions:  Your physician recommends that you continue on your current medications as directed. Please refer to the Current Medication list given to you today.  *If you need a refill on your cardiac medications before your next appointment, please call your pharmacy*  Lab Work: Your physician recommends that you return for lab work in:   Labs today: BMP, Magnesium , CBC  If you have labs (blood work) drawn today and your tests are completely normal, you will receive your results only by: MyChart Message (if you have MyChart) OR A paper copy in the mail If you have any lab test that is abnormal or we need to change your treatment, we will call you to review the results.  Testing/Procedures: None  Follow-Up: At [facility], you and your health needs are our priority.  As part of our continuing mission to provide you with exceptional heart care, our providers are all part of one team.  This team includes your primary Cardiologist (physician) and Advanced Practice Providers or APPs (Physician Assistants and Nurse Practitioners) who all work together to provide you with the care you need, when you need it.  Your next appointment:   6 month(s)  Provider:   Alean Kobus, MD    We recommend signing up for the patient portal called MyChart.  Sign up information is provided on this After Visit Summary.  MyChart is used to connect with patients for Virtual Visits (Telemedicine).  Patients are able to view lab/test results, encounter notes, upcoming appointments, etc.  Non-urgent messages can be sent to your provider as well.   To learn more about what you can do with MyChart, go to [email or web address].   Other Instructions None

## 2024-04-19 NOTE — Progress Notes (Signed)
 Cardiology Consultation:    Date:  04/19/2024   ID:  Deanna [name], DOB 03-Apr-1946, MRN [id number]  PCP:  Deanna Rojelio Caldron, NP  Cardiologist:  Deanna SAUNDERS Jesslyn Viglione, MD   Referring MD: Deanna Rojelio Caldron, NP   No chief complaint on file.    ASSESSMENT AND PLAN:   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 Waddell.   Preferred no genetic testing to avoid effect on her insurance   Here for follow-up visit.  Problem List Items Addressed This Visit     A-fib (HCC)   Paroxysmal A-fib.  Diagnosed October 2024. With underlying amyloid cardiomyopathy, continue with anticoagulation. Tolerating Eliquis  5 mg twice daily well.       Relevant Medications   apixaban  (ELIQUIS ) 5 MG TABS tablet   furosemide  (LASIX ) 20 MG tablet   losartan  (COZAAR ) 50 MG tablet   metoprolol  succinate (TOPROL -XL) 100 MG 24 hr tablet   rosuvastatin  (CRESTOR ) 20 MG tablet   spironolactone  (ALDACTONE ) 25 MG tablet   Amyloidosis (HCC) - Primary   Had established care with Dr. Arnetha. 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  Targeted therapies here to be started.  She has  not had follow-up with Dr. Arnetha.  Will reach out to his clinic to set up follow-up visit.  She is willing to start targeted therapies.      CHF (congestive heart failure) (HCC)   Mildly reduced LVEF 45% by cardiac MRI. Continue with salt and fluid restriction less than 2 g/day salt.  Continue daily weight monitoring at home. Home scale 158 to 159 pounds.  Obtain BMP, magnesium , CBC.  Continue guideline directed medical therapy and reviewed the importance and rationale. Continue metoprolol  XL 100 mg once daily Losartan  50 mg once daily Spironolactone  25 mg once daily Jardiance  10 mg once daily. Tolerating well.       Relevant Medications   apixaban  (ELIQUIS ) 5 MG TABS tablet   furosemide  (LASIX ) 20 MG tablet   losartan  (COZAAR ) 50 MG tablet   metoprolol  succinate (TOPROL -XL) 100 MG 24 hr tablet   rosuvastatin  (CRESTOR ) 20 MG tablet   spironolactone  (ALDACTONE ) 25 MG tablet   Other Relevant Orders   Basic Metabolic Panel (BMET)   Magnesium    CBC  Return to clinic follow-up tentatively in 6 months.    History of Present Illness:    Deanna [name] is a 78 y.o. female who is being seen today for follow-up visit. PCP Deanna Rojelio Caldron, NP. Last visit with me in the office was 02/18/2024. Has established care with Dr. Arnetha for amyloidosis management. Has established care with EP Dr. Waddell for pacemaker implant. Pulmonology follows up with Dr.Assaker at [facility] care in [facility] Oncology follows up with Dr. Renold at [facility].    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 Waddell.   Preferred no genetic testing to avoid effect on her insurance.  Overall has been doing well.  Feels there is some improvement in her day-to-day activities in comparison to 2 months ago. She does still get out of breath with her day-to-day activities but not as bad as 2 months ago.  No symptoms at rest. No orthopnea. Denies any palpitations.  Weight has been steady around 158 to 159 pounds at home. Blood pressures have been steady and systolic at most times appears to be around upper 120s and low 130s. No syncopal or near syncopal episodes. No blood in urine or stools.   Past Medical History:  Diagnosis Date   A-fib (HCC)    Abnormal glucose 03/06/2020   Acute coronary syndrome (HCC) 02/09/2023   Age-related osteoporosis without current pathological fracture 11/22/2015   Overview:  Femoral neck  Formatting of this note might be different from the original. Femoral neck   Allergic rhinitis due to pollen 08/19/2015   Allergy    Aortic atherosclerosis 04/04/2021   Arthritis    Atrial fibrillation (HCC) 01/29/2024   Cardiac arrhythmia 01/11/2024   Carpal tunnel syndrome, bilateral 02/28/2002   Overview:  H/O CTS   Celiac disease    Cervical disc disorder 05/10/2009   CHF (congestive heart failure) (HCC) 02/18/2024   Chronic cough 02/15/2006   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 11/26/2015   Formatting of this note might be different from the original. Added automatically from request for surgery [id number]   Chronic pain syndrome 04/01/2015   Colon polyps    COPD, moderate (HCC) 01/11/2024   DOE (dyspnea  on exertion)    Dyspnea    Essential hypertension 07/20/2000   Overview:  HBP   Family history of adverse reaction to anesthesia    mother delerium   Female cystocele 02/27/2009   GAD (generalized anxiety disorder) 08/19/2015   GERD (gastroesophageal reflux disease)    Heart murmur    History of Bell's palsy 03/21/2015   Hx of adenomatous colonic polyps 12/20/2008   Overview:  Colonoscopy 07/2003 - LMD - polyps removed - recommended repeat in 3 years Colonoscopy 09/2011 - Dr. Brenita - hyperplastic polyp(s) were removed - repeat in 5 years   Hypertension    Hypertrophic cardiomyopathy (HCC) 10/10/2023   Cardiomyopathy with severe asymmetric septal hypertrophy on echocardiogram  Cardiac MRI April 2025: LVEF 45%, late gadolinium enhancement in noncoronary distribution, possible amyloidosis.  Cannot exclude hypertrophic cardiomyopathy     Incomplete uterovaginal prolapse 03/04/2004   Insomnia    Left bundle-branch block 05/20/2006   Left ventricular hypertrophy 05/23/2015   Lumbar radiculopathy 01/09/2016   Formatting of this note might be different from the original. Added automatically from request for surgery [id number]   LVH (left ventricular 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 05/23/2015   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   Mitral regurgitation 04/04/2021   Mixed dyslipidemia 04/04/2021   Non-rheumatic mitral regurgitation 03/21/2015  Osteopenia determined by x-ray 11/22/2015   Formatting of this note might be different from the original. Femoral neck   Other intervertebral disc degeneration, lumbar region 01/29/2015   Pre-operative cardiovascular examination 09/03/2020   Primary osteoarthritis, right hand 02/28/2002   Overview:  OA HANDS, ESP THUMBS   Radial tunnel syndrome, right 11/04/2016   Radiculopathy, lumbar region 11/22/2014   Overview:  Added automatically from request for surgery [id number]   Rhinitis, allergic 02/24/2002   Overview:  ALLERGY SYMPTOMS - states was  seen by allergist in past and informed allergic to dust   S/P left total hip arthroplasty 09/17/2020   S/P total right hip arthroplasty 02/18/2023   Sacroiliitis 08/02/2019   Screening for colon cancer 04/17/2005   Formatting of this note might be different from the original. 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 TRIGGER THUMBS - started spring 2003. Injected 02/28/02   Unspecified asthma, uncomplicated 12/25/2004    Past Surgical History:  Procedure Laterality Date   ABDOMINAL HYSTERECTOMY  2010   APPENDECTOMY  1969   BIV PACEMAKER INSERTION CRT-P N/A 02/02/2024   Procedure: BIV PACEMAKER INSERTION CRT-P;  Surgeon: Waddell Danelle ORN, MD;  Location: [facility] INVASIVE CV LAB;  Service: Cardiovascular;  Laterality: N/A;   CHOLECYSTECTOMY  1969   SPINAL FUSION  01/2016   TONSILLECTOMY AND ADENOIDECTOMY  1956   TOTAL HIP ARTHROPLASTY Left 09/17/2020   Procedure: TOTAL HIP ARTHROPLASTY ANTERIOR APPROACH;  Surgeon: Ernie Cough, MD;  Location: [facility] ORS;  Service: Orthopedics;  Laterality: Left;  70 mins   TOTAL HIP ARTHROPLASTY Right 02/18/2023   Procedure: TOTAL HIP ARTHROPLASTY ANTERIOR APPROACH;  Surgeon: Ernie Cough, MD;  Location: [facility] OR[facility];  Service: Orthopedics;  Laterality: Right;    Current Medications: Current Meds  Medication Sig   albuterol  (VENTOLIN  HFA) 108 (90 Base) MCG/ACT inhaler Inhale 2 puffs into the lungs every 6 (six) hours as needed for wheezing or shortness of breath   Ascorbic Acid  (VITAMIN C ) 1000 MG tablet Take 4,000 mg by mouth daily.   Calcium -Magnesium  (CAL-MAG PO) Take 2,000 mg by mouth at bedtime. 1000 mg    Carboxymethylcellul-Glycerin  (CLEAR EYES FOR DRY EYES OP) Place 1 drop into both eyes daily.   ergocalciferol  (VITAMIN D2) 1.25 MG (50000 UT) capsule Take 1 capsule (50,000 Units total) by mouth once a week.   fluorouracil (EFUDEX) 5 % cream Apply 1 Application topically 2 (two) times daily.   fluticasone  (FLONASE ) 50 MCG/ACT nasal spray Place 2 sprays into both nostrils daily as needed.   hydrocortisone  2.5 % cream Apply to the affected areas (hemorrhoids) topically 2 (two) times daily as needed.   loratadine  (CLARITIN ) 10 MG tablet Take 10 mg by mouth daily as needed for allergies.   milk thistle 175 MG tablet Take 175 mg by mouth daily.   nitroGLYCERIN  (NITROSTAT ) 0.4 MG SL tablet Place 1 tablet (0.4 mg total) under the tongue every 5 (five) minutes x 3 doses as needed for chest pain.   omeprazole  (PRILOSEC) 40 MG capsule Take 1 capsule (40 mg total) by mouth daily.   Probiotic Product (PROBIOTIC DAILY PO) Take 1 tablet by mouth daily.   Tiotropium  Bromide-Olodaterol (STIOLTO RESPIMAT ) 2.5-2.5 MCG/ACT AERS Inhale 2 puffs into the lungs daily.   traMADol  (ULTRAM ) 50 MG tablet Take 1 tablet (50 mg total) by mouth every 12 (twelve) hours for lower back pain.   Vitamin A  2400 MCG (8000 UT) CAPS Take 16,000 Units by mouth daily. 8000 units   zolpidem  (AMBIEN ) 5 MG tablet Take 1 tablet (5 mg total) by mouth at bedtime as needed for sleep.   [DISCONTINUED] apixaban  (ELIQUIS ) 5 MG TABS tablet Take 1 tablet (5 mg total) by mouth 2 (two) times daily.   [DISCONTINUED] empagliflozin  (JARDIANCE ) 10 MG TABS tablet Take 1 tablet (10 mg total) by mouth daily before breakfast.   [DISCONTINUED] furosemide  (LASIX ) 20 MG tablet Take 1 tablet (20 mg total) by mouth daily.   [DISCONTINUED] hydrochlorothiazide  (HYDRODIURIL ) 25 MG tablet Take 25 mg by mouth 2 (two) times daily.   [DISCONTINUED] losartan  (COZAAR ) 50 MG tablet Take 1 tablet (50 mg total) by mouth in the morning.   [DISCONTINUED] metoprolol  succinate  (TOPROL -XL) 100 MG 24 hr tablet Take 1 tablet (100 mg total) by mouth daily. Take with or immediately following a meal.   [DISCONTINUED] rosuvastatin  (CRESTOR ) 20 MG tablet Take 1 tablet (20 mg total) by mouth daily.   [DISCONTINUED] spironolactone  (ALDACTONE ) 25 MG tablet Take 1 tablet (25 mg total) by mouth daily.     Allergies:   Codeine, Gabapentin , Gluten meal, Iodinated contrast media, Lisinopril, Dust mite extract, Sulfa antibiotics, and Terbinafine hcl   Social History   Socioeconomic History   Marital status: Divorced    Spouse name: Not on file   Number of children: 2   Years of education: 2 years college   Highest education level: Not on file  Occupational History   Occupation: Retired  Tobacco Use   Smoking status: Former    Current packs/day: 0.00    Average packs/day: 0.5 packs/day for 30.0 years (15.0 ttl pk-yrs)    Types: Cigarettes    Start date: 01/01/1968    Quit date: 12/31/1997    Years since quitting: 26.3   Smokeless tobacco: Never   Tobacco comments:    Quit in 1999  Vaping Use   Vaping status: Never Used  Substance and Sexual Activity   Alcohol  use: Not Currently   Drug use: No   Sexual activity: Not Currently    Birth control/protection: Post-menopausal  Other Topics Concern   Not on file  Social History Narrative   Lives alone.   Right-handed.   One cup caffeine per day.   Social Drivers of Health   Financial Resource Strain: High Risk (01/14/2022)   Overall Financial Resource Strain (CARDIA)    Difficulty of Paying Living Expenses: Hard  Food Insecurity: Food Insecurity Present (03/14/2024)   Hunger Vital Sign    Worried About Running Out of Food in the Last Year: Sometimes true    Ran Out of Food in the Last Year: Sometimes true  Transportation Needs: No Transportation Needs (03/14/2024)   PRAPARE - Administrator, Civil Service (Medical): No    Lack of Transportation (Non-Medical): No  Physical Activity: Inactive (01/14/2022)    Exercise Vital Sign    Days of Exercise per Week: 0 days    Minutes of Exercise per Session: 0 min  Stress: Stress Concern Present (01/14/2022)   [employer] of Occupational Health - Occupational Stress Questionnaire    Feeling of Stress : To some extent  Social Connections: Patient Declined (01/30/2024)   Social Connection and  Isolation Panel    Frequency of Communication with Friends and Family: Patient declined    Frequency of Social Gatherings with Friends and Family: Patient declined    Attends Religious Services: Patient declined    Database Administrator or Organizations: Patient declined    Attends Engineer, Structural: Patient declined    Marital Status: Patient declined     Family History: The patient's family history includes Arthritis in her mother; Diabetes in her mother and sister; Early death in her father and sister; Leukemia in her father and sister; Pancreatic cancer (age of onset: 15) in her mother. ROS:   Please see the history of present illness.    All 14 point review of systems negative except as described per history of present illness.  EKGs/Labs/Other Studies Reviewed:    The following studies were reviewed today: Cardiac Studies & Procedures   ______________________________________________________________________________________________   STRESS TESTS  ECHOCARDIOGRAM STRESS TEST 12/30/2023  Narrative EXERCISE STRESS ECHO REPORT   --------------------------------------------------------------------------------  Patient Name:   Deanna [name] Date of Exam: 12/30/2023 Medical Rec #:  [id number]       Height:       62.0 in Accession #:    [id number]      Weight:       166.0 lb Date of Birth:  [birth year]       BSA:          1.766 m Patient Age:    77 years        BP:           140/80 mmHg Patient Gender: F               HR:           67 bpm. Exam Location:  [address]  Procedure: Limited Echo, Stress Echo, Cardiac Doppler and  Limited Color Doppler  Indications:    I42.2 HOCM  History:        Patient has prior history of Echocardiogram examinations, most recent 02/02/2023.  Sonographer:    Heather Hawks RDCS Referring Phys: [facility]   Sonographer Comments: * IMPRESSIONS   1. The findings of the study demonstrate that no stress-induced outflow tract obstruction is present.  Conclusion(s)/Recommendation(s): No significant increase in LVOTO gradient with squat or treadmill.  FINDINGS  Exam Protocol: The patient exercised on a treadmill according to a Modified Bruce protocol.   Patient Performance: The patient exercised for 3 minutes. The maximum stage achieved was I of the Modified Bruce protocol. The baseline heart rate was 77 bpm. The heart rate at peak stress was 98 bpm. The target heart rate was calculated to be 122 bpm. The percentage of maximum predicted heart rate achieved was 68.4 %. The baseline blood pressure was 140/80 mmHg. The blood pressure at peak stress was 155/77 mmHg. The patient developed shortness of breath, fatigue, leg fatigue and dizziness during the stress exam. The symptoms resolved with rest.  EKG: Resting EKG showed normal sinus rhythm with left bundle branch block. The patient developed no abnormal EKG findings during exercise.   2D Echo Findings: The baseline ejection fraction was 75%. The peak ejection fraction at stress was 80%. Baseline regional wall motion abnormalities were not present.  Stress Doppler:  LVOT: The baseline LVOT gradient was 15 mmHG. The findings of the study demonstrate that no stress-induced outflow tract obstruction is present.  Mitral Regurgitation: At baseline, mild mitral regurgitation was present.   Morene Brownie Electronically signed on 12/30/2023  at 4:47:47 PM      Final   ECHOCARDIOGRAM  ECHOCARDIOGRAM COMPLETE 02/02/2023  Narrative ECHOCARDIOGRAM REPORT    Patient Name:   Deanna [name] Date of Exam:  02/02/2023 Medical Rec #:  [id number]       Height:       61.0 in Accession #:    [id number]      Weight:       159.4 lb Date of Birth:  09-Sep-1945       BSA:          1.715 m Patient Age:    76 years        BP:           140/62 mmHg Patient Gender: F               HR:           85 bpm. Exam Location:  [facility]  Procedure: 2D Echo, Cardiac Doppler and Color Doppler  Indications:     Dyspnea R06.00  History:         Patient has prior history of Echocardiogram examinations, most recent 09/09/2020. Risk Factors:Hypertension. LVH.  Sonographer:     Christopher Furnace Referring Phys:  [id number] DARRIN BARN Diagnosing Phys: Lonni Hanson MD  IMPRESSIONS   1. Left ventricular ejection fraction, by estimation, is 60 to 65%. The left ventricle has normal function. The left ventricle has no regional wall motion abnormalities. There is severe asymmetric left ventricular hypertrophy of the septal segment. Left ventricular diastolic parameters are consistent with Grade I diastolic dysfunction (impaired relaxation). 2. Right ventricular systolic function is normal. The right ventricular size is normal. 3. The mitral valve is normal in structure. Mild to moderate mitral valve regurgitation. No evidence of mitral stenosis. 4. The aortic valve is tricuspid. There is mild calcification of the aortic valve. There is mild thickening of the aortic valve. Aortic valve regurgitation is not visualized. Aortic valve sclerosis/calcification is present, without any evidence of aortic stenosis.  Conclusion(s)/Recommendation(s): Hypertrophic cardiomyopathy is a consideration with asymmetric septal hypertrophy. Further evaluation could be performed with cardiac MRI, as clinically indicated.  FINDINGS Left Ventricle: Left ventricular ejection fraction, by estimation, is 60 to 65%. The left ventricle has normal function. The left ventricle has no regional wall motion abnormalities. The left ventricular internal cavity size  was normal in size. There is severe asymmetric left ventricular hypertrophy of the septal segment. Left ventricular diastolic parameters are consistent with Grade I diastolic dysfunction (impaired relaxation).  Right Ventricle: The right ventricular size is normal. No increase in right ventricular wall thickness. Right ventricular systolic function is normal.  Left Atrium: Left atrial size was normal in size.  Right Atrium: Right atrial size was normal in size.  Pericardium: The pericardium was not well visualized.  Mitral Valve: The mitral valve is normal in structure. Mild to moderate mitral valve regurgitation. No evidence of mitral valve stenosis. MV peak gradient, 8.9 mmHg. The mean mitral valve gradient is 2.0 mmHg.  Tricuspid Valve: The tricuspid valve is not well visualized. Tricuspid valve regurgitation is trivial.  Aortic Valve: The aortic valve is tricuspid. There is mild calcification of the aortic valve. There is mild thickening of the aortic valve. Aortic valve regurgitation is not visualized. Aortic valve sclerosis/calcification is present, without any evidence of aortic stenosis. Aortic valve mean gradient measures 6.0 mmHg. Aortic valve peak gradient measures 8.8 mmHg. Aortic valve area, by VTI measures 3.24 cm.  Pulmonic Valve: The pulmonic valve was not  well visualized. Pulmonic valve regurgitation is not visualized. No evidence of pulmonic stenosis.  Aorta: The aortic root is normal in size and structure.  Pulmonary Artery: The pulmonary artery is not well seen.  Venous: The inferior vena cava was not well visualized.  IAS/Shunts: The interatrial septum was not well visualized.   LEFT VENTRICLE PLAX 2D LVIDd:         3.90 cm   Diastology LVIDs:         2.40 cm   LV e' medial:    6.20 cm/s LV PW:         0.90 cm   LV E/e' medial:  12.3 LV IVS:        2.40 cm   LV e' lateral:   5.55 cm/s LVOT diam:     2.00 cm   LV E/e' lateral: 13.8 LV SV:         93 LV SV  Index:   54 LVOT Area:     3.14 cm   RIGHT VENTRICLE RV Basal diam:  3.10 cm RV Mid diam:    2.50 cm  LEFT ATRIUM             Index        RIGHT ATRIUM           Index LA diam:        2.80 cm 1.63 cm/m   RA Area:     11.60 cm LA Vol (A2C):   50.4 ml 29.39 ml/m  RA Volume:   20.50 ml  11.95 ml/m LA Vol (A4C):   50.9 ml 29.68 ml/m LA Biplane Vol: 52.3 ml 30.49 ml/m AORTIC VALVE AV Area (Vmax):    2.84 cm AV Area (Vmean):   2.89 cm AV Area (VTI):     3.24 cm AV Vmax:           148.00 cm/s AV Vmean:          113.000 cm/s AV VTI:            0.286 m AV Peak Grad:      8.8 mmHg AV Mean Grad:      6.0 mmHg LVOT Vmax:         134.00 cm/s LVOT Vmean:        104.000 cm/s LVOT VTI:          0.295 m LVOT/AV VTI ratio: 1.03  AORTA Ao Root diam: 3.00 cm  MITRAL VALVE                  TRICUSPID VALVE MV Area (PHT): 3.81 cm       TR Peak grad:   16.8 mmHg MV Area VTI:   2.09 cm       TR Vmax:        205.00 cm/s MV Peak grad:  8.9 mmHg MV Mean grad:  2.0 mmHg       SHUNTS MV Vmax:       1.49 m/s       Systemic VTI:  0.30 m MV Vmean:      67.7 cm/s      Systemic Diam: 2.00 cm MV Decel Time: 199 msec MR Peak grad:    80.6 mmHg MR Mean grad:    36.0 mmHg MR Vmax:         449.00 cm/s MR Vmean:        263.0 cm/s MR PISA:         3.08 cm MR PISA  Eff ROA: 21 mm MR PISA Radius:  0.70 cm MV E velocity: 76.50 cm/s MV A velocity: 137.00 cm/s MV E/A ratio:  0.56  Christopher End MD Electronically signed by Lonni Hanson MD Signature Date/Time: 02/02/2023/1:12:54 PM    Final    MONITORS  LONG TERM MONITOR (3-14 DAYS) 01/06/2024  Narrative   Patient had a minimum heart rate of 28 bpm, maximum heart rate of 164 bpm, and average heart rate of 78 bpm. Predominant underlying rhythm was sinus rhythm. Ventricular Tachycardia lasting 12 seconds. No atrial fibrillation or flutter. Complete heart block noted. Symptomatic high grade AV block noted.  Patient notified of test;  seen by EP.     CARDIAC MRI  MR CARDIAC MORPHOLOGY W WO CONTRAST 09/29/2023  Narrative CLINICAL DATA:  Possible hypertrophic cardiomyopathy  EXAM: CARDIAC MRI  TECHNIQUE: The patient was scanned on a 1.5 Tesla GE magnet. A dedicated cardiac coil was used. Functional imaging was done using Fiesta sequences. 2,3, and 4 chamber views were done to assess for RWMA's. Modified Simpson's rule using a short axis stack was used to calculate an ejection fraction on a dedicated work [employer]. The patient received 8 cc of Gadavist . After 10 minutes inversion recovery sequences were used to assess for infiltration and scar tissue.  FINDINGS: Limited images of the lung fields showed no gross abnormalities.  Normal left ventricular size. Severe asymmetric basal septal and basal inferior hypertrophy. Diffuse mild hypokinesis, LV EF 46%. No mitral valve systolic anterior motion noted. Normal right ventricular size and systolic function, RV EF 50%. Mild left atrial enlargement, normal right atrium. Trileaflet aortic valve, visually no significant stenosis or regurgitation. Visually, no significant mitral regurgitation was noted.  DELAYED ENHANCEMENT IMAGING: DELAYED ENHANCEMENT IMAGING Confluent subendocardial late gadolinium enhancement (LGE) in the basal to mid anterior, anterolateral, inferolateral, and inferior walls. Mid-wall LGE throughout the mid anteroseptum and inferoseptum.  MEASUREMENTS: MEASUREMENTS LVEDV 95 mL  LVEDVi 52 mL/m2  LVSV 44 mL LVEF 46%  RVEDV 63 mL  RVEDVi 34 mL/m2 RVSV 31 mL RVEF 50%  Flow sequences not adequate.  Global T1 1128, ECV 37%  IMPRESSION: 1. Normal left ventricular size with severe asymmetric hypertrophy of the basal septum and basal inferior wall. Diffuse mild hypokinesis, LV EF 46%. No mitral valve systolic anterior motion noted.  2.  Normal RV size and systolic function, RV EF 50%.  3. Noncoronary LGE  pattern with confluent basal to mid anterior/anterolateral/inferolateral/inferior subendocardial LGE and mid-wall LGE throughout the anteroseptum and inferoseptum.  4.  Elevated extracellular volume percentage at 37%.  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 Hypertrophic cardiomyopathy is also on the differential given the asymmetric hypertrophy (though can see asymmetric hypertrophy with amyloidosis).  Dalton Mclean   Electronically Signed By: Ezra Shuck M.D. 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 The myocardium was positive for radiotracer uptake.   The visual grade of myocardial uptake relative to the ribs was Grade 3 (Myocardial uptake greater than rib uptake with mild/absent rib uptake).   CT images were obtained for attenuation correction and were examined for the presence of coronary calcium  when appropriate.   Coronary calcium  was present on the attenuation correction CT images. Moderate coronary calcifications were present. Coronary calcifications  were present in the right coronary artery distribution(s). Aortic atherosclerosis noted.  Aortic Valve calcification noted.  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 The myocardium was positive for radiotracer uptake.  IFE urine and serum testing recommend to rule out AL-CA.  ______________________________________________________________________________________________      EKG:       Recent Labs: 01/29/2024: ALT 22; B Natriuretic Peptide 116.4 02/29/2024: BUN 13; Creatinine, Ser 0.77; Hemoglobin 13.3; Magnesium  2.3; NT-Pro BNP 222; Platelets 282; Potassium 4.8; Sodium 136  Recent Lipid Panel    Component Value  Date/Time   CHOL 175 02/10/2023 0653   CHOL 153 11/04/2021 1031   TRIG 186 (H) 02/10/2023 0653   HDL 42 02/10/2023 0653   HDL 54 11/04/2021 1031   CHOLHDL 4.2 02/10/2023 0653   VLDL 37 02/10/2023 0653   LDLCALC 96 02/10/2023 0653   LDLCALC 89 11/04/2021 1031    Physical Exam:    VS:  BP 130/68   Pulse 70   Ht 5' 2 (1.575 m)   Wt 161 lb 6.4 oz (73.2 kg)   SpO2 95%   BMI 29.52 kg/m     Wt Readings from Last 3 Encounters:  04/19/24 161 lb 6.4 oz (73.2 kg)  03/14/24 162 lb 3.2 oz (73.6 kg)  03/10/24 163 lb 12.8 oz (74.3 kg)     GENERAL:  Well nourished, well developed in no acute distress NECK: No JVD; No carotid bruits CARDIAC: RRR, S1 and S2 present, no murmurs, no rubs, no gallops CHEST:  Clear to auscultation without rales, wheezing or rhonchi  Extremities: No pitting pedal edema. Pulses bilaterally symmetric with radial 2+ and dorsalis pedis 2+ NEUROLOGIC:  Alert and oriented x 3  Medication Adjustments/Labs and Tests Ordered: Current medicines are reviewed at length with the patient today.  Concerns regarding medicines are outlined above.  Orders Placed This Encounter  Procedures   Basic Metabolic Panel (BMET)   Magnesium    CBC   Meds ordered this encounter  Medications   empagliflozin  (JARDIANCE ) 10 MG TABS tablet    Sig: Take 1 tablet (10 mg total) by mouth daily before breakfast.    Dispense:  90 tablet    Refill:  3   apixaban  (ELIQUIS ) 5 MG TABS tablet    Sig: Take 1 tablet (5 mg total) by mouth 2 (two) times daily.    Dispense:  180 tablet    Refill:  3   furosemide  (LASIX ) 20 MG tablet    Sig: Take 1 tablet (20 mg total) by mouth daily.    Dispense:  90 tablet    Refill:  3   losartan  (COZAAR ) 50 MG tablet    Sig: Take 1 tablet (50 mg total) by mouth in the morning.    Dispense:  90 tablet    Refill:  3   metoprolol  succinate (TOPROL -XL) 100 MG 24 hr tablet    Sig: Take 1 tablet (100 mg total) by mouth daily. Take with or immediately following a  meal.    Dispense:  90 tablet    Refill:  3   rosuvastatin  (CRESTOR ) 20 MG tablet    Sig: Take 1 tablet (20 mg total) by mouth daily.    Dispense:  90 tablet    Refill:  3   spironolactone  (ALDACTONE ) 25 MG tablet    Sig: Take 1 tablet (25 mg total) by mouth daily.    Dispense:  90 tablet    Refill:  3    Signed, Lewanda Perea reddy Juniper Snyders, MD, MPH,  FACC. 04/19/2024 11:45 AM    [facility]

## 2024-04-19 NOTE — Assessment & Plan Note (Signed)
 Mildly reduced LVEF 45% by cardiac MRI. Continue with salt and fluid restriction less than 2 g/day salt.  Continue daily weight monitoring at home. Home scale 158 to 159 pounds.  Obtain BMP, magnesium , CBC.  Continue guideline directed medical therapy and reviewed the importance and rationale. Continue metoprolol  XL 100 mg once daily Losartan  50 mg once daily Spironolactone  25 mg once daily Jardiance  10 mg once daily. Tolerating well.

## 2024-04-19 NOTE — Assessment & Plan Note (Addendum)
 Had established care with Dr. Arnetha. 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  Targeted therapies to be started.   She has not had follow-up with Dr. Arnetha.  Will reach out to his clinic to set up follow-up visit.  She is willing to start targeted therapies.

## 2024-04-20 ENCOUNTER — Other Ambulatory Visit (HOSPITAL_COMMUNITY): Payer: Self-pay

## 2024-04-20 ENCOUNTER — Telehealth: Payer: Self-pay | Admitting: Pharmacy Technician

## 2024-04-20 NOTE — Telephone Encounter (Signed)
 SABRA

## 2024-04-21 ENCOUNTER — Ambulatory Visit: Payer: MEDICARE

## 2024-04-21 ENCOUNTER — Telehealth: Payer: Self-pay | Admitting: Internal Medicine

## 2024-04-21 ENCOUNTER — Other Ambulatory Visit (HOSPITAL_BASED_OUTPATIENT_CLINIC_OR_DEPARTMENT_OTHER): Payer: Self-pay

## 2024-04-21 NOTE — Telephone Encounter (Signed)
 I called and spoke with the patient. Advised her to send a manuel transmission. The patient has the APP on her phone- she is driving but will pull over and send a transmission to us .   She is aware I will call her back once received.  The patient voices understanding and is agreeable.

## 2024-04-21 NOTE — Telephone Encounter (Signed)
 Transmission received and reviewed- normal remote transmission. The patient has been notified that everything looks good with her leads. Advised the patient that with pulling this week she may have aggravated some still sensitive nerves around her implant site. No further recommendations at this time.  The patient voices understanding and is appreciative for the call back.

## 2024-04-21 NOTE — Telephone Encounter (Signed)
 Patient was putting a walker in the car on Wednesday of this week and she felt a little pull. Patient still feel a pull/uneasy feeling in her chest. Please advise

## 2024-04-21 NOTE — Telephone Encounter (Signed)
 Call to patient to discuss concerns, 2 identifiers used. Patient reports that as she was on her way to her appointment with Dr. Roberts on Wednesday 04/21/24, she was putting her new walker in her car. She had never used this walker before and she felt a pull in her left upper chest over her pacemaker site. She states it's not painful but it feels a little weird and asks for advice.   She denies any bruising or abrasion to the left upper chest where pacemaker sits. She states are is not painful to touch. She denies any pain with movement of arm. She states the weird pulling feeling comes on intermittently throughout the day independently of rest or exertion and lasts a few seconds. She thinks she has noticed 3-4 episodes per day. She denies any numbness/tingling or pain in left arm. She denies any SOB or palpitations. She asks if someone can check her pacemaker remotely to make sure it is still working. Forwarded to device clinic.

## 2024-04-22 LAB — MAGNESIUM: Magnesium: 2 mg/dL (ref 1.6–2.3)

## 2024-04-22 LAB — BASIC METABOLIC PANEL WITH GFR
BUN/Creatinine Ratio: 15 (ref 12–28)
BUN: 12 mg/dL (ref 8–27)
CO2: 26 mmol/L (ref 20–29)
Calcium: 10.2 mg/dL (ref 8.7–10.3)
Chloride: 101 mmol/L (ref 96–106)
Creatinine, Ser: 0.78 mg/dL (ref 0.57–1.00)
Glucose: 96 mg/dL (ref 70–99)
Potassium: 4.5 mmol/L (ref 3.5–5.2)
Sodium: 141 mmol/L (ref 134–144)
eGFR: 78 mL/min/1.73 (ref >59)

## 2024-04-22 LAB — CBC
Hematocrit: 43.3 % (ref 34.0–46.6)
Hemoglobin: 13.9 g/dL (ref 11.1–15.9)
MCH: 28.5 pg (ref 26.6–33.0)
MCHC: 32.1 g/dL (ref 31.5–35.7)
MCV: 89 fL (ref 79–97)
Platelets: 311 x10E3/uL (ref 150–450)
RBC: 4.87 x10E6/uL (ref 3.77–5.28)
RDW: 12.7 % (ref 11.7–15.4)
WBC: 7.4 x10E3/uL (ref 3.4–10.8)

## 2024-04-24 ENCOUNTER — Emergency Department (HOSPITAL_BASED_OUTPATIENT_CLINIC_OR_DEPARTMENT_OTHER)
Admission: EM | Admit: 2024-04-24 | Discharge: 2024-04-24 | Disposition: A | Discharge status: Home / Self Care | Payer: MEDICARE | Attending: Emergency Medicine | Admitting: Emergency Medicine

## 2024-04-24 ENCOUNTER — Other Ambulatory Visit: Payer: Self-pay

## 2024-04-24 ENCOUNTER — Emergency Department (HOSPITAL_BASED_OUTPATIENT_CLINIC_OR_DEPARTMENT_OTHER): Payer: MEDICARE

## 2024-04-24 DIAGNOSIS — R519 Headache, unspecified: Secondary | ICD-10-CM | POA: Diagnosis present

## 2024-04-24 DIAGNOSIS — I639 Cerebral infarction, unspecified: Secondary | ICD-10-CM

## 2024-04-24 DIAGNOSIS — I6381 Other cerebral infarction due to occlusion or stenosis of small artery: Secondary | ICD-10-CM | POA: Diagnosis not present

## 2024-04-24 DIAGNOSIS — M62838 Other muscle spasm: Secondary | ICD-10-CM

## 2024-04-24 DIAGNOSIS — Z7901 Long term (current) use of anticoagulants: Secondary | ICD-10-CM | POA: Insufficient documentation

## 2024-04-24 LAB — COMPREHENSIVE METABOLIC PANEL WITH GFR
ALT: 17 U/L (ref 0–44)
AST: 22 U/L (ref 15–41)
Albumin: 4.7 g/dL (ref 3.5–5.0)
Alkaline Phosphatase: 81 U/L (ref 38–126)
Anion gap: 11 (ref 5–15)
BUN: 16 mg/dL (ref 8–23)
CO2: 27 mmol/L (ref 22–32)
Calcium: 10.3 mg/dL (ref 8.9–10.3)
Chloride: 100 mmol/L (ref 98–111)
Creatinine, Ser: 0.83 mg/dL (ref 0.44–1.00)
GFR, Estimated: >60 mL/min (ref >60)
Glucose, Bld: 106 mg/dL — ABNORMAL HIGH (ref 70–99)
Potassium: 4.4 mmol/L (ref 3.5–5.1)
Sodium: 138 mmol/L (ref 135–145)
Total Bilirubin: 0.4 mg/dL (ref 0.0–1.2)
Total Protein: 7.6 g/dL (ref 6.5–8.1)

## 2024-04-24 LAB — DIFFERENTIAL
Abs Immature Granulocytes: 0.04 K/uL (ref 0.00–0.07)
Basophils Absolute: 0.1 K/uL (ref 0.0–0.1)
Basophils Relative: 1 %
Eosinophils Absolute: 0.2 K/uL (ref 0.0–0.5)
Eosinophils Relative: 2 %
Immature Granulocytes: 1 %
Lymphocytes Relative: 17 %
Lymphs Abs: 1.5 K/uL (ref 0.7–4.0)
Monocytes Absolute: 0.8 K/uL (ref 0.1–1.0)
Monocytes Relative: 10 %
Neutro Abs: 5.9 K/uL (ref 1.7–7.7)
Neutrophils Relative %: 69 %

## 2024-04-24 LAB — CBC
HCT: 42.8 % (ref 36.0–46.0)
Hemoglobin: 14.3 g/dL (ref 12.0–15.0)
MCH: 28.5 pg (ref 26.0–34.0)
MCHC: 33.4 g/dL (ref 30.0–36.0)
MCV: 85.4 fL (ref 80.0–100.0)
Platelets: 290 K/uL (ref 150–400)
RBC: 5.01 MIL/uL (ref 3.87–5.11)
RDW: 13.3 % (ref 11.5–15.5)
WBC: 8.5 K/uL (ref 4.0–10.5)
nRBC: 0 % (ref 0.0–0.2)

## 2024-04-24 LAB — PROTIME-INR
INR: 1.1 (ref 0.8–1.2)
Prothrombin Time: 15 s (ref 11.4–15.2)

## 2024-04-24 LAB — APTT: aPTT: 36 s (ref 24–36)

## 2024-04-24 NOTE — ED Provider Notes (Signed)
 [place] [facility] AT [facility] Provider Note   CSN: [id number] Arrival date & time: 04/24/24  1605     Patient presents with: Transient Ischemic Attack   Deanna [name] is a 78 y.o. female.   HPI   Pt states yesterday she was feeling weak, dizzy.  She had a headache.  Pt then developed a cramp in her hand where it locked up today.. She then felt cramping in her hand.  It was painful.  It lasted about 10 to 15 minutes.  She then started develop a cramp in her foot that lasted about 15 minutes or so.  Patient denies any trouble with her vision.  No difficulty with her speech.  She has not had any fevers or chills.  No redness or swelling  Prior to Admission medications   Medication Sig Start Date End Date Taking? Authorizing Provider  albuterol  (VENTOLIN  HFA) 108 (90 Base) MCG/ACT inhaler Inhale 2 puffs into the lungs every 6 (six) hours as needed for wheezing or shortness of breath 05/21/23   Assaker, Darrin, MD  apixaban  (ELIQUIS ) 5 MG TABS tablet Take 1 tablet (5 mg total) by mouth 2 (two) times daily. 04/19/24   Madireddy, Alean SAUNDERS, MD  Ascorbic Acid  (VITAMIN C ) 1000 MG tablet Take 4,000 mg by mouth daily.    [provider]  Calcium -Magnesium  (CAL-MAG PO) Take 2,000 mg by mouth at bedtime. 1000 mg    [provider]  Carboxymethylcellul-Glycerin  (CLEAR EYES FOR DRY EYES OP) Place 1 drop into both eyes daily.    [provider]  empagliflozin  (JARDIANCE ) 10 MG TABS tablet Take 1 tablet (10 mg total) by mouth daily before breakfast. 04/19/24   Madireddy, Alean SAUNDERS, MD  ergocalciferol  (VITAMIN D2) 1.25 MG (50000 UT) capsule Take 1 capsule (50,000 Units total) by mouth once a week. 11/15/23     fluorouracil (EFUDEX) 5 % cream Apply 1 Application topically 2 (two) times daily. 09/14/23   [provider]  fluticasone  (FLONASE ) 50 MCG/ACT nasal spray Place 2 sprays into both nostrils daily as needed. 02/08/24   [provider]  furosemide  (LASIX ) 20 MG tablet Take 1 tablet (20 mg total) by mouth daily. 04/19/24   Madireddy, Alean SAUNDERS, MD  hydrocortisone  2.5 % cream Apply to the affected areas (hemorrhoids) topically 2 (two) times daily as needed. 12/24/23     loratadine  (CLARITIN ) 10 MG tablet Take 10 mg by mouth daily as needed for allergies.    [provider]  losartan  (COZAAR ) 50 MG tablet Take 1 tablet (50 mg total) by mouth in the morning. 04/19/24   Madireddy, Alean SAUNDERS, MD  metoprolol  succinate (TOPROL -XL) 100 MG 24 hr tablet Take 1 tablet (100 mg total) by mouth daily. Take with or immediately following a meal. 04/19/24   Madireddy, Alean SAUNDERS, MD  milk thistle 175 MG tablet Take 175 mg by mouth daily.    [provider]  nitroGLYCERIN  (NITROSTAT ) 0.4 MG SL tablet Place 1 tablet (0.4 mg total) under the tongue every 5 (five) minutes x 3 doses as needed for chest pain. 02/11/23   Claudene Pacific, MD  omeprazole  (PRILOSEC) 40 MG capsule Take 1 capsule (40 mg total) by mouth daily. 01/12/24   Cobb, Comer GAILS, NP  Probiotic Product (PROBIOTIC DAILY PO) Take 1 tablet by mouth daily.    [provider]  rosuvastatin  (CRESTOR ) 20 MG tablet Take 1 tablet (20 mg total) by mouth daily. 04/19/24   Madireddy, Alean SAUNDERS, MD  spironolactone  (ALDACTONE ) 25 MG tablet Take 1 tablet (25 mg total) by mouth daily. 04/19/24   Madireddy, Sreedhar R, MD  Tiotropium Bromide-Olodaterol (STIOLTO RESPIMAT ) 2.5-2.5 MCG/ACT AERS Inhale 2 puffs into the lungs daily. 02/10/24   Cobb, Comer GAILS, NP  traMADol  (ULTRAM ) 50 MG tablet Take 1 tablet (50 mg total) by mouth every 12 (twelve) hours for lower back pain. 03/20/24     Vitamin A  2400 MCG (8000 UT) CAPS Take 16,000 Units by mouth daily. 8000 units    [provider]  zolpidem  (AMBIEN ) 5 MG tablet Take 1 tablet (5 mg total) by mouth at bedtime as needed for sleep. 01/06/24       Allergies: Codeine, Gabapentin , Gluten meal, Iodinated contrast  media, Lisinopril, Dust mite extract, Sulfa antibiotics, and Terbinafine hcl    Review of Systems  Updated Vital Signs BP 137/71   Pulse 80   Temp 97.9 F (36.6 C) (Oral)   Resp 18   SpO2 97%   Physical Exam Vitals and nursing note reviewed.  Constitutional:      General: She is not in acute distress.    Appearance: She is well-developed.  HENT:     Head: Normocephalic and atraumatic.     Right Ear: External ear normal.     Left Ear: External ear normal.  Eyes:     General: No visual field deficit or scleral icterus.       Right eye: No discharge.        Left eye: No discharge.     Conjunctiva/sclera: Conjunctivae normal.  Neck:     Trachea: No tracheal deviation.  Cardiovascular:     Rate and Rhythm: Normal rate and regular rhythm.  Pulmonary:     Effort: Pulmonary effort is normal. No respiratory distress.     Breath sounds: Normal breath sounds. No stridor. No wheezing or rales.  Abdominal:     General: Bowel sounds are normal. There is no distension.     Palpations: Abdomen is soft.     Tenderness: There is no abdominal tenderness. There is no guarding or rebound.  Musculoskeletal:        General: No tenderness.     Cervical back: Neck supple.  Skin:    General: Skin is warm and dry.     Findings: No rash.  Neurological:     Mental Status: She is alert and oriented to Schlegel, place, and time.     Cranial Nerves: No cranial nerve deficit, dysarthria or facial asymmetry.     Sensory: No sensory deficit.     Motor: No abnormal muscle tone, seizure activity or pronator drift.     Coordination: Coordination normal.     Comments:  able to hold both legs off bed for 5 seconds, sensation intact in all extremities,  no left or right sided neglect, normal finger-nose exam bilaterally, no nystagmus noted   Psychiatric:        Mood and Affect: Mood normal.     (all labs ordered are listed, but only abnormal results are displayed) Labs Reviewed  COMPREHENSIVE  METABOLIC PANEL WITH GFR - Abnormal; Notable for the following components:      Result Value   Glucose, Bld 106 (*)    All other components within normal limits  PROTIME-INR  APTT  CBC  DIFFERENTIAL  URINE DRUG SCREEN  CBG MONITORING, ED    EKG: EKG Interpretation Date/Time:  Monday April 24 2024 16:14:11 EST Ventricular Rate:  86 PR Interval:  182  QRS Duration:  128 QT Interval:  416 QTC Calculation: 497 R Axis:   29  Text Interpretation: Atrial-sensed ventricular-paced rhythm Abnormal ECG When compared with ECG of 03-Feb-2024 06:13, Vent. rate has increased BY   9 BPM Confirmed by Randol Simmonds [phone]) on 04/24/2024 4:17:27 PM  Radiology: CT HEAD WO CONTRAST Result Date: 04/24/2024 CLINICAL DATA:  Neuro deficit, concern for stroke, blurry vision, dizziness. EXAM: CT HEAD WITHOUT CONTRAST TECHNIQUE: Contiguous axial images were obtained from the base of the skull through the vertex without intravenous contrast. RADIATION DOSE REDUCTION: This exam was performed according to the departmental dose-optimization program which includes automated exposure control, adjustment of the mA and/or kV according to patient size and/or use of iterative reconstruction technique. COMPARISON:  CT head 05/29/2023. FINDINGS: Brain: No acute intracranial hemorrhage. No CT evidence of acute infarct. No edema, mass effect, or midline shift. The basilar cisterns are patent. Hypoattenuation in the supratentorial white matter suggestive of mild chronic microvascular ischemic changes. Small remote lacunar infarct in the left lentiform nucleus. Ventricles: The ventricles are normal. Vascular: Atherosclerosis of the carotid siphons and intracranial left vertebral artery. Skull: No acute or aggressive finding. Orbits: Orbits are symmetric. Sinuses: The visualized paranasal sinuses are clear. Other: Mastoid air cells are clear. IMPRESSION: No CT evidence of acute intracranial abnormality. Mild chronic microvascular  ischemic changes. Small remote lacunar infarct in the left lentiform nucleus. Electronically Signed   By: Donnice Mania M.D.   On: 04/24/2024 17:21     Procedures   Medications Ordered in the ED - No data to display  Clinical Course as of 04/24/24 2027  Mon Apr 24, 2024  2026 MCHC: 33.4 CBC metabolic panel normal.  INR normal [JK]  2026 Head CT without acute findings but old stroke noted [JK]    Clinical Course User Index [JK] Randol Simmonds, MD                                 Medical Decision Making Problems Addressed: Cerebrovascular accident (CVA), unspecified mechanism [facility]): chronic illness or injury Muscle spasm: acute illness or injury that poses a threat to life or bodily functions  Amount and/or Complexity of Data Reviewed Labs: ordered. Decision-making details documented in ED Course. Radiology: ordered and independent interpretation performed.  Risk Risk Details: Patient presented with symptoms of muscle cramping and spasm in her hand.  Then devolved to her foot.  Patient states it was painful.  Low suspicion for stroke TIA.  Symptoms she describes sound more like a muscle spasm.  The fact that it was painful in her hand argues against stroke.  Consider the possibility of some type of seizure but overall low suspicion for that as well.  Patient's ED workup is otherwise reassuring.  Her laboratory test are unremarkable.  Patient however does have evidence of an old stroke on CT scan.  She is not aware of this.  MRI is not available at this facility.  Patient also has a pacemaker and would not be able to have an MRI this evening.  I have low suspicion for stroke TIA and do not think she requires admission at this time.  I do think she would benefit from outpatient follow-up with neurology considering the CT scan finding however        Final diagnoses:  Muscle spasm  Cerebrovascular accident (CVA), unspecified mechanism [facility])    ED Discharge Orders  Ordered     Ambulatory referral to Neurology       Comments: An appointment is requested in approximately: 2 weeks   04/24/24 2024               Randol Simmonds, MD 04/24/24 2027

## 2024-04-24 NOTE — ED Triage Notes (Signed)
 Yesterday blurry vision, dizziness, chills. Today dizziness. 1200 numbness, tension in right hand and left foot lasting 15 minutes or so. No focal weakness in triage. Sensation intact. NIH 0 in triage.

## 2024-04-24 NOTE — Discharge Instructions (Addendum)
 Continue your current medications.  Follow-up with the neurologist for further evaluation as we discussed.  Return to the [facility], ED if you have any recurrent episodes  The CT scan did show an old stroke.  I do not think that is related to the symptoms you experienced today.  I have placed a referral to neurology to follow-up on that finding

## 2024-05-04 ENCOUNTER — Ambulatory Visit: Payer: MEDICARE | Attending: Internal Medicine | Admitting: Internal Medicine

## 2024-05-04 ENCOUNTER — Encounter: Payer: Self-pay | Admitting: Internal Medicine

## 2024-05-04 VITALS — BP 136/74 | HR 87 | Ht 62.0 in | Wt 160.9 lb

## 2024-05-04 DIAGNOSIS — I480 Paroxysmal atrial fibrillation: Secondary | ICD-10-CM | POA: Diagnosis present

## 2024-05-04 NOTE — Progress Notes (Signed)
 HPI [name] returns for ongoing evaluation of symptomatic bradycardia and VT in the setting of HCM. She is a pleasant 78 yo woman with HTN who was found to have an abnormal cMRI. She wore a cardiac monitor which demonstrated 30 seconds of asymptomatic sustained VT at 150/min as well as periods of CHB, both during the night time and daytime. She was exercising in rehab when she felt like she was going to pass out. She has been found to have 9 second nocturnal pauses but 6 second daytime pauses in the setting of beta blocker therapy. Since her PPM was placed she has done well with no syncope.  Allergies  Allergen Reactions   Codeine Nausea And Vomiting and Palpitations     Nausea and/or Vomiting, chest pain and acid reflux Feels like she is having a heart attack.     Gabapentin  Other (See Comments)    Eyes went numb and blurry  Blurred Vision as well   Gluten Meal Diarrhea and Other (See Comments)    Severe diarrhea per member    Iodinated Contrast Media Cough and Dermatitis    Immediately after contrast administration, patient experienced a scratchy throat and a diffuse rash along her arms and chest requiring benadryl  administration. Normal vitals.    Iodinated contrast media (substance)   Lisinopril Other (See Comments)   Dust Mite Extract     Upper Respiratory issues    Sulfa Antibiotics Other (See Comments)   Terbinafine Hcl Rash     Current Outpatient Medications  Medication Sig Dispense Refill   albuterol  (VENTOLIN  HFA) 108 (90 Base) MCG/ACT inhaler Inhale 2 puffs into the lungs every 6 (six) hours as needed for wheezing or shortness of breath 6.7 g 3   apixaban  (ELIQUIS ) 5 MG TABS tablet Take 1 tablet (5 mg total) by mouth 2 (two) times daily. 180 tablet 3   Ascorbic Acid  (VITAMIN C ) 1000 MG tablet Take 4,000 mg by mouth daily.     Calcium -Magnesium  (CAL-MAG PO) Take 2,000 mg by mouth at bedtime. 1000 mg     Carboxymethylcellul-Glycerin  (CLEAR EYES FOR DRY EYES OP)  Place 1 drop into both eyes daily.     empagliflozin  (JARDIANCE ) 10 MG TABS tablet Take 1 tablet (10 mg total) by mouth daily before breakfast. 90 tablet 3   ergocalciferol  (VITAMIN D2) 1.25 MG (50000 UT) capsule Take 1 capsule (50,000 Units total) by mouth once a week. 12 capsule 3   fluorouracil (EFUDEX) 5 % cream Apply 1 Application topically 2 (two) times daily.     fluticasone  (FLONASE ) 50 MCG/ACT nasal spray Place 2 sprays into both nostrils daily as needed.     furosemide  (LASIX ) 20 MG tablet Take 1 tablet (20 mg total) by mouth daily. 90 tablet 3   hydrocortisone  2.5 % cream Apply to the affected areas (hemorrhoids) topically 2 (two) times daily as needed. 56 g 3   loratadine  (CLARITIN ) 10 MG tablet Take 10 mg by mouth daily as needed for allergies.     losartan  (COZAAR ) 50 MG tablet Take 1 tablet (50 mg total) by mouth in the morning. 90 tablet 3   metoprolol  succinate (TOPROL -XL) 100 MG 24 hr tablet Take 1 tablet (100 mg total) by mouth daily. Take with or immediately following a meal. 90 tablet 3   milk thistle 175 MG tablet Take 175 mg by mouth daily.     nitroGLYCERIN  (NITROSTAT ) 0.4 MG SL tablet Place 1 tablet (0.4 mg total) under the tongue  every 5 (five) minutes x 3 doses as needed for chest pain. 25 tablet 1   omeprazole  (PRILOSEC) 40 MG capsule Take 1 capsule (40 mg total) by mouth daily. 30 capsule 1   Probiotic Product (PROBIOTIC DAILY PO) Take 1 tablet by mouth daily.     rosuvastatin  (CRESTOR ) 20 MG tablet Take 1 tablet (20 mg total) by mouth daily. 90 tablet 3   spironolactone  (ALDACTONE ) 25 MG tablet Take 1 tablet (25 mg total) by mouth daily. 90 tablet 3   Tiotropium Bromide-Olodaterol (STIOLTO RESPIMAT ) 2.5-2.5 MCG/ACT AERS Inhale 2 puffs into the lungs daily. 4 g 5   traMADol  (ULTRAM ) 50 MG tablet Take 1 tablet (50 mg total) by mouth every 12 (twelve) hours for lower back pain. 60 tablet 2   Vitamin A  2400 MCG (8000 UT) CAPS Take 16,000 Units by mouth daily. 8000 units      zolpidem  (AMBIEN ) 5 MG tablet Take 1 tablet (5 mg total) by mouth at bedtime as needed for sleep. 30 tablet 2   No current facility-administered medications for this visit.     Past Medical History:  Diagnosis Date   A-fib (HCC)    Abnormal glucose 03/06/2020   Acute coronary syndrome (HCC) 02/09/2023   Age-related osteoporosis without current pathological fracture 11/22/2015   Overview:  Femoral neck  Formatting of this note might be different from the original. Femoral neck   Allergic rhinitis due to pollen 08/19/2015   Allergy    Aortic atherosclerosis 04/04/2021   Arthritis    Atrial fibrillation (HCC) 01/29/2024   Cardiac arrhythmia 01/11/2024   Carpal tunnel syndrome, bilateral 02/28/2002   Overview:  H/O CTS   Celiac disease    Cervical disc disorder 05/10/2009   CHF (congestive heart failure) (HCC) 02/18/2024   Chronic cough 02/15/2006   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 11/26/2015   Formatting of this note might be different from the original. 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  Cannot exclude hypertrophic cardiomyopathy     Incomplete uterovaginal prolapse 03/04/2004   Insomnia    Left bundle-branch block 05/20/2006   Left ventricular hypertrophy 05/23/2015   Lumbar radiculopathy 01/09/2016   Formatting of this note might be different from the original. 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 Femoral neck   Other intervertebral disc degeneration, lumbar region 01/29/2015   Pre-operative cardiovascular examination 09/03/2020   Primary osteoarthritis, right hand 02/28/2002   Overview:  OA HANDS, ESP THUMBS   Radial tunnel syndrome, right 11/04/2016   Radiculopathy, lumbar region 11/22/2014   Overview:  Added automatically from request for surgery [id number]   Rhinitis, allergic 02/24/2002   Overview:  ALLERGY SYMPTOMS - states was seen by allergist in past and informed allergic to dust   S/P left total hip arthroplasty 09/17/2020   S/P total right hip arthroplasty 02/18/2023   Sacroiliitis 08/02/2019   Screening for colon cancer 04/17/2005   Formatting of this note might be different from the original. 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 TRIGGER THUMBS - started spring 2003. Injected 02/28/02   Unspecified asthma,  uncomplicated 12/25/2004    ROS:   All systems reviewed and negative except as noted in the HPI.   Past Surgical History:  Procedure Laterality Date   ABDOMINAL HYSTERECTOMY  2010   APPENDECTOMY  1969   BIV PACEMAKER INSERTION CRT-P N/A 02/02/2024   Procedure: BIV PACEMAKER INSERTION CRT-P;  Surgeon: Waddell Deanna ORN, MD;  Location: [facility] INVASIVE CV LAB;  Service: Cardiovascular;  Laterality: N/A;   CHOLECYSTECTOMY  1969   SPINAL FUSION  01/2016   TONSILLECTOMY AND ADENOIDECTOMY  1956   TOTAL HIP ARTHROPLASTY Left 09/17/2020   Procedure: TOTAL HIP ARTHROPLASTY ANTERIOR APPROACH;  Surgeon: Ernie Cough, MD;  Location: [facility];  Service: Orthopedics;  Laterality: Left;  70 mins   TOTAL HIP ARTHROPLASTY Right 02/18/2023   Procedure: TOTAL HIP ARTHROPLASTY ANTERIOR APPROACH;  Surgeon: Ernie Cough, MD;  Location: [facility];  Service: Orthopedics;  Laterality: Right;     Family History  Problem Relation Age of Onset   Arthritis Mother    Diabetes Mother    Pancreatic cancer Mother 36   Early death Father    Leukemia Father    Leukemia Sister    Early death Sister    Diabetes Sister      Social History   Socioeconomic History   Marital status: Divorced    Spouse name: Not on file   Number of children: 2   Years of education: 2 years college   Highest education level: Not on file  Occupational History   Occupation: Retired  Tobacco Use   Smoking status: Former    Current packs/day: 0.00    Average packs/day: 0.5 packs/day for 30.0 years (15.0 ttl pk-yrs)    Types: Cigarettes    Start date: 01/01/1968    Quit date: 12/31/1997    Years since quitting: 26.3   Smokeless tobacco: Never   Tobacco comments:    Quit in 1999  Vaping Use   Vaping status: Never Used  Substance and Sexual Activity   Alcohol  use: Not Currently   Drug use: No   Sexual activity: Not Currently    Birth control/protection: Post-menopausal  Other Topics Concern   Not on file  Social History Narrative    Lives alone.   Right-handed.   One cup caffeine per day.   Social Drivers of Health  Financial Resource Strain: High Risk (01/14/2022)   Overall Financial Resource Strain (CARDIA)    Difficulty of Paying Living Expenses: Hard  Food Insecurity: Food Insecurity Present (03/14/2024)   Hunger Vital Sign    Worried About Running Out of Food in the Last Year: Sometimes true    Ran Out of Food in the Last Year: Sometimes true  Transportation Needs: No Transportation Needs (03/14/2024)   PRAPARE - Administrator, Civil Service (Medical): No    Lack of Transportation (Non-Medical): No  Physical Activity: Inactive (01/14/2022)   Exercise Vital Sign    Days of Exercise per Week: 0 days    Minutes of Exercise per Session: 0 min  Stress: Stress Concern Present (01/14/2022)   [employer] of Occupational Health - Occupational Stress Questionnaire    Feeling of Stress : To some extent  Social Connections: Patient Declined (01/30/2024)   Social Connection and Isolation Panel    Frequency of Communication with Friends and Family: Patient declined    Frequency of Social Gatherings with Friends and Family: Patient declined    Attends Religious Services: Patient declined    Database Administrator or Organizations: Patient declined    Attends Banker Meetings: Patient declined    Marital Status: Patient declined  Intimate Partner Violence: Not At Risk (03/14/2024)   Humiliation, Afraid, Rape, and Kick questionnaire    Fear of Current or Ex-Partner: No    Emotionally Abused: No    Physically Abused: No    Sexually Abused: No     BP 136/74   Pulse 87   Ht 5' 2 (1.575 m)   Wt 160 lb 14.4 oz (73 kg)   SpO2 96%   BMI 29.43 kg/m   Physical Exam:  Well appearing 78 yo woman, NAD HEENT: Unremarkable Neck:  No JVD, no thyromegally Lymphatics:  No adenopathy Back:  No CVA tenderness Lungs:  Clear with no wheezes HEART:  Regular rate rhythm, no murmurs, no rubs, no  clicks Abd:  soft, positive bowel sounds, no organomegally, no rebound, no guarding Ext:  2 plus pulses, no edema, no cyanosis, no clubbing Skin:  No rashes no nodules Neuro:  CN II through XII intact, motor grossly intact  EKG - NSR with ventricular pacing  DEVICE  Normal device function.  See PaceArt for details.   Assess/Plan:  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 I have recommended pacing. She is s/p PPM and doing well. VT - she has not had more and feels well. She is asymptomatic.  Mild LV dysfunction - she has an EF of 45-50%. She has class 2 symptoms.    Deanna Jaxin Fulfer,MD

## 2024-05-04 NOTE — Patient Instructions (Signed)

## 2024-05-05 ENCOUNTER — Other Ambulatory Visit: Payer: Self-pay

## 2024-05-05 ENCOUNTER — Encounter (HOSPITAL_COMMUNITY): Payer: Self-pay

## 2024-05-05 ENCOUNTER — Other Ambulatory Visit (HOSPITAL_BASED_OUTPATIENT_CLINIC_OR_DEPARTMENT_OTHER): Payer: Self-pay

## 2024-05-05 ENCOUNTER — Other Ambulatory Visit (HOSPITAL_COMMUNITY): Payer: Self-pay

## 2024-05-05 ENCOUNTER — Telehealth: Payer: Self-pay | Admitting: Pharmacy Technician

## 2024-05-05 ENCOUNTER — Ambulatory Visit: Payer: MEDICARE | Attending: Internal Medicine | Admitting: Internal Medicine

## 2024-05-05 VITALS — BP 115/70 | HR 72 | Ht 62.0 in | Wt 160.0 lb

## 2024-05-05 DIAGNOSIS — Z79899 Other long term (current) drug therapy: Secondary | ICD-10-CM

## 2024-05-05 DIAGNOSIS — E859 Amyloidosis, unspecified: Secondary | ICD-10-CM

## 2024-05-05 MED ORDER — ATTRUBY 356 MG PO TBPK: 2.0000 | ORAL_TABLET | Freq: Two times a day (BID) | ORAL | 5 refills | Status: DC

## 2024-05-05 MED ORDER — PREGABALIN 50 MG PO CAPS
50.0000 mg | ORAL_CAPSULE | Freq: Three times a day (TID) | ORAL | 0 refills | Status: AC
  Filled 2024-05-05: qty 30, 10d supply, fill #0

## 2024-05-05 MED ORDER — ATTRUBY 356 MG PO TBPK
2.0000 | ORAL_TABLET | Freq: Two times a day (BID) | ORAL | 5 refills | Status: AC
  Filled 2024-05-05: qty 112, 28d supply, fill #0
  Filled 2024-05-29: qty 140, 35d supply, fill #1
  Filled 2024-06-02: qty 112, 28d supply, fill #1
  Filled 2024-06-22 – 2024-06-26 (×2): qty 112, 28d supply, fill #2

## 2024-05-05 NOTE — Telephone Encounter (Signed)
 Patient was counseled on Attruby  while in clinic to see Dr. Tellis today. Reviewed dosing, side effects. Rx sent to [facility]. [employer] grant was obtained for next year

## 2024-05-05 NOTE — Patient Instructions (Signed)
 Medication Instructions:  Your physician has recommended you make the following change in your medication:   ** You will begin Acoramadis and will be contacted by our pharmacy team.  *If you need a refill on your cardiac medications before your next appointment, please call your pharmacy*  Lab Work: Vitamin A  level in 2 weeks If you have labs (blood work) drawn today and your tests are completely normal, you will receive your results only by: MyChart Message (if you have MyChart) OR A paper copy in the mail If you have any lab test that is abnormal or we need to change your treatment, we will call you to review the results.  Testing/Procedures: None ordered.   Follow-Up: At [facility] you and your health needs are our priority.  As part of our continuing mission to provide you with exceptional heart care, our providers are all part of one team.  This team includes your primary Cardiologist (physician) and Advanced Practice Providers or APPs (Physician Assistants and Nurse Practitioners) who all work together to provide you with the care you need, when you need it.  Your next appointment:   March 2026 with Dr Santo

## 2024-05-05 NOTE — Progress Notes (Signed)
 Specialty Pharmacy Initiation Note   Deanna [name] is a 78 y.o. female who will be followed by the specialty pharmacy service for RxSp Cardiology    Review of administration, indication, effectiveness, safety, potential side effects, storage/disposable, and missed dose instructions occurred today for patient's specialty medication(s) Acoramidis  HCl (Attruby )     Patient/Caregiver did not have any additional questions or concerns.   Patient's therapy is appropriate to: Initiate    Goals Addressed             This Visit's Progress    Stabilization of disease       Patient is initiating therapy. Patient will maintain adherence         Delon CHRISTELLA Brow Specialty Pharmacist

## 2024-05-05 NOTE — Progress Notes (Signed)
 Cardiology Office Note:  .    Date:  05/05/2024  ID:  Erminio LABOR Encalada, DOB [birth year], MRN [id number] PCP: Delores Rojelio Caldron, NP  [facility] Providers Cardiologist:  None     CC: Follow up ATTR-CA  History of Present Illness: .    Craig Wisnewski Wolden is a 78 y.o. 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  She has a history of cardiac amyloidosis with a grade three uptake and an ECV elevation of thirty-seven percent. Her symptoms include shortness of breath with activities such as bathing, dressing, and walking. Occasionally, she experiences shortness of breath while sitting, as noted during an episode on Thanksgiving when her heart rate increased to 101 bpm after laughing, before returning to 80 bpm.  She has a history of conduction disease and has been fitted with a pacemaker, which has improved her symptoms. The patient reports that Dr. Waddell told her the pacemaker settings were adjusted, with the lower ventricles pacing at three percent and the upper at thirteen percent. She feels better since the pacemaker was implanted, although she still experiences 'chunks of breath' with activity.  She takes Lasix  (furosemide ) daily and metoprolol , which was increased to 100 mg to manage her heart rhythm issues. She experiences occasional lethargy and low energy, particularly in the mornings, and had a recent episode of feeling lethargic and sleeping more than usual without respiratory symptoms.  She has been on vitamin A  supplementation since 2015, which she believes helps with her hair and skin. She also has a diagnosis of celiac disease, which influences her treatment options.  She has a family history that includes siblings, children, and grandchildren. She has not undergone genetic testing for cardiac amyloidosis.  Discussed the use of AI scribe software for clinical note transcription with the patient, who gave verbal consent to  proceed.   Relevant histories: .  Social  - Sister: deceased at age 9 due to Leukemia - No family history of hypertrophic cardiomyopathy. - Comes with one of her daughters (second visit) ROS: As per HPI.   Studies Reviewed: .     Cardiac Studies & Procedures   ______________________________________________________________________________________________   STRESS TESTS  ECHOCARDIOGRAM STRESS TEST 12/30/2023  Narrative EXERCISE STRESS ECHO REPORT   --------------------------------------------------------------------------------  Patient Name:   Carlisia Geno Mccollam Date of Exam: 12/30/2023 Medical Rec #:  [id number]       Height:       62.0 in Accession #:    [id number]      Weight:       166.0 lb Date of Birth:  1946-03-15       BSA:          1.766 m Patient Age:    77 years        BP:           140/80 mmHg Patient Gender: F               HR:           67 bpm. Exam Location:  [address]  Procedure: Limited Echo, Stress Echo, Cardiac Doppler and Limited Color Doppler  Indications:    I42.2 HOCM  History:        Patient has prior history of Echocardiogram examinations, most recent 02/02/2023.  Sonographer:    Heather Hawks RDCS Referring Phys: [facility] LABOR LEAVENS   Sonographer Comments: * IMPRESSIONS   1. The findings of the study demonstrate that  no stress-induced outflow tract obstruction is present.  Conclusion(s)/Recommendation(s): No significant increase in LVOTO gradient with squat or treadmill.  FINDINGS  Exam Protocol: The patient exercised on a treadmill according to a Modified Bruce protocol.   Patient Performance: The patient exercised for 3 minutes. The maximum stage achieved was I of the Modified Bruce protocol. The baseline heart rate was 77 bpm. The heart rate at peak stress was 98 bpm. The target heart rate was calculated to be 122 bpm. The percentage of maximum predicted heart rate achieved was 68.4 %. The baseline blood pressure was 140/80  mmHg. The blood pressure at peak stress was 155/77 mmHg. The patient developed shortness of breath, fatigue, leg fatigue and dizziness during the stress exam. The symptoms resolved with rest.  EKG: Resting EKG showed normal sinus rhythm with left bundle branch block. The patient developed no abnormal EKG findings during exercise.   2D Echo Findings: The baseline ejection fraction was 75%. The peak ejection fraction at stress was 80%. Baseline regional wall motion abnormalities were not present.  Stress Doppler:  LVOT: The baseline LVOT gradient was 15 mmHG. The findings of the study demonstrate that no stress-induced outflow tract obstruction is present.  Mitral Regurgitation: At baseline, mild mitral regurgitation was present.   Morene Brownie Electronically signed on 12/30/2023 at 4:47:47 PM      Final   ECHOCARDIOGRAM  ECHOCARDIOGRAM COMPLETE 02/02/2023  Narrative ECHOCARDIOGRAM REPORT    Patient Name:   Miyani A Ebarb Date of Exam: 02/02/2023 Medical Rec #:  [id number]       Height:       61.0 in Accession #:    [id number]      Weight:       159.4 lb Date of Birth:  [birth year]       BSA:          1.715 m Patient Age:    76 years        BP:           140/62 mmHg Patient Gender: F               HR:           85 bpm. Exam Location:  [facility]  Procedure: 2D Echo, Cardiac Doppler and Color Doppler  Indications:     Dyspnea R06.00  History:         Patient has prior history of Echocardiogram examinations, most recent 09/09/2020. Risk Factors:Hypertension. LVH.  Sonographer:     Christopher Furnace Referring Phys:  [id number] DARRIN BARN Diagnosing Phys: Lonni Hanson MD  IMPRESSIONS   1. Left ventricular ejection fraction, by estimation, is 60 to 65%. The left ventricle has normal function. The left ventricle has no regional wall motion abnormalities. There is severe asymmetric left ventricular hypertrophy of the septal segment. Left ventricular diastolic parameters are  consistent with Grade I diastolic dysfunction (impaired relaxation). 2. Right ventricular systolic function is normal. The right ventricular size is normal. 3. The mitral valve is normal in structure. Mild to moderate mitral valve regurgitation. No evidence of mitral stenosis. 4. The aortic valve is tricuspid. There is mild calcification of the aortic valve. There is mild thickening of the aortic valve. Aortic valve regurgitation is not visualized. Aortic valve sclerosis/calcification is present, without any evidence of aortic stenosis.  Conclusion(s)/Recommendation(s): Hypertrophic cardiomyopathy is a consideration with asymmetric septal hypertrophy. Further evaluation could be performed with cardiac MRI, as clinically indicated.  FINDINGS Left Ventricle: Left ventricular ejection fraction, by estimation, is  60 to 65%. The left ventricle has normal function. The left ventricle has no regional wall motion abnormalities. The left ventricular internal cavity size was normal in size. There is severe asymmetric left ventricular hypertrophy of the septal segment. Left ventricular diastolic parameters are consistent with Grade I diastolic dysfunction (impaired relaxation).  Right Ventricle: The right ventricular size is normal. No increase in right ventricular wall thickness. Right ventricular systolic function is normal.  Left Atrium: Left atrial size was normal in size.  Right Atrium: Right atrial size was normal in size.  Pericardium: The pericardium was not well visualized.  Mitral Valve: The mitral valve is normal in structure. Mild to moderate mitral valve regurgitation. No evidence of mitral valve stenosis. MV peak gradient, 8.9 mmHg. The mean mitral valve gradient is 2.0 mmHg.  Tricuspid Valve: The tricuspid valve is not well visualized. Tricuspid valve regurgitation is trivial.  Aortic Valve: The aortic valve is tricuspid. There is mild calcification of the aortic valve. There is mild  thickening of the aortic valve. Aortic valve regurgitation is not visualized. Aortic valve sclerosis/calcification is present, without any evidence of aortic stenosis. Aortic valve mean gradient measures 6.0 mmHg. Aortic valve peak gradient measures 8.8 mmHg. Aortic valve area, by VTI measures 3.24 cm.  Pulmonic Valve: The pulmonic valve was not well visualized. Pulmonic valve regurgitation is not visualized. No evidence of pulmonic stenosis.  Aorta: The aortic root is normal in size and structure.  Pulmonary Artery: The pulmonary artery is not well seen.  Venous: The inferior vena cava was not well visualized.  IAS/Shunts: The interatrial septum was not well visualized.   LEFT VENTRICLE PLAX 2D LVIDd:         3.90 cm   Diastology LVIDs:         2.40 cm   LV e' medial:    6.20 cm/s LV PW:         0.90 cm   LV E/e' medial:  12.3 LV IVS:        2.40 cm   LV e' lateral:   5.55 cm/s LVOT diam:     2.00 cm   LV E/e' lateral: 13.8 LV SV:         93 LV SV Index:   54 LVOT Area:     3.14 cm   RIGHT VENTRICLE RV Basal diam:  3.10 cm RV Mid diam:    2.50 cm  LEFT ATRIUM             Index        RIGHT ATRIUM           Index LA diam:        2.80 cm 1.63 cm/m   RA Area:     11.60 cm LA Vol (A2C):   50.4 ml 29.39 ml/m  RA Volume:   20.50 ml  11.95 ml/m LA Vol (A4C):   50.9 ml 29.68 ml/m LA Biplane Vol: 52.3 ml 30.49 ml/m AORTIC VALVE AV Area (Vmax):    2.84 cm AV Area (Vmean):   2.89 cm AV Area (VTI):     3.24 cm AV Vmax:           148.00 cm/s AV Vmean:          113.000 cm/s AV VTI:            0.286 m AV Peak Grad:      8.8 mmHg AV Mean Grad:      6.0 mmHg LVOT Vmax:  134.00 cm/s LVOT Vmean:        104.000 cm/s LVOT VTI:          0.295 m LVOT/AV VTI ratio: 1.03  AORTA Ao Root diam: 3.00 cm  MITRAL VALVE                  TRICUSPID VALVE MV Area (PHT): 3.81 cm       TR Peak grad:   16.8 mmHg MV Area VTI:   2.09 cm       TR Vmax:        205.00 cm/s MV Peak  grad:  8.9 mmHg MV Mean grad:  2.0 mmHg       SHUNTS MV Vmax:       1.49 m/s       Systemic VTI:  0.30 m MV Vmean:      67.7 cm/s      Systemic Diam: 2.00 cm MV Decel Time: 199 msec MR Peak grad:    80.6 mmHg MR Mean grad:    36.0 mmHg MR Vmax:         449.00 cm/s MR Vmean:        263.0 cm/s MR PISA:         3.08 cm MR PISA Eff ROA: 21 mm MR PISA Radius:  0.70 cm MV E velocity: 76.50 cm/s MV A velocity: 137.00 cm/s MV E/A ratio:  0.56  Christopher End MD Electronically signed by Lonni Hanson MD Signature Date/Time: 02/02/2023/1:12:54 PM    Final    MONITORS  LONG TERM MONITOR (3-14 DAYS) 01/06/2024  Narrative   Patient had a minimum heart rate of 28 bpm, maximum heart rate of 164 bpm, and average heart rate of 78 bpm. Predominant underlying rhythm was sinus rhythm. Ventricular Tachycardia lasting 12 seconds. No atrial fibrillation or flutter. Complete heart block noted. Symptomatic high grade AV block noted.  Patient notified of test; seen by EP.     CARDIAC MRI  MR CARDIAC MORPHOLOGY W WO CONTRAST 09/29/2023  Narrative CLINICAL DATA:  Possible hypertrophic cardiomyopathy  EXAM: CARDIAC MRI  TECHNIQUE: The patient was scanned on a 1.5 Tesla GE magnet. A dedicated cardiac coil was used. Functional imaging was done using Fiesta sequences. 2,3, and 4 chamber views were done to assess for RWMA's. Modified Simpson's rule using a short axis stack was used to calculate an ejection fraction on a dedicated work [employer]. The patient received 8 cc of Gadavist . After 10 minutes inversion recovery sequences were used to assess for infiltration and scar tissue.  FINDINGS: Limited images of the lung fields showed no gross abnormalities.  Normal left ventricular size. Severe asymmetric basal septal and basal inferior hypertrophy. Diffuse mild hypokinesis, LV EF 46%. No mitral valve systolic anterior motion noted. Normal right ventricular size  and systolic function, RV EF 50%. Mild left atrial enlargement, normal right atrium. Trileaflet aortic valve, visually no significant stenosis or regurgitation. Visually, no significant mitral regurgitation was noted.  DELAYED ENHANCEMENT IMAGING: DELAYED ENHANCEMENT IMAGING Confluent subendocardial late gadolinium enhancement (LGE) in the basal to mid anterior, anterolateral, inferolateral, and inferior walls. Mid-wall LGE throughout the mid anteroseptum and inferoseptum.  MEASUREMENTS: MEASUREMENTS LVEDV 95 mL  LVEDVi 52 mL/m2  LVSV 44 mL LVEF 46%  RVEDV 63 mL  RVEDVi 34 mL/m2 RVSV 31 mL RVEF 50%  Flow sequences not adequate.  Global T1 1128, ECV 37%  IMPRESSION: 1. Normal left ventricular size with severe asymmetric hypertrophy of the basal septum and basal  inferior wall. Diffuse mild hypokinesis, LV EF 46%. No mitral valve systolic anterior motion noted.  2.  Normal RV size and systolic function, RV EF 50%.  3. Noncoronary LGE pattern with confluent basal to mid anterior/anterolateral/inferolateral/inferior subendocardial LGE and mid-wall LGE throughout the anteroseptum and inferoseptum.  4.  Elevated extracellular volume percentage at 37%.  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 Hypertrophic cardiomyopathy is also on the differential given the asymmetric hypertrophy (though can see asymmetric hypertrophy with amyloidosis).  Dalton Mclean   Electronically Signed By: Ezra Shuck M.D. 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 The myocardium was positive for radiotracer uptake.   The visual grade of myocardial uptake relative to the ribs was Grade 3  (Myocardial uptake greater than rib uptake with mild/absent rib uptake).   CT images were obtained for attenuation correction and were examined for the presence of coronary calcium  when appropriate.   Coronary calcium  was present on the attenuation correction CT images. Moderate coronary calcifications were present. Coronary calcifications were present in the right coronary artery distribution(s). Aortic atherosclerosis noted.  Aortic Valve calcification noted.  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 The myocardium was positive for radiotracer uptake.  IFE urine and serum testing recommend to rule out AL-CA.  ______________________________________________________________________________________________       Physical Exam:    VS:  BP 115/70   Pulse 72   Ht 5' 2 (1.575 m)   Wt 160 lb (72.6 kg)   SpO2 95%   BMI 29.26 kg/m    Wt Readings from Last 3 Encounters:  05/05/24 160 lb (72.6 kg)  05/04/24 160 lb 14.4 oz (73 kg)  04/19/24 161 lb 6.4 oz (73.2 kg)    Gen: no distress   Neck: No JVD Cardiac: No Rubs or Gallops, holosystolic standing murmur, RRR +2 radial pulses Respiratory: Clear to auscultation bilaterally, normal effort, normal  respiratory rate GI: Soft, nontender, non-distended  MS: No  edema;  moves all extremities Integument: Skin feels warm Neuro:  At time of evaluation, alert and oriented to [place]/place/time/situation  Psych: Normal affect, patient feels Fair   ASSESSMENT AND PLAN: .    Transthyretin-related cardiac amyloidosis Confirmed with grade three uptake and ECV elevation of 37%. Symptoms include dyspnea on exertion and occasional orthopnea. No congestive heart failure or reduced ejection fraction. Discussed treatment options including Tafamidis and Acoramadis. Acoramadis chosen due to potential silencing capability and potential benefit for peripheral neuropathy. Insurance coverage confirmed for . Discussed potential side effects.  Consideration  of future clinical trials and potential gene therapy options. - Will start acoramadis on Monday or Tuesday pending insurance approval. - Will monitor for side effects  - continue SGLT2i and lasix  - stop Vitamin A  and get level in two weeks - may need increase in lasix  (SOB) or decrease in BB (has NSVT but AV nodal therapy not tolerated well in CA patients)  Atrial fibrillation with cardiac conduction disease and pacemaker Atrial fibrillation managed with pacemaker. Pacemaker settings adjusted to minimize pacing, allowing natural heart function. No episodes of waking up with palpitations since pacemaker placement. Discussed balance between natural heart function and pacemaker support. Current settings allow for pacing only when necessary, preserving natural heart function. - Continue current pacemaker settings with  minimal pacing. - Monitor for any changes in symptoms or arrhythmias.  Peripheral neuropathy Symptoms possibly related to cardiac amyloidosis. Discussed potential benefit of Acramidase for neuropathic symptoms. No definitive diagnosis of biceps tendon rupture, which can be associated with cardiac amyloidosis. - Monitor neuropathic symptoms and assess response to Acramidase.  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 We reviewed the following information:  INDICATION FOR TESTING: * Suspected/confirmed diagnosis of htATTR  TESTING OPTION(S) DISCUSSED: * ATTR Testing  BENEFITS OF GENETIC TESTING DISCUSSED:  * May refine clinical diagnosis * May guide treatment decisions in the future and may be used for clinica trials * May identify at-risk family members who could benefit from  screening  LIMITATIONS AND RISKS DISCUSSED:  * Possible psychological impact of positive or negative results * Possible discrimination concerns  FINANCIAL CONSIDERATIONS DISCUSSED:  * Free testing program eligibility   LIFE INSURANCE/DISABILITY INSURANCE IMPLICATIONS:  * GINA protection applies to health insurance and employment but not life, disability, or long-term care insurance * Recommendation to consider securing insurance prior to testing if concerned * Documentation considerations for insurance applications  FAMILY IMPLICATIONS: * Cascade testing approach for relatives if positive result * Communication strategies with family members * Resources for family discussions  PATIENT UNDERSTANDING AND DECISION:  * Patient demonstrated understanding of the above information * Patient had opportunity to ask questions which were addressed * Patient decision: wants more time to consider  FOLLOW-UP PLAN: If interested we will pursue [employer] Sponsored Testing  Stanly Leavens, MD FASE [facility] Cardiologist [facility]  [address] [place], [address] [place], [place] [address] [phone]  2:32 PM   Time:   I have spent a total of 65 minutes with the patient reviewing notes, imaging, EKGs, labs,  and examining the patient as well as establishing an assessment and plan that was discussed personally with the patient. Discussed disease state education , using shared decision making tools and cardiac modeling , and genetic testing and clinica trials. Reviewed care and plan in collaboration with daughter and pharmacy   Stanly Leavens, MD FASE [facility] Cardiologist [facility]  [facility]  [address] [place], [address] [place], [place] [address] [phone]  9:28 AM

## 2024-05-05 NOTE — Telephone Encounter (Signed)
 She is going to need grant next year. Lets go ahead and get it now, just incase something happens with funding. Please put the grant in [facility]. Cardiomyopathy grant ATTR cardiomyopathy

## 2024-05-05 NOTE — Telephone Encounter (Signed)
 Patient Advocate Encounter   The patient was approved for a [employer] grant that will help cover the cost of ATTRUBY  Total amount awarded, 7500.  Effective: 04/05/24 - 04/04/25   APW:[id number] ERW:[id number] Hmnle:[id number] PI:[id number] [employer] ID: [id number]   Pharmacy provided with approval and processing information. In [place]

## 2024-05-05 NOTE — Progress Notes (Signed)
 Specialty Pharmacy Initial Fill Coordination Note  Deanna [name] is a 78 y.o. female contacted today regarding initial fill of specialty medication(s) Acoramidis  HCl (Attruby )   Patient requested Delivery   Delivery date: 05/09/24   Verified address: [address] [place] [address]   Medication will be filled on: 05/08/24   Patient is aware of $0 copayment.

## 2024-05-08 ENCOUNTER — Other Ambulatory Visit: Payer: Self-pay

## 2024-05-08 ENCOUNTER — Other Ambulatory Visit (HOSPITAL_COMMUNITY): Payer: Self-pay

## 2024-05-10 ENCOUNTER — Other Ambulatory Visit (HOSPITAL_BASED_OUTPATIENT_CLINIC_OR_DEPARTMENT_OTHER): Payer: Self-pay

## 2024-05-10 ENCOUNTER — Other Ambulatory Visit: Payer: Self-pay | Admitting: Nurse Practitioner

## 2024-05-11 ENCOUNTER — Other Ambulatory Visit (HOSPITAL_BASED_OUTPATIENT_CLINIC_OR_DEPARTMENT_OTHER): Payer: Self-pay

## 2024-05-11 MED ORDER — ZOLPIDEM TARTRATE 5 MG PO TABS
5.0000 mg | ORAL_TABLET | Freq: Every evening | ORAL | 2 refills | Status: AC | PRN
  Filled 2024-05-11 – 2024-07-07 (×3): qty 30, 30d supply, fill #0

## 2024-05-11 MED ORDER — OMEPRAZOLE 40 MG PO CPDR
40.0000 mg | DELAYED_RELEASE_CAPSULE | Freq: Every day | ORAL | 11 refills | Status: AC
  Filled 2024-05-11: qty 30, 30d supply, fill #0

## 2024-05-12 ENCOUNTER — Telehealth: Payer: Self-pay | Admitting: Internal Medicine

## 2024-05-12 NOTE — Telephone Encounter (Signed)
 Spoke with patient on the phone regarding restrictions. Informed patient that last arm restrictions were put in place for 6 weeks on 02/17/2024 that are over now. And after her 11/21 concerns there were no restrictions put in place.   Pt concerned about pulling sensation to incision site. Denies any redness, swelling, drainage, or warmth to area. States that incision is closed. Denies pain. Explained to pt that I will forward to device clinic for advisement.

## 2024-05-12 NOTE — Telephone Encounter (Signed)
 Spoke with patient who has seen Dr. Waddell for previous concerns at incision site. Patient advised nothing has changed she just wanted to make sure no restrictions were in place at this time for therapy. Writer advised no restrictions in place at this time and advised patient incision may feel taut at considering it is a newer device and make take time to adjust to. Patient voiced understanding and was appreciative of call back.

## 2024-05-12 NOTE — Telephone Encounter (Signed)
 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  Patient wants to discuss her physical limitations.

## 2024-05-15 ENCOUNTER — Other Ambulatory Visit (HOSPITAL_BASED_OUTPATIENT_CLINIC_OR_DEPARTMENT_OTHER): Payer: Self-pay

## 2024-05-15 MED ORDER — TRAMADOL HCL 50 MG PO TABS
50.0000 mg | ORAL_TABLET | Freq: Two times a day (BID) | ORAL | 2 refills | Status: AC
  Filled 2024-05-15: qty 60, 30d supply, fill #0
  Filled 2024-06-23: qty 60, 30d supply, fill #1

## 2024-05-16 ENCOUNTER — Other Ambulatory Visit (HOSPITAL_BASED_OUTPATIENT_CLINIC_OR_DEPARTMENT_OTHER): Payer: Self-pay

## 2024-05-27 ENCOUNTER — Other Ambulatory Visit (HOSPITAL_BASED_OUTPATIENT_CLINIC_OR_DEPARTMENT_OTHER): Payer: Self-pay

## 2024-05-29 ENCOUNTER — Other Ambulatory Visit (HOSPITAL_COMMUNITY): Payer: Self-pay

## 2024-05-31 ENCOUNTER — Other Ambulatory Visit (HOSPITAL_COMMUNITY): Payer: Self-pay

## 2024-05-31 ENCOUNTER — Other Ambulatory Visit: Payer: Self-pay

## 2024-05-31 NOTE — Progress Notes (Signed)
 Specialty Pharmacy Refill Coordination Note  Deanna [name] is a 78 y.o. female contacted today regarding refills of specialty medication(s) Acoramidis  HCl (Attruby )   Patient requested Delivery   Delivery date: 06/05/24   Verified address: [address] [place] [address]   Medication will be filled on: 06/02/24

## 2024-06-01 LAB — VITAMIN A: Vitamin A: 40.7 ug/dL (ref 22.0–69.5)

## 2024-06-02 ENCOUNTER — Other Ambulatory Visit: Payer: Self-pay

## 2024-06-05 ENCOUNTER — Ambulatory Visit: Payer: Self-pay

## 2024-06-09 NOTE — Telephone Encounter (Signed)
 Pt requesting a c/b to discuss results.

## 2024-06-13 NOTE — Telephone Encounter (Signed)
 Called pt reviewed results pt can't recall what question was.  Advised to call back if remembers.

## 2024-06-14 ENCOUNTER — Other Ambulatory Visit (HOSPITAL_BASED_OUTPATIENT_CLINIC_OR_DEPARTMENT_OTHER): Payer: Self-pay

## 2024-06-14 MED ORDER — SIMVASTATIN 5 MG PO TABS
5.0000 mg | ORAL_TABLET | Freq: Every day | ORAL | 3 refills | Status: AC
  Filled 2024-06-14 – 2024-06-15 (×2): qty 90, 90d supply, fill #0

## 2024-06-15 ENCOUNTER — Ambulatory Visit: Payer: MEDICARE

## 2024-06-15 ENCOUNTER — Other Ambulatory Visit (HOSPITAL_BASED_OUTPATIENT_CLINIC_OR_DEPARTMENT_OTHER): Payer: Self-pay

## 2024-06-15 DIAGNOSIS — I480 Paroxysmal atrial fibrillation: Secondary | ICD-10-CM | POA: Diagnosis not present

## 2024-06-15 LAB — CUP PACEART REMOTE DEVICE CHECK
Battery Remaining Longevity: 154 mo
Battery Voltage: 3.18 V
Brady Statistic AP VP Percent: 14.68 %
Brady Statistic AP VS Percent: 1.63 %
Brady Statistic AS VP Percent: 15.01 %
Brady Statistic AS VS Percent: 68.68 %
Brady Statistic RA Percent Paced: 16.32 %
Brady Statistic RV Percent Paced: 29.69 %
Date Time Interrogation Session: 20260115094551
Implantable Lead Connection Status: 753985
Implantable Lead Connection Status: 753985
Implantable Lead Implant Date: 20250903
Implantable Lead Implant Date: 20250903
Implantable Lead Location: 753859
Implantable Lead Location: 753860
Implantable Lead Model: 3830
Implantable Lead Model: 5076
Implantable Pulse Generator Implant Date: 20250903
Lead Channel Impedance Value: 380 Ohm
Lead Channel Impedance Value: 399 Ohm
Lead Channel Impedance Value: 532 Ohm
Lead Channel Impedance Value: 551 Ohm
Lead Channel Pacing Threshold Amplitude: 0.625 V
Lead Channel Pacing Threshold Amplitude: 0.75 V
Lead Channel Pacing Threshold Pulse Width: 0.4 ms
Lead Channel Pacing Threshold Pulse Width: 0.4 ms
Lead Channel Sensing Intrinsic Amplitude: 24.75 mV
Lead Channel Sensing Intrinsic Amplitude: 24.75 mV
Lead Channel Sensing Intrinsic Amplitude: 3.5 mV
Lead Channel Sensing Intrinsic Amplitude: 3.5 mV
Lead Channel Setting Pacing Amplitude: 1.5 V
Lead Channel Setting Pacing Amplitude: 2 V
Lead Channel Setting Pacing Pulse Width: 0.4 ms
Lead Channel Setting Sensing Sensitivity: 1.2 mV
Zone Setting Status: 755011
Zone Setting Status: 755011

## 2024-06-16 ENCOUNTER — Other Ambulatory Visit (HOSPITAL_BASED_OUTPATIENT_CLINIC_OR_DEPARTMENT_OTHER): Payer: Self-pay

## 2024-06-16 ENCOUNTER — Ambulatory Visit: Payer: Self-pay | Admitting: Cardiovascular Disease

## 2024-06-22 ENCOUNTER — Other Ambulatory Visit: Payer: Self-pay

## 2024-06-23 ENCOUNTER — Other Ambulatory Visit (HOSPITAL_COMMUNITY): Payer: Self-pay

## 2024-06-23 ENCOUNTER — Other Ambulatory Visit (HOSPITAL_BASED_OUTPATIENT_CLINIC_OR_DEPARTMENT_OTHER): Payer: Self-pay

## 2024-06-23 ENCOUNTER — Other Ambulatory Visit: Payer: Self-pay

## 2024-06-23 NOTE — Progress Notes (Signed)
 Remote PPM Transmission

## 2024-06-26 ENCOUNTER — Other Ambulatory Visit: Payer: Self-pay

## 2024-06-26 ENCOUNTER — Other Ambulatory Visit (HOSPITAL_COMMUNITY): Payer: Self-pay

## 2024-06-28 ENCOUNTER — Other Ambulatory Visit: Payer: Self-pay

## 2024-06-28 ENCOUNTER — Other Ambulatory Visit: Payer: Self-pay | Admitting: Pharmacy Technician

## 2024-06-28 NOTE — Progress Notes (Signed)
 Specialty Pharmacy Refill Coordination Note  Deanna [name] is a 79 y.o. female contacted today regarding refills of specialty medication(s) Acoramidis  HCl (Attruby )   Patient requested Delivery   Delivery date: 06/30/24   Verified address: [address] [place] [address]   Medication will be filled on: 06/29/24

## 2024-06-29 ENCOUNTER — Other Ambulatory Visit: Payer: Self-pay

## 2024-07-07 ENCOUNTER — Other Ambulatory Visit (HOSPITAL_BASED_OUTPATIENT_CLINIC_OR_DEPARTMENT_OTHER): Payer: Self-pay

## 2024-08-02 ENCOUNTER — Ambulatory Visit: Payer: MEDICARE | Admitting: Internal Medicine

## 2024-09-05 ENCOUNTER — Ambulatory Visit: Payer: MEDICARE | Admitting: Adult Health
